# Patient Record
Sex: Female | Born: 1975
Health system: Southern US, Community
[De-identification: ages and names within clinical notes are randomized; demographics above are authoritative.]

## PROBLEM LIST (undated history)

## (undated) DIAGNOSIS — K219 Gastro-esophageal reflux disease without esophagitis: Secondary | ICD-10-CM

## (undated) DIAGNOSIS — E785 Hyperlipidemia, unspecified: Secondary | ICD-10-CM

## (undated) DIAGNOSIS — K760 Fatty (change of) liver, not elsewhere classified: Secondary | ICD-10-CM

## (undated) DIAGNOSIS — Z202 Contact with and (suspected) exposure to infections with a predominantly sexual mode of transmission: Secondary | ICD-10-CM

## (undated) DIAGNOSIS — J45909 Unspecified asthma, uncomplicated: Secondary | ICD-10-CM

## (undated) DIAGNOSIS — E119 Type 2 diabetes mellitus without complications: Secondary | ICD-10-CM

## (undated) DIAGNOSIS — B977 Papillomavirus as the cause of diseases classified elsewhere: Secondary | ICD-10-CM

## (undated) DIAGNOSIS — K5792 Diverticulitis of intestine, part unspecified, without perforation or abscess without bleeding: Secondary | ICD-10-CM

## (undated) DIAGNOSIS — I10 Essential (primary) hypertension: Secondary | ICD-10-CM

## (undated) DIAGNOSIS — T7840XA Allergy, unspecified, initial encounter: Secondary | ICD-10-CM

## (undated) DIAGNOSIS — R112 Nausea with vomiting, unspecified: Secondary | ICD-10-CM

## (undated) DIAGNOSIS — M1990 Unspecified osteoarthritis, unspecified site: Secondary | ICD-10-CM

## (undated) DIAGNOSIS — N83209 Unspecified ovarian cyst, unspecified side: Secondary | ICD-10-CM

## (undated) DIAGNOSIS — M797 Fibromyalgia: Secondary | ICD-10-CM

## (undated) DIAGNOSIS — R5382 Chronic fatigue, unspecified: Secondary | ICD-10-CM

## (undated) DIAGNOSIS — R42 Dizziness and giddiness: Secondary | ICD-10-CM

## (undated) DIAGNOSIS — A64 Unspecified sexually transmitted disease: Secondary | ICD-10-CM

## (undated) DIAGNOSIS — F419 Anxiety disorder, unspecified: Secondary | ICD-10-CM

## (undated) DIAGNOSIS — G8929 Other chronic pain: Secondary | ICD-10-CM

## (undated) DIAGNOSIS — E282 Polycystic ovarian syndrome: Secondary | ICD-10-CM

## (undated) DIAGNOSIS — F32A Depression, unspecified: Secondary | ICD-10-CM

## (undated) DIAGNOSIS — F319 Bipolar disorder, unspecified: Secondary | ICD-10-CM

## (undated) DIAGNOSIS — G43909 Migraine, unspecified, not intractable, without status migrainosus: Secondary | ICD-10-CM

## (undated) DIAGNOSIS — F329 Major depressive disorder, single episode, unspecified: Secondary | ICD-10-CM

## (undated) DIAGNOSIS — R87619 Unspecified abnormal cytological findings in specimens from cervix uteri: Secondary | ICD-10-CM

## (undated) HISTORY — PX: GASTRIC BYPASS: SHX52

## (undated) HISTORY — DX: Type 2 diabetes mellitus without complications: E11.9

## (undated) HISTORY — DX: Unspecified abnormal cytological findings in specimens from cervix uteri: R87.619

## (undated) HISTORY — DX: Unspecified sexually transmitted disease: A64

## (undated) HISTORY — DX: Bipolar disorder, unspecified: F31.9

## (undated) HISTORY — DX: Anxiety disorder, unspecified: F41.9

## (undated) HISTORY — DX: Unspecified osteoarthritis, unspecified site: M19.90

## (undated) HISTORY — DX: Other chronic pain: G89.29

## (undated) HISTORY — DX: Gastro-esophageal reflux disease without esophagitis: K21.9

## (undated) HISTORY — DX: Essential (primary) hypertension: I10

## (undated) HISTORY — DX: Papillomavirus as the cause of diseases classified elsewhere: B97.7

## (undated) HISTORY — DX: Polycystic ovarian syndrome: E28.2

## (undated) HISTORY — DX: Unspecified asthma, uncomplicated: J45.909

## (undated) HISTORY — DX: Contact with and (suspected) exposure to infections with a predominantly sexual mode of transmission: Z20.2

## (undated) HISTORY — DX: Allergy, unspecified, initial encounter: T78.40XA

## (undated) HISTORY — DX: Diverticulitis of intestine, part unspecified, without perforation or abscess without bleeding: K57.92

## (undated) HISTORY — PX: KNEE ARTHROSCOPY: SHX127

## (undated) HISTORY — DX: Depression, unspecified: F32.A

## (undated) HISTORY — PX: TOTAL HIP ARTHROPLASTY: SHX124

## (undated) HISTORY — DX: Migraine, unspecified, not intractable, without status migrainosus: G43.909

## (undated) HISTORY — PX: OTHER SURGICAL HISTORY: SHX169

## (undated) HISTORY — DX: Hyperlipidemia, unspecified: E78.5

## (undated) HISTORY — PX: BOWEL RESECTION: SHX1257

---
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
## 2014-02-12 DIAGNOSIS — G4730 Sleep apnea, unspecified: Secondary | ICD-10-CM

## 2014-02-12 HISTORY — PX: GASTRIC BYPASS: SHX52

## 2014-02-12 HISTORY — DX: Sleep apnea, unspecified: G47.30

## 2016-10-06 ENCOUNTER — Encounter: Payer: Self-pay | Admitting: Family Medicine

## 2017-11-02 ENCOUNTER — Emergency Department (HOSPITAL_COMMUNITY)
Admission: EM | Admit: 2017-11-02 | Discharge: 2017-11-03 | Disposition: A | Discharge status: Home / Self Care | Payer: Self-pay | Attending: Emergency Medicine | Admitting: Emergency Medicine

## 2017-11-02 ENCOUNTER — Other Ambulatory Visit: Payer: Self-pay

## 2017-11-02 ENCOUNTER — Encounter (HOSPITAL_COMMUNITY): Payer: Self-pay

## 2017-11-02 DIAGNOSIS — Z7984 Long term (current) use of oral hypoglycemic drugs: Secondary | ICD-10-CM | POA: Insufficient documentation

## 2017-11-02 DIAGNOSIS — N3000 Acute cystitis without hematuria: Secondary | ICD-10-CM | POA: Insufficient documentation

## 2017-11-02 DIAGNOSIS — E119 Type 2 diabetes mellitus without complications: Secondary | ICD-10-CM | POA: Insufficient documentation

## 2017-11-02 DIAGNOSIS — Z79899 Other long term (current) drug therapy: Secondary | ICD-10-CM | POA: Insufficient documentation

## 2017-11-02 HISTORY — DX: Type 2 diabetes mellitus without complications: E11.9

## 2017-11-02 LAB — PREGNANCY, URINE: PREG TEST UR: NEGATIVE

## 2017-11-02 NOTE — ED Triage Notes (Signed)
Patient was treated herself about a week ago for a yeast infection; then followed up with her provider whom prescribed Diflucan. States that the itching is better, but now she is "burning" when voiding.

## 2017-11-02 NOTE — ED Provider Notes (Signed)
[facility] Provider Note   CSN: [id number] Arrival date & time: 11/02/17  2157     History   Chief Complaint Chief Complaint  Patient presents with  . Dysuria    HPI Heidi Holland is a 42 y.o. female.  Patient with history of NIDT2DM presents with painful urination for several days. She states she has had symptoms of yeast infection for the past 10 days that was no better with OTC vaginal creams and Diflucan use. She continues to have vaginal burning without discharge, and dysuria that started 2-3 days ago. She vomited x 1 today and has noticed onset of chills at home. No respiratory symptoms. Her last bowel movement was 4 days ago and she states that is not unusual. No flank pain. She reports mild right lower abdominal/pelvic discomfort for 2-3 days as well.   The history is provided by the patient. No language interpreter was used.    Past Medical History:  Diagnosis Date  . Diabetes mellitus without complication (HCC)     There are no active problems to display for this patient.   Past Surgical History:  Procedure Laterality Date  . BOWEL RESECTION    . GASTRIC BYPASS       OB History   None      Home Medications    Prior to Admission medications   Medication Sig Start Date End Date Taking? Authorizing Provider  acetaminophen (TYLENOL) 325 MG tablet Take 650 mg by mouth every 6 (six) hours as needed for mild pain.   Yes [provider]  butalbital-acetaminophen-caffeine (FIORICET, ESGIC) 50-325-40 MG tablet Take 1 tablet by mouth 2 (two) times daily as needed for headache.   Yes [provider]  metFORMIN (GLUCOPHAGE) 1000 MG tablet Take 1,000 mg by mouth 2 (two) times daily with a meal.   Yes [provider]  misoprostol (CYTOTEC) 100 MCG tablet Take 100 mcg by mouth 4 (four) times daily.   Yes [provider]  montelukast (SINGULAIR) 10 MG tablet Take 10 mg by mouth at bedtime.   Yes  [provider]  pantoprazole (PROTONIX) 40 MG tablet Take 40 mg by mouth every evening.   Yes [provider]  phenazopyridine (PYRIDIUM) 95 MG tablet Take 95 mg by mouth 3 (three) times daily as needed for pain.   Yes [provider]  QUEtiapine (SEROQUEL) 400 MG tablet Take 400 mg by mouth 2 (two) times daily.   Yes [provider]  rosuvastatin (CRESTOR) 40 MG tablet Take 40 mg by mouth daily.   Yes [provider]  SUMAtriptan (IMITREX) 100 MG tablet Take 100 mg by mouth every 2 (two) hours as needed for migraine. May repeat in 2 hours if headache persists or recurs.   Yes [provider]    Family History History reviewed. No pertinent family history.  Social History Social History   Tobacco Use  . Smoking status: Never Smoker  . Smokeless tobacco: Never Used  Substance Use Topics  . Alcohol use: Never    Frequency: Never  . Drug use: Never     Allergies   Ciprofloxacin   Review of Systems Review of Systems  Constitutional: Positive for chills.  HENT: Negative.   Respiratory: Negative.  Negative for cough and shortness of breath.   Cardiovascular: Negative.   Gastrointestinal: Positive for abdominal pain (Mild RLQ/pelvic discomfort), constipation and vomiting.  Genitourinary: Positive for dysuria, frequency, pelvic pain and vaginal pain. Negative for flank pain,  vaginal bleeding and vaginal discharge.  Musculoskeletal: Negative.  Negative for back pain.  Neurological: Negative.  Negative for weakness.     Physical Exam Updated Vital Signs BP (!) 156/81   Pulse 94   Temp 99.2 F (37.3 C) (Oral)   Resp 18   Ht 5\' 3"  (1.6 m)   Wt 95.3 kg   LMP 09/29/2017 (Exact Date)   SpO2 99%   BMI 37.20 kg/m   Physical Exam  Constitutional: She is oriented to person, place, and time. She appears well-developed and well-nourished.  HENT:  Head: Normocephalic.  Neck: Normal range of motion. Neck supple.    Cardiovascular: Normal rate.  Pulmonary/Chest: Effort normal.  Abdominal: Soft. Bowel sounds are normal. There is no tenderness. There is no rebound and no guarding.  Musculoskeletal: Normal range of motion.  Neurological: She is alert and oriented to person, place, and time.  Skin: Skin is warm and dry. No rash noted.  Psychiatric: She has a normal mood and affect.     ED Treatments / Results  Labs (all labs ordered are listed, but only abnormal results are displayed) Labs Reviewed  URINALYSIS, ROUTINE W REFLEX MICROSCOPIC  PREGNANCY, URINE    EKG None  Radiology No results found.  Procedures Procedures (including critical care time)  Medications Ordered in ED Medications - No data to display   Initial Impression / Assessment and Plan / ED Course  I have reviewed the triage vital signs and the nursing notes.  Pertinent labs & imaging results that were available during my care of the patient were reviewed by me and considered in my medical decision making (see chart for details).     Patient here with recent yeast infection now having dysuria, vaginal burning, low grade temperature and vomiting x 1 today. She states her blood sugars have been stable.   UA Is positive for infection. Offered pelvic exam for further evaluation of vaginal burning and patient declined. Feel her symptoms can be explained by finding of UTI. Start on Keflex. Rx Diflucan provided for when she is finished with her antibiotic.   She can be discharged home. Resources for primary care provider provided to patient.   Final Clinical Impressions(s) / ED Diagnoses   Final diagnoses:  None   1. UTI  ED Discharge Orders    None       Elpidio Anis, PA-C 11/03/17 1610    Alvira Monday, MD 11/03/17 1316

## 2017-11-03 LAB — URINALYSIS, ROUTINE W REFLEX MICROSCOPIC
BILIRUBIN URINE: NEGATIVE
Glucose, UA: NEGATIVE mg/dL
KETONES UR: NEGATIVE mg/dL
Nitrite: POSITIVE — AB
PH: 5 (ref 5.0–8.0)
Protein, ur: NEGATIVE mg/dL
SPECIFIC GRAVITY, URINE: 1.018 (ref 1.005–1.030)

## 2017-11-03 LAB — CBG MONITORING, ED: GLUCOSE-CAPILLARY: 142 mg/dL — AB (ref 70–99)

## 2017-11-03 MED ORDER — ONDANSETRON 4 MG PO TBDP
4.0000 mg | ORAL_TABLET | Freq: Once | ORAL | Status: AC
  Administered 2017-11-03: 4 mg via ORAL
  Filled 2017-11-03: qty 1

## 2017-11-03 MED ORDER — FLUCONAZOLE 150 MG PO TABS: 150.0000 mg | ORAL_TABLET | Freq: Once | ORAL | 1 refills | Status: AC

## 2017-11-03 MED ORDER — CEPHALEXIN 500 MG PO CAPS: 1000.0000 mg | ORAL_CAPSULE | Freq: Two times a day (BID) | ORAL | 0 refills | Status: DC

## 2017-11-03 MED ORDER — ONDANSETRON 4 MG PO TBDP: 4.0000 mg | ORAL_TABLET | Freq: Three times a day (TID) | ORAL | 0 refills | Status: DC | PRN

## 2017-11-03 MED ORDER — CEPHALEXIN 250 MG PO CAPS
1000.0000 mg | ORAL_CAPSULE | Freq: Once | ORAL | Status: AC
  Administered 2017-11-03: 1000 mg via ORAL
  Filled 2017-11-03: qty 4

## 2017-11-03 MED ORDER — KETOROLAC TROMETHAMINE 30 MG/ML IJ SOLN
30.0000 mg | Freq: Once | INTRAMUSCULAR | Status: AC
  Administered 2017-11-03: 30 mg via INTRAMUSCULAR
  Filled 2017-11-03: qty 1

## 2017-11-03 NOTE — ED Notes (Signed)
Reviewed d/c instructions with pt, who verbalized understanding and had no outstanding questions. Pt departed in NAD, refused use of wheelchair.   

## 2017-11-03 NOTE — Discharge Instructions (Signed)
Take Keflex twice daily until gone for urinary tract infection. Wait until you have finished the Keflex to take the Diflucan if you develop symptoms of yeast infection. Follow up with your choice of primary care providers for routine medical management. A list of providers has been provided for your convenience. Return to the emergency department with any worsening symptoms or new concerns.

## 2018-03-28 ENCOUNTER — Emergency Department (HOSPITAL_COMMUNITY)
Admission: EM | Admit: 2018-03-28 | Discharge: 2018-03-29 | Disposition: A | Discharge status: Home / Self Care | Payer: BLUE CROSS/BLUE SHIELD | Attending: Emergency Medicine | Admitting: Emergency Medicine

## 2018-03-28 ENCOUNTER — Emergency Department (HOSPITAL_COMMUNITY): Payer: BLUE CROSS/BLUE SHIELD

## 2018-03-28 ENCOUNTER — Other Ambulatory Visit: Payer: Self-pay

## 2018-03-28 DIAGNOSIS — F410 Panic disorder [episodic paroxysmal anxiety] without agoraphobia: Secondary | ICD-10-CM | POA: Diagnosis not present

## 2018-03-28 DIAGNOSIS — Z79899 Other long term (current) drug therapy: Secondary | ICD-10-CM | POA: Insufficient documentation

## 2018-03-28 DIAGNOSIS — E119 Type 2 diabetes mellitus without complications: Secondary | ICD-10-CM | POA: Diagnosis not present

## 2018-03-28 DIAGNOSIS — R0789 Other chest pain: Secondary | ICD-10-CM | POA: Diagnosis not present

## 2018-03-28 DIAGNOSIS — Z7984 Long term (current) use of oral hypoglycemic drugs: Secondary | ICD-10-CM | POA: Diagnosis not present

## 2018-03-28 DIAGNOSIS — R079 Chest pain, unspecified: Secondary | ICD-10-CM

## 2018-03-28 LAB — BASIC METABOLIC PANEL
ANION GAP: 10 (ref 5–15)
BUN: 10 mg/dL (ref 6–20)
CO2: 22 mmol/L (ref 22–32)
Calcium: 9.8 mg/dL (ref 8.9–10.3)
Chloride: 105 mmol/L (ref 98–111)
Creatinine, Ser: 0.68 mg/dL (ref 0.44–1.00)
GFR calc Af Amer: >60 mL/min (ref >60)
Glucose, Bld: 191 mg/dL — ABNORMAL HIGH (ref 70–99)
POTASSIUM: 4.2 mmol/L (ref 3.5–5.1)
SODIUM: 137 mmol/L (ref 135–145)

## 2018-03-28 LAB — CBC
HCT: 35.3 % — ABNORMAL LOW (ref 36.0–46.0)
HEMOGLOBIN: 10.4 g/dL — AB (ref 12.0–15.0)
MCH: 20.8 pg — ABNORMAL LOW (ref 26.0–34.0)
MCHC: 29.5 g/dL — ABNORMAL LOW (ref 30.0–36.0)
MCV: 70.5 fL — ABNORMAL LOW (ref 80.0–100.0)
Platelets: 452 10*3/uL — ABNORMAL HIGH (ref 150–400)
RBC: 5.01 MIL/uL (ref 3.87–5.11)
RDW: 16.1 % — ABNORMAL HIGH (ref 11.5–15.5)
WBC: 13.3 10*3/uL — AB (ref 4.0–10.5)
nRBC: 0 % (ref 0.0–0.2)

## 2018-03-28 LAB — I-STAT BETA HCG BLOOD, ED (MC, WL, AP ONLY): I-stat hCG, quantitative: <5 m[IU]/mL (ref <5)

## 2018-03-28 LAB — I-STAT TROPONIN, ED: TROPONIN I, POC: 0 ng/mL (ref 0.00–0.08)

## 2018-03-28 MED ORDER — SODIUM CHLORIDE 0.9% FLUSH: 3.0000 mL | Freq: Once | INTRAVENOUS | Status: DC

## 2018-03-28 NOTE — ED Triage Notes (Signed)
Pt states she has had non radiating chest pain and shob since yesterday. Non radiating. Some nausea. Tearful.

## 2018-03-29 LAB — I-STAT TROPONIN, ED: TROPONIN I, POC: 0.01 ng/mL (ref 0.00–0.08)

## 2018-03-29 MED ORDER — HYDROXYZINE HCL 25 MG PO TABS: 25.0000 mg | ORAL_TABLET | Freq: Four times a day (QID) | ORAL | 0 refills | Status: DC

## 2018-03-29 MED ORDER — LORAZEPAM 1 MG PO TABS
1.0000 mg | ORAL_TABLET | Freq: Once | ORAL | Status: AC
  Administered 2018-03-29: 1 mg via ORAL
  Filled 2018-03-29: qty 1

## 2018-03-29 NOTE — ED Provider Notes (Signed)
[facility] EMERGENCY DEPARTMENT Provider Note   CSN: [id number] Arrival date & time: 03/28/18  2223     History   Chief Complaint Chief Complaint  Patient presents with  . Chest Pain    HPI Heidi Holland is a 43 y.o. female.  Patient presents to the emergency department with a chief complaint of chest pain or shortness of breath.  She states that the symptoms have happened 3 times over the past 2 days.  She states that she has anxiety, and states that this feels similar to panic attack.  States that she has not been sleeping well has been having night terrors.  She denies any chest pain or shortness of breath now.  She states that she has been off of her psych meds since moving to [facility], she is trying to reestablish care here.  The history is provided by the patient. No language interpreter was used.    Past Medical History:  Diagnosis Date  . Diabetes mellitus without complication (HCC)     There are no active problems to display for this patient.   Past Surgical History:  Procedure Laterality Date  . BOWEL RESECTION    . GASTRIC BYPASS       OB History   No obstetric history on file.      Home Medications    Prior to Admission medications   Medication Sig Start Date End Date Taking? Authorizing Provider  acetaminophen (TYLENOL) 325 MG tablet Take 650 mg by mouth every 6 (six) hours as needed for mild pain.    [provider]  butalbital-acetaminophen-caffeine (FIORICET, ESGIC) 50-325-40 MG tablet Take 1 tablet by mouth 2 (two) times daily as needed for headache.    [provider]  cephALEXin (KEFLEX) 500 MG capsule Take 2 capsules (1,000 mg total) by mouth 2 (two) times daily. 11/03/17   Elpidio Anis, PA-C  metFORMIN (GLUCOPHAGE) 1000 MG tablet Take 1,000 mg by mouth 2 (two) times daily with a meal.    [provider]  misoprostol (CYTOTEC) 100 MCG tablet Take 100 mcg by mouth 4 (four) times daily.    [provider]  montelukast (SINGULAIR) 10 MG tablet Take 10 mg by mouth at bedtime.    [provider]  ondansetron (ZOFRAN ODT) 4 MG disintegrating tablet Take 1 tablet (4 mg total) by mouth every 8 (eight) hours as needed for nausea or vomiting. 11/03/17   Elpidio Anis, PA-C  pantoprazole (PROTONIX) 40 MG tablet Take 40 mg by mouth every evening.    [provider]  phenazopyridine (PYRIDIUM) 95 MG tablet Take 95 mg by mouth 3 (three) times daily as needed for pain.    [provider]  QUEtiapine (SEROQUEL) 400 MG tablet Take 400 mg by mouth 2 (two) times daily.    [provider]  rosuvastatin (CRESTOR) 40 MG tablet Take 40 mg by mouth daily.    [provider]  SUMAtriptan (IMITREX) 100 MG tablet Take 100 mg by mouth every 2 (two) hours as needed for migraine. May repeat in 2 hours if headache persists or recurs.    [provider]    Family History No family history on file.  Social History Social History   Tobacco Use  . Smoking status: Never Smoker  . Smokeless tobacco: Never Used  Substance Use Topics  . Alcohol use: Never    Frequency: Never  . Drug use: Never     Allergies   Ciprofloxacin   Review  of Systems Review of Systems  All other systems reviewed and are negative.    Physical Exam Updated Vital Signs BP (!) 141/91   Pulse 90   Temp 98.2 F (36.8 C) (Oral)   Resp 13   SpO2 100%   Physical Exam Vitals signs and nursing note reviewed.  Constitutional:      Appearance: She is well-developed.  HENT:     Head: Normocephalic and atraumatic.  Eyes:     Conjunctiva/sclera: Conjunctivae normal.     Pupils: Pupils are equal, round, and reactive to light.  Neck:     Musculoskeletal: Normal range of motion and neck supple.  Cardiovascular:     Rate and Rhythm: Normal rate and regular rhythm.     Heart sounds: No murmur. No friction rub. No gallop.   Pulmonary:     Effort: Pulmonary effort is  normal. No respiratory distress.     Breath sounds: Normal breath sounds. No wheezing or rales.  Chest:     Chest wall: No tenderness.  Abdominal:     General: Bowel sounds are normal. There is no distension.     Palpations: Abdomen is soft. There is no mass.     Tenderness: There is no abdominal tenderness. There is no guarding or rebound.  Musculoskeletal: Normal range of motion.        General: No tenderness.  Skin:    General: Skin is warm and dry.  Neurological:     Mental Status: She is alert and oriented to person, place, and time.  Psychiatric:        Behavior: Behavior normal.        Thought Content: Thought content normal.        Judgment: Judgment normal.      ED Treatments / Results  Labs (all labs ordered are listed, but only abnormal results are displayed) Labs Reviewed  BASIC METABOLIC PANEL - Abnormal; Notable for the following components:      Result Value   Glucose, Bld 191 (*)    All other components within normal limits  CBC - Abnormal; Notable for the following components:   WBC 13.3 (*)    Hemoglobin 10.4 (*)    HCT 35.3 (*)    MCV 70.5 (*)    MCH 20.8 (*)    MCHC 29.5 (*)    RDW 16.1 (*)    Platelets 452 (*)    All other components within normal limits  I-STAT TROPONIN, ED  I-STAT BETA HCG BLOOD, ED ([facility], [facility], [facility] ONLY)  I-STAT TROPONIN, ED    EKG EKG Interpretation  Date/Time:  Friday March 28 2018 22:30:11 EST Ventricular Rate:  104 PR Interval:  140 QRS Duration: 68 QT Interval:  330 QTC Calculation: 433 R Axis:   59 Text Interpretation:  Sinus tachycardia Otherwise normal ECG No previous ECGs available Confirmed by Zadie RhineWickline, Donald ([id number]) on 03/29/2018 2:47:51 AM   Radiology Dg Chest 2 View  Result Date: 03/28/2018 CLINICAL DATA:  Nonradiating chest pain and dyspnea since yesterday. EXAM: CHEST - 2 VIEW COMPARISON:  None. FINDINGS: Cardiomegaly with mild aortic atherosclerosis. No acute pulmonary edema, consolidation, effusion or  pneumothorax. No acute nor suspicious osseous abnormality. IMPRESSION: Cardiomegaly with mild aortic atherosclerosis. No active pulmonary disease. Electronically Signed   By: Tollie Ethavid  Kwon M.D.   On: 03/28/2018 23:04    Procedures Procedures (including critical care time)  Medications Ordered in ED Medications  sodium chloride flush (NS) 0.9 % injection 3 mL (has no administration  in time range)     Initial Impression / Assessment and Plan / ED Course  I have reviewed the triage vital signs and the nursing notes.  Pertinent labs & imaging results that were available during my care of the patient were reviewed by me and considered in my medical decision making (see chart for details).     Patient was symptoms that seem consistent with panic attacks.  She has had 3 episodes over the past 2 days.  She is also not been sleeping well has been having night terrors, and has felt anxious.  She has been off of her medications for a couple of months.  I have encouraged her to follow-up at [facility].  I have given her a dose of 1 mg of Ativan here for her symptoms and will discharge her home with some Atarax.  I do not feel that there is any ACS component of her symptoms.  Troponin and delta troponin are negative.  Laboratory work-up is remarkable.  She does have a nonspecific leukocytosis and mild anemia.  Final Clinical Impressions(s) / ED Diagnoses   Final diagnoses:  Panic attack  Nonspecific chest pain    ED Discharge Orders         Ordered    hydrOXYzine (ATARAX/VISTARIL) 25 MG tablet  Every 6 hours     03/29/18 0418           Roxy Horseman, PA-C 03/29/18 Shonna Chock, MD 03/29/18 [phone]

## 2018-03-29 NOTE — ED Notes (Signed)
This RN out to lobby to reassure pt. Pt is working on controlling her breathing and feels better at this time. Breathing controlled. Reassured.

## 2018-03-30 DIAGNOSIS — F411 Generalized anxiety disorder: Secondary | ICD-10-CM | POA: Diagnosis not present

## 2018-04-01 DIAGNOSIS — F4310 Post-traumatic stress disorder, unspecified: Secondary | ICD-10-CM | POA: Diagnosis not present

## 2018-04-04 DIAGNOSIS — I10 Essential (primary) hypertension: Secondary | ICD-10-CM | POA: Diagnosis not present

## 2018-04-04 DIAGNOSIS — E782 Mixed hyperlipidemia: Secondary | ICD-10-CM | POA: Diagnosis not present

## 2018-04-04 DIAGNOSIS — E1169 Type 2 diabetes mellitus with other specified complication: Secondary | ICD-10-CM | POA: Diagnosis not present

## 2018-04-04 DIAGNOSIS — F4310 Post-traumatic stress disorder, unspecified: Secondary | ICD-10-CM | POA: Diagnosis not present

## 2018-04-04 DIAGNOSIS — Z23 Encounter for immunization: Secondary | ICD-10-CM | POA: Diagnosis not present

## 2018-04-04 DIAGNOSIS — Z9884 Bariatric surgery status: Secondary | ICD-10-CM | POA: Diagnosis not present

## 2018-04-04 DIAGNOSIS — F419 Anxiety disorder, unspecified: Secondary | ICD-10-CM | POA: Diagnosis not present

## 2018-04-11 DIAGNOSIS — F419 Anxiety disorder, unspecified: Secondary | ICD-10-CM | POA: Diagnosis not present

## 2018-04-11 DIAGNOSIS — F332 Major depressive disorder, recurrent severe without psychotic features: Secondary | ICD-10-CM | POA: Diagnosis not present

## 2018-04-11 DIAGNOSIS — E782 Mixed hyperlipidemia: Secondary | ICD-10-CM | POA: Diagnosis not present

## 2018-04-11 DIAGNOSIS — E1169 Type 2 diabetes mellitus with other specified complication: Secondary | ICD-10-CM | POA: Diagnosis not present

## 2018-04-14 ENCOUNTER — Encounter: Payer: Self-pay | Admitting: Neurology

## 2018-04-16 DIAGNOSIS — F4310 Post-traumatic stress disorder, unspecified: Secondary | ICD-10-CM | POA: Diagnosis not present

## 2018-04-28 DIAGNOSIS — F4310 Post-traumatic stress disorder, unspecified: Secondary | ICD-10-CM | POA: Diagnosis not present

## 2018-04-30 DIAGNOSIS — F3341 Major depressive disorder, recurrent, in partial remission: Secondary | ICD-10-CM | POA: Diagnosis not present

## 2018-04-30 DIAGNOSIS — F4310 Post-traumatic stress disorder, unspecified: Secondary | ICD-10-CM | POA: Diagnosis not present

## 2018-04-30 DIAGNOSIS — F419 Anxiety disorder, unspecified: Secondary | ICD-10-CM | POA: Diagnosis not present

## 2018-05-02 ENCOUNTER — Other Ambulatory Visit: Payer: Self-pay

## 2018-05-02 ENCOUNTER — Ambulatory Visit (INDEPENDENT_AMBULATORY_CARE_PROVIDER_SITE_OTHER): Payer: BLUE CROSS/BLUE SHIELD | Admitting: Neurology

## 2018-05-02 ENCOUNTER — Encounter: Payer: Self-pay | Admitting: Neurology

## 2018-05-02 VITALS — BP 100/70 | HR 75 | Temp 98.2°F | Ht 63.0 in | Wt 211.2 lb

## 2018-05-02 DIAGNOSIS — G43709 Chronic migraine without aura, not intractable, without status migrainosus: Secondary | ICD-10-CM

## 2018-05-02 DIAGNOSIS — G8929 Other chronic pain: Secondary | ICD-10-CM | POA: Diagnosis not present

## 2018-05-02 DIAGNOSIS — M545 Low back pain, unspecified: Secondary | ICD-10-CM

## 2018-05-02 DIAGNOSIS — M542 Cervicalgia: Secondary | ICD-10-CM

## 2018-05-02 MED ORDER — ERENUMAB-AOOE 70 MG/ML ~~LOC~~ SOAJ: 70.0000 mg | SUBCUTANEOUS | 11 refills | Status: DC

## 2018-05-02 MED ORDER — TIZANIDINE HCL 2 MG PO TABS: 2.0000 mg | ORAL_TABLET | ORAL | 0 refills | Status: DC

## 2018-05-02 MED ORDER — ERENUMAB-AOOE 70 MG/ML ~~LOC~~ SOAJ: 70.0000 mg | Freq: Once | SUBCUTANEOUS | 0 refills | Status: AC

## 2018-05-02 MED ORDER — TIZANIDINE HCL 2 MG PO TABS: ORAL_TABLET | ORAL | 3 refills | Status: DC

## 2018-05-02 MED ORDER — SUMATRIPTAN 10 MG/ACT NA SOLN: 1.0000 | NASAL | 3 refills | Status: DC | PRN

## 2018-05-02 MED ORDER — SUMATRIPTAN 10 MG/ACT NA SOLN: 1.0000 | NASAL | 0 refills | Status: DC

## 2018-05-02 MED ORDER — ONDANSETRON 4 MG PO TBDP: 4.0000 mg | ORAL_TABLET | Freq: Three times a day (TID) | ORAL | 0 refills | Status: DC | PRN

## 2018-05-02 MED ORDER — SUMATRIPTAN 10 MG/ACT NA SOLN: 1.0000 | NASAL | 1 refills | Status: DC | PRN

## 2018-05-02 NOTE — Progress Notes (Addendum)
NEUROLOGY CONSULTATION NOTE  Heidi Holland MRN: [id number] DOB: 05-30-1975  Referring provider: Levonne Lapping, NP Primary care provider: Levonne Lapping, NP  Reason for consult:  headache  HISTORY OF PRESENT ILLNESS: Heidi Holland is a 43 year old right-handed Caucasian woman with hypertension, hyperlipidemia, type 2 diabetes, PTSD/anxiety/depression, chronic low back pain and history of TIA who presents for headaches.  History supplemented by referring provider note.  Onset:  Age 7 Location: occiput into neck or top of head Quality:  Pressure, throbbing Intensity:  8/10.  She denies new headache, thunderclap headache  Aura:  no Premonitory Phase:  Wakes up with heavy eyelids and stiff neck. Postdrome:  "hangover effect" - exhausted, irritable, neck soreness Associated symptoms:  Nausea, sometimes vomiting, photophobia, phonophobia, blurred vision.  She denies associated unilateral numbness or weakness. Duration:  Usually 2-3 days (up to a week with various intensity) Frequency:  6 times a month (15 days or more a month) Frequency of abortive medication: something daily Triggers:  Lack of coffee, menstrual period, when low back pain aggravated, perfumes Relieving factors:  Ice pack, lay in dark Activity:  aggravates She has been to the ED on a couple of occasions for severe intractable migraine.  Rescue therapy:  1.  Advil, 2.  Fioricet three hours later, 3. Maxalt the next day Current NSAIDS:  ASA 325mg  Current analgesics:  Fioricet Current triptans:  Rizatriptan 10mg  Current ergotamine:  none Current anti-emetic:  none Current muscle relaxants:  Tizanidine 4mg  PRN Current anti-anxiolytic:  Clonazepam, hydroxyzine Current sleep aide:  trazodone Current Antihypertensive medications:  hydroxyzine Current Antidepressant medications:  Trintellix, Rexulti Current Anticonvulsant medications:  Gabapentin 600mg  three times daily Current anti-CGRP:  none Current  Vitamins/Herbal/Supplements:  none Current Antihistamines/Decongestants:  none Other therapy:  none Hormone/birth control:  none Other medications:  Seroquel  Past NSAIDS:  Ibuprofen, Aleve Past analgesics:  Excedrin, Tylenol Past abortive triptans:  Sumatriptan tablet Past abortive ergotamine:  none Past muscle relaxants:  Flexeril, Robaxin Past anti-emetic:  Zofran, Phenergan Past antihypertensive medications:  "a blood pressure medication" Past antidepressant medications:  Effexor Past anticonvulsant medications:  topiramate Past anti-CGRP:  None Past vitamins/Herbal/Supplements:  none Past antihistamines/decongestants:  none Other past therapies:  Botox (effective)  She was told by her previous PCP that she had a transient ischemic attack in early 2019.  She was walking and her legs suddenly gave out and her arms weren't working.  She was on the floor for a few minutes.  No unilateral numbness or weakness.  When she got up, she called a friend who tested her on the phone for TIA and symptoms resolved.  She had an MRI and MRA of the brain about a month later, on 07/24/17, which report states were normal.  She was given a diagnosis of TIA and has since been on ASA 325mg  daily.  Her mother has a history of recurrent TIAs while she was treated for lung cancer.  Caffeine:  1 venti cup of coffee daily, 2 cups of diet Coke daily Alcohol:  no Smoker:  no Diet:  Needs to improve water intake.  Skips meals.  Diet Coke daily Exercise:  Not routine Depression:  Poor.  Currently treated; Anxiety:  Poor.  Currently treated. Other pain:  Low back pain/lumbar radiculopathy Sleep hygiene:  Improved on medication Family history of headache:  no  04/04/18 LABS:  CBC with WBC 9.5, HGB 10.4, HCT 32.5, PLT 423  PAST MEDICAL HISTORY: Type 2 diabetes mellitus Hypertension Migraine PTSD/anxiety/depression Asthma, seasonally  Low  back pain  PAST SURGICAL HISTORY: Past Surgical History:  Procedure  Laterality Date  . BOWEL RESECTION    . GASTRIC BYPASS      MEDICATIONS: Clonazepam 0.5mg  Gabapentin 300mg  Hydroxyzine 25mg  Quetiapine ER 400mg   ALLERGIES: Allergies  Allergen Reactions  . Ciprofloxacin Itching    FAMILY HISTORY: Father:  Small cell lung cancer, CAD Mother:  Stage 4 lung cancer Paternal grandmother:  PE Maternal grandfather: lung cancer Maternal grandmother:  diabetes  SOCIAL HISTORY: Social History   Socioeconomic History  . Marital status: Single    Spouse name: Not on file  . Number of children: Not on file  . Years of education: Not on file  . Highest education level: Not on file  Occupational History  . Not on file  Social Needs  . Financial resource strain: Not on file  . Food insecurity:    Worry: Not on file    Inability: Not on file  . Transportation needs:    Medical: Not on file    Non-medical: Not on file  Tobacco Use  . Smoking status: Never Smoker  . Smokeless tobacco: Never Used  Substance and Sexual Activity  . Alcohol use: Never    Frequency: Never  . Drug use: Never  . Sexual activity: Never  Lifestyle  . Physical activity:    Days per week: Not on file    Minutes per session: Not on file  . Stress: Not on file  Relationships  . Social connections:    Talks on phone: Not on file    Gets together: Not on file    Attends religious service: Not on file    Active member of club or organization: Not on file    Attends meetings of clubs or organizations: Not on file    Relationship status: Not on file  . Intimate partner violence:    Fear of current or ex partner: Not on file    Emotionally abused: Not on file    Physically abused: Not on file    Forced sexual activity: Not on file  Other Topics Concern  . Not on file  Social History Narrative  . Not on file    REVIEW OF SYSTEMS: Constitutional: No fevers, chills, or sweats, no generalized fatigue, change in appetite Eyes: No visual changes, double vision, eye  pain Ear, nose and throat: No hearing loss, ear pain, nasal congestion, sore throat Cardiovascular: No chest pain, palpitations Respiratory:  No shortness of breath at rest or with exertion, wheezes GastrointestinaI: No nausea, vomiting, diarrhea, abdominal pain, fecal incontinence Genitourinary:  No dysuria, urinary retention or frequency Musculoskeletal:  No neck pain, back pain Integumentary: No rash, pruritus, skin lesions Neurological: as above Psychiatric: No depression, insomnia, anxiety Endocrine: No palpitations, fatigue, diaphoresis, mood swings, change in appetite, change in weight, increased thirst Hematologic/Lymphatic:  No purpura, petechiae. Allergic/Immunologic: no itchy/runny eyes, nasal congestion, recent allergic reactions, rashes  PHYSICAL EXAM: Blood pressure 100/70, pulse 75, temperature 98.2 F (36.8 C), temperature source Oral, height 5\' 3"  (1.6 m), weight 211 lb 4 oz (95.8 kg), SpO2 97 %. General: No acute distress.  Patient appears well-groomed Head:  Normocephalic/atraumatic Eyes:  fundi examined but not visualized Neck: supple, no paraspinal tenderness, full range of motion Back: No paraspinal tenderness Heart: regular rate and rhythm Lungs: Clear to auscultation bilaterally. Vascular: No carotid bruits. Neurological Exam: Mental status: alert and oriented to person, place, and time, recent and remote memory intact, fund of knowledge intact, attention and concentration intact,  speech fluent and not dysarthric, language intact. Cranial nerves: CN I: not tested CN II: pupils equal, round and reactive to light, visual fields intact CN III, IV, VI:  full range of motion, no nystagmus, no ptosis CN V: facial sensation intact CN VII: upper and lower face symmetric CN VIII: hearing intact CN IX, X: gag intact, uvula midline CN XI: sternocleidomastoid and trapezius muscles intact CN XII: tongue midline Bulk & Tone: normal, no fasciculations. Motor:  5/5  throughout Sensation:  temperature and vibration sensation intact. Deep Tendon Reflexes:  2+ throughout,toes downgoing.   Finger to nose testing:  Without dysmetria.   Heel to shin:  Without dysmetria.   Gait:  Normal station and stride.  Able to turn and tandem walk. Romberg negative  IMPRESSION: 1.  Chronic migraine without aura, without status migrainosus, not intractable 2.  Reported history of TIA.  Based on her description of event, I don't believe that she had a TIA.  Etiology is unclear.  PLAN: 1.  Botox was previously effective but for preventative management, we will first try Aimovig  monthly 2.  Stop Fioricet, Maxalt, and Advil.  For abortive therapy, Tosymra nasal spray.  Zofran  for nausea. 3. For neck pain, tizanidine  in AM and afternoon PRN (caution for drowsiness advised) and  at bedtime. 4.  Limit use of pain relievers to no more than 2 days out of week to prevent risk of rebound or medication-overuse headache. 5.  Keep headache diary 6.  Exercise, hydration, caffeine cessation, sleep hygiene, monitor for and avoid triggers 7.  Consider:  magnesium citrate  daily, riboflavin  daily, and coenzyme Q10  three times daily 8. Will refer for osteopathic manipulative medicine/Sports Medicine for neck pain.  Refer to pain management for chronic back pain  9. As I do not believe that she had a TIA, she may discontinue ASA. 10. Follow up in 4 months.  Thank you for allowing me to take part in the care of this patient.  Shon Millet, DO  CC: Levonne Lapping, NP

## 2018-05-02 NOTE — Patient Instructions (Addendum)
Migraine Recommendations: 1.  Start Aimovig 70mg  injection monthly 2.  Take Tosymra 1 spray at earliest onset of headache.  May repeat dose every 1 hour if needed.  Do not exceed 3 sprays in 24 hours.  Ondansetron 4mg  for nausea as directed. 3.  Limit use of pain relievers to no more than 2 days out of the week.  These medications include acetaminophen, ibuprofen, triptans and narcotics.  This will help reduce risk of rebound headaches. 4.  Be aware of common food triggers such as processed sweets, processed foods with nitrites (such as deli meat, hot dogs, sausages), foods with MSG, alcohol (such as wine), chocolate, certain cheeses, certain fruits (dried fruits, bananas, pineapple), vinegar, diet soda. 4.  Avoid caffeine 5.  Routine exercise 6.  Proper sleep hygiene 7.  Stay adequately hydrated with water 8.  Keep a headache diary. 9.  Maintain proper stress management. 10.  Do not skip meals. 11.  Consider supplements:  Magnesium citrate 400mg  to 600mg  daily, riboflavin 400mg , Coenzyme Q 10 100mg  three times daily 12.  STOP FIORICET, RIZATRIPTAN, ADVIL 13.  For neck pain, may take tizanidine 2mg  in AM and afternoon (caution for drowsiness).  Take 4mg  at bedtime. 14.  Refer to Dr. Antoine Primas or Dr Gaspar Bidding for neck pain 15.  Refer to pain management for low back pain 16.  Follow up in 4 months. 17.  I don't think you had TIA.  May stop aspirin

## 2018-05-05 DIAGNOSIS — E559 Vitamin D deficiency, unspecified: Secondary | ICD-10-CM | POA: Diagnosis not present

## 2018-05-05 DIAGNOSIS — F419 Anxiety disorder, unspecified: Secondary | ICD-10-CM | POA: Diagnosis not present

## 2018-05-05 DIAGNOSIS — E1169 Type 2 diabetes mellitus with other specified complication: Secondary | ICD-10-CM | POA: Diagnosis not present

## 2018-05-05 DIAGNOSIS — F4310 Post-traumatic stress disorder, unspecified: Secondary | ICD-10-CM | POA: Diagnosis not present

## 2018-05-05 DIAGNOSIS — F332 Major depressive disorder, recurrent severe without psychotic features: Secondary | ICD-10-CM | POA: Diagnosis not present

## 2018-05-11 NOTE — Progress Notes (Signed)
[facility] [address] [place], [place] [address] Phone: [phone] Subjective:    I'm seeing this patient by the request  of:  Jaffe DO   CC: Neck and back pain   [id number]  Heidi Holland is a 43 y.o. female coming in with complaint of neck and back pain.  Past medical history significant for PTSD, anxiety and depression with chronic neck and back pain.  Did see neurology for also her chronic headaches.  Has had these headaches for 25 years. Pain radiates to the right hip. Has had injections that she believes made her pain worse. Was getting Botox for migraines in the cervical spine. Numbness and burning sensation in the thigh on the left side. Not supposed to take NSAIDs due to gastric bypass.  Patient has had what appeared to be facet injections based on the picture she showed me.  Onset- chronic Location lower back as well as neck Duration-  Character-dull, throbbing aching Aggravating factors- sitting Reliving factors- Ice, heat, topical, Zanaflex  Therapies tried-  Severity-8 out of 10.   Last plan with neurology  1.  Botox was previously effective but for preventative management, we will first try Aimovig  monthly 2.  Stop Fioricet, Maxalt, and Advil.  For abortive therapy, Tosymra nasal spray.  Zofran  for nausea. 3. For neck pain, tizanidine  in AM and afternoon PRN (caution for drowsiness advised) and  at bedtime. 4.  Limit use of pain relievers to no more than 2 days out of week to prevent risk of rebound or medication-overuse headache. 5.  Keep headache diary 6.  Exercise, hydration, caffeine cessation, sleep hygiene, monitor for and avoid triggers 7.  Consider:  magnesium citrate  daily, riboflavin  daily, and coenzyme Q10  three times daily 8. Will refer for osteopathic manipulative medicine/Sports Medicine for neck pain.  Refer to pain management for chronic back pain  9. As I do not believe that she  had a TIA, she may discontinue ASA. 10. Follow up in 4 months. Past Medical History:  Diagnosis Date  . Diabetes mellitus without complication [facility])    Past Surgical History:  Procedure Laterality Date  . BOWEL RESECTION    . GASTRIC BYPASS     Social History   Socioeconomic History  . Marital status: Single    Spouse name: Not on file  . Number of children: 0  . Years of education: 65  . Highest education level: Associate degree: occupational, Scientist, product/process development, or vocational program  Occupational History    Employer: [employer]  Social Needs  . Financial resource strain: Not on file  . Food insecurity:    Worry: Not on file    Inability: Not on file  . Transportation needs:    Medical: Not on file    Non-medical: Not on file  Tobacco Use  . Smoking status: Never Smoker  . Smokeless tobacco: Never Used  Substance and Sexual Activity  . Alcohol use: Never    Frequency: Never  . Drug use: Never  . Sexual activity: Never  Lifestyle  . Physical activity:    Days per week: Not on file    Minutes per session: Not on file  . Stress: Not on file  Relationships  . Social connections:    Talks on phone: Not on file    Gets together: Not on file    Attends religious service: Not on file    Active member of club or organization: Not on file  Attends meetings of clubs or organizations: Not on file    Relationship status: Not on file  Other Topics Concern  . Not on file  Social History Narrative   Right handed.    Allergies  Allergen Reactions  . Ciprofloxacin Itching   History reviewed. No pertinent family history.  Current Outpatient Medications (Endocrine & Metabolic):  .  metFORMIN (GLUCOPHAGE) 1000 MG tablet, Take 1,000 mg by mouth daily with breakfast.   Current Outpatient Medications (Cardiovascular):  .  rosuvastatin (CRESTOR) 40 MG tablet, Take 40 mg by mouth daily.  Current Outpatient Medications (Respiratory):  .  cetirizine (ZYRTEC) 10 MG tablet,  .   montelukast (SINGULAIR) 10 MG tablet, Take 10 mg by mouth at bedtime.  Current Outpatient Medications (Analgesics):  .  acetaminophen (TYLENOL) 325 MG tablet, Take 650 mg by mouth every 6 (six) hours as needed for mild pain. Dorise Hiss (AIMOVIG) 70 MG/ML SOAJ, Inject 70 mg into the skin every 30 (thirty) days. Dorise Hiss (AIMOVIG) 70 MG/ML SOAJ, Inject 70 mg into the skin every 30 (thirty) days. .  SUMAtriptan (TOSYMRA) 10 MG/ACT SOLN, Place 1 spray into the nose every hour as needed (Maximum 3 sprays in 24 hours). .  SUMAtriptan (TOSYMRA) 10 MG/ACT SOLN, Place 1 spray into the nose as directed. .  SUMAtriptan (TOSYMRA) 10 MG/ACT SOLN, Place 1 spray into the nose as needed. Place 1 spray into the nose every hour as needed (Maximum 3 sprays in 24 hours).   Current Outpatient Medications (Other):  .  clonazePAM (KLONOPIN) 1 MG tablet, TAKE 1 TABLET BY MOUTH AS NEEDED ANXIETY .  gabapentin (NEURONTIN) 600 MG tablet, 3 (three) times daily.  .  hydrOXYzine (ATARAX/VISTARIL) 25 MG tablet, Take 1 tablet (25 mg total) by mouth every 6 (six) hours. .  ondansetron (ZOFRAN ODT) 4 MG disintegrating tablet, Take 1 tablet (4 mg total) by mouth every 8 (eight) hours as needed for nausea or vomiting. .  ondansetron (ZOFRAN ODT) 4 MG disintegrating tablet, Take 1 tablet (4 mg total) by mouth every 8 (eight) hours as needed for nausea or vomiting. .  pantoprazole (PROTONIX) 40 MG tablet, Take 40 mg by mouth every evening. Marland Kitchen  QUEtiapine (SEROQUEL XR) 400 MG 24 hr tablet, every evening. Marland Kitchen  REXULTI 0.5 MG TABS, Take 1 tablet by mouth daily. Marland Kitchen  tiZANidine (ZANAFLEX) 2 MG tablet, Take 1 tablet in AM and afternoon PRN and 2 tablets at bedtime. Marland Kitchen  tiZANidine (ZANAFLEX) 2 MG tablet, Take 1 tablet (2 mg total) by mouth as directed. Take 1 tablet in AM and afternoon PRN and 2 tablets at bedtime. .  traZODone (DESYREL) 100 MG tablet, TAKE 1 TABLET BY MOUTH EVERYDAY AT BEDTIME .  vortioxetine HBr (TRINTELLIX)  10 MG TABS tablet, Take 10 mg by mouth daily. .  cyclobenzaprine (FLEXERIL) 10 MG tablet, Take 1 tablet (10 mg total) by mouth 3 (three) times daily as needed for muscle spasms. .  Vitamin D, Ergocalciferol, (DRISDOL) 1.25 MG (50000 UT) CAPS capsule, Take 1 capsule (50,000 Units total) by mouth every 7 (seven) days.    Past medical history, social, surgical and family history all reviewed in electronic medical record.  No pertanent information unless stated regarding to the chief complaint.   Review of Systems:  No , visual changes, nausea, vomiting, diarrhea, constipation, dizziness, abdominal pain, skin rash, fevers, chills, night sweats, weight loss, swollen lymph nodes, body aches, joint swelling, chest pain, shortness of breath, mood changes.  Headaches and muscle  aches  Objective  Blood pressure 126/90, pulse 82, height 5\' 3"  (1.6 m), weight 205 lb (93 kg), SpO2 98 %.    General: No apparent distress alert and oriented x3 mood and affect normal, dressed appropriately.  HEENT: Pupils equal, extraocular movements intact  Respiratory: Patient's speak in full sentences and does not appear short of breath  Cardiovascular: No lower extremity edema, non tender, no erythema  Skin: Warm dry intact with no signs of infection or rash on extremities or on axial skeleton.  Abdomen: Soft nontender  Neuro: Cranial nerves II through XII are intact, neurovascularly intact in all extremities with 2+ DTRs and 2+ pulses.  Lymph: No lymphadenopathy of posterior or anterior cervical chain or axillae bilaterally.  Gait normal with good balance and coordination.  MSK:  Non tender with full range of motion and good stability and symmetric strength and tone of shoulders, elbows, wrist, hip, knee and ankles bilaterally.  Neck: Inspection mild loss of lordosis. No palpable stepoffs. Negative Spurling's maneuver. Full neck range of motion Grip strength and sensation normal in bilateral hands Strength good C4  to T1 distribution No sensory change to C4 to T1 Negative Hoffman sign bilaterally Reflexes normal Tightness of the trapezius bilaterally  Back Exam:  Inspection: Loss of lordosis with poor core strength Motion: Flexion 35 deg, Extension 20 deg, Side Bending to 25 deg bilaterally,  Rotation to 45 deg bilaterally  SLR laying: Negative  XSLR laying: Negative  Palpable tenderness: Tender to palpation of paraspinal musculature lumbar spine. FABER: Tightness on the left. Sensory change: Gross sensation intact to all lumbar and sacral dermatomes.  Reflexes: 2+ at both patellar tendons, 2+ at achilles tendons, Babinski's downgoing.  Strength at foot  Plantar-flexion: 5/5 Dorsi-flexion: 5/5 Eversion: 5/5 Inversion: 5/5  Mild weakness with 4 out of 5 strength with dorsiflexion on the left foot compared to the right   [id number]; 15 additional minutes spent for Therapeutic exercises as stated in above notes.  This included exercises focusing on stretching, strengthening, with significant focus on eccentric aspects.   Long term goals include an improvement in range of motion, strength, endurance as well as avoiding reinjury. Patient's frequency would include in 1-2 times a day, 3-5 times a week for a duration of 6-12 weeks. Low back exercises that included:  Pelvic tilt/bracing instruction to focus on control of the pelvic girdle and lower abdominal muscles  Glute strengthening exercises, focusing on proper firing of the glutes without engaging the low back muscles Proper stretching techniques for maximum relief for the hamstrings, hip flexors, low back and some rotation where tolerated    Proper technique shown and discussed handout in great detail with ATC.  All questions were discussed and answered.     Impression and Recommendations:     This case required medical decision making of moderate complexity. The above documentation has been reviewed and is accurate and complete Judi Saa, DO        Note: This dictation was prepared with Dragon dictation along with smaller phrase technology. Any transcriptional errors that result from this process are unintentional.

## 2018-05-12 ENCOUNTER — Other Ambulatory Visit (INDEPENDENT_AMBULATORY_CARE_PROVIDER_SITE_OTHER): Payer: BLUE CROSS/BLUE SHIELD

## 2018-05-12 ENCOUNTER — Ambulatory Visit (INDEPENDENT_AMBULATORY_CARE_PROVIDER_SITE_OTHER): Payer: BLUE CROSS/BLUE SHIELD | Admitting: Family Medicine

## 2018-05-12 ENCOUNTER — Telehealth: Payer: Self-pay | Admitting: *Deleted

## 2018-05-12 ENCOUNTER — Other Ambulatory Visit: Payer: Self-pay | Admitting: Family Medicine

## 2018-05-12 ENCOUNTER — Other Ambulatory Visit: Payer: Self-pay

## 2018-05-12 ENCOUNTER — Encounter: Payer: Self-pay | Admitting: Family Medicine

## 2018-05-12 VITALS — BP 126/90 | HR 82 | Ht 63.0 in | Wt 205.0 lb

## 2018-05-12 DIAGNOSIS — G4486 Cervicogenic headache: Secondary | ICD-10-CM | POA: Insufficient documentation

## 2018-05-12 DIAGNOSIS — M545 Low back pain, unspecified: Secondary | ICD-10-CM | POA: Insufficient documentation

## 2018-05-12 DIAGNOSIS — M2550 Pain in unspecified joint: Secondary | ICD-10-CM

## 2018-05-12 DIAGNOSIS — Z9884 Bariatric surgery status: Secondary | ICD-10-CM | POA: Diagnosis not present

## 2018-05-12 DIAGNOSIS — G8929 Other chronic pain: Secondary | ICD-10-CM | POA: Diagnosis not present

## 2018-05-12 DIAGNOSIS — D509 Iron deficiency anemia, unspecified: Secondary | ICD-10-CM | POA: Diagnosis not present

## 2018-05-12 DIAGNOSIS — F4310 Post-traumatic stress disorder, unspecified: Secondary | ICD-10-CM | POA: Diagnosis not present

## 2018-05-12 DIAGNOSIS — Z6837 Body mass index (BMI) 37.0-37.9, adult: Secondary | ICD-10-CM | POA: Diagnosis not present

## 2018-05-12 DIAGNOSIS — E119 Type 2 diabetes mellitus without complications: Secondary | ICD-10-CM | POA: Diagnosis not present

## 2018-05-12 DIAGNOSIS — M5442 Lumbago with sciatica, left side: Secondary | ICD-10-CM | POA: Diagnosis not present

## 2018-05-12 DIAGNOSIS — R51 Headache: Secondary | ICD-10-CM | POA: Diagnosis not present

## 2018-05-12 LAB — URIC ACID: Uric Acid, Serum: 2.8 mg/dL (ref 2.4–7.0)

## 2018-05-12 LAB — SEDIMENTATION RATE: Sed Rate: 51 mm/hr — ABNORMAL HIGH (ref 0–20)

## 2018-05-12 LAB — IBC PANEL
IRON: 23 ug/dL — AB (ref 42–145)
Saturation Ratios: 4.9 % — ABNORMAL LOW (ref 20.0–50.0)
Transferrin: 338 mg/dL (ref 212.0–360.0)

## 2018-05-12 LAB — FERRITIN: Ferritin: 5.1 ng/mL — ABNORMAL LOW (ref 10.0–291.0)

## 2018-05-12 LAB — TSH: TSH: 2.15 u[IU]/mL (ref 0.35–4.50)

## 2018-05-12 MED ORDER — CYCLOBENZAPRINE HCL 10 MG PO TABS: 10.0000 mg | ORAL_TABLET | Freq: Three times a day (TID) | ORAL | 0 refills | Status: DC | PRN

## 2018-05-12 MED ORDER — VITAMIN D (ERGOCALCIFEROL) 1.25 MG (50000 UNIT) PO CAPS: 50000.0000 [IU] | ORAL_CAPSULE | ORAL | 0 refills | Status: DC

## 2018-05-12 NOTE — Telephone Encounter (Signed)
Copied from CRM [id number]. Topic: General - Other >> May 12, 2018 12:51 PM Darletta Moll L wrote: Reason for CRM: Patient wants to know if tumeric and black pepper supplement is ok per Dr. Katrinka Blazing? Also needs a virtual visit f/u with him in 1 month.

## 2018-05-12 NOTE — Patient Instructions (Addendum)
Good to see you  Ice 20 minutes 2 times daily. Usually after activity and before bed. Exercises 3 times a week.  Labs downstairs  Natures made, NOW, puritan Pride Turmeric 500mg  daily  Tart cherry extract any dose at night Yoga only 2 times a week.  Prescription today  Once weekly vitamin D  Flexeril instead of the zanaflex Webex in 4 weeks Maybe see me again in 2 months  Please be safe

## 2018-05-12 NOTE — Assessment & Plan Note (Signed)
History of migraines, seeing neurology, I do believe some of the headaches and stress seems to be also contributing causing more of a cervicogenic headache.  We discussed posture and ergonomics, work with Event organiser, and continue the gabapentin, patient and and vitamin D secondary to gastric bypass that should help with muscle strength and endurance.  Discussed icing regimen.  Posture and ergonomics throughout the day.  Follow-up again on a web interview in 4 weeks as well as seen in the office in 8 weeks

## 2018-05-12 NOTE — Assessment & Plan Note (Signed)
Polyarthralgia discussed laboratory work-up.

## 2018-05-12 NOTE — Assessment & Plan Note (Signed)
Patient does have what appears to be an L4 nerve root impingement.  Previous MRI in 2018 did show this with some facet arthropathy.  Discussed with patient that likely vitamin D being low could be contributing.  This is likely secondary to absorption problems after gastric bypass.  Discussed posture ergonomics, weight loss, Flexeril given for breakthrough pain.  Follow-up again WebEx in 4 weeks and on exam in 8 weeks.

## 2018-05-13 LAB — PTH, INTACT AND CALCIUM
Calcium: 9.3 mg/dL (ref 8.6–10.2)
PTH: 35 pg/mL (ref 14–64)

## 2018-05-13 NOTE — Telephone Encounter (Signed)
Yes on the turmeric, yes set her up for a virtual in 3ish weeks

## 2018-05-13 NOTE — Telephone Encounter (Signed)
Discussed with pt. Scheduled for webex on 4.27 @ 215p.

## 2018-05-15 LAB — RHEUMATOID FACTOR: Rheumatoid fact SerPl-aCnc: <14 IU/mL (ref <14)

## 2018-05-15 LAB — PTH, INTACT AND CALCIUM
CALCIUM: 9.5 mg/dL (ref 8.6–10.2)
PTH: 18 pg/mL (ref 14–64)

## 2018-05-15 LAB — VITAMIN D 1,25 DIHYDROXY
VITAMIN D3 1, 25 (OH): 33 pg/mL
Vitamin D 1, 25 (OH)2 Total: 33 pg/mL (ref 18–72)
Vitamin D2 1, 25 (OH)2: <8 pg/mL

## 2018-05-15 LAB — CYCLIC CITRUL PEPTIDE ANTIBODY, IGG: Cyclic Citrullin Peptide Ab: <16 UNITS

## 2018-05-15 LAB — ANA: ANA: NEGATIVE

## 2018-05-15 LAB — CALCIUM, IONIZED: Calcium, Ion: 5.2 mg/dL (ref 4.8–5.6)

## 2018-05-20 ENCOUNTER — Telehealth: Payer: Self-pay

## 2018-05-20 NOTE — Telephone Encounter (Signed)
Called patient twice to discuss result notes. Left a message twice to call back for results.

## 2018-05-22 NOTE — Telephone Encounter (Signed)
Discussed with pt

## 2018-05-22 NOTE — Telephone Encounter (Signed)
Pt called back after missing two calls and is trying to touch base for labs. Labs are not release for [facility] and the office says noone is in in Dr. Michaelle Copas office. Please call pt again with results. [phone]

## 2018-05-26 DIAGNOSIS — F4310 Post-traumatic stress disorder, unspecified: Secondary | ICD-10-CM | POA: Diagnosis not present

## 2018-05-29 ENCOUNTER — Encounter: Payer: Self-pay | Admitting: *Deleted

## 2018-05-29 NOTE — Progress Notes (Signed)
AJHHID:[id number];BOERQS:[id number];Review Type:Prior Auth;Coverage Start Date:04/29/2018;Coverage End Date:05/29/2019; 05/29/2018  This information was faxed to [facility] [phone] on the form we received from [facility] on 05/22/2018

## 2018-05-29 NOTE — Progress Notes (Signed)
Received fax from [facility] on [facility], store# [phone] requesting [place] to initiate a prior authorization. Inititiated via: Covermymeds key: [id number]  05/29/2018.

## 2018-06-06 ENCOUNTER — Other Ambulatory Visit: Payer: Self-pay | Admitting: Family Medicine

## 2018-06-08 NOTE — Progress Notes (Signed)
[facility] [address] [place], [place] [address] Phone: [phone] Subjective:   Virtual Visit via Video Note  I connected with Heidi Holland on 06/08/18 at  2:15 PM EDT by a video enabled telemedicine application and verified that I am speaking with the correct person using two identifiers.   I discussed the limitations of evaluation and management by telemedicine and the availability of in person appointments. The patient expressed understanding and agreed to proceed.  Patient was in her home setting and I was in the office setting for the virtual platform.  We were the only ones there.    I discussed the assessment and treatment plan with the patient. The patient was provided an opportunity to ask questions and all were answered. The patient agreed with the plan and demonstrated an understanding of the instructions.   The patient was advised to call back or seek an in-person evaluation if the symptoms worsen or if the condition fails to improve as anticipated.  I provided 15 minutes of non-face-to-face time during this encounter.   Judi Saa, DO    CC: Headache and neck pain follow-up  [id number]  Heidi Holland is a 43 y.o. female coming in with complaint of neck pain and headache follow-up Patient has had this chronically for some time.  Was seen neurology.  Sent to me.  We held on any type of manipulation secondary to coronavirus outbreak.  Patient states if anything she seems to be doing a little better.  Is having improvement in range of motion with doing the exercises.  Has only been doing the iron minimally.  Has not gotten any of the other vitamin supplementation secondary to financial constraints.  States that the pain can still be intolerable at night makes it difficult to fall asleep.    Laboratory work-up showed severe decrease in iron.  Patient did have elevation in sedimentation rate.  Past Medical History:  Diagnosis Date   . Diabetes mellitus without complication [facility])    Past Surgical History:  Procedure Laterality Date  . BOWEL RESECTION    . GASTRIC BYPASS     Social History   Socioeconomic History  . Marital status: Single    Spouse name: Not on file  . Number of children: 0  . Years of education: 5  . Highest education level: Associate degree: occupational, Scientist, product/process development, or vocational program  Occupational History    Employer: [employer]  Social Needs  . Financial resource strain: Not on file  . Food insecurity:    Worry: Not on file    Inability: Not on file  . Transportation needs:    Medical: Not on file    Non-medical: Not on file  Tobacco Use  . Smoking status: Never Smoker  . Smokeless tobacco: Never Used  Substance and Sexual Activity  . Alcohol use: Never    Frequency: Never  . Drug use: Never  . Sexual activity: Never  Lifestyle  . Physical activity:    Days per week: Not on file    Minutes per session: Not on file  . Stress: Not on file  Relationships  . Social connections:    Talks on phone: Not on file    Gets together: Not on file    Attends religious service: Not on file    Active member of club or organization: Not on file    Attends meetings of clubs or organizations: Not on file    Relationship status: Not on  file  Other Topics Concern  . Not on file  Social History Narrative   Right handed.    Allergies  Allergen Reactions  . Ciprofloxacin Itching   No family history on file.  Current Outpatient Medications (Endocrine & Metabolic):  .  metFORMIN (GLUCOPHAGE) 1000 MG tablet, Take 1,000 mg by mouth daily with breakfast.   Current Outpatient Medications (Cardiovascular):  .  rosuvastatin (CRESTOR) 40 MG tablet, Take 40 mg by mouth daily.  Current Outpatient Medications (Respiratory):  .  cetirizine (ZYRTEC) 10 MG tablet,  .  montelukast (SINGULAIR) 10 MG tablet, Take 10 mg by mouth at bedtime.  Current Outpatient Medications (Analgesics):  .   acetaminophen (TYLENOL) 325 MG tablet, Take 650 mg by mouth every 6 (six) hours as needed for mild pain. Dorise Hiss.  Erenumab-aooe (AIMOVIG) 70 MG/ML SOAJ, Inject 70 mg into the skin every 30 (thirty) days. Dorise Hiss.  Erenumab-aooe (AIMOVIG) 70 MG/ML SOAJ, Inject 70 mg into the skin every 30 (thirty) days. .  SUMAtriptan (TOSYMRA) 10 MG/ACT SOLN, Place 1 spray into the nose every hour as needed (Maximum 3 sprays in 24 hours). .  SUMAtriptan (TOSYMRA) 10 MG/ACT SOLN, Place 1 spray into the nose as directed. .  SUMAtriptan (TOSYMRA) 10 MG/ACT SOLN, Place 1 spray into the nose as needed. Place 1 spray into the nose every hour as needed (Maximum 3 sprays in 24 hours).   Current Outpatient Medications (Other):  .  clonazePAM (KLONOPIN) 1 MG tablet, TAKE 1 TABLET BY MOUTH AS NEEDED ANXIETY .  cyclobenzaprine (FLEXERIL) 10 MG tablet, Take 1 tablet (10 mg total) by mouth 3 (three) times daily as needed for muscle spasms. Marland Kitchen.  gabapentin (NEURONTIN) 600 MG tablet, 3 (three) times daily.  .  hydrOXYzine (ATARAX/VISTARIL) 25 MG tablet, Take 1 tablet (25 mg total) by mouth every 6 (six) hours. .  ondansetron (ZOFRAN ODT) 4 MG disintegrating tablet, Take 1 tablet (4 mg total) by mouth every 8 (eight) hours as needed for nausea or vomiting. .  ondansetron (ZOFRAN ODT) 4 MG disintegrating tablet, Take 1 tablet (4 mg total) by mouth every 8 (eight) hours as needed for nausea or vomiting. .  pantoprazole (PROTONIX) 40 MG tablet, Take 40 mg by mouth every evening. Marland Kitchen.  QUEtiapine (SEROQUEL XR) 400 MG 24 hr tablet, every evening. Marland Kitchen.  REXULTI 0.5 MG TABS, Take 1 tablet by mouth daily. Marland Kitchen.  tiZANidine (ZANAFLEX) 2 MG tablet, Take 1 tablet in AM and afternoon PRN and 2 tablets at bedtime. Marland Kitchen.  tiZANidine (ZANAFLEX) 2 MG tablet, Take 1 tablet (2 mg total) by mouth as directed. Take 1 tablet in AM and afternoon PRN and 2 tablets at bedtime. .  traZODone (DESYREL) 100 MG tablet, TAKE 1 TABLET BY MOUTH EVERYDAY AT BEDTIME .  Vitamin D,  Ergocalciferol, (DRISDOL) 1.25 MG (50000 UT) CAPS capsule, Take 1 capsule (50,000 Units total) by mouth every 7 (seven) days. Marland Kitchen.  vortioxetine HBr (TRINTELLIX) 10 MG TABS tablet, Take 10 mg by mouth daily.    Past medical history, social, surgical and family history all reviewed in electronic medical record.  No pertanent information unless stated regarding to the chief complaint.   Review of Systems:  No  visual changes, nausea, vomiting, diarrhea, constipation, dizziness, abdominal pain, skin rash, fevers, chills, night sweats, weight loss, swollen lymph nodes, body aches, joint swelling,  chest pain, shortness of breath, mood changes.  Positive headaches, muscle aches  Objective     General: No apparent distress alert and oriented x3 mood  and affect normal, dressed appropriately.  HEENT: Pupils equal, extraocular movements intact  Respiratory: Patient's speak in full sentences and does not appear short of breath      Impression and Recommendations:    . The above documentation has been reviewed and is accurate and complete Judi Saa, DO       Note: This dictation was prepared with Dragon dictation along with smaller phrase technology. Any transcriptional errors that result from this process are unintentional.

## 2018-06-09 ENCOUNTER — Ambulatory Visit (INDEPENDENT_AMBULATORY_CARE_PROVIDER_SITE_OTHER): Payer: BLUE CROSS/BLUE SHIELD | Admitting: Family Medicine

## 2018-06-09 ENCOUNTER — Encounter: Payer: Self-pay | Admitting: Family Medicine

## 2018-06-09 DIAGNOSIS — R51 Headache: Secondary | ICD-10-CM

## 2018-06-09 DIAGNOSIS — D509 Iron deficiency anemia, unspecified: Secondary | ICD-10-CM | POA: Diagnosis not present

## 2018-06-09 DIAGNOSIS — G4486 Cervicogenic headache: Secondary | ICD-10-CM

## 2018-06-09 NOTE — Assessment & Plan Note (Signed)
Iron deficiency.  Discussed supplementation.

## 2018-06-09 NOTE — Assessment & Plan Note (Signed)
Patient will likely do better with the iron.  We discussed with her in great length and take it on a regular basis we discussed in the acidic environment could be beneficial as well.  Discussed continuing home exercises.  Follow-up again in 10 days and will consider starting manipulation.

## 2018-06-12 ENCOUNTER — Other Ambulatory Visit: Payer: Self-pay | Admitting: Nurse Practitioner

## 2018-06-12 ENCOUNTER — Other Ambulatory Visit (HOSPITAL_COMMUNITY)
Admission: RE | Admit: 2018-06-12 | Discharge: 2018-06-12 | Disposition: A | Discharge status: Home / Self Care | Payer: BLUE CROSS/BLUE SHIELD | Source: Ambulatory Visit | Attending: Nurse Practitioner | Admitting: Nurse Practitioner

## 2018-06-12 DIAGNOSIS — R790 Abnormal level of blood mineral: Secondary | ICD-10-CM | POA: Diagnosis not present

## 2018-06-12 DIAGNOSIS — Z8742 Personal history of other diseases of the female genital tract: Secondary | ICD-10-CM | POA: Diagnosis not present

## 2018-06-12 DIAGNOSIS — Z124 Encounter for screening for malignant neoplasm of cervix: Secondary | ICD-10-CM | POA: Diagnosis not present

## 2018-06-12 DIAGNOSIS — Z01419 Encounter for gynecological examination (general) (routine) without abnormal findings: Secondary | ICD-10-CM | POA: Diagnosis not present

## 2018-06-18 ENCOUNTER — Ambulatory Visit: Payer: BLUE CROSS/BLUE SHIELD | Admitting: Neurology

## 2018-06-18 LAB — CYTOLOGY - PAP
Chlamydia: NEGATIVE
HPV: DETECTED — AB
Neisseria Gonorrhea: NEGATIVE

## 2018-06-19 ENCOUNTER — Encounter: Payer: Self-pay | Admitting: Family Medicine

## 2018-06-19 ENCOUNTER — Other Ambulatory Visit: Payer: Self-pay

## 2018-06-19 ENCOUNTER — Ambulatory Visit (INDEPENDENT_AMBULATORY_CARE_PROVIDER_SITE_OTHER): Payer: BLUE CROSS/BLUE SHIELD | Admitting: Family Medicine

## 2018-06-19 VITALS — BP 144/82 | HR 93 | Ht 63.0 in | Wt 213.0 lb

## 2018-06-19 DIAGNOSIS — R51 Headache: Secondary | ICD-10-CM | POA: Diagnosis not present

## 2018-06-19 DIAGNOSIS — G4486 Cervicogenic headache: Secondary | ICD-10-CM

## 2018-06-19 DIAGNOSIS — M999 Biomechanical lesion, unspecified: Secondary | ICD-10-CM | POA: Diagnosis not present

## 2018-06-19 NOTE — Patient Instructions (Signed)
Good to see you  Ice is your friend Stay active Pelvic bone got off  See me again in 4ish weeks

## 2018-06-19 NOTE — Assessment & Plan Note (Signed)
Cervicogenic headaches.  Likely also secondary to iron deficiency.  Patient continues to take the vitamins directed we also discussed osteopathic manipulation.  Patient had this done today.  Tolerated the procedure pretty well.  No change in other medications.  Follow-up again in 4 weeks

## 2018-06-19 NOTE — Progress Notes (Signed)
[facility] [address] [place], [place] [address] Phone: [phone] Subjective:   I Heidi Holland am serving as a Neurosurgeon for Dr. Antoine Primas.  I'm seeing this patient by the request  of:  Heidi Holland   CC: Neck pain follow-up.   [id number]  Heidi Holland is a 43 y.o. female coming in with complaint of neck pain. Was doing a little better but is in pain today. Was given exercises.  Patient was diagnosed with more of a cervicogenic headaches.  Has been seeing neurology.  Also found to have severe anemia.  Started iron and has noticed that the neck seems to be doing little better.  Does have some low back pain as well.  Has had this for quite some time.  Describes it as a dull, throbbing aching sensation that stays localized.     Past Medical History:  Diagnosis Date  . Diabetes mellitus without complication [facility])    Past Surgical History:  Procedure Laterality Date  . BOWEL RESECTION    . GASTRIC BYPASS     Social History   Socioeconomic History  . Marital status: Single    Spouse name: Not on file  . Number of children: 0  . Years of education: 57  . Highest education level: Associate degree: occupational, Scientist, product/process development, or vocational program  Occupational History    Employer: [employer]  Social Needs  . Financial resource strain: Not on file  . Food insecurity:    Worry: Not on file    Inability: Not on file  . Transportation needs:    Medical: Not on file    Non-medical: Not on file  Tobacco Use  . Smoking status: Never Smoker  . Smokeless tobacco: Never Used  Substance and Sexual Activity  . Alcohol use: Never    Frequency: Never  . Drug use: Never  . Sexual activity: Never  Lifestyle  . Physical activity:    Days per week: Not on file    Minutes per session: Not on file  . Stress: Not on file  Relationships  . Social connections:    Talks on phone: Not on file    Gets together: Not on file    Attends religious service: Not on  file    Active member of club or organization: Not on file    Attends meetings of clubs or organizations: Not on file    Relationship status: Not on file  Other Topics Concern  . Not on file  Social History Narrative   Right handed.    Allergies  Allergen Reactions  . Ciprofloxacin Itching   No family history on file.  Current Outpatient Medications (Endocrine & Metabolic):  .  metFORMIN (GLUCOPHAGE) 1000 MG tablet, Take 1,000 mg by mouth daily with breakfast.   Current Outpatient Medications (Cardiovascular):  .  rosuvastatin (CRESTOR) 40 MG tablet, Take 40 mg by mouth daily.  Current Outpatient Medications (Respiratory):  .  cetirizine (ZYRTEC) 10 MG tablet,  .  montelukast (SINGULAIR) 10 MG tablet, Take 10 mg by mouth at bedtime.  Current Outpatient Medications (Analgesics):  .  acetaminophen (TYLENOL) 325 MG tablet, Take 650 mg by mouth every 6 (six) hours as needed for mild pain. Dorise Hiss (AIMOVIG) 70 MG/ML SOAJ, Inject 70 mg into the skin every 30 (thirty) days. Dorise Hiss (AIMOVIG) 70 MG/ML SOAJ, Inject 70 mg into the skin every 30 (thirty) days. .  SUMAtriptan (TOSYMRA) 10 MG/ACT SOLN, Place 1 spray into the  nose every hour as needed (Maximum 3 sprays in 24 hours). .  SUMAtriptan (TOSYMRA) 10 MG/ACT SOLN, Place 1 spray into the nose as directed. .  SUMAtriptan (TOSYMRA) 10 MG/ACT SOLN, Place 1 spray into the nose as needed. Place 1 spray into the nose every hour as needed (Maximum 3 sprays in 24 hours).   Current Outpatient Medications (Other):  .  clonazePAM (KLONOPIN) 1 MG tablet, TAKE 1 TABLET BY MOUTH AS NEEDED ANXIETY .  cyclobenzaprine (FLEXERIL) 10 MG tablet, TAKE 1 TABLET BY MOUTH THREE TIMES A DAY AS NEEDED FOR MUSCLE SPASMS .  gabapentin (NEURONTIN) 600 MG tablet, 3 (three) times daily.  .  hydrOXYzine (ATARAX/VISTARIL) 25 MG tablet, Take 1 tablet (25 mg total) by mouth every 6 (six) hours. .  ondansetron (ZOFRAN ODT) 4 MG disintegrating tablet,  Take 1 tablet (4 mg total) by mouth every 8 (eight) hours as needed for nausea or vomiting. .  ondansetron (ZOFRAN ODT) 4 MG disintegrating tablet, Take 1 tablet (4 mg total) by mouth every 8 (eight) hours as needed for nausea or vomiting. .  pantoprazole (PROTONIX) 40 MG tablet, Take 40 mg by mouth every evening. Marland Kitchen  QUEtiapine (SEROQUEL XR) 400 MG 24 hr tablet, every evening. Marland Kitchen  REXULTI 0.5 MG TABS, Take 1 tablet by mouth daily. Marland Kitchen  tiZANidine (ZANAFLEX) 2 MG tablet, Take 1 tablet in AM and afternoon PRN and 2 tablets at bedtime. Marland Kitchen  tiZANidine (ZANAFLEX) 2 MG tablet, Take 1 tablet (2 mg total) by mouth as directed. Take 1 tablet in AM and afternoon PRN and 2 tablets at bedtime. .  traZODone (DESYREL) 100 MG tablet, TAKE 1 TABLET BY MOUTH EVERYDAY AT BEDTIME .  Vitamin D, Ergocalciferol, (DRISDOL) 1.25 MG (50000 UT) CAPS capsule, Take 1 capsule (50,000 Units total) by mouth every 7 (seven) days. Marland Kitchen  vortioxetine HBr (TRINTELLIX) 10 MG TABS tablet, Take 10 mg by mouth daily.    Past medical history, social, surgical and family history all reviewed in electronic medical record.  No pertanent information unless stated regarding to the chief complaint.   Review of Systems:  No  visual changes, nausea, vomiting, diarrhea, constipation, dizziness, abdominal pain, skin rash, fevers, chills, night sweats, weight loss, swollen lymph nodes, joint swelling,  chest pain, shortness of breath, mood changes.  Positive muscle aches, headaches, body aches  Objective  Blood pressure (!) 144/82, pulse 93, height  (1.6 m), weight 213 lb (96.6 kg), SpO2 98 %.   General: No apparent distress alert and oriented x3 mood and affect normal, dressed appropriately.  HEENT: Pupils equal, extraocular movements intact  Respiratory: Patient's speak in full sentences and does not appear short of breath  Cardiovascular: No lower extremity edema, non tender, no erythema  Skin: Warm dry intact with no signs of infection or  rash on extremities or on axial skeleton.  Abdomen: Soft nontender poor core strength Neuro: Cranial nerves II through XII are intact, neurovascularly intact in all extremities with 2+ DTRs and 2+ pulses.  Lymph: No lymphadenopathy of posterior or anterior cervical chain or axillae bilaterally.  Gait normal with good balance and coordination.  MSK:  Non tender with full range of motion and good stability and symmetric strength and tone of shoulders, elbows, wrist, hip, knee and ankles bilaterally.    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  Patient does have decreased range of motion of the shoulders bilaterally with nuclear tightness.  Patient has a negative Spurling's.  Does have tightness of the paraspinal musculature of the neck at the occiput all the way to the parascapular region bilaterally right greater than left.  Back exam shows poor core strength.  Patient does have some mild loss of lordosis.  Patient does have tightness with Pearlean BrownieFaber test.  Negative straight leg test.  Osteopathic findings C6 flexed rotated and side bent left T3 extended rotated and side bent right inhaled third rib T8 extended rotated and side bent left L4 flexed rotated and side bent left  Sacrum right on right    Impression and Recommendations:     This case required medical decision making of moderate complexity. The above documentation has been reviewed and is accurate and complete Judi SaaZachary M Garry Bochicchio, Holland       Note: This dictation was prepared with Dragon dictation along with smaller phrase technology. Any transcriptional errors that result from this process are unintentional.

## 2018-06-19 NOTE — Assessment & Plan Note (Signed)
Decision today to treat with OMT was based on Physical Exam  After verbal consent patient was treated with HVLA, ME, FPR techniques in cervical, thoracic, rib lumbar and sacral areas  Patient tolerated the procedure well with improvement in symptoms  Patient given exercises, stretches and lifestyle modifications  See medications in patient instructions if given  Patient will follow up in 4 weeks 

## 2018-07-01 ENCOUNTER — Other Ambulatory Visit: Payer: Self-pay | Admitting: Nurse Practitioner

## 2018-07-01 DIAGNOSIS — N870 Mild cervical dysplasia: Secondary | ICD-10-CM | POA: Diagnosis not present

## 2018-07-01 DIAGNOSIS — Z3202 Encounter for pregnancy test, result negative: Secondary | ICD-10-CM | POA: Diagnosis not present

## 2018-07-01 DIAGNOSIS — Z8742 Personal history of other diseases of the female genital tract: Secondary | ICD-10-CM | POA: Diagnosis not present

## 2018-07-01 DIAGNOSIS — R790 Abnormal level of blood mineral: Secondary | ICD-10-CM | POA: Diagnosis not present

## 2018-07-03 ENCOUNTER — Other Ambulatory Visit: Payer: Self-pay

## 2018-07-03 MED ORDER — PREDNISONE 50 MG PO TABS: ORAL_TABLET | ORAL | 0 refills | Status: DC

## 2018-07-05 ENCOUNTER — Other Ambulatory Visit: Payer: Self-pay | Admitting: Family Medicine

## 2018-07-08 NOTE — Progress Notes (Deleted)
[facility] [address] [place], [place] [address] Phone: [phone] Subjective:    I'm seeing this patient by the request  of:    CC:   [id number]  Heidi Holland is a 43 y.o. female coming in with complaint of ***  Onset-  Location Duration-  Character- Aggravating factors- Reliving factors-  Therapies tried-  Severity-     Past Medical History:  Diagnosis Date  . Diabetes mellitus without complication [facility])    Past Surgical History:  Procedure Laterality Date  . BOWEL RESECTION    . GASTRIC BYPASS     Social History   Socioeconomic History  . Marital status: Single    Spouse name: Not on file  . Number of children: 0  . Years of education: 58  . Highest education level: Associate degree: occupational, Scientist, product/process development, or vocational program  Occupational History    Employer: [employer]  Social Needs  . Financial resource strain: Not on file  . Food insecurity:    Worry: Not on file    Inability: Not on file  . Transportation needs:    Medical: Not on file    Non-medical: Not on file  Tobacco Use  . Smoking status: Never Smoker  . Smokeless tobacco: Never Used  Substance and Sexual Activity  . Alcohol use: Never    Frequency: Never  . Drug use: Never  . Sexual activity: Never  Lifestyle  . Physical activity:    Days per week: Not on file    Minutes per session: Not on file  . Stress: Not on file  Relationships  . Social connections:    Talks on phone: Not on file    Gets together: Not on file    Attends religious service: Not on file    Active member of club or organization: Not on file    Attends meetings of clubs or organizations: Not on file    Relationship status: Not on file  Other Topics Concern  . Not on file  Social History Narrative   Right handed.    Allergies  Allergen Reactions  . Ciprofloxacin Itching   No family history on file.  Current Outpatient Medications (Endocrine & Metabolic):  .   metFORMIN (GLUCOPHAGE) 1000 MG tablet, Take 1,000 mg by mouth daily with breakfast.  .  predniSONE (DELTASONE) 50 MG tablet, Once daily for 5 days.  Current Outpatient Medications (Cardiovascular):  .  rosuvastatin (CRESTOR) 40 MG tablet, Take 40 mg by mouth daily.  Current Outpatient Medications (Respiratory):  .  cetirizine (ZYRTEC) 10 MG tablet,  .  montelukast (SINGULAIR) 10 MG tablet, Take 10 mg by mouth at bedtime.  Current Outpatient Medications (Analgesics):  .  acetaminophen (TYLENOL) 325 MG tablet, Take 650 mg by mouth every 6 (six) hours as needed for mild pain. Dorise Hiss (AIMOVIG) 70 MG/ML SOAJ, Inject 70 mg into the skin every 30 (thirty) days. Dorise Hiss (AIMOVIG) 70 MG/ML SOAJ, Inject 70 mg into the skin every 30 (thirty) days. .  SUMAtriptan (TOSYMRA) 10 MG/ACT SOLN, Place 1 spray into the nose every hour as needed (Maximum 3 sprays in 24 hours). .  SUMAtriptan (TOSYMRA) 10 MG/ACT SOLN, Place 1 spray into the nose as directed. .  SUMAtriptan (TOSYMRA) 10 MG/ACT SOLN, Place 1 spray into the nose as needed. Place 1 spray into the nose every hour as needed (Maximum 3 sprays in 24 hours).   Current Outpatient Medications (Other):  .  clonazePAM (KLONOPIN) 1  MG tablet, TAKE 1 TABLET BY MOUTH AS NEEDED ANXIETY .  cyclobenzaprine (FLEXERIL) 10 MG tablet, TAKE 1 TABLET BY MOUTH THREE TIMES A DAY AS NEEDED FOR MUSCLE SPASMS .  gabapentin (NEURONTIN) 600 MG tablet, 3 (three) times daily.  .  hydrOXYzine (ATARAX/VISTARIL) 25 MG tablet, Take 1 tablet (25 mg total) by mouth every 6 (six) hours. .  ondansetron (ZOFRAN ODT) 4 MG disintegrating tablet, Take 1 tablet (4 mg total) by mouth every 8 (eight) hours as needed for nausea or vomiting. .  ondansetron (ZOFRAN ODT) 4 MG disintegrating tablet, Take 1 tablet (4 mg total) by mouth every 8 (eight) hours as needed for nausea or vomiting. .  pantoprazole (PROTONIX) 40 MG tablet, Take 40 mg by mouth every evening. Marland Kitchen.  QUEtiapine  (SEROQUEL XR) 400 MG 24 hr tablet, every evening. Marland Kitchen.  REXULTI 0.5 MG TABS, Take 1 tablet by mouth daily. Marland Kitchen.  tiZANidine (ZANAFLEX) 2 MG tablet, Take 1 tablet in AM and afternoon PRN and 2 tablets at bedtime. Marland Kitchen.  tiZANidine (ZANAFLEX) 2 MG tablet, Take 1 tablet (2 mg total) by mouth as directed. Take 1 tablet in AM and afternoon PRN and 2 tablets at bedtime. .  traZODone (DESYREL) 100 MG tablet, TAKE 1 TABLET BY MOUTH EVERYDAY AT BEDTIME .  Vitamin D, Ergocalciferol, (DRISDOL) 1.25 MG (50000 UT) CAPS capsule, Take 1 capsule (50,000 Units total) by mouth every 7 (seven) days. Marland Kitchen.  vortioxetine HBr (TRINTELLIX) 10 MG TABS tablet, Take 10 mg by mouth daily.    Past medical history, social, surgical and family history all reviewed in electronic medical record.  No pertanent information unless stated regarding to the chief complaint.   Review of Systems:  No headache, visual changes, nausea, vomiting, diarrhea, constipation, dizziness, abdominal pain, skin rash, fevers, chills, night sweats, weight loss, swollen lymph nodes, body aches, joint swelling, muscle aches, chest pain, shortness of breath, mood changes.   Objective  There were no vitals taken for this visit. Systems examined below as of    General: No apparent distress alert and oriented x3 mood and affect normal, dressed appropriately.  HEENT: Pupils equal, extraocular movements intact  Respiratory: Patient's speak in full sentences and does not appear short of breath  Cardiovascular: No lower extremity edema, non tender, no erythema  Skin: Warm dry intact with no signs of infection or rash on extremities or on axial skeleton.  Abdomen: Soft nontender  Neuro: Cranial nerves II through XII are intact, neurovascularly intact in all extremities with 2+ DTRs and 2+ pulses.  Lymph: No lymphadenopathy of posterior or anterior cervical chain or axillae bilaterally.  Gait normal with good balance and coordination.  MSK:  Non tender with full range  of motion and good stability and symmetric strength and tone of shoulders, elbows, wrist, hip, knee and ankles bilaterally.     Impression and Recommendations:     This case required medical decision making of moderate complexity. The above documentation has been reviewed and is accurate and complete Judi SaaZachary M Amos Gaber, DO       Note: This dictation was prepared with Dragon dictation along with smaller phrase technology. Any transcriptional errors that result from this process are unintentional.

## 2018-07-09 ENCOUNTER — Ambulatory Visit: Payer: BLUE CROSS/BLUE SHIELD | Admitting: Family Medicine

## 2018-07-16 NOTE — Progress Notes (Signed)
[facility] [address] [place], [place] [address] Phone: [phone] Subjective:   Bruce Donath, am serving as a scribe for Dr. Antoine Primas.   CC: Neck pain and headache follow-up  [id number]  Heidi Holland is a 43 y.o. female coming in with complaint of lower back and left leg pain. Pain is radiating into the left knee. Has been having muscle spasms which were alleviated with prednisone. Spasms are back now that she is off medication. States that she is having a hard time sleeping due to pain.      Past Medical History:  Diagnosis Date  . Diabetes mellitus without complication [facility])    Past Surgical History:  Procedure Laterality Date  . BOWEL RESECTION    . GASTRIC BYPASS     Social History   Socioeconomic History  . Marital status: Single    Spouse name: Not on file  . Number of children: 0  . Years of education: 74  . Highest education level: Associate degree: occupational, Scientist, product/process development, or vocational program  Occupational History    Employer: [employer]  Social Needs  . Financial resource strain: Not on file  . Food insecurity:    Worry: Not on file    Inability: Not on file  . Transportation needs:    Medical: Not on file    Non-medical: Not on file  Tobacco Use  . Smoking status: Never Smoker  . Smokeless tobacco: Never Used  Substance and Sexual Activity  . Alcohol use: Never    Frequency: Never  . Drug use: Never  . Sexual activity: Never  Lifestyle  . Physical activity:    Days per week: Not on file    Minutes per session: Not on file  . Stress: Not on file  Relationships  . Social connections:    Talks on phone: Not on file    Gets together: Not on file    Attends religious service: Not on file    Active member of club or organization: Not on file    Attends meetings of clubs or organizations: Not on file    Relationship status: Not on file  Other Topics Concern  . Not on file  Social History Narrative   Right  handed.    Allergies  Allergen Reactions  . Ciprofloxacin Itching   No family history on file.  Current Outpatient Medications (Endocrine & Metabolic):  .  metFORMIN (GLUCOPHAGE) 1000 MG tablet, Take 1,000 mg by mouth daily with breakfast.   Current Outpatient Medications (Cardiovascular):  .  rosuvastatin (CRESTOR) 40 MG tablet, Take 40 mg by mouth daily.  Current Outpatient Medications (Respiratory):  .  cetirizine (ZYRTEC) 10 MG tablet,  .  montelukast (SINGULAIR) 10 MG tablet, Take 10 mg by mouth at bedtime.  Current Outpatient Medications (Analgesics):  .  acetaminophen (TYLENOL) 325 MG tablet, Take 650 mg by mouth every 6 (six) hours as needed for mild pain. Dorise Hiss (AIMOVIG) 70 MG/ML SOAJ, Inject 70 mg into the skin every 30 (thirty) days. Dorise Hiss (AIMOVIG) 70 MG/ML SOAJ, Inject 70 mg into the skin every 30 (thirty) days. .  SUMAtriptan (TOSYMRA) 10 MG/ACT SOLN, Place 1 spray into the nose every hour as needed (Maximum 3 sprays in 24 hours). .  SUMAtriptan (TOSYMRA) 10 MG/ACT SOLN, Place 1 spray into the nose as directed. .  SUMAtriptan (TOSYMRA) 10 MG/ACT SOLN, Place 1 spray into the nose as needed. Place 1 spray into the nose every  hour as needed (Maximum 3 sprays in 24 hours).   Current Outpatient Medications (Other):  .  clonazePAM (KLONOPIN) 1 MG tablet, TAKE 1 TABLET BY MOUTH AS NEEDED ANXIETY .  cyclobenzaprine (FLEXERIL) 10 MG tablet, TAKE 1 TABLET BY MOUTH THREE TIMES A DAY AS NEEDED FOR MUSCLE SPASMS .  gabapentin (NEURONTIN) 600 MG tablet, 3 (three) times daily.  .  hydrOXYzine (ATARAX/VISTARIL) 25 MG tablet, Take 1 tablet (25 mg total) by mouth every 6 (six) hours. .  ondansetron (ZOFRAN ODT) 4 MG disintegrating tablet, Take 1 tablet (4 mg total) by mouth every 8 (eight) hours as needed for nausea or vomiting. .  ondansetron (ZOFRAN ODT) 4 MG disintegrating tablet, Take 1 tablet (4 mg total) by mouth every 8 (eight) hours as needed for nausea or  vomiting. .  pantoprazole (PROTONIX) 40 MG tablet, Take 40 mg by mouth every evening. Marland Kitchen  QUEtiapine (SEROQUEL XR) 400 MG 24 hr tablet, every evening. Marland Kitchen  REXULTI 0.5 MG TABS, Take 1 tablet by mouth daily. Marland Kitchen  tiZANidine (ZANAFLEX) 2 MG tablet, Take 1 tablet in AM and afternoon PRN and 2 tablets at bedtime. Marland Kitchen  tiZANidine (ZANAFLEX) 2 MG tablet, Take 1 tablet (2 mg total) by mouth as directed. Take 1 tablet in AM and afternoon PRN and 2 tablets at bedtime. .  traZODone (DESYREL) 100 MG tablet, TAKE 1 TABLET BY MOUTH EVERYDAY AT BEDTIME .  Vitamin D, Ergocalciferol, (DRISDOL) 1.25 MG (50000 UT) CAPS capsule, Take 1 capsule (50,000 Units total) by mouth every 7 (seven) days. Marland Kitchen  vortioxetine HBr (TRINTELLIX) 10 MG TABS tablet, Take 10 mg by mouth daily.    Past medical history, social, surgical and family history all reviewed in electronic medical record.  No pertanent information unless stated regarding to the chief complaint.   Review of Systems:  No  visual changes, nausea, vomiting, diarrhea, constipation, dizziness, abdominal pain, skin rash, fevers, chills, night sweats, weight loss, swollen lymph nodes, body aches, joint swelling,  chest pain, shortness of breath, mood changes.  Positive muscle aches and headache  Objective  Blood pressure 102/72, pulse 93, height 5\' 3"  (1.6 m), weight 210 lb (95.3 kg), SpO2 99 %.    General: No apparent distress alert and oriented x3 mood and affect normal, dressed appropriately.  HEENT: Pupils equal, extraocular movements intact  Respiratory: Patient's speak in full sentences and does not appear short of breath  Cardiovascular: No lower extremity edema, non tender, no erythema  Skin: Warm dry intact with no signs of infection or rash on extremities or on axial skeleton.  Abdomen: Soft nontender  Neuro: Cranial nerves II through XII are intact, neurovascularly intact in all extremities with 2+ DTRs and 2+ pulses.  Lymph: No lymphadenopathy of posterior  or anterior cervical chain or axillae bilaterally.  Gait normal with good balance and coordination.  MSK:  Non tender with full range of motion and good stability and symmetric strength and tone of shoulders, elbows, wrist, hip, knee and ankles bilaterally.  Neck: Inspection unremarkable. No palpable stepoffs. Negative Spurling's maneuver. Mild tightness noted Grip strength and sensation normal in bilateral hands Strength good C4 to T1 distribution No sensory change to C4 to T1 Negative Hoffman sign bilaterally Reflexes normal   Low back exam shows the patient does have tenderness to palpation more of the gluteal area and laterally.  Patient does have some weakness with hip extension compared to contralateral side.  Positive Faber test on the left.  Negative straight leg test.  Mild pain in the paraspinal musculature lumbar spine.  Mild limited range of motion in all planes of 5 to 10 degrees  Procedure: Real-time Ultrasound Guided Injection of left gluteal tendon sheath Device: GE Logiq Q7 Ultrasound guided injection is preferred based studies that show increased duration, increased effect, greater accuracy, decreased procedural pain, increased response rate, and decreased cost with ultrasound guided versus blind injection.  Verbal informed consent obtained.  Time-out conducted.  Noted no overlying erythema, induration, or other signs of local infection.  Skin prepped in a sterile fashion.  Local anesthesia: Topical Ethyl chloride.  With sterile technique and under real time ultrasound guidance: With a 21-gauge 2 inch needle injected with 0.5 cc of 0.5% Marcaine and 1 cc of Kenalog 40 mg/mL Completed without difficulty  Pain immediately resolved suggesting accurate placement of the medication.  Advised to call if fevers/chills, erythema, induration, drainage, or persistent bleeding.  Images permanently stored and available for review in the ultrasound unit.  Impression: Technically  successful ultrasound guided injection.    Impression and Recommendations:     This case required medical decision making of moderate complexity. The above documentation has been reviewed and is accurate and complete Judi SaaZachary M Marque Bango, DO       Note: This dictation was prepared with Dragon dictation along with smaller phrase technology. Any transcriptional errors that result from this process are unintentional.

## 2018-07-17 ENCOUNTER — Encounter: Payer: Self-pay | Admitting: Family Medicine

## 2018-07-17 ENCOUNTER — Ambulatory Visit (INDEPENDENT_AMBULATORY_CARE_PROVIDER_SITE_OTHER): Payer: BLUE CROSS/BLUE SHIELD | Admitting: Family Medicine

## 2018-07-17 ENCOUNTER — Other Ambulatory Visit: Payer: Self-pay

## 2018-07-17 ENCOUNTER — Ambulatory Visit: Payer: Self-pay

## 2018-07-17 VITALS — BP 102/72 | HR 93 | Ht 63.0 in | Wt 210.0 lb

## 2018-07-17 DIAGNOSIS — F332 Major depressive disorder, recurrent severe without psychotic features: Secondary | ICD-10-CM | POA: Diagnosis not present

## 2018-07-17 DIAGNOSIS — M25552 Pain in left hip: Secondary | ICD-10-CM | POA: Diagnosis not present

## 2018-07-17 DIAGNOSIS — F419 Anxiety disorder, unspecified: Secondary | ICD-10-CM | POA: Diagnosis not present

## 2018-07-17 DIAGNOSIS — E782 Mixed hyperlipidemia: Secondary | ICD-10-CM | POA: Diagnosis not present

## 2018-07-17 DIAGNOSIS — M7602 Gluteal tendinitis, left hip: Secondary | ICD-10-CM | POA: Diagnosis not present

## 2018-07-17 DIAGNOSIS — E1169 Type 2 diabetes mellitus with other specified complication: Secondary | ICD-10-CM | POA: Diagnosis not present

## 2018-07-17 MED ORDER — TRAZODONE HCL 100 MG PO TABS: ORAL_TABLET | ORAL | 1 refills | Status: DC

## 2018-07-17 NOTE — Assessment & Plan Note (Signed)
Patient given injection today.  Tolerated the procedure well.  Discussed icing regimen and home exercises.  Discussed which activities of doing which was to avoid.  Patient is to increase activity slowly.  Follow-up again in 4 to 6 weeks

## 2018-07-17 NOTE — Patient Instructions (Addendum)
Good to see you.  Ice 20 minutes 2 times daily. Usually after activity and before bed. Injected the left piriformis today  100mg  of trazadone at night if you want.  Tennis ball in bak left pocket with sitting Exercises 3 times a week.   See me again in 3 weeks and we will restart manipulation

## 2018-07-21 ENCOUNTER — Other Ambulatory Visit: Payer: Self-pay | Admitting: Family Medicine

## 2018-07-21 DIAGNOSIS — F4310 Post-traumatic stress disorder, unspecified: Secondary | ICD-10-CM | POA: Diagnosis not present

## 2018-07-22 ENCOUNTER — Other Ambulatory Visit: Payer: Self-pay

## 2018-07-29 ENCOUNTER — Ambulatory Visit: Payer: BLUE CROSS/BLUE SHIELD | Admitting: Family Medicine

## 2018-07-31 ENCOUNTER — Other Ambulatory Visit: Payer: Self-pay

## 2018-07-31 ENCOUNTER — Encounter: Payer: Self-pay | Admitting: Family Medicine

## 2018-07-31 ENCOUNTER — Ambulatory Visit (INDEPENDENT_AMBULATORY_CARE_PROVIDER_SITE_OTHER): Payer: BLUE CROSS/BLUE SHIELD | Admitting: Family Medicine

## 2018-07-31 VITALS — BP 136/78 | HR 91 | Temp 98.4°F | Ht 63.0 in | Wt 210.2 lb

## 2018-07-31 DIAGNOSIS — Z6837 Body mass index (BMI) 37.0-37.9, adult: Secondary | ICD-10-CM

## 2018-07-31 DIAGNOSIS — G43801 Other migraine, not intractable, with status migrainosus: Secondary | ICD-10-CM

## 2018-07-31 DIAGNOSIS — K429 Umbilical hernia without obstruction or gangrene: Secondary | ICD-10-CM | POA: Insufficient documentation

## 2018-07-31 DIAGNOSIS — K219 Gastro-esophageal reflux disease without esophagitis: Secondary | ICD-10-CM | POA: Insufficient documentation

## 2018-07-31 DIAGNOSIS — G43909 Migraine, unspecified, not intractable, without status migrainosus: Secondary | ICD-10-CM | POA: Insufficient documentation

## 2018-07-31 DIAGNOSIS — G4700 Insomnia, unspecified: Secondary | ICD-10-CM | POA: Insufficient documentation

## 2018-07-31 DIAGNOSIS — F325 Major depressive disorder, single episode, in full remission: Secondary | ICD-10-CM | POA: Insufficient documentation

## 2018-07-31 DIAGNOSIS — M2550 Pain in unspecified joint: Secondary | ICD-10-CM

## 2018-07-31 DIAGNOSIS — E1169 Type 2 diabetes mellitus with other specified complication: Secondary | ICD-10-CM | POA: Insufficient documentation

## 2018-07-31 DIAGNOSIS — E119 Type 2 diabetes mellitus without complications: Secondary | ICD-10-CM

## 2018-07-31 DIAGNOSIS — E785 Hyperlipidemia, unspecified: Secondary | ICD-10-CM

## 2018-07-31 DIAGNOSIS — D509 Iron deficiency anemia, unspecified: Secondary | ICD-10-CM

## 2018-07-31 DIAGNOSIS — J309 Allergic rhinitis, unspecified: Secondary | ICD-10-CM | POA: Insufficient documentation

## 2018-07-31 DIAGNOSIS — J4520 Mild intermittent asthma, uncomplicated: Secondary | ICD-10-CM

## 2018-07-31 DIAGNOSIS — F419 Anxiety disorder, unspecified: Secondary | ICD-10-CM | POA: Insufficient documentation

## 2018-07-31 DIAGNOSIS — H00014 Hordeolum externum left upper eyelid: Secondary | ICD-10-CM

## 2018-07-31 DIAGNOSIS — Z9884 Bariatric surgery status: Secondary | ICD-10-CM | POA: Insufficient documentation

## 2018-07-31 DIAGNOSIS — Z0001 Encounter for general adult medical examination with abnormal findings: Secondary | ICD-10-CM | POA: Diagnosis not present

## 2018-07-31 MED ORDER — ALBUTEROL SULFATE HFA 108 (90 BASE) MCG/ACT IN AERS: 2.0000 | INHALATION_SPRAY | Freq: Four times a day (QID) | RESPIRATORY_TRACT | 1 refills | Status: DC | PRN

## 2018-07-31 MED ORDER — BACLOFEN 10 MG PO TABS: 10.0000 mg | ORAL_TABLET | Freq: Three times a day (TID) | ORAL | 0 refills | Status: DC

## 2018-07-31 MED ORDER — BLOOD GLUCOSE MONITOR KIT: PACK | 0 refills | Status: DC

## 2018-07-31 MED ORDER — SUMATRIPTAN SUCCINATE 50 MG PO TABS: 50.0000 mg | ORAL_TABLET | ORAL | 0 refills | Status: DC | PRN

## 2018-07-31 NOTE — Assessment & Plan Note (Signed)
Continue Crestor 40 mg daily.  Obtain records from most recent PCP.

## 2018-07-31 NOTE — Assessment & Plan Note (Signed)
Stable Albuterol inhaler refilled 

## 2018-07-31 NOTE — Assessment & Plan Note (Signed)
She has had difficulty maintaining normal iron stores because of her gastric bypass.  We will set up to have iron infusions done.

## 2018-07-31 NOTE — Assessment & Plan Note (Signed)
Continue management per sports medicine. 

## 2018-07-31 NOTE — Progress Notes (Signed)
Chief Complaint:  Heidi Holland is a 43 y.o. female who presents today for her annual comprehensive physical exam and to establish care.     Assessment/Plan:  BMI 37 Discussed lifestyle modifications.   Preventative Healthcare: UTD on vaccines and screenings.  Gets mammogram and Pap smear done via gynecology.  Recently had lab work-will obtain these records.  Patient Counseling(The following topics were reviewed and/or handout was given):  -Nutrition: Stressed importance of moderation in sodium/caffeine intake, saturated fat and cholesterol, caloric balance, sufficient intake of fresh fruits, vegetables, and fiber.  -Stressed the importance of regular exercise.   -Substance Abuse: Discussed cessation/primary prevention of tobacco, alcohol, or other drug use; driving or other dangerous activities under the influence; availability of treatment for abuse.   -Injury prevention: Discussed safety belts, safety helmets, smoke detector, smoking near bedding or upholstery.   -Sexuality: Discussed sexually transmitted diseases, partner selection, use of condoms, avoidance of unintended pregnancy and contraceptive alternatives.   -Dental health: Discussed importance of regular tooth brushing, flossing, and dental visits.  -Health maintenance and immunizations reviewed. Please refer to Health maintenance section.  Return to care in 1 year for next preventative visit.     Subjective:  HPI:  She has no acute complaints today.   Please see attached note regarding her chronic issues.  She is establishing care here.  She recently moved to the area about a year ago after living in [place] for most of her life.  She is here with her sister.  She has been forloguhed due to COVID-19 however thinks she is be managing reasonably well.  Health Maintenance Due  Topic Date Due  . HEMOGLOBIN A1C  1975/03/15  . PNEUMOCOCCAL POLYSACCHARIDE VACCINE AGE 46-64 HIGH RISK  09/13/1977  . FOOT EXAM  09/13/1985  .  OPHTHALMOLOGY EXAM  09/13/1985  . HIV Screening  09/14/1990    ROS: Per HPI, otherwise a complete review of systems was negative.   PMH:  The following were reviewed and entered/updated in epic: Past Medical History:  Diagnosis Date  . Allergy   . Anxiety   . Asthma   . Depression   . Diabetes mellitus without complication ([place])   . Diverticulitis   . GERD (gastroesophageal reflux disease)   . HPV in female   . Hyperlipidemia   . Migraines    Patient Active Problem List   Diagnosis Date Noted  . T2DM (type 2 diabetes mellitus) ([place]) 07/31/2018  . Dyslipidemia associated with type 2 diabetes mellitus ([place]) 07/31/2018  . Allergic rhinitis 07/31/2018  . Mild intermittent asthma, uncomplicated 79/15/0569  . GERD (gastroesophageal reflux disease) 07/31/2018  . S/P gastric bypass 07/31/2018  . Migraine 07/31/2018  . Anxiety 07/31/2018  . Depression, major, single episode, complete remission ([place]) 07/31/2018  . Insomnia 07/31/2018  . Umbilical hernia 79/48/0165  . Gluteal tendinitis of left buttock 07/17/2018  . Nonallopathic lesion of cervical region 06/19/2018  . Nonallopathic lesion of thoracic region 06/19/2018  . Nonallopathic lesion of sacral region 06/19/2018  . Nonallopathic lesion of rib cage 06/19/2018  . Nonallopathic lesion of lumbosacral region 06/19/2018  . Iron deficiency anemia 06/09/2018  . Cervicogenic headache 05/12/2018  . Low back pain 05/12/2018  . Polyarthralgia 05/12/2018   Past Surgical History:  Procedure Laterality Date  . BOWEL RESECTION    . gasticbypass    . GASTRIC BYPASS      Family History  Problem Relation Age of Onset  . Heart disease Mother   . Lung cancer Mother   .  Rheum arthritis Mother   . Diabetes Father   . Heart disease Father   . Breast cancer Paternal Aunt     Medications- reviewed and updated Current Outpatient Medications  Medication Sig Dispense Refill  . acetaminophen (TYLENOL) 325 MG tablet Take 650 mg by  mouth every 6 (six) hours as needed for mild pain.    . cetirizine (ZYRTEC) 10 MG tablet     . clonazePAM (KLONOPIN) 0.5 MG tablet 0.5 mg as needed.     Eduard Roux (AIMOVIG) 70 MG/ML SOAJ Inject 70 mg into the skin every 30 (thirty) days. 1 pen 11  . Erenumab-aooe (AIMOVIG) 70 MG/ML SOAJ Inject 70 mg into the skin every 30 (thirty) days. 1 pen 11  . gabapentin (NEURONTIN) 600 MG tablet 3 (three) times daily.     Marland Kitchen IRON-VITAMIN C PO Take by mouth.    Marland Kitchen JARDIANCE 10 MG TABS tablet Take 10 mg by mouth daily.    . metFORMIN (GLUCOPHAGE) 500 MG tablet Take 2,000 mg by mouth daily with breakfast.     . montelukast (SINGULAIR) 10 MG tablet Take 10 mg by mouth at bedtime.    . ondansetron (ZOFRAN ODT) 4 MG disintegrating tablet Take 1 tablet (4 mg total) by mouth every 8 (eight) hours as needed for nausea or vomiting. 20 tablet 0  . pantoprazole (PROTONIX) 40 MG tablet Take 40 mg by mouth every evening.    Marland Kitchen REXULTI 0.5 MG TABS Take 1 tablet by mouth daily.    . rosuvastatin (CRESTOR) 40 MG tablet Take 40 mg by mouth daily.    . Vitamin D, Ergocalciferol, (DRISDOL) 1.25 MG (50000 UT) CAPS capsule TAKE 1 CAPSULE BY MOUTH EVERY 7 DAYS 12 capsule 0  . vortioxetine HBr (TRINTELLIX) 10 MG TABS tablet Take 10 mg by mouth daily.    Marland Kitchen albuterol (VENTOLIN HFA) 108 (90 Base) MCG/ACT inhaler Inhale 2 puffs into the lungs every 6 (six) hours as needed for wheezing or shortness of breath. 18 g 1  . baclofen (LIORESAL) 10 MG tablet Take 1 tablet (10 mg total) by mouth 3 (three) times daily. 30 each 0  . blood glucose meter kit and supplies KIT Dispense based on patient and insurance preference. Use up to four times daily as directed. (FOR ICD-9 250.00, 250.01). 1 each 0  . SUMAtriptan (IMITREX) 50 MG tablet Take 1 tablet (50 mg total) by mouth every 2 (two) hours as needed for migraine. May repeat in 2 hours if headache persists or recurs. 10 tablet 0   No current facility-administered medications for this  visit.     Allergies-reviewed and updated Allergies  Allergen Reactions  . Ciprofloxacin Itching    Social History   Socioeconomic History  . Marital status: Single    Spouse name: Not on file  . Number of children: 0  . Years of education: 26  . Highest education level: Associate degree: occupational, Hotel manager, or vocational program  Occupational History    Employer: [employer]  Social Needs  . Financial resource strain: Not on file  . Food insecurity    Worry: Not on file    Inability: Not on file  . Transportation needs    Medical: Not on file    Non-medical: Not on file  Tobacco Use  . Smoking status: Never Smoker  . Smokeless tobacco: Never Used  Substance and Sexual Activity  . Alcohol use: Yes    Frequency: Never    Comment: Occ  . Drug use: Yes  Comment: THC  . Sexual activity: Never  Lifestyle  . Physical activity    Days per week: Not on file    Minutes per session: Not on file  . Stress: Not on file  Relationships  . Social Herbalist on phone: Not on file    Gets together: Not on file    Attends religious service: Not on file    Active member of club or organization: Not on file    Attends meetings of clubs or organizations: Not on file    Relationship status: Not on file  Other Topics Concern  . Not on file  Social History Narrative   Right handed.         Objective:  Physical Exam: BP 136/78 (BP Location: Left Arm, Patient Position: Sitting, Cuff Size: Normal)   Pulse 91   Temp 98.4 F (36.9 C) (Oral)   Ht 5' 3"  (1.6 m)   Wt 210 lb 4 oz (95.4 kg)   SpO2 99%   BMI 37.24 kg/m   Body mass index is 37.24 kg/m. Wt Readings from Last 3 Encounters:  07/31/18 210 lb 4 oz (95.4 kg)  07/17/18 210 lb (95.3 kg)  06/19/18 213 lb (96.6 kg)  Gen: NAD, resting comfortably HEENT: TMs normal bilaterally. OP clear. No thyromegaly noted.  Small hordeolum noted ~lateral aspect of left upper eyelid. CV: RRR with no murmurs appreciated Pulm:  NWOB, CTAB with no crackles, wheezes, or rhonchi GI: Normal bowel sounds present. Soft, Nontender, Nondistended.  Small umbilical hernia appreciated.  Easily reducible. MSK: no edema, cyanosis, or clubbing noted Skin: warm, dry Neuro: CN2-12 grossly intact. Strength 5/5 in upper and lower extremities. Reflexes symmetric and intact bilaterally.  Psych: Normal affect and thought content     Castulo Scarpelli M. Jerline Pain, MD 07/31/2018 4:39 PM

## 2018-07-31 NOTE — Assessment & Plan Note (Signed)
Continue management per psychiatry.  Continue Ambien 5 mg nightly as needed.

## 2018-07-31 NOTE — Patient Instructions (Addendum)
It was very nice to see you today!  Please try the baclofen for your back and neck.  I will also send in a new prescription for you to get your glucometer.  I will send in a prescription for albuterol inhaler.  We will set you up to have iron infusions.  I will place a referral for you to see the ophthalmologist for your eyelid.   Come back to see me in 3 months to recheck your A1c, or sooner as needed.   Eat at least 3 REAL meals and 1-2 snacks per day.  Aim for no more than 5 hours between eating.  Eat breakfast within one hour of getting up.    Obtain twice as many fruits/vegetables as protein or carbohydrate foods for both lunch and dinner.   Cut down on sweet beverages. This includes juice, soda, and sweet tea.    Exercise at least 150 minutes every week.    Take care, Dr Jerline Pain

## 2018-07-31 NOTE — Progress Notes (Signed)
Chief Complaint:  Heidi Holland is a 43 y.o. female who presents today with a chief complaint of hordeolum and to establish care.   Assessment/Plan:  Hordeolum Will place referral to ophthalmology.   T2DM (type 2 diabetes mellitus) ([place]) We will not adjust medications today she has no glucose readings and does not know her most recent A1c.  Obtain records from previous PCP.  She will return in 3 months to get A1c checked.  Continue metformin 1000 mg twice daily and Jardiance 10 mg daily.  Dyslipidemia associated with type 2 diabetes mellitus ([facility]) Continue Crestor 40 mg daily.  Obtain records from most recent PCP.  Allergic rhinitis Continue cetirizine 10 mg daily and Singulair 10 mg daily.  Mild intermittent asthma, uncomplicated Stable.  Albuterol inhaler refilled.  Iron deficiency anemia She has had difficulty maintaining normal iron stores because of her gastric bypass.  We will set up to have iron infusions done.  GERD (gastroesophageal reflux disease) Continue Protonix 40 mg daily.  Migraine Continue management per neurology.  Will refill Imitrex per patient request.  Also send in prescription for baclofen -she has not tried this before and is interested to see if this would help with her cervicogenic headaches.  Polyarthralgia Continue management per sports medicine.  Anxiety Continue management per psychiatry.  Continue Trintellix, Klonopin, and Rexulti.  Depression, major, single episode, complete remission ([facility]) Stable.  Continue management per psychiatry.  Continue Trintellix and Rexulti.  Insomnia Continue management per psychiatry.  Continue Ambien 5 mg nightly as needed.  Umbilical hernia No red flags.  Offered surgical referral however patient declined.    Subjective:  HPI:  She has had worsening lesion to the corner of her left eye for the past several years.  Area seems to be increasing in size.  No pain.  No drainage.  Sometimes makes it difficult  to close her eyes fully.  She has numerous chronic medical conditions outlined below:  # T2DM - On metformin 2067m daily, jardiance 140mdaily -Does not have a functioning glucometer at home-does not check home blood sugar. - Recently had A1c checked at [facility] Does not know her readings. - ROS: No reported polyuria polydipsia  # Dyslipidemia - On crestor 4046maily and tolerating well - ROS: No reported myalgias.  # Allergic Rhinitis / Asthma - On cetirizine 54m67mily and singulair 54mg78mly - Uses albuterol inhaler as needed - No recent flares  # Iron Deficiency - On oral iron supplementation but she is not sure if it is working  # GERD / Nausea / s/p gastric bypass - On protonix 40mg 84my and tolerating well - Takes zofran 4mg as20meded  % Migraine Disorder - Follows with neurology - On Aimovig and takes sumitriptan as needed - She would like to switch back to oral sumatriptan as she thinks this works better -Also uses muscle relaxers as needed as she has a lot of tension in her neck which she thinks contributes to her symptoms.  She has tried Zanaflex and Flexeril in the past with no significant improvement.  % Polyarthralgia - Follows with sports medicine - On Vitamin D supplementation -On gabapentin 600 mg 3 times daily.  % Anxiety / Depression / Insomnia - Follows with psychiatry - On trintellix 54mg da30m klonopin 0.5mg as n51med, Rexulti 0.5mg daily25mTakes ambien 5mg nightl84ms needed for sleep - ROS: No reported SI or HI.  % Umbilical Hernia  She had surgical repair several years ago.  Has noticed increasing bulging area.   ROS: Per HPI, otherwise a complete review of systems was negative.   PMH:  The following were reviewed and entered/updated in epic: Past Medical History:  Diagnosis Date   Allergy    Anxiety    Asthma    Depression    Diabetes mellitus without complication ([place])    Diverticulitis    GERD (gastroesophageal  reflux disease)    HPV in female    Hyperlipidemia    Migraines    Patient Active Problem List   Diagnosis Date Noted   T2DM (type 2 diabetes mellitus) ([place]) 07/31/2018   Dyslipidemia associated with type 2 diabetes mellitus ([place]) 07/31/2018   Allergic rhinitis 07/31/2018   Mild intermittent asthma, uncomplicated 16/11/9602   GERD (gastroesophageal reflux disease) 07/31/2018   S/P gastric bypass 07/31/2018   Migraine 07/31/2018   Anxiety 07/31/2018   Depression, major, single episode, complete remission ([place]) 07/31/2018   Insomnia 54/10/8117   Umbilical hernia 14/78/2956   Gluteal tendinitis of left buttock 07/17/2018   Nonallopathic lesion of cervical region 06/19/2018   Nonallopathic lesion of thoracic region 06/19/2018   Nonallopathic lesion of sacral region 06/19/2018   Nonallopathic lesion of rib cage 06/19/2018   Nonallopathic lesion of lumbosacral region 06/19/2018   Iron deficiency anemia 06/09/2018   Cervicogenic headache 05/12/2018   Low back pain 05/12/2018   Polyarthralgia 05/12/2018   Past Surgical History:  Procedure Laterality Date   BOWEL RESECTION     gasticbypass     GASTRIC BYPASS      Family History  Problem Relation Age of Onset   Heart disease Mother    Lung cancer Mother    Rheum arthritis Mother    Diabetes Father    Heart disease Father    Breast cancer Paternal Aunt     Medications- reviewed and updated Current Outpatient Medications  Medication Sig Dispense Refill   acetaminophen (TYLENOL) 325 MG tablet Take 650 mg by mouth every 6 (six) hours as needed for mild pain.     cetirizine (ZYRTEC) 10 MG tablet      clonazePAM (KLONOPIN) 0.5 MG tablet 0.5 mg as needed.      Erenumab-aooe (AIMOVIG) 70 MG/ML SOAJ Inject 70 mg into the skin every 30 (thirty) days. 1 pen 11   Erenumab-aooe (AIMOVIG) 70 MG/ML SOAJ Inject 70 mg into the skin every 30 (thirty) days. 1 pen 11   gabapentin (NEURONTIN) 600 MG  tablet 3 (three) times daily.      IRON-VITAMIN C PO Take by mouth.     JARDIANCE 10 MG TABS tablet Take 10 mg by mouth daily.     metFORMIN (GLUCOPHAGE) 500 MG tablet Take 2,000 mg by mouth daily with breakfast.      montelukast (SINGULAIR) 10 MG tablet Take 10 mg by mouth at bedtime.     ondansetron (ZOFRAN ODT) 4 MG disintegrating tablet Take 1 tablet (4 mg total) by mouth every 8 (eight) hours as needed for nausea or vomiting. 20 tablet 0   pantoprazole (PROTONIX) 40 MG tablet Take 40 mg by mouth every evening.     REXULTI 0.5 MG TABS Take 1 tablet by mouth daily.     rosuvastatin (CRESTOR) 40 MG tablet Take 40 mg by mouth daily.     Vitamin D, Ergocalciferol, (DRISDOL) 1.25 MG (50000 UT) CAPS capsule TAKE 1 CAPSULE BY MOUTH EVERY 7 DAYS 12 capsule 0   vortioxetine HBr (TRINTELLIX) 10 MG TABS tablet Take 10 mg by mouth daily.  albuterol (VENTOLIN HFA) 108 (90 Base) MCG/ACT inhaler Inhale 2 puffs into the lungs every 6 (six) hours as needed for wheezing or shortness of breath. 18 g 1   baclofen (LIORESAL) 10 MG tablet Take 1 tablet (10 mg total) by mouth 3 (three) times daily. 30 each 0   blood glucose meter kit and supplies KIT Dispense based on patient and insurance preference. Use up to four times daily as directed. (FOR ICD-9 250.00, 250.01). 1 each 0   SUMAtriptan (IMITREX) 50 MG tablet Take 1 tablet (50 mg total) by mouth every 2 (two) hours as needed for migraine. May repeat in 2 hours if headache persists or recurs. 10 tablet 0   No current facility-administered medications for this visit.     Allergies-reviewed and updated Allergies  Allergen Reactions   Ciprofloxacin Itching    Social History   Socioeconomic History   Marital status: Single    Spouse name: Not on file   Number of children: 0   Years of education: 14   Highest education level: Associate degree: occupational, Hotel manager, or vocational program  Occupational History    Employer: [employer]    Social Designer, fashion/clothing strain: Not on file   Food insecurity    Worry: Not on file    Inability: Not on file   Transportation needs    Medical: Not on file    Non-medical: Not on file  Tobacco Use   Smoking status: Never Smoker   Smokeless tobacco: Never Used  Substance and Sexual Activity   Alcohol use: Yes    Frequency: Never    Comment: Occ   Drug use: Yes    Comment: THC   Sexual activity: Never  Lifestyle   Physical activity    Days per week: Not on file    Minutes per session: Not on file   Stress: Not on file  Relationships   Social connections    Talks on phone: Not on file    Gets together: Not on file    Attends religious service: Not on file    Active member of club or organization: Not on file    Attends meetings of clubs or organizations: Not on file    Relationship status: Not on file  Other Topics Concern   Not on file  Social History Narrative   Right handed.          Objective:  Physical Exam: BP 136/78 (BP Location: Left Arm, Patient Position: Sitting, Cuff Size: Normal)    Pulse 91    Temp 98.4 F (36.9 C) (Oral)    Ht 5' 3"  (1.6 m)    Wt 210 lb 4 oz (95.4 kg)    SpO2 99%    BMI 37.24 kg/m   Gen: NAD, resting comfortably HEENT: Hordeolum on lateral aspect of left upper eyelid. CV: Regular rate and rhythm with no murmurs appreciated Pulm: Normal work of breathing, clear to auscultation bilaterally with no crackles, wheezes, or rhonchi GI: Normal bowel sounds present. Soft, Nontender, Nondistended.umbilical hernia noted, easily reducible.  MSK: No edema, cyanosis, or clubbing noted Skin: Warm, dry Neuro: Grossly normal, moves all extremities Psych: Normal affect and thought content      Glinda Natzke M. Jerline Pain, MD 07/31/2018 4:41 PM

## 2018-07-31 NOTE — Assessment & Plan Note (Signed)
We will not adjust medications today she has no glucose readings and does not know her most recent A1c.  Obtain records from previous PCP.  She will return in 3 months to get A1c checked.  Continue metformin 1000 mg twice daily and Jardiance 10 mg daily.

## 2018-07-31 NOTE — Assessment & Plan Note (Signed)
No red flags.  Offered surgical referral however patient declined.

## 2018-07-31 NOTE — Assessment & Plan Note (Signed)
Continue Protonix 40 mg daily

## 2018-07-31 NOTE — Assessment & Plan Note (Signed)
Continue management per psychiatry.  Continue Trintellix, Klonopin, and Rexulti.

## 2018-07-31 NOTE — Assessment & Plan Note (Signed)
Stable.  Continue management per psychiatry.  Continue Trintellix and Rexulti.

## 2018-07-31 NOTE — Assessment & Plan Note (Signed)
Continue cetirizine 10 mg daily and Singulair 10 mg daily.

## 2018-07-31 NOTE — Assessment & Plan Note (Signed)
Continue management per neurology.  Will refill Imitrex per patient request.  Also send in prescription for baclofen -she has not tried this before and is interested to see if this would help with her cervicogenic headaches.

## 2018-08-01 ENCOUNTER — Encounter: Payer: Self-pay | Admitting: Family Medicine

## 2018-08-08 ENCOUNTER — Ambulatory Visit (INDEPENDENT_AMBULATORY_CARE_PROVIDER_SITE_OTHER)
Admission: RE | Admit: 2018-08-08 | Discharge: 2018-08-08 | Disposition: A | Discharge status: Home / Self Care | Payer: BLUE CROSS/BLUE SHIELD | Source: Ambulatory Visit | Attending: Family Medicine | Admitting: Family Medicine

## 2018-08-08 ENCOUNTER — Other Ambulatory Visit: Payer: Self-pay

## 2018-08-08 ENCOUNTER — Encounter: Payer: Self-pay | Admitting: Family Medicine

## 2018-08-08 ENCOUNTER — Ambulatory Visit (INDEPENDENT_AMBULATORY_CARE_PROVIDER_SITE_OTHER): Payer: BLUE CROSS/BLUE SHIELD | Admitting: Family Medicine

## 2018-08-08 ENCOUNTER — Other Ambulatory Visit: Payer: Self-pay | Admitting: Family Medicine

## 2018-08-08 VITALS — BP 120/78 | HR 82 | Ht 63.0 in | Wt 209.2 lb

## 2018-08-08 DIAGNOSIS — M25552 Pain in left hip: Secondary | ICD-10-CM

## 2018-08-08 DIAGNOSIS — M5442 Lumbago with sciatica, left side: Secondary | ICD-10-CM

## 2018-08-08 DIAGNOSIS — G8929 Other chronic pain: Secondary | ICD-10-CM | POA: Diagnosis not present

## 2018-08-08 DIAGNOSIS — M999 Biomechanical lesion, unspecified: Secondary | ICD-10-CM

## 2018-08-08 DIAGNOSIS — M25852 Other specified joint disorders, left hip: Secondary | ICD-10-CM | POA: Diagnosis not present

## 2018-08-08 DIAGNOSIS — M545 Low back pain: Secondary | ICD-10-CM | POA: Diagnosis not present

## 2018-08-08 MED ORDER — BACLOFEN 10 MG PO TABS: 10.0000 mg | ORAL_TABLET | Freq: Three times a day (TID) | ORAL | 1 refills | Status: DC

## 2018-08-08 NOTE — Assessment & Plan Note (Signed)
Patient does have more of a gluteal tendinitis.  Patient was given injection with some very mild improvement but nothing severe.  Continues to have this pain and would like to send patient to formal physical therapy.  X-rays of patient's hip and back ordered today.  Has had a history of MRIs previously.  We discussed with patient and icing regimen, core strengthening and weight loss.  Baclofen seems to be helping and given a refill.  Follow-up again in 4 to 6 weeks

## 2018-08-08 NOTE — Patient Instructions (Signed)
Have referred you to PT at [facility].  They will contact you to schedule.  Please see me again in 5-6 weeks.

## 2018-08-08 NOTE — Progress Notes (Signed)
[facility] [address], [place] [address] Phone: [phone] Subjective:    I'm seeing this patient by the request  of:    CC: LBP and L leg pain  I, Heidi Holland, PT, LAT, ATC am serving as scribe for Dr. Hulan Saas.  ZNB:VAPOLIDCVU    07/17/18: Patient given injection today.  Tolerated the procedure well.  Discussed icing regimen and home exercises.  Discussed which activities of doing which was to avoid.  Patient is to increase activity slowly.  Follow-up again in 4 to 6 weeks  Update 08/08/18:  Heidi Holland is a 43 y.o. female coming in with complaint of LBP and L leg pain.  Pt states that her symptoms are about the same.  She notes a sharp, pinching sensation in her L groin that will radiate into her L leg when she bears weight on her L LE.  She reports that she only had temporary relief w/ the L piriformis injection from her last appt.  She states that she has been doing her HEP but feels like her mobility has not improved w/ theese.  She does use the tennis ball self massage.  She states that she saw Dr. Jerline Pain who switched her to Baclofen which she likes better and is wondering if she can get a longer script for this as well as a possible referral to pain management.        Past Medical History:  Diagnosis Date  . Allergy   . Anxiety   . Asthma   . Depression   . Diabetes mellitus without complication ([place])   . Diverticulitis   . GERD (gastroesophageal reflux disease)   . HPV in female   . Hyperlipidemia   . Migraines    Past Surgical History:  Procedure Laterality Date  . BOWEL RESECTION    . gasticbypass    . GASTRIC BYPASS    . KNEE ARTHROSCOPY     Social History   Socioeconomic History  . Marital status: Single    Spouse name: Not on file  . Number of children: 0  . Years of education: 59  . Highest education level: Associate degree: occupational, Hotel manager, or vocational program  Occupational History   Employer: [employer]  Social Needs  . Financial resource strain: Not on file  . Food insecurity    Worry: Not on file    Inability: Not on file  . Transportation needs    Medical: Not on file    Non-medical: Not on file  Tobacco Use  . Smoking status: Never Smoker  . Smokeless tobacco: Never Used  Substance and Sexual Activity  . Alcohol use: Yes    Frequency: Never    Comment: Occ  . Drug use: Yes    Comment: THC  . Sexual activity: Never  Lifestyle  . Physical activity    Days per week: Not on file    Minutes per session: Not on file  . Stress: Not on file  Relationships  . Social Herbalist on phone: Not on file    Gets together: Not on file    Attends religious service: Not on file    Active member of club or organization: Not on file    Attends meetings of clubs or organizations: Not on file    Relationship status: Not on file  Other Topics Concern  . Not on file  Social History Narrative   Right handed.  Allergies  Allergen Reactions  . Ciprofloxacin Itching   Family History  Problem Relation Age of Onset  . Heart disease Mother   . Lung cancer Mother   . Rheum arthritis Mother   . Arthritis Mother   . Cancer Mother   . COPD Mother   . Miscarriages / [place] Mother   . Diabetes Father   . Heart disease Father   . Breast cancer Paternal Aunt     Current Outpatient Medications (Endocrine & Metabolic):  Marland Kitchen  JARDIANCE 10 MG TABS tablet, Take 10 mg by mouth daily. .  metFORMIN (GLUCOPHAGE) 500 MG tablet, Take 2,000 mg by mouth daily with breakfast.   Current Outpatient Medications (Cardiovascular):  .  rosuvastatin (CRESTOR) 40 MG tablet, Take 40 mg by mouth daily.  Current Outpatient Medications (Respiratory):  .  albuterol (VENTOLIN HFA) 108 (90 Base) MCG/ACT inhaler, Inhale 2 puffs into the lungs every 6 (six) hours as needed for wheezing or shortness of breath. .  cetirizine (ZYRTEC) 10 MG tablet,  .  montelukast (SINGULAIR) 10 MG tablet,  Take 10 mg by mouth at bedtime.  Current Outpatient Medications (Analgesics):  .  acetaminophen (TYLENOL) 325 MG tablet, Take 650 mg by mouth every 6 (six) hours as needed for mild pain. Eduard Roux (AIMOVIG) 70 MG/ML SOAJ, Inject 70 mg into the skin every 30 (thirty) days. Eduard Roux (AIMOVIG) 70 MG/ML SOAJ, Inject 70 mg into the skin every 30 (thirty) days. .  SUMAtriptan (IMITREX) 50 MG tablet, Take 1 tablet (50 mg total) by mouth every 2 (two) hours as needed for migraine. May repeat in 2 hours if headache persists or recurs.  Current Outpatient Medications (Hematological):  Marland Kitchen  IRON-VITAMIN C PO, Take by mouth.  Current Outpatient Medications (Other):  .  blood glucose meter kit and supplies KIT, Dispense based on patient and insurance preference. Use up to four times daily as directed. (FOR ICD-9 250.00, 250.01). .  clonazePAM (KLONOPIN) 0.5 MG tablet, 0.5 mg as needed.  .  gabapentin (NEURONTIN) 600 MG tablet, 3 (three) times daily.  .  ondansetron (ZOFRAN ODT) 4 MG disintegrating tablet, Take 1 tablet (4 mg total) by mouth every 8 (eight) hours as needed for nausea or vomiting. .  pantoprazole (PROTONIX) 40 MG tablet, Take 40 mg by mouth every evening. Marland Kitchen  REXULTI 0.5 MG TABS, Take 1 tablet by mouth daily. .  Vitamin D, Ergocalciferol, (DRISDOL) 1.25 MG (50000 UT) CAPS capsule, TAKE 1 CAPSULE BY MOUTH EVERY 7 DAYS .  vortioxetine HBr (TRINTELLIX) 10 MG TABS tablet, Take 10 mg by mouth daily. .  baclofen (LIORESAL) 10 MG tablet, Take 1 tablet (10 mg total) by mouth 3 (three) times daily.    Past medical history, social, surgical and family history all reviewed in electronic medical record.  No pertanent information unless stated regarding to the chief complaint.   Review of Systems:  No , visual changes, nausea, vomiting, diarrhea, constipation, dizziness, abdominal pain, skin rash, fevers, chills, night sweats, weight loss, swollen lymph nodes, body aches, joint swelling,   chest pain, shortness of breath, mood changes.  Positive muscle aches headache  Objective  Blood pressure 120/78, pulse 82, height _0  (1.6 m), weight 209 lb 3.2 oz (94.9 kg), last menstrual period 08/01/2018, SpO2 98 %.    General: No apparent distress alert and oriented x3 mood and affect normal, dressed appropriately.  HEENT: Pupils equal, extraocular movements intact  Respiratory: Patient's speak in full sentences and does not appear  short of breath  Cardiovascular: No lower extremity edema, non tender, no erythema  Skin: Warm dry intact with no signs of infection or rash on extremities or on axial skeleton.  Abdomen: Soft nontender  Neuro: Cranial nerves II through XII are intact, neurovascularly intact in all extremities with 2+ DTRs and 2+ pulses.  Lymph: No lymphadenopathy of posterior or anterior cervical chain or axillae bilaterally.  Gait mild antalgic.  MSK:  tender with full range of motion and good stability and symmetric strength and tone of shoulders, elbows, wrist, knee and ankles bilaterally.  Patient significantly improved from very mild loss.  Range of motion.  Patient does have loss of lordosis.  Tightness of the paraspinal musculature.  Negative Spurling's.  Tightness of the trapezius bilaterally.  Patient is also having some mild low back pain.  No acidosis.  Pain over the left sacroiliac joint.  Positive Faber test.  Mild pain over the gluteal tendon.  Osteopathic findings C4 flexed rotated and side bent left C7 flexed rotated and side bent left T3 extended rotated and side bent right inhaled third rib T9 extended rotated and side bent left L1 flexed rotated and side bent right Sacrum left on left    Impression and Recommendations:     This case required medical decision making of moderate complexity. The above documentation has been reviewed and is accurate and complete Lyndal Pulley, DO       Note: This dictation was prepared with Dragon dictation  along with smaller phrase technology. Any transcriptional errors that result from this process are unintentional.

## 2018-08-08 NOTE — Assessment & Plan Note (Signed)
Decision today to treat with OMT was based on Physical Exam  After verbal consent patient was treated with HVLA, ME, FPR techniques in cervical, thoracic, rib lumbar and sacral areas  Patient tolerated the procedure well with improvement in symptoms  Patient given exercises, stretches and lifestyle modifications  See medications in patient instructions if given  Patient will follow up in 4-6 weeks 

## 2018-08-13 ENCOUNTER — Telehealth: Payer: Self-pay

## 2018-08-13 NOTE — Telephone Encounter (Signed)
Copied from [place] [id number]. Topic: General - Other >> Aug 13, 2018  2:55 PM Mcneil, Ja-Kwan wrote: Reason for CRM: Pt requests a Rx for [employer] and monitor kit. Pt stated she would like to pick up the Rx tomorrow if possible.

## 2018-08-13 NOTE — Telephone Encounter (Signed)
Copied from [place] [id number]. Topic: Referral - Status >> Aug 13, 2018  2:50 PM Mcneil, Ja-Kwan wrote: Reason for CRM: Pt stated she has not heard from anyone regarding the referral for iron infusion. Pt requests call back.

## 2018-08-14 ENCOUNTER — Other Ambulatory Visit: Payer: Self-pay

## 2018-08-14 DIAGNOSIS — D509 Iron deficiency anemia, unspecified: Secondary | ICD-10-CM

## 2018-08-14 MED ORDER — FREESTYLE LIBRE READER DEVI: 1.0000 | Freq: Every day | 0 refills | Status: DC | PRN

## 2018-08-14 MED ORDER — FREESTYLE LIBRE SENSOR SYSTEM MISC: 11 refills | Status: DC

## 2018-08-14 NOTE — Telephone Encounter (Signed)
Patient notified

## 2018-08-14 NOTE — Telephone Encounter (Signed)
Order has been placed. Please contact the [facility] to schedule visits for patient and then contact pt to advise. Pt may call to r/s if needed.

## 2018-08-14 NOTE — Telephone Encounter (Signed)
Ok with me. Please place any necessary orders. 

## 2018-08-14 NOTE — Telephone Encounter (Signed)
Can we please get pt set up for iron infusions? Thanks! -CMP

## 2018-08-14 NOTE — Telephone Encounter (Signed)
Patient requesting new Rx  [facility]

## 2018-08-14 NOTE — Telephone Encounter (Signed)
Rx written and printed.

## 2018-08-14 NOTE — Telephone Encounter (Signed)
Do you want  Any pre meds or feraheme

## 2018-08-14 NOTE — Telephone Encounter (Signed)
Ok with feraheme. Does not need pre meds.  Algis Greenhouse. Jerline Pain, MD 08/14/2018 11:53 AM

## 2018-08-14 NOTE — Telephone Encounter (Signed)
Forwarding

## 2018-08-18 ENCOUNTER — Ambulatory Visit: Payer: BLUE CROSS/BLUE SHIELD | Attending: Family Medicine | Admitting: Physical Therapy

## 2018-08-18 ENCOUNTER — Other Ambulatory Visit: Payer: Self-pay

## 2018-08-18 ENCOUNTER — Encounter: Payer: Self-pay | Admitting: Physical Therapy

## 2018-08-18 DIAGNOSIS — M5442 Lumbago with sciatica, left side: Secondary | ICD-10-CM | POA: Diagnosis not present

## 2018-08-18 DIAGNOSIS — M25552 Pain in left hip: Secondary | ICD-10-CM | POA: Diagnosis not present

## 2018-08-18 NOTE — Therapy (Addendum)
[address] [place], [place], [address] Phone: [phone]   Fax:  [phone]  Physical Therapy Evaluation  Patient Details  Name: Heidi Holland MRN: [id number] Date of Birth: 09-13-1975 Referring Provider (PT): Creig Hines   Encounter Date: 08/18/2018  PT End of Session - 08/18/18 1147    Visit Number  1    Number of Visits  39    Date for PT Re-Evaluation  10/19/18    PT Start Time  0810    PT Stop Time  0855    PT Time Calculation (min)  45 min    Activity Tolerance  Patient tolerated treatment well    Behavior During Therapy  [facility] for tasks assessed/performed       Past Medical History:  Diagnosis Date  . Allergy   . Anxiety   . Asthma   . Depression   . Diabetes mellitus without complication ([place])   . Diverticulitis   . GERD (gastroesophageal reflux disease)   . HPV in female   . Hyperlipidemia   . Migraines     Past Surgical History:  Procedure Laterality Date  . BOWEL RESECTION    . gasticbypass    . GASTRIC BYPASS    . KNEE ARTHROSCOPY      There were no vitals filed for this visit.   Subjective Assessment - 08/18/18 0818    Subjective  Patient reports that she has had some left hip and back pain since 2018.  REports that over this time she has had increased pain in the left hip, difficulty crossing legs and puttin gon shoes and socks.  X-rays show DDD of the lumbar area and some degenerative cahnges int he hip.  MRI from 2018 showed some disc bulges    Limitations  Lifting;House hold activities    Patient Stated Goals  have less pain, easier movements    Currently in Pain?  Yes    Pain Score  5     Pain Location  Hip   and the left low back and buttock   Pain Orientation  Left    Pain Descriptors / Indicators  Aching;Sharp;Cramping    Pain Type  Acute pain    Pain Radiating Towards  pain into the left groin and left ITB    Pain Onset  More than a month ago    Pain Frequency   Constant    Aggravating Factors   trying to cross legs, putting on shoes, standing pain up to 10/10    Pain Relieving Factors  massage helps, ice pain at best a 5/10    Effect of Pain on Daily Activities  difficulty dressing, difficulty working         [facility] PT Assessment - 08/18/18 0001      Assessment   Medical Diagnosis  left hip and low back pain    Referring Provider (PT)  Z. Smith    Onset Date/Surgical Date  07/19/18      Precautions   Precautions  None      Balance Screen   Has the patient fallen in the past 6 months  No    Has the patient had a decrease in activity level because of a fear of falling?   No    Is the patient reluctant to leave their home because of a fear of falling?   No      Home Environment   Additional Comments  has stairs, does  housework      Prior Function   Level of Independence  Independent    Vocation  Full time employment    Vocation Requirements  standing in retail, some lifting at times    Leisure  no exercise      Posture/Postural Control   Posture Comments  fwd head, rounded shoulders, decreased lordosis      ROM / Strength   AROM / PROM / Strength  AROM;Strength      AROM   Overall AROM Comments  Lumbar ROM decreased 50% with pain in the left low back, ,  LEft hip WFL's except ER is 13 degrees with pain in the left groin      Strength   Overall Strength Comments  4-/5 with some pain in the left groin and the left low back      Flexibility   Soft Tissue Assessment /Muscle Length  yes    Hamstrings  tight    Quadriceps  mild tightness    ITB  very tight    Piriformis  very tight especially on the left      Palpation   Palpation comment  tender in the left low back and buttock, she is very tight with knots in the left ITB and tender in the left groin      Special Tests   Other special tests  negative hip scour      Ambulation/Gait   Gait Comments  has a slight antalgic gait on the left                Objective  measurements completed on examination: See above findings.      [facility] Adult PT Treatment/Exercise - 08/18/18 0001      Modalities   Modalities  Cryotherapy;Electrical Stimulation      Cryotherapy   Number Minutes Cryotherapy  15 Minutes    Cryotherapy Location  Hip    Type of Cryotherapy  Ice pack      Electrical Stimulation   Electrical Stimulation Location  left buttock to the ITB    Electrical Stimulation Action  IFC    Electrical Stimulation Parameters  right sidelying    Electrical Stimulation Goals  Pain             PT Education - 08/18/18 1147    Education Details  HS, piriformis and ITB stretches    Person(s) Educated  Patient    Methods  Explanation;Demonstration;Handout    Comprehension  Verbalized understanding       PT Short Term Goals - 08/18/18 1152      PT SHORT TERM GOAL #1   Title  independent with initial HEP    Time  2    Period  Weeks    Status  New        PT Long Term Goals - 08/18/18 1152      PT LONG TERM GOAL #1   Title  understand posture and body mechanics    Time  8    Period  Weeks    Status  New      PT LONG TERM GOAL #2   Title  decrease pain 50%    Time  8    Period  Weeks      PT LONG TERM GOAL #3   Title  increase lumbar ROM 50%    Time  9    Period  Weeks    Status  New      PT LONG TERM  GOAL #4   Title  report no difficulty putting shoes and socks on    Time  8    Period  Weeks    Status  New             Plan - 08/18/18 1148    Clinical Impression Statement  Patient reports back and left hip pain, x-rays show DDD and DJD, an MRI from 2018 shows bulging discs.  She reports that over the past few months she has ost ROM of the lft hip to where she is having difficulty putting on shoes and socks.  She is very tight and tender in the left buttock and the left ITB, she does report pain in the left groin.  She has mm tightness and soreness in the upper traps and the neck    Stability/Clinical Decision Making   Evolving/Moderate complexity    Clinical Decision Making  Low    Rehab Potential  Good    PT Frequency  2x / week    PT Duration  8 weeks    PT Treatment/Interventions  ADLs/Self Care Home Management;Cryotherapy;Electrical Stimulation;Iontophoresis 67m/ml Dexamethasone;Moist Heat;Traction;Ultrasound;Therapeutic exercise;Therapeutic activities;Patient/family education;Manual techniques;Dry needling    PT Next Visit Plan  may really try rolling out the ITB    Consulted and Agree with Plan of Care  Patient       Patient will benefit from skilled therapeutic intervention in order to improve the following deficits and impairments:  Abnormal gait, Improper body mechanics, Pain, Postural dysfunction, Increased muscle spasms, Decreased activity tolerance, Decreased range of motion, Decreased strength, Impaired flexibility, Difficulty walking  Visit Diagnosis: 1. Pain in left hip   2. Acute left-sided low back pain with left-sided sciatica        Problem List Patient Active Problem List   Diagnosis Date Noted  . T2DM (type 2 diabetes mellitus) ([facility] 07/31/2018  . Dyslipidemia associated with type 2 diabetes mellitus ([facility] 07/31/2018  . Allergic rhinitis 07/31/2018  . Mild intermittent asthma, uncomplicated 082/50/5397 . GERD (gastroesophageal reflux disease) 07/31/2018  . S/P gastric bypass 07/31/2018  . Migraine 07/31/2018  . Anxiety 07/31/2018  . Depression, major, single episode, complete remission ([facility] 07/31/2018  . Insomnia 07/31/2018  . Umbilical hernia 067/34/1937 . Gluteal tendinitis of left buttock 07/17/2018  . Nonallopathic lesion of cervical region 06/19/2018  . Nonallopathic lesion of thoracic region 06/19/2018  . Nonallopathic lesion of sacral region 06/19/2018  . Nonallopathic lesion of rib cage 06/19/2018  . Nonallopathic lesion of lumbosacral region 06/19/2018  . Iron deficiency anemia 06/09/2018  . Cervicogenic headache 05/12/2018  . Low back pain 05/12/2018  .  Polyarthralgia 05/12/2018    ASumner Boast, PT 08/18/2018, 11:57 AM  [address] [address] [address]Phone: [phone]  Fax:  [phone] Name: Heidi LagasseMRN: [id number] of Birth: [birth year]    PHYSICAL THERAPY DISCHARGE SUMMARY  Visits from Start of Care: 1  Plan: Patient agrees to discharge.  Patient goals were not met. Patient is being discharged due to not returning since the last visit.  ?????      LLyndee Hensen PT, DPT 10:19 AM  11/18/18

## 2018-08-19 NOTE — Telephone Encounter (Signed)
Scheduled pt for 08/25/18 @ 9:00 AM and 09/01/18 @ 9:00 AM.

## 2018-08-19 NOTE — Telephone Encounter (Signed)
Called pt and advised. Provided phone number and address to Patient Heidi Holland.

## 2018-08-21 ENCOUNTER — Ambulatory Visit: Payer: BLUE CROSS/BLUE SHIELD | Admitting: Physical Therapy

## 2018-08-22 ENCOUNTER — Telehealth: Payer: Self-pay | Admitting: Family Medicine

## 2018-08-22 NOTE — Telephone Encounter (Signed)
Please advise 

## 2018-08-22 NOTE — Telephone Encounter (Signed)
Patient called stating she would like PCP to prescribe her Zofran due to patient being nauseas. Patient call back [phone]   [id number] [address], [place] [phone] (Phone) [phone] (Fax)

## 2018-08-22 NOTE — Telephone Encounter (Signed)
Recommend OV.  Algis Greenhouse. Jerline Pain, MD 08/22/2018 4:50 PM

## 2018-08-22 NOTE — Telephone Encounter (Signed)
Please call patient to schedule office visit.  Can be virtual.  Thanks.

## 2018-08-22 NOTE — Telephone Encounter (Signed)
Called pt to schedule appt. No answer, LVM 

## 2018-08-25 ENCOUNTER — Other Ambulatory Visit: Payer: Self-pay | Admitting: Family Medicine

## 2018-08-25 ENCOUNTER — Ambulatory Visit (HOSPITAL_COMMUNITY)
Admission: RE | Admit: 2018-08-25 | Discharge: 2018-08-25 | Disposition: A | Discharge status: Home / Self Care | Payer: BLUE CROSS/BLUE SHIELD | Source: Ambulatory Visit | Attending: Family Medicine | Admitting: Family Medicine

## 2018-08-25 ENCOUNTER — Ambulatory Visit: Payer: BLUE CROSS/BLUE SHIELD | Admitting: Physical Therapy

## 2018-08-25 ENCOUNTER — Other Ambulatory Visit: Payer: Self-pay

## 2018-08-25 DIAGNOSIS — D509 Iron deficiency anemia, unspecified: Secondary | ICD-10-CM | POA: Insufficient documentation

## 2018-08-25 MED ORDER — SODIUM CHLORIDE 0.9 % IV SOLN
INTRAVENOUS | Status: DC | PRN
  Administered 2018-08-25: 250 mL via INTRAVENOUS

## 2018-08-25 MED ORDER — SODIUM CHLORIDE 0.9 % IV SOLN
510.0000 mg | INTRAVENOUS | Status: DC
  Administered 2018-08-25: 510 mg via INTRAVENOUS
  Filled 2018-08-25: qty 17

## 2018-08-25 NOTE — Discharge Instructions (Signed)

## 2018-08-25 NOTE — Progress Notes (Signed)
PATIENT CARE CENTER NOTE  Diagnosis: Iron deficiency anemia, unspecified iron deficiency anemia type (D50.9)   Provider: Dimas Chyle, MD   Procedure: IV Feraheme   Note: Patient received Feraheme infusion via PIV. Tolerated well. Observed patient for 30 minutes post-infusion. Vital signs stable. Discharge instructions given. Patient alert, oriented and ambulatory at discharge.

## 2018-08-26 ENCOUNTER — Ambulatory Visit: Payer: BLUE CROSS/BLUE SHIELD | Admitting: Physical Therapy

## 2018-08-27 ENCOUNTER — Other Ambulatory Visit: Payer: Self-pay | Admitting: Neurology

## 2018-08-27 DIAGNOSIS — D23121 Other benign neoplasm of skin of left upper eyelid, including canthus: Secondary | ICD-10-CM | POA: Diagnosis not present

## 2018-08-27 DIAGNOSIS — G43709 Chronic migraine without aura, not intractable, without status migrainosus: Secondary | ICD-10-CM

## 2018-08-28 ENCOUNTER — Other Ambulatory Visit: Payer: Self-pay | Admitting: Physical Therapy

## 2018-08-28 DIAGNOSIS — G8929 Other chronic pain: Secondary | ICD-10-CM

## 2018-08-28 DIAGNOSIS — M25552 Pain in left hip: Secondary | ICD-10-CM

## 2018-08-28 MED ORDER — PREDNISONE 50 MG PO TABS: 50.0000 mg | ORAL_TABLET | Freq: Every day | ORAL | 0 refills | Status: AC

## 2018-09-01 ENCOUNTER — Encounter (HOSPITAL_COMMUNITY): Payer: BLUE CROSS/BLUE SHIELD

## 2018-09-01 DIAGNOSIS — Z20828 Contact with and (suspected) exposure to other viral communicable diseases: Secondary | ICD-10-CM | POA: Diagnosis not present

## 2018-09-02 ENCOUNTER — Other Ambulatory Visit: Payer: Self-pay | Admitting: Family Medicine

## 2018-09-03 ENCOUNTER — Encounter (HOSPITAL_COMMUNITY): Payer: BLUE CROSS/BLUE SHIELD

## 2018-09-04 NOTE — Progress Notes (Deleted)
NEUROLOGY FOLLOW UP OFFICE NOTE  Heidi Holland [id number]  HISTORY OF PRESENT ILLNESS: Heidi Holland is a 43 year old right-handed Caucasian woman with hypertension, hyperlipidemia, type 2 diabetes, PTSD/anxiety/depression, chronic low back pain who follows up for migraines.  UPDATE: For neck pain, she was referred to Dr. Tamala Julian of Sports Medicine for OMM.  Intensity:  *** Duration:  *** Frequency:  *** Frequency of abortive medication: *** Rescue therapy:  1.  Advil, 2.  Fioricet three hours later, 3. Maxalt the next day Current NSAIDS:  none Current analgesics:  Fioricet Current triptans:  Tosymra NS Current ergotamine:  none Current anti-emetic:  Zofran 60m Current muscle relaxants:  Tizanidine 449mPRN Current anti-anxiolytic:  Clonazepam, hydroxyzine Current sleep aide:  trazodone Current Antihypertensive medications:  hydroxyzine Current Antidepressant medications:  Trintellix, Rexulti Current Anticonvulsant medications:  Gabapentin 60030mhree times daily Current anti-CGRP:  Aimovig 10m44mrrent Vitamins/Herbal/Supplements:  none Current Antihistamines/Decongestants:  none Other therapy:  none Hormone/birth control:  none Other medications:  Seroquel  Caffeine:  1 venti cup of coffee daily, 2 cups of diet Coke daily Alcohol:  no Smoker:  no Diet:  Needs to improve water intake.  Skips meals.  Diet Coke daily Exercise:  Not routine Depression:  Poor.  Currently treated; Anxiety:  Poor.  Currently treated. Other pain:  Low back pain/lumbar radiculopathy Sleep hygiene:  Improved on medication  HISTORY: Onset:  Age 74 L63ation: occiput into neck or top of head Quality:  Pressure, throbbing Initial Intensity:  8/10.  She denies new headache, thunderclap headache  Aura:  no Premonitory Phase:  Wakes up with heavy eyelids and stiff neck. Postdrome:  "hangover effect" - exhausted, irritable, neck soreness Associated symptoms:  Nausea, sometimes vomiting, photophobia,  phonophobia, blurred vision.  She denies associated unilateral numbness or weakness. Initial Duration:  Usually 2-3 days (up to a week with various intensity) Initial Frequency:  6 times a month (15 days or more a month) Initial Frequency of abortive medication: something daily Triggers:  Caffeine withdrawal (coffee), menstrual period, when low back pain aggravated, perfumes Relieving factors:  Ice pack, lay in dark Activity:  aggravates She has been to the ED on a couple of occasions for severe intractable migraine.  Past NSAIDS:  Ibuprofen, Aleve, ASA Past analgesics:  Excedrin, Tylenol, Fioricet Past abortive triptans:  Sumatriptan tablet, Maxalt Past abortive ergotamine:  none Past muscle relaxants:  Flexeril, Robaxin Past anti-emetic:  Zofran, Phenergan Past antihypertensive medications:  "a blood pressure medication" Past antidepressant medications:  Effexor Past anticonvulsant medications:  topiramate Past anti-CGRP:  None Past vitamins/Herbal/Supplements:  none Past antihistamines/decongestants:  none Other past therapies:  Botox (effective)  She was told by her previous PCP that she had a transient ischemic attack in early 2019.  She was walking and her legs suddenly gave out and her arms weren't working.  She was on the floor for a few minutes.  No unilateral numbness or weakness.  When she got up, she called a friend who tested her on the phone for TIA and symptoms resolved.  She had an MRI and MRA of the brain about a month later, on 07/24/17, which report states were normal.  She was given a diagnosis of TIA and has since been on ASA 325mg16mly.  My assessment is that this was not a TIA.  Unclear etiology.  Her mother has a history of recurrent TIAs while she was treated for lung cancer.  Family history of headache:  no  PAST MEDICAL HISTORY: Past  Medical History:  Diagnosis Date  . Allergy   . Anxiety   . Asthma   . Depression   . Diabetes mellitus without  complication ([place])   . Diverticulitis   . GERD (gastroesophageal reflux disease)   . HPV in female   . Hyperlipidemia   . Migraines     MEDICATIONS: Current Outpatient Medications on File Prior to Visit  Medication Sig Dispense Refill  . acetaminophen (TYLENOL) 325 MG tablet Take 650 mg by mouth every 6 (six) hours as needed for mild pain.    Marland Kitchen albuterol (VENTOLIN HFA) 108 (90 Base) MCG/ACT inhaler Inhale 2 puffs into the lungs every 6 (six) hours as needed for wheezing or shortness of breath. 18 g 1  . baclofen (LIORESAL) 10 MG tablet TAKE 1 TABLET BY MOUTH THREE TIMES A DAY 90 tablet 1  . blood glucose meter kit and supplies KIT Dispense based on patient and insurance preference. Use up to four times daily as directed. (FOR ICD-9 250.00, 250.01). 1 each 0  . cetirizine (ZYRTEC) 10 MG tablet     . clonazePAM (KLONOPIN) 0.5 MG tablet 0.5 mg as needed.     . Continuous Blood Gluc Receiver (FREESTYLE LIBRE READER) DEVI 1 Device by Does not apply route daily as needed (to check blood sugar). 1 Device 0  . Continuous Blood Gluc Sensor (FREESTYLE LIBRE SENSOR SYSTEM) MISC Apply 1 sensor to skin every 14 days. 2 each 11  . Erenumab-aooe (AIMOVIG) 70 MG/ML SOAJ Inject 70 mg into the skin every 30 (thirty) days. 1 pen 11  . Erenumab-aooe (AIMOVIG) 70 MG/ML SOAJ Inject 70 mg into the skin every 30 (thirty) days. 1 pen 11  . gabapentin (NEURONTIN) 600 MG tablet 3 (three) times daily.     Marland Kitchen IRON-VITAMIN C PO Take by mouth.    Marland Kitchen JARDIANCE 10 MG TABS tablet Take 10 mg by mouth daily.    . metFORMIN (GLUCOPHAGE) 500 MG tablet Take 2,000 mg by mouth daily with breakfast.     . montelukast (SINGULAIR) 10 MG tablet Take 10 mg by mouth at bedtime.    . ondansetron (ZOFRAN-ODT) 4 MG disintegrating tablet TAKE 1 TABLET BY MOUTH EVERY 8 HOURS AS NEEDED FOR NAUSEA AND VOMITING 20 tablet 1  . pantoprazole (PROTONIX) 40 MG tablet Take 40 mg by mouth every evening.    Marland Kitchen REXULTI 0.5 MG TABS Take 1 tablet by mouth  daily.    . rosuvastatin (CRESTOR) 40 MG tablet Take 40 mg by mouth daily.    . SUMAtriptan (IMITREX) 50 MG tablet TAKE 1 TABLET EVERY 2 HOURS AS NEEDED FOR MIGRAINE. MAY REPEAT IN 2 HRS IF HEADACHE PERSISTS/RECURS. 9 tablet 1  . Vitamin D, Ergocalciferol, (DRISDOL) 1.25 MG (50000 UT) CAPS capsule TAKE 1 CAPSULE BY MOUTH EVERY 7 DAYS 12 capsule 0  . vortioxetine HBr (TRINTELLIX) 10 MG TABS tablet Take 10 mg by mouth daily.     No current facility-administered medications on file prior to visit.     ALLERGIES: Allergies  Allergen Reactions  . Ciprofloxacin Itching    FAMILY HISTORY: Family History  Problem Relation Age of Onset  . Heart disease Mother   . Lung cancer Mother   . Rheum arthritis Mother   . Arthritis Mother   . Cancer Mother   . COPD Mother   . Miscarriages / [place] Mother   . Diabetes Father   . Heart disease Father   . Breast cancer Paternal Aunt     SOCIAL  HISTORY: Social History   Socioeconomic History  . Marital status: Single    Spouse name: Not on file  . Number of children: 0  . Years of education: 58  . Highest education level: Associate degree: occupational, Hotel manager, or vocational program  Occupational History    Employer: [employer]  Social Needs  . Financial resource strain: Not on file  . Food insecurity    Worry: Not on file    Inability: Not on file  . Transportation needs    Medical: Not on file    Non-medical: Not on file  Tobacco Use  . Smoking status: Never Smoker  . Smokeless tobacco: Never Used  Substance and Sexual Activity  . Alcohol use: Yes    Frequency: Never    Comment: Occ  . Drug use: Yes    Comment: THC  . Sexual activity: Never  Lifestyle  . Physical activity    Days per week: Not on file    Minutes per session: Not on file  . Stress: Not on file  Relationships  . Social Herbalist on phone: Not on file    Gets together: Not on file    Attends religious service: Not on file    Active member of  club or organization: Not on file    Attends meetings of clubs or organizations: Not on file    Relationship status: Not on file  . Intimate partner violence    Fear of current or ex partner: Not on file    Emotionally abused: Not on file    Physically abused: Not on file    Forced sexual activity: Not on file  Other Topics Concern  . Not on file  Social History Narrative   Right handed.     REVIEW OF SYSTEMS: Constitutional: No fevers, chills, or sweats, no generalized fatigue, change in appetite Eyes: No visual changes, double vision, eye pain Ear, nose and throat: No hearing loss, ear pain, nasal congestion, sore throat Cardiovascular: No chest pain, palpitations Respiratory:  No shortness of breath at rest or with exertion, wheezes GastrointestinaI: No nausea, vomiting, diarrhea, abdominal pain, fecal incontinence Genitourinary:  No dysuria, urinary retention or frequency Musculoskeletal:  No neck pain, back pain Integumentary: No rash, pruritus, skin lesions Neurological: as above Psychiatric: No depression, insomnia, anxiety Endocrine: No palpitations, fatigue, diaphoresis, mood swings, change in appetite, change in weight, increased thirst Hematologic/Lymphatic:  No purpura, petechiae. Allergic/Immunologic: no itchy/runny eyes, nasal congestion, recent allergic reactions, rashes  PHYSICAL EXAM: *** General: No acute distress.  Patient appears ***-groomed.   Head:  Normocephalic/atraumatic Eyes:  Fundi examined but not visualized Neck: supple, no paraspinal tenderness, full range of motion Heart:  Regular rate and rhythm Lungs:  Clear to auscultation bilaterally Back: No paraspinal tenderness Neurological Exam: alert and oriented to person, place, and time. Attention span and concentration intact, recent and remote memory intact, fund of knowledge intact.  Speech fluent and not dysarthric, language intact.  CN II-XII intact. Bulk and tone normal, muscle strength 5/5  throughout.  Sensation to light touch, temperature and vibration intact.  Deep tendon reflexes 2+ throughout, toes downgoing.  Finger to nose and heel to shin testing intact.  Gait normal, Romberg negative.  IMPRESSION: ***  PLAN: ***  Metta Clines, DO  CC: ***

## 2018-09-05 ENCOUNTER — Ambulatory Visit: Payer: BLUE CROSS/BLUE SHIELD | Admitting: Neurology

## 2018-09-05 ENCOUNTER — Other Ambulatory Visit: Payer: Self-pay

## 2018-09-06 ENCOUNTER — Ambulatory Visit (INDEPENDENT_AMBULATORY_CARE_PROVIDER_SITE_OTHER)
Admission: RE | Admit: 2018-09-06 | Discharge: 2018-09-06 | Disposition: A | Discharge status: Home / Self Care | Payer: BLUE CROSS/BLUE SHIELD | Source: Ambulatory Visit

## 2018-09-06 DIAGNOSIS — J0101 Acute recurrent maxillary sinusitis: Secondary | ICD-10-CM | POA: Diagnosis not present

## 2018-09-06 MED ORDER — METHYLPREDNISOLONE 4 MG PO TBPK: ORAL_TABLET | ORAL | 0 refills | Status: DC

## 2018-09-06 MED ORDER — AMOXICILLIN-POT CLAVULANATE 875-125 MG PO TABS: 1.0000 | ORAL_TABLET | Freq: Two times a day (BID) | ORAL | 0 refills | Status: DC

## 2018-09-06 NOTE — ED Provider Notes (Signed)
Virtual Visit via Video Note:  Heidi AhmadiSarah Skipper  initiated request for Telemedicine visit with [facility] team. I connected with Heidi AhmadiSarah Holland  on 09/06/2018 at 10:24 AM  for a synchronized telemedicine visit using a video enabled HIPPA compliant telemedicine application. I verified that I am speaking with Heidi Holland  using two identifiers. Tylisa Alcivar C Dashanti Burr, PA-C  was physically located [facility] a [facility] and Heidi Holland was located at a different location.   The limitations of evaluation and management by telemedicine as well as the availability of in-person appointments were discussed. Patient was informed that she  may incur a bill ( including co-pay) for this virtual visit encounter. Heidi Holland  expressed understanding and gave verbal consent to proceed with virtual visit.     History of Present Illness:Heidi Toni ArthursFuller  is a 43 y.o. female presents for evaluation of possible sinus infection.  Patient states that for the past 5 days she has had sinus congestion and pressure especially on the left side.  States that this is turning into ear discomfort as well.  She has had rhinorrhea.  She started to develop a slight cough, but most of her symptoms are related to her sinuses.  She reports low-grade fevers.  She notes that she has a history of recurrent sinusitis.  States that typically she needs antibiotics and steroids to help resolve her symptoms.  She has tried Sudafed and NyQuil with only temporary relief.   Allergies  Allergen Reactions  . Ciprofloxacin Itching     Past Medical History:  Diagnosis Date  . Allergy   . Anxiety   . Asthma   . Depression   . Diabetes mellitus without complication ([facility])   . Diverticulitis   . GERD (gastroesophageal reflux disease)   . HPV in female   . Hyperlipidemia   . Migraines      Relationships  Social connections  . Talks on phone: Not on file  . Gets together: Not on file  . Attends religious service: Not on file   . Active member of club or organization: Not on file  . Attends meetings of clubs or organizations: Not on file  . Relationship status: Not on file       Observations/Objective: Physical Exam  Constitutional: She is oriented to person, place, and time and well-developed, well-nourished, and in no distress. No distress.  HENT:  Head: Normocephalic and atraumatic.  Neck: Normal range of motion. Neck supple.  Full active range of motion of neck  Pulmonary/Chest: Effort normal. No respiratory distress.  Speaking in full sentences, no coughing during video  Musculoskeletal:     Comments: Using extremities appropriately  Neurological: She is alert and oriented to person, place, and time.  Speech clear, face symmetric  Nursing note and vitals reviewed.    Assessment and Plan:    ICD-10-CM   1. Acute recurrent maxillary sinusitis  J01.01     Patient with symptoms of sinusitis.  Symptoms have only been going on for approximately 5 days.  Recommended continued symptomatic and supportive care.  Did go ahead and send in Augmentin and Medrol Dosepak per patient request for patient to fill on Monday if not having any improvement at the week mark.  Recommended Flonase, Mucinex or Zyrtec-D for further management of congestion.Discussed strict return precautions. Patient verbalized understanding and is agreeable with plan.    Follow Up Instructions:     I discussed the assessment and treatment plan with the patient. The  patient was provided an opportunity to ask questions and all were answered. The patient agreed with the plan and demonstrated an understanding of the instructions.   The patient was advised to call back or seek an in-person evaluation if the symptoms worsen or if the condition fails to improve as anticipated.      Janith Lima, PA-C  09/06/2018 10:24 AM         Janith Lima, PA-C 09/06/18 1025

## 2018-09-06 NOTE — Discharge Instructions (Addendum)
Continue over the counter flonase, mucinex, zyrtec D for congestion/sinus pressure  May fill Augmentin/medrol dose pack on Monday if not having any improvement of symptoms with the above over the weekend

## 2018-09-09 ENCOUNTER — Encounter (HOSPITAL_COMMUNITY): Payer: BLUE CROSS/BLUE SHIELD

## 2018-09-09 ENCOUNTER — Ambulatory Visit: Payer: BLUE CROSS/BLUE SHIELD | Admitting: Family Medicine

## 2018-09-12 ENCOUNTER — Ambulatory Visit: Payer: BLUE CROSS/BLUE SHIELD | Admitting: Family Medicine

## 2018-09-12 DIAGNOSIS — Z0289 Encounter for other administrative examinations: Secondary | ICD-10-CM

## 2018-09-12 NOTE — Progress Notes (Deleted)
[name] [address], [place] [address] Phone: [phone] Subjective:    I'm seeing this patient by the request  of:    CC:   [id number]  Heidi Holland is a 43 y.o. female coming in with complaint of ***  Onset-  Location Duration-  Character- Aggravating factors- Reliving factors-  Therapies tried-  Severity-     Past Medical History:  Diagnosis Date  . Allergy   . Anxiety   . Asthma   . Depression   . Diabetes mellitus without complication ([place])   . Diverticulitis   . GERD (gastroesophageal reflux disease)   . HPV in female   . Hyperlipidemia   . Migraines    Past Surgical History:  Procedure Laterality Date  . BOWEL RESECTION    . gasticbypass    . GASTRIC BYPASS    . KNEE ARTHROSCOPY     Social History   Socioeconomic History  . Marital status: Single    Spouse name: Not on file  . Number of children: 0  . Years of education: 35  . Highest education level: Associate degree: occupational, Hotel manager, or vocational program  Occupational History    Employer: [employer]  Social Needs  . Financial resource strain: Not on file  . Food insecurity    Worry: Not on file    Inability: Not on file  . Transportation needs    Medical: Not on file    Non-medical: Not on file  Tobacco Use  . Smoking status: Never Smoker  . Smokeless tobacco: Never Used  Substance and Sexual Activity  . Alcohol use: Yes    Frequency: Never    Comment: Occ  . Drug use: Yes    Comment: THC  . Sexual activity: Never  Lifestyle  . Physical activity    Days per week: Not on file    Minutes per session: Not on file  . Stress: Not on file  Relationships  . Social Herbalist on phone: Not on file    Gets together: Not on file    Attends religious service: Not on file    Active member of club or organization: Not on file    Attends meetings of clubs or organizations: Not on file    Relationship status: Not on file  Other  Topics Concern  . Not on file  Social History Narrative   Right handed.    Allergies  Allergen Reactions  . Ciprofloxacin Itching   Family History  Problem Relation Age of Onset  . Heart disease Mother   . Lung cancer Mother   . Rheum arthritis Mother   . Arthritis Mother   . Cancer Mother   . COPD Mother   . Miscarriages / [place] Mother   . Diabetes Father   . Heart disease Father   . Breast cancer Paternal Aunt     Current Outpatient Medications (Endocrine & Metabolic):  Marland Kitchen  JARDIANCE 10 MG TABS tablet, Take 10 mg by mouth daily. .  metFORMIN (GLUCOPHAGE) 500 MG tablet, Take 2,000 mg by mouth daily with breakfast.  .  methylPREDNISolone (MEDROL DOSEPAK) 4 MG TBPK tablet, Take as directed  Current Outpatient Medications (Cardiovascular):  .  rosuvastatin (CRESTOR) 40 MG tablet, Take 40 mg by mouth daily.  Current Outpatient Medications (Respiratory):  .  albuterol (VENTOLIN HFA) 108 (90 Base) MCG/ACT inhaler, Inhale 2 puffs into the lungs every 6 (six) hours as needed for wheezing or shortness  of breath. .  cetirizine (ZYRTEC) 10 MG tablet,  .  montelukast (SINGULAIR) 10 MG tablet, Take 10 mg by mouth at bedtime.  Current Outpatient Medications (Analgesics):  .  acetaminophen (TYLENOL) 325 MG tablet, Take 650 mg by mouth every 6 (six) hours as needed for mild pain. Eduard Roux (AIMOVIG) 70 MG/ML SOAJ, Inject 70 mg into the skin every 30 (thirty) days. Eduard Roux (AIMOVIG) 70 MG/ML SOAJ, Inject 70 mg into the skin every 30 (thirty) days. .  SUMAtriptan (IMITREX) 50 MG tablet, TAKE 1 TABLET EVERY 2 HOURS AS NEEDED FOR MIGRAINE. MAY REPEAT IN 2 HRS IF HEADACHE PERSISTS/RECURS.  Current Outpatient Medications (Hematological):  Marland Kitchen  IRON-VITAMIN C PO, Take by mouth.  Current Outpatient Medications (Other):  .  amoxicillin-clavulanate (AUGMENTIN) 875-125 MG tablet, Take 1 tablet by mouth every 12 (twelve) hours. .  baclofen (LIORESAL) 10 MG tablet, TAKE 1 TABLET  BY MOUTH THREE TIMES A DAY .  blood glucose meter kit and supplies KIT, Dispense based on patient and insurance preference. Use up to four times daily as directed. (FOR ICD-9 250.00, 250.01). .  clonazePAM (KLONOPIN) 0.5 MG tablet, 0.5 mg as needed.  .  Continuous Blood Gluc Receiver (FREESTYLE LIBRE READER) DEVI, 1 Device by Does not apply route daily as needed (to check blood sugar). .  Continuous Blood Gluc Sensor (FREESTYLE LIBRE SENSOR SYSTEM) MISC, Apply 1 sensor to skin every 14 days. Marland Kitchen  gabapentin (NEURONTIN) 600 MG tablet, 3 (three) times daily.  .  ondansetron (ZOFRAN-ODT) 4 MG disintegrating tablet, TAKE 1 TABLET BY MOUTH EVERY 8 HOURS AS NEEDED FOR NAUSEA AND VOMITING .  pantoprazole (PROTONIX) 40 MG tablet, Take 40 mg by mouth every evening. Marland Kitchen  REXULTI 0.5 MG TABS, Take 1 tablet by mouth daily. .  Vitamin D, Ergocalciferol, (DRISDOL) 1.25 MG (50000 UT) CAPS capsule, TAKE 1 CAPSULE BY MOUTH EVERY 7 DAYS .  vortioxetine HBr (TRINTELLIX) 10 MG TABS tablet, Take 10 mg by mouth daily.    Past medical history, social, surgical and family history all reviewed in electronic medical record.  No pertanent information unless stated regarding to the chief complaint.   Review of Systems:  No headache, visual changes, nausea, vomiting, diarrhea, constipation, dizziness, abdominal pain, skin rash, fevers, chills, night sweats, weight loss, swollen lymph nodes, body aches, joint swelling, muscle aches, chest pain, shortness of breath, mood changes.   Objective  There were no vitals taken for this visit. Systems examined below as of    General: No apparent distress alert and oriented x3 mood and affect normal, dressed appropriately.  HEENT: Pupils equal, extraocular movements intact  Respiratory: Patient's speak in full sentences and does not appear short of breath  Cardiovascular: No lower extremity edema, non tender, no erythema  Skin: Warm dry intact with no signs of infection or rash on  extremities or on axial skeleton.  Abdomen: Soft nontender  Neuro: Cranial nerves II through XII are intact, neurovascularly intact in all extremities with 2+ DTRs and 2+ pulses.  Lymph: No lymphadenopathy of posterior or anterior cervical chain or axillae bilaterally.  Gait normal with good balance and coordination.  MSK:  Non tender with full range of motion and good stability and symmetric strength and tone of shoulders, elbows, wrist, hip, knee and ankles bilaterally.     Impression and Recommendations:     This case required medical decision making of moderate complexity. The above documentation has been reviewed and is accurate and complete Lyndal Pulley,  DO       Note: This dictation was prepared with Dragon dictation along with smaller phrase technology. Any transcriptional errors that result from this process are unintentional.

## 2018-09-17 ENCOUNTER — Encounter: Payer: Self-pay | Admitting: Family Medicine

## 2018-09-17 ENCOUNTER — Other Ambulatory Visit: Payer: Self-pay

## 2018-09-17 ENCOUNTER — Ambulatory Visit (INDEPENDENT_AMBULATORY_CARE_PROVIDER_SITE_OTHER): Payer: BLUE CROSS/BLUE SHIELD | Admitting: Family Medicine

## 2018-09-17 VITALS — BP 124/82 | HR 76 | Temp 98.2°F | Ht 63.0 in | Wt 215.2 lb

## 2018-09-17 DIAGNOSIS — Z9884 Bariatric surgery status: Secondary | ICD-10-CM

## 2018-09-17 DIAGNOSIS — Q828 Other specified congenital malformations of skin: Secondary | ICD-10-CM

## 2018-09-17 DIAGNOSIS — E785 Hyperlipidemia, unspecified: Secondary | ICD-10-CM

## 2018-09-17 DIAGNOSIS — G8929 Other chronic pain: Secondary | ICD-10-CM

## 2018-09-17 DIAGNOSIS — E1169 Type 2 diabetes mellitus with other specified complication: Secondary | ICD-10-CM

## 2018-09-17 DIAGNOSIS — F419 Anxiety disorder, unspecified: Secondary | ICD-10-CM

## 2018-09-17 DIAGNOSIS — D509 Iron deficiency anemia, unspecified: Secondary | ICD-10-CM

## 2018-09-17 DIAGNOSIS — F325 Major depressive disorder, single episode, in full remission: Secondary | ICD-10-CM

## 2018-09-17 DIAGNOSIS — E119 Type 2 diabetes mellitus without complications: Secondary | ICD-10-CM | POA: Diagnosis not present

## 2018-09-17 DIAGNOSIS — M5442 Lumbago with sciatica, left side: Secondary | ICD-10-CM | POA: Diagnosis not present

## 2018-09-17 DIAGNOSIS — Z6838 Body mass index (BMI) 38.0-38.9, adult: Secondary | ICD-10-CM

## 2018-09-17 DIAGNOSIS — G43709 Chronic migraine without aura, not intractable, without status migrainosus: Secondary | ICD-10-CM

## 2018-09-17 LAB — LIPID PANEL
Cholesterol: 121 mg/dL (ref 0–200)
HDL: 38.5 mg/dL — ABNORMAL LOW (ref >39.00)
LDL Cholesterol: 57 mg/dL (ref 0–99)
NonHDL: 82.21
Total CHOL/HDL Ratio: 3
Triglycerides: 126 mg/dL (ref 0.0–149.0)
VLDL: 25.2 mg/dL (ref 0.0–40.0)

## 2018-09-17 LAB — MICROALBUMIN / CREATININE URINE RATIO
Creatinine,U: 69.2 mg/dL
Microalb Creat Ratio: 1.2 mg/g (ref 0.0–30.0)
Microalb, Ur: 0.8 mg/dL (ref 0.0–1.9)

## 2018-09-17 LAB — HEMOGLOBIN A1C: Hgb A1c MFr Bld: 9.5 % — ABNORMAL HIGH (ref 4.6–6.5)

## 2018-09-17 LAB — CBC
HCT: 39.3 % (ref 36.0–46.0)
Hemoglobin: 13 g/dL (ref 12.0–15.0)
MCHC: 33.2 g/dL (ref 30.0–36.0)
MCV: 78.7 fl (ref 78.0–100.0)
Platelets: 286 10*3/uL (ref 150.0–400.0)
RBC: 4.99 Mil/uL (ref 3.87–5.11)
RDW: 17.8 % — ABNORMAL HIGH (ref 11.5–15.5)
WBC: 8.9 10*3/uL (ref 4.0–10.5)

## 2018-09-17 LAB — IBC + FERRITIN
Ferritin: 92.1 ng/mL (ref 10.0–291.0)
Iron: 87 ug/dL (ref 42–145)
Saturation Ratios: 25.5 % (ref 20.0–50.0)
Transferrin: 244 mg/dL (ref 212.0–360.0)

## 2018-09-17 LAB — VITAMIN B12: Vitamin B-12: 388 pg/mL (ref 211–911)

## 2018-09-17 LAB — COMPREHENSIVE METABOLIC PANEL
ALT: 20 U/L (ref 0–35)
AST: 13 U/L (ref 0–37)
Albumin: 4.3 g/dL (ref 3.5–5.2)
Alkaline Phosphatase: 79 U/L (ref 39–117)
BUN: 8 mg/dL (ref 6–23)
CO2: 26 mEq/L (ref 19–32)
Calcium: 9.4 mg/dL (ref 8.4–10.5)
Chloride: 102 mEq/L (ref 96–112)
Creatinine, Ser: 0.52 mg/dL (ref 0.40–1.20)
GFR: 128.7 mL/min (ref >60.00)
Glucose, Bld: 256 mg/dL — ABNORMAL HIGH (ref 70–99)
Potassium: 4.2 mEq/L (ref 3.5–5.1)
Sodium: 137 mEq/L (ref 135–145)
Total Bilirubin: 0.3 mg/dL (ref 0.2–1.2)
Total Protein: 6.7 g/dL (ref 6.0–8.3)

## 2018-09-17 LAB — TSH: TSH: 0.64 u[IU]/mL (ref 0.35–4.50)

## 2018-09-17 MED ORDER — ROSUVASTATIN CALCIUM 40 MG PO TABS: 40.0000 mg | ORAL_TABLET | Freq: Every day | ORAL | 2 refills | Status: DC

## 2018-09-17 MED ORDER — ALBUTEROL SULFATE HFA 108 (90 BASE) MCG/ACT IN AERS: 2.0000 | INHALATION_SPRAY | Freq: Four times a day (QID) | RESPIRATORY_TRACT | 2 refills | Status: DC | PRN

## 2018-09-17 MED ORDER — HYDROXYZINE HCL 50 MG PO TABS: 25.0000 mg | ORAL_TABLET | Freq: Every evening | ORAL | 1 refills | Status: DC | PRN

## 2018-09-17 MED ORDER — SUMATRIPTAN SUCCINATE 50 MG PO TABS: ORAL_TABLET | ORAL | 1 refills | Status: DC

## 2018-09-17 MED ORDER — TIZANIDINE HCL 4 MG PO CAPS: 4.0000 mg | ORAL_CAPSULE | Freq: Three times a day (TID) | ORAL | 0 refills | Status: DC

## 2018-09-17 MED ORDER — CETIRIZINE HCL 10 MG PO TABS: 10.0000 mg | ORAL_TABLET | Freq: Every day | ORAL | 1 refills | Status: DC

## 2018-09-17 MED ORDER — ONDANSETRON 4 MG PO TBDP: ORAL_TABLET | ORAL | 1 refills | Status: DC

## 2018-09-17 MED ORDER — TRAMADOL HCL 50 MG PO TABS: 50.0000 mg | ORAL_TABLET | Freq: Three times a day (TID) | ORAL | 0 refills | Status: DC | PRN

## 2018-09-17 MED ORDER — PANTOPRAZOLE SODIUM 40 MG PO TBEC: 40.0000 mg | DELAYED_RELEASE_TABLET | Freq: Every evening | ORAL | 2 refills | Status: DC

## 2018-09-17 NOTE — Assessment & Plan Note (Signed)
Continue crestor 40mg  daily. Check lipid panel.

## 2018-09-17 NOTE — Patient Instructions (Signed)
It was very nice to see you today!  I will place a referral for you to see the neurosurgeon and PT. Please take the tramadol as needed.  I will send in a prescription for Zanaflex for your muscle spasms.  Also send in hydroxyzine for your anxiety.  I will also place a referral for you to see the dermatologist.  We will check blood work today and a urine sample.   Take care, Dr Jerline Pain  Please try these tips to maintain a healthy lifestyle:   Eat at least 3 REAL meals and 1-2 snacks per day.  Aim for no more than 5 hours between eating.  If you eat breakfast, please do so within one hour of getting up.    Obtain twice as many fruits/vegetables as protein or carbohydrate foods for both lunch and dinner. (Half of each meal should be fruits/vegetables, one quarter protein, and one quarter starchy carbs)   Cut down on sweet beverages. This includes juice, soda, and sweet tea.    Exercise at least 150 minutes every week.

## 2018-09-17 NOTE — Progress Notes (Signed)
Chief Complaint:  Heidi Holland is a 43 y.o. female who presents today with a chief complaint of back pain.   Assessment/Plan:  Depression, major, single episode, complete remission (HCC) Stable.  Continue management per psychiatry.  Continue Trintellix 10 mg daily and Rexulti 0.5 mg daily.  Anxiety Slightly worsened.  Will add on hydroxyzine 25 to 50 mg as needed for anxiety.  She will continue taking her Trintellix 10 mg daily and Klonopin 0.5 mg daily as needed per psychiatry.  S/P gastric bypass Check CBC, C met, and iron panel.  Dyslipidemia associated with type 2 diabetes mellitus ([place]) Continue crestor 31m daily. Check lipid panel.   T2DM (type 2 diabetes mellitus) ([place] Patient has self discontinued Jardiance due to intolerable side effects.  She will continue metformin 2000 g daily.  Check A1c and urine microalbumin today.  Iron deficiency anemia Check CBC and iron panel.  Low back pain No red flags.  Will place referral to neurosurgery.  Will give short supply of tramadol for severe pain.  Database reviewed without red flags.  Also place referral to physical therapy.  Skin Tags Referral placed to dermatology.  Body mass index is 38.12 kg/m. / Morbid Obesity BMI Metric Follow Up - 09/17/18 0837      BMI Metric Follow Up-Please document annually   BMI Metric Follow Up  Education provided         Subjective:  HPI:  Back Pain  She has been following with sports medicine for this.  Symptoms been persistent for several years.  She has seen several specialists in the past.  [place] that seem to work well.  She is recently been referred to pain management however is not heard anything back yet.  She has some radicular pain radiating down her left leg.  Overall her symptoms are manageable however she does have some days with severe pain.  Toradol typically helps however she notes that she cannot get this every time  she has a flareup.  She would also like to have a referral to physical therapy.  Patient also request referral to dermatology for removal of skin tags.  Her stable, chronic medical conditions are outlined below:  # T2DM - On metformin 20019mdaily, jardiance 1034maily -Does not have a functioning glucometer at home-does not check home blood sugar. - ROS: No reported polyuria polydipsia  # Iron Deficiency - On oral iron supplementation but she is not sure if it is working  # GERD / Nausea / s/p gastric bypass - On protonix 9m21mily and tolerating well - Takes zofran 4mg 12mneeded  % Anxiety / Depression / Insomnia - Follows with psychiatry - On trintellix 10mg 73my, klonopin 0.5mg as37meded, Rexulti 0.5mg dai4m- Takes ambien 5mg nigh35m as needed for sleep - ROS: No reported SI or HI. - Symptoms have worsened recently and she has had some more difficulty sleeping  ROS: Per HPI  PMH: She reports that she has never smoked. She has never used smokeless tobacco. She reports current alcohol use. She reports current drug use.      Objective:  Physical Exam: BP 124/82 (BP Location: Left Arm, Patient Position: Sitting, Cuff Size: Normal)    Pulse 76    Temp 98.2 F (36.8 C) (Oral)    Ht _0  (1.6 m)    Wt 215 lb 3.2 oz (97.6 kg)    SpO2 95%    BMI 38.12  kg/m   Gen: NAD, resting comfortably CV: Regular rate and rhythm with no murmurs appreciated Pulm: Normal work of breathing, clear to auscultation bilaterally with no crackles, wheezes, or rhonchi MSK: No edema, cyanosis, or clubbing noted. Back with no deformities. LE with full strength.       Algis Greenhouse. Jerline Pain, MD 09/17/2018 8:40 AM

## 2018-09-17 NOTE — Assessment & Plan Note (Signed)
No red flags.  Will place referral to neurosurgery.  Will give short supply of tramadol for severe pain.  Database reviewed without red flags.  Also place referral to physical therapy.

## 2018-09-17 NOTE — Assessment & Plan Note (Signed)
Stable.  Continue management per psychiatry.  Continue Trintellix 10 mg daily and Rexulti 0.5 mg daily.

## 2018-09-17 NOTE — Assessment & Plan Note (Signed)
Check CBC, C met, and iron panel. 

## 2018-09-17 NOTE — Assessment & Plan Note (Signed)
Patient has self discontinued Jardiance due to intolerable side effects.  She will continue metformin 2000 g daily.  Check A1c and urine microalbumin today.

## 2018-09-17 NOTE — Assessment & Plan Note (Signed)
Slightly worsened.  Will add on hydroxyzine 25 to 50 mg as needed for anxiety.  She will continue taking her Trintellix 10 mg daily and Klonopin 0.5 mg daily as needed per psychiatry.

## 2018-09-17 NOTE — Assessment & Plan Note (Signed)
Check CBC and iron panel. 

## 2018-09-18 NOTE — Progress Notes (Signed)
Please inform patient of the following:  Her A1c is 9.5 but all of her other labs including her iron level are within normal ranges. Recommend starting [place] 100mg  daily (she just discontinued jardiance 10mg  daily). Please send in for patient and we can check again in 3 months.  Algis Greenhouse. Jerline Pain, MD 09/18/2018 7:47 AM

## 2018-09-22 ENCOUNTER — Other Ambulatory Visit: Payer: Self-pay

## 2018-09-22 ENCOUNTER — Other Ambulatory Visit: Payer: Self-pay | Admitting: Neurology

## 2018-09-22 ENCOUNTER — Encounter (HOSPITAL_COMMUNITY): Payer: Self-pay | Admitting: Emergency Medicine

## 2018-09-22 ENCOUNTER — Ambulatory Visit (HOSPITAL_COMMUNITY)
Admission: EM | Admit: 2018-09-22 | Discharge: 2018-09-22 | Disposition: A | Discharge status: Home / Self Care | Payer: BLUE CROSS/BLUE SHIELD | Attending: Emergency Medicine | Admitting: Emergency Medicine

## 2018-09-22 DIAGNOSIS — G43709 Chronic migraine without aura, not intractable, without status migrainosus: Secondary | ICD-10-CM

## 2018-09-22 DIAGNOSIS — M25562 Pain in left knee: Secondary | ICD-10-CM

## 2018-09-22 DIAGNOSIS — G8929 Other chronic pain: Secondary | ICD-10-CM

## 2018-09-22 DIAGNOSIS — M5442 Lumbago with sciatica, left side: Secondary | ICD-10-CM | POA: Diagnosis not present

## 2018-09-22 MED ORDER — SITAGLIPTIN PHOSPHATE 100 MG PO TABS: 100.0000 mg | ORAL_TABLET | Freq: Every day | ORAL | 3 refills | Status: DC

## 2018-09-22 MED ORDER — KETOROLAC TROMETHAMINE 30 MG/ML IJ SOLN
30.0000 mg | Freq: Once | INTRAMUSCULAR | Status: AC
  Administered 2018-09-22: 30 mg via INTRAMUSCULAR

## 2018-09-22 MED ORDER — KETOROLAC TROMETHAMINE 30 MG/ML IJ SOLN
INTRAMUSCULAR | Status: AC
  Filled 2018-09-22: qty 1

## 2018-09-22 NOTE — ED Triage Notes (Addendum)
patient has chronic back issues.    Night before last, patient went to the bathroom during the night and while standing from seated position felt a shooting pain in left thigh and through left knee.  Now back pain is worse, groin pain and pain into left leg/knee  Patient is a gastric bypass patient.  Tylenol does not help.  Has been "cheating" and taking ibuprofen.  Now has gastritis and distention

## 2018-09-22 NOTE — ED Provider Notes (Signed)
[facility]    CSN: [id number] Arrival date & time: 09/22/18  1637     History   Chief Complaint Chief Complaint  Patient presents with  . Appointment    4:50 pm  . Fall    HPI Heidi Holland is a 43 y.o. female.   Patient presents with left knee pain x 2 days which is worse with weightbearing and has improved with use of ibuprofen.  Patient states she knows she should not be taking ibuprofen due to her gastric bypass but that she has tried Tylenol without relief and does not want to take any narcotics.  She also reports chronic back pain which radiates down her left leg; she states she has an upcoming appointment with a "spine surgeon".  Her knee pain began when she was sitting on the toilet and attempted to rise, at which point she had acute pain in her left knee; she reports the pain has been ongoing since then.  She denies other injury or falls.  She reports she has a history of a torn ligament in her left knee which occurred approximately 10 years ago.  She denies saddle anesthesia, incontinence of bowel or bladder, weakness in her LE.  The history is provided by the patient.    Past Medical History:  Diagnosis Date  . Allergy   . Anxiety   . Asthma   . Depression   . Diabetes mellitus without complication ([place])   . Diverticulitis   . GERD (gastroesophageal reflux disease)   . HPV in female   . Hyperlipidemia   . Migraines     Patient Active Problem List   Diagnosis Date Noted  . T2DM (type 2 diabetes mellitus) ([place]) 07/31/2018  . Dyslipidemia associated with type 2 diabetes mellitus ([place]) 07/31/2018  . Allergic rhinitis 07/31/2018  . Mild intermittent asthma, uncomplicated 69/67/8938  . GERD (gastroesophageal reflux disease) 07/31/2018  . S/P gastric bypass 07/31/2018  . Migraine 07/31/2018  . Anxiety 07/31/2018  . Depression, major, single episode, complete remission ([place]) 07/31/2018  . Insomnia 07/31/2018  . Umbilical hernia 11/28/5100  . Gluteal  tendinitis of left buttock 07/17/2018  . Nonallopathic lesion of cervical region 06/19/2018  . Nonallopathic lesion of thoracic region 06/19/2018  . Nonallopathic lesion of sacral region 06/19/2018  . Nonallopathic lesion of rib cage 06/19/2018  . Nonallopathic lesion of lumbosacral region 06/19/2018  . Iron deficiency anemia 06/09/2018  . Cervicogenic headache 05/12/2018  . Low back pain 05/12/2018  . Polyarthralgia 05/12/2018    Past Surgical History:  Procedure Laterality Date  . BOWEL RESECTION    . gasticbypass    . GASTRIC BYPASS    . KNEE ARTHROSCOPY      OB History   No obstetric history on file.      Home Medications    Prior to Admission medications   Medication Sig Start Date End Date Taking? Authorizing Provider  acetaminophen (TYLENOL) 325 MG tablet Take 650 mg by mouth every 6 (six) hours as needed for mild pain.    [provider]  albuterol (VENTOLIN HFA) 108 (90 Base) MCG/ACT inhaler Inhale 2 puffs into the lungs every 6 (six) hours as needed for wheezing or shortness of breath. 09/17/18   Vivi Barrack, MD  blood glucose meter kit and supplies KIT Dispense based on patient and insurance preference. Use up to four times daily as directed. (FOR ICD-9 250.00, 250.01). 07/31/18   Vivi Barrack, MD  cetirizine (ZYRTEC) 10 MG tablet Take  1 tablet (10 mg total) by mouth daily. 09/17/18   Vivi Barrack, MD  clonazePAM (KLONOPIN) 0.5 MG tablet 0.5 mg as needed.  04/24/18   [provider]  Continuous Blood Gluc Receiver (FREESTYLE LIBRE READER) DEVI 1 Device by Does not apply route daily as needed (to check blood sugar). 08/14/18   Vivi Barrack, MD  Continuous Blood Gluc Sensor (FREESTYLE LIBRE SENSOR SYSTEM) MISC Apply 1 sensor to skin every 14 days. 08/14/18   Vivi Barrack, MD  Erenumab-aooe (AIMOVIG) 70 MG/ML SOAJ Inject 70 mg into the skin every 30 (thirty) days. 05/02/18   Tomi Likens, Adam R, DO  Erenumab-aooe (AIMOVIG) 70 MG/ML SOAJ Inject 70 mg into  the skin every 30 (thirty) days. 05/02/18   Tomi Likens, Adam R, DO  gabapentin (NEURONTIN) 600 MG tablet 3 (three) times daily.  04/30/18   [provider]  hydrOXYzine (ATARAX/VISTARIL) 50 MG tablet Take 0.5-2 tablets (25-100 mg total) by mouth at bedtime as needed for anxiety. 09/17/18   Vivi Barrack, MD  IRON-VITAMIN C PO Take by mouth.    [provider]  metFORMIN (GLUCOPHAGE) 500 MG tablet Take 2,000 mg by mouth daily with breakfast.     [provider]  montelukast (SINGULAIR) 10 MG tablet Take 10 mg by mouth at bedtime.    [provider]  ondansetron (ZOFRAN-ODT) 4 MG disintegrating tablet TAKE 1 TABLET BY MOUTH EVERY 8 HOURS AS NEEDED FOR NAUSEA AND VOMITING 09/17/18   Vivi Barrack, MD  pantoprazole (PROTONIX) 40 MG tablet Take 1 tablet (40 mg total) by mouth every evening. 09/17/18 12/16/18  Vivi Barrack, MD  REXULTI 0.5 MG TABS Take 1 tablet by mouth daily. 04/17/18   [provider]  rosuvastatin (CRESTOR) 40 MG tablet Take 1 tablet (40 mg total) by mouth daily. 09/17/18 12/16/18  Vivi Barrack, MD  sitaGLIPtin (JANUVIA) 100 MG tablet Take 1 tablet (100 mg total) by mouth daily. 09/22/18   Vivi Barrack, MD  SUMAtriptan (IMITREX) 50 MG tablet TAKE 1 TABLET EVERY 2 HOURS AS NEEDED FOR MIGRAINE. MAY REPEAT IN 2 HRS IF HEADACHE PERSISTS/RECURS. 09/17/18   Vivi Barrack, MD  tiZANidine (ZANAFLEX) 4 MG capsule Take 1 capsule (4 mg total) by mouth 3 (three) times daily. 09/17/18   Vivi Barrack, MD  TOSYMRA 10 MG/ACT SOLN USE 1 SPRAY IN THE NOSE EVERY HOUR AS NEEDED (MAX 3 SPRAYS PER 24 HOURS) 09/22/18   Jaffe, Adam R, DO  traMADol (ULTRAM) 50 MG tablet Take 1 tablet (50 mg total) by mouth every 8 (eight) hours as needed for up to 5 days. 09/17/18 09/22/18  Vivi Barrack, MD  Vitamin D, Ergocalciferol, (DRISDOL) 1.25 MG (50000 UT) CAPS capsule TAKE 1 CAPSULE BY MOUTH EVERY 7 DAYS 07/21/18   Lyndal Pulley, DO  vortioxetine HBr (TRINTELLIX) 10 MG TABS tablet  Take 10 mg by mouth daily.    [provider]    Family History Family History  Problem Relation Age of Onset  . Heart disease Mother   . Lung cancer Mother   . Rheum arthritis Mother   . Arthritis Mother   . Cancer Mother   . COPD Mother   . Miscarriages / [place] Mother   . Diabetes Father   . Heart disease Father   . Breast cancer Paternal Aunt     Social History Social History   Tobacco Use  . Smoking status: Never Smoker  . Smokeless tobacco: Never Used  Substance Use Topics  . Alcohol use: Yes    Frequency: Never    Comment: Occ  . Drug use: Yes    Comment: THC     Allergies   Ciprofloxacin   Review of Systems Review of Systems  Constitutional: Negative for chills and fever.  HENT: Negative for ear pain and sore throat.   Eyes: Negative for pain and visual disturbance.  Respiratory: Negative for cough and shortness of breath.   Cardiovascular: Negative for chest pain and palpitations.  Gastrointestinal: Negative for abdominal pain and vomiting.  Genitourinary: Negative for dysuria and hematuria.  Musculoskeletal: Positive for arthralgias and back pain.  Skin: Negative for color change, rash and wound.  Neurological: Negative for seizures, syncope, weakness and numbness.  All other systems reviewed and are negative.    Physical Exam Triage Vital Signs ED Triage Vitals  Enc Vitals Group     BP 09/22/18 1650 139/86     Pulse Rate 09/22/18 1650 80     Resp 09/22/18 1650 18     Temp 09/22/18 1650 98.1 F (36.7 C)     Temp Source 09/22/18 1650 Temporal     SpO2 09/22/18 1650 98 %     Weight --      Height --      Head Circumference --      Peak Flow --      Pain Score 09/22/18 1655 8     Pain Loc --      Pain Edu? --      Excl. in [place]? --    No data found.  Updated Vital Signs BP 139/86 (BP Location: Left Arm)   Pulse 80   Temp 98.1 F (36.7 C) (Temporal)   Resp 18   LMP 08/30/2018   SpO2 98%   Visual Acuity Right Eye  Distance:   Left Eye Distance:   Bilateral Distance:    Right Eye Near:   Left Eye Near:    Bilateral Near:     Physical Exam Vitals signs and nursing note reviewed.  Constitutional:      General: She is not in acute distress.    Appearance: She is well-developed.  HENT:     Head: Normocephalic and atraumatic.  Eyes:     Conjunctiva/sclera: Conjunctivae normal.  Neck:     Musculoskeletal: Neck supple.  Cardiovascular:     Rate and Rhythm: Normal rate and regular rhythm.     Heart sounds: No murmur.  Pulmonary:     Effort: Pulmonary effort is normal. No respiratory distress.     Breath sounds: Normal breath sounds.  Abdominal:     Palpations: Abdomen is soft.     Tenderness: There is no abdominal tenderness.  Musculoskeletal:        General: Tenderness present. No swelling or deformity.     Comments: Left lateral knee tender to palpation.   Skin:    General: Skin is warm and dry.     Findings: No bruising, erythema or lesion.  Neurological:     Mental Status: She is alert.     Sensory: No sensory deficit.     Motor: No weakness.     Coordination: Coordination normal.     Gait: Gait normal.     Deep Tendon Reflexes: Reflexes normal.  Psychiatric:        Mood and Affect: Mood normal.        Behavior: Behavior normal.      UC Treatments / Results  Labs (  all labs ordered are listed, but only abnormal results are displayed) Labs Reviewed - No data to display  EKG   Radiology No results found.  Procedures Procedures (including critical care time)  Medications Ordered in UC Medications  ketorolac (TORADOL) 30 MG/ML injection 30 mg (30 mg Intramuscular Given 09/22/18 1719)  ketorolac (TORADOL) 30 MG/ML injection (has no administration in time range)    Initial Impression / Assessment and Plan / UC Course  I have reviewed the triage vital signs and the nursing notes.  Pertinent labs & imaging results that were available during my care of the patient were  reviewed by me and considered in my medical decision making (see chart for details).   Left knee pain, chronic back pain.  Treated today with injection of Toradol.  Instructed patient to follow-up with her orthopedist or with the orthopedic recommended.  Instructed patient to return here or go to the emergency department if she develops numbness or weakness in her legs, incontinence of her bowel or bladder, or other concerning symptoms.  Instructed patient to stop taking ibuprofen.     Final Clinical Impressions(s) / UC Diagnoses   Final diagnoses:  Acute pain of left knee  Chronic bilateral low back pain with left-sided sciatica     Discharge Instructions     You were given an injection of Toradol today.    Follow-up with your orthopedic surgeon or with the orthopedic listed below.    Return here or go to the emergency department if your pain worsens or you develop numbness or weakness in your legs, you lose control of your bowels or bladder, or develop other concerning symptoms.      ED Prescriptions    None     Controlled Substance Prescriptions [place] Controlled Substance Registry consulted? Yes, I have consulted the [place] Controlled Substances Registry for this patient, and feel the risk/benefit ratio today is favorable for proceeding with this prescription for a controlled substance.   Sharion Balloon, NP 09/22/18 [phone]

## 2018-09-22 NOTE — Discharge Instructions (Addendum)
You were given an injection of Toradol today.    Follow-up with your orthopedic surgeon or with the orthopedic listed below.    Return here or go to the emergency department if your pain worsens or you develop numbness or weakness in your legs, you lose control of your bowels or bladder, or develop other concerning symptoms.

## 2018-09-23 DIAGNOSIS — M25552 Pain in left hip: Secondary | ICD-10-CM | POA: Diagnosis not present

## 2018-09-23 DIAGNOSIS — M25562 Pain in left knee: Secondary | ICD-10-CM | POA: Diagnosis not present

## 2018-09-23 DIAGNOSIS — M2242 Chondromalacia patellae, left knee: Secondary | ICD-10-CM | POA: Diagnosis not present

## 2018-09-23 DIAGNOSIS — M1612 Unilateral primary osteoarthritis, left hip: Secondary | ICD-10-CM | POA: Diagnosis not present

## 2018-09-28 ENCOUNTER — Other Ambulatory Visit: Payer: Self-pay | Admitting: Family Medicine

## 2018-09-29 ENCOUNTER — Other Ambulatory Visit: Payer: Self-pay | Admitting: Family Medicine

## 2018-10-01 DIAGNOSIS — M25552 Pain in left hip: Secondary | ICD-10-CM | POA: Diagnosis not present

## 2018-10-01 NOTE — Telephone Encounter (Signed)
Please advise 

## 2018-10-06 ENCOUNTER — Telehealth: Payer: Self-pay | Admitting: Family Medicine

## 2018-10-06 NOTE — Telephone Encounter (Signed)
Medication Refill - Medication: gabapentin (NEURONTIN) 600 MG tablet   Has the patient contacted their pharmacy? No. (Agent: If no, request that the patient contact the pharmacy for the refill.) (Agent: If yes, when and what did the pharmacy advise?)  Preferred Pharmacy (with phone number or street name):  [id number] [address], [place] [phone] (Phone) [phone] (Fax)     Agent: Please be advised that RX refills may take up to 3 business days. We ask that you follow-up with your pharmacy.

## 2018-10-07 ENCOUNTER — Other Ambulatory Visit: Payer: Self-pay | Admitting: Family Medicine

## 2018-10-07 ENCOUNTER — Other Ambulatory Visit: Payer: Self-pay

## 2018-10-07 MED ORDER — GABAPENTIN 600 MG PO TABS: 600.0000 mg | ORAL_TABLET | Freq: Three times a day (TID) | ORAL | 0 refills | Status: DC

## 2018-10-07 NOTE — Telephone Encounter (Signed)
Rx sent 

## 2018-10-07 NOTE — Telephone Encounter (Signed)
Rx request 

## 2018-10-07 NOTE — Telephone Encounter (Signed)
See note

## 2018-10-07 NOTE — Telephone Encounter (Signed)
Ok with me. Please place any necessary orders. 

## 2018-10-08 DIAGNOSIS — M25562 Pain in left knee: Secondary | ICD-10-CM | POA: Diagnosis not present

## 2018-10-08 DIAGNOSIS — M25552 Pain in left hip: Secondary | ICD-10-CM | POA: Diagnosis not present

## 2018-10-09 ENCOUNTER — Other Ambulatory Visit: Payer: Self-pay | Admitting: Family Medicine

## 2018-10-09 DIAGNOSIS — M87852 Other osteonecrosis, left femur: Secondary | ICD-10-CM | POA: Diagnosis not present

## 2018-10-09 DIAGNOSIS — M25552 Pain in left hip: Secondary | ICD-10-CM | POA: Diagnosis not present

## 2018-10-09 DIAGNOSIS — M7062 Trochanteric bursitis, left hip: Secondary | ICD-10-CM | POA: Diagnosis not present

## 2018-10-14 DIAGNOSIS — M5126 Other intervertebral disc displacement, lumbar region: Secondary | ICD-10-CM | POA: Diagnosis not present

## 2018-10-14 DIAGNOSIS — M545 Low back pain: Secondary | ICD-10-CM | POA: Diagnosis not present

## 2018-10-15 DIAGNOSIS — M25552 Pain in left hip: Secondary | ICD-10-CM | POA: Diagnosis not present

## 2018-10-15 DIAGNOSIS — M25562 Pain in left knee: Secondary | ICD-10-CM | POA: Diagnosis not present

## 2018-10-17 DIAGNOSIS — M25562 Pain in left knee: Secondary | ICD-10-CM | POA: Diagnosis not present

## 2018-10-17 DIAGNOSIS — M25552 Pain in left hip: Secondary | ICD-10-CM | POA: Diagnosis not present

## 2018-10-22 DIAGNOSIS — M25552 Pain in left hip: Secondary | ICD-10-CM | POA: Diagnosis not present

## 2018-10-22 DIAGNOSIS — M25562 Pain in left knee: Secondary | ICD-10-CM | POA: Diagnosis not present

## 2018-10-23 DIAGNOSIS — M545 Low back pain: Secondary | ICD-10-CM | POA: Diagnosis not present

## 2018-10-30 ENCOUNTER — Other Ambulatory Visit: Payer: Self-pay

## 2018-10-30 DIAGNOSIS — M5416 Radiculopathy, lumbar region: Secondary | ICD-10-CM | POA: Diagnosis not present

## 2018-10-30 DIAGNOSIS — M545 Low back pain: Secondary | ICD-10-CM | POA: Diagnosis not present

## 2018-10-30 DIAGNOSIS — Z20828 Contact with and (suspected) exposure to other viral communicable diseases: Secondary | ICD-10-CM | POA: Diagnosis not present

## 2018-10-30 MED ORDER — SUMATRIPTAN SUCCINATE 50 MG PO TABS: ORAL_TABLET | ORAL | 1 refills | Status: DC

## 2018-10-31 ENCOUNTER — Other Ambulatory Visit: Payer: Self-pay | Admitting: Family Medicine

## 2018-11-03 ENCOUNTER — Other Ambulatory Visit: Payer: Self-pay

## 2018-11-03 MED ORDER — TIZANIDINE HCL 4 MG PO CAPS: 4.0000 mg | ORAL_CAPSULE | Freq: Three times a day (TID) | ORAL | 0 refills | Status: DC

## 2018-11-04 DIAGNOSIS — M25552 Pain in left hip: Secondary | ICD-10-CM | POA: Diagnosis not present

## 2018-11-05 DIAGNOSIS — F4310 Post-traumatic stress disorder, unspecified: Secondary | ICD-10-CM | POA: Diagnosis not present

## 2018-11-17 DIAGNOSIS — R200 Anesthesia of skin: Secondary | ICD-10-CM | POA: Diagnosis not present

## 2018-11-17 DIAGNOSIS — M79605 Pain in left leg: Secondary | ICD-10-CM | POA: Diagnosis not present

## 2018-11-17 DIAGNOSIS — M5126 Other intervertebral disc displacement, lumbar region: Secondary | ICD-10-CM | POA: Diagnosis not present

## 2018-11-21 ENCOUNTER — Ambulatory Visit (INDEPENDENT_AMBULATORY_CARE_PROVIDER_SITE_OTHER): Payer: BLUE CROSS/BLUE SHIELD | Admitting: Family Medicine

## 2018-11-21 DIAGNOSIS — G4730 Sleep apnea, unspecified: Secondary | ICD-10-CM

## 2018-11-21 DIAGNOSIS — M5442 Lumbago with sciatica, left side: Secondary | ICD-10-CM

## 2018-11-21 DIAGNOSIS — G8929 Other chronic pain: Secondary | ICD-10-CM

## 2018-11-21 DIAGNOSIS — G4700 Insomnia, unspecified: Secondary | ICD-10-CM

## 2018-11-21 MED ORDER — BELSOMRA 10 MG PO TABS: 10.0000 mg | ORAL_TABLET | Freq: Every evening | ORAL | 5 refills | Status: DC | PRN

## 2018-11-21 NOTE — Assessment & Plan Note (Signed)
Chronic problem.  Stable.  Continue management per orthopedics.  Would avoid further serotonergics given her use of tramadol.

## 2018-11-21 NOTE — Assessment & Plan Note (Signed)
Chronic problem.  Worsening.  She has tried several medications including Seroquel, hydroxyzine, Lunesta, and Ambien.  Will try Belsomra.  Discussed potential side effects.

## 2018-11-21 NOTE — Progress Notes (Signed)
    Chief Complaint:  Heidi Holland is a 43 y.o. female who presents today for a virtual office visit with a chief complaint of insomnia.   Assessment/Plan:  Insomnia Chronic problem.  Worsening.  She has tried several medications including Seroquel, hydroxyzine, Lunesta, and Ambien.  Will try Belsomra.  Discussed potential side effects.  Low back pain Chronic problem.  Stable.  Continue management per orthopedics.  Would avoid further serotonergics given her use of tramadol.  Sleep disordered breathing Place referral to sleep studies    Subjective:  HPI:   # Insomnia Has been managed by psychiatry in the past.  She has been on Ambien in the past.  This was stopped due to concern about interfering with her other medications.  She is also tried other medications including Seroquel, hydroxyzine, and Lunesta.  She is additionally concerned about possible sleep apnea.  She was tested for this in the past however is been several years.  Feels very unrested throughout the day.  Her insomnia seems to be worsening.  She thinks it is worsened due to stress and pain she is experiencing from her lower back and hip pain.  No other obvious alleviating or aggravating factors.  # Chronic Back and Hip Pain Currently follows with orthopedics for both of these issues.  She recently had MRI done.  She will be getting a hip injection done soon.  She is hopeful to swell up with the pain.  She is currently taking tramadol for her pain.  ROS: Per HPI  PMH: She reports that she has never smoked. She has never used smokeless tobacco. She reports current alcohol use. She reports current drug use.      Objective/Observations  Physical Exam: Gen: NAD, resting comfortably Pulm: Normal work of breathing Psych: Normal affect and thought content  Virtual Visit via Video   I connected with Heidi Holland on 11/21/18 at 10:40 AM EDT by a video enabled telemedicine application and verified that I am speaking with  the correct person using two identifiers. I discussed the limitations of evaluation and management by telemedicine and the availability of in person appointments. The patient expressed understanding and agreed to proceed.   Patient location: Home Provider location: [place] participating in the virtual visit: Myself and Patient     Algis Greenhouse. Jerline Pain, MD 11/21/2018 10:59 AM

## 2018-11-25 ENCOUNTER — Other Ambulatory Visit: Payer: Self-pay | Admitting: Family Medicine

## 2018-11-26 DIAGNOSIS — M25552 Pain in left hip: Secondary | ICD-10-CM | POA: Diagnosis not present

## 2018-12-08 ENCOUNTER — Other Ambulatory Visit: Payer: Self-pay | Admitting: Family Medicine

## 2018-12-10 DIAGNOSIS — M25552 Pain in left hip: Secondary | ICD-10-CM | POA: Diagnosis not present

## 2018-12-10 DIAGNOSIS — M1612 Unilateral primary osteoarthritis, left hip: Secondary | ICD-10-CM | POA: Diagnosis not present

## 2018-12-10 DIAGNOSIS — M94252 Chondromalacia, left hip: Secondary | ICD-10-CM | POA: Diagnosis not present

## 2018-12-16 DIAGNOSIS — M25552 Pain in left hip: Secondary | ICD-10-CM | POA: Diagnosis not present

## 2018-12-16 DIAGNOSIS — M1612 Unilateral primary osteoarthritis, left hip: Secondary | ICD-10-CM | POA: Diagnosis not present

## 2018-12-19 DIAGNOSIS — M79605 Pain in left leg: Secondary | ICD-10-CM | POA: Diagnosis not present

## 2018-12-22 ENCOUNTER — Telehealth: Payer: Self-pay | Admitting: Family Medicine

## 2018-12-22 NOTE — Telephone Encounter (Signed)
To soon to fill,patient should contact pharmacy

## 2018-12-22 NOTE — Telephone Encounter (Signed)
See below

## 2018-12-22 NOTE — Telephone Encounter (Signed)
Medication Refill - Medication: tiZANidine (ZANAFLEX) 4 MG capsule    Has the patient contacted their pharmacy? Yes.   Pt states she is out of the medication. Please advise.  (Agent: If no, request that the patient contact the pharmacy for the refill.) (Agent: If yes, when and what did the pharmacy advise?)  Preferred Pharmacy (with phone number or street name):  [id number] [address], [place]  [place] [address]  Phone: [phone] Fax: [phone]  Not a 24 hour pharmacy; exact hours not known.     Agent: Please be advised that RX refills may take up to 3 business days. We ask that you follow-up with your pharmacy.

## 2018-12-25 ENCOUNTER — Other Ambulatory Visit: Payer: Self-pay

## 2018-12-25 ENCOUNTER — Other Ambulatory Visit: Payer: Self-pay | Admitting: Family Medicine

## 2018-12-25 ENCOUNTER — Ambulatory Visit: Payer: BLUE CROSS/BLUE SHIELD | Admitting: Family Medicine

## 2018-12-25 MED ORDER — TIZANIDINE HCL 4 MG PO CAPS: 4.0000 mg | ORAL_CAPSULE | Freq: Three times a day (TID) | ORAL | 0 refills | Status: DC

## 2018-12-25 NOTE — Telephone Encounter (Signed)
Rx sent to pharmacy   

## 2018-12-25 NOTE — Telephone Encounter (Signed)
Patient calling back and asking if they can resend it to the pharmacy said they haven't received it yet.

## 2018-12-30 ENCOUNTER — Ambulatory Visit: Payer: Self-pay

## 2018-12-30 NOTE — Telephone Encounter (Signed)
Telephone call from Patient with a complaint of having an reaction from medication of Januvia 100mg .  .  Patient reports diarrhea, chills nausea night sweats  And vomiting.  Patient reports that she is still taking metformin.  Patient would like a return phone call from provider as to what she can take.  {Patient states that she is due to have surgery in December would like this cleared up be for then. Pleas call [phone]

## 2018-12-30 NOTE — Telephone Encounter (Signed)
See note

## 2018-12-31 ENCOUNTER — Other Ambulatory Visit: Payer: Self-pay

## 2018-12-31 DIAGNOSIS — G43709 Chronic migraine without aura, not intractable, without status migrainosus: Secondary | ICD-10-CM

## 2018-12-31 MED ORDER — ONDANSETRON 4 MG PO TBDP: ORAL_TABLET | ORAL | 1 refills | Status: DC

## 2018-12-31 NOTE — Telephone Encounter (Signed)
Patient stated after 4 weeks of taking Januvia she began to have nausea and sweats,4 days ago vomiting started,and yesterday she started having diarrhea.She has stopped taking januvia,night sweats has improved slightly.She is unsure if its from taking Januvia,patient stated she had Gastric bypass surgery in 2016. I refilled Zofran for patient,informed to try a bland diet, if symptoms worsen go to urgent care.Please advise

## 2019-01-01 NOTE — Telephone Encounter (Signed)
Would be unusual for januvia to start causing side effects weeks after starting 0 sound slike she may have had a mild case of gastroenteritis. [place] with given directions.  Recommend restarting diabetes medications after her symptoms have resolved for a couple of days.

## 2019-01-01 NOTE — Telephone Encounter (Signed)
Notified patient of labs and she voices understanding.

## 2019-01-09 ENCOUNTER — Other Ambulatory Visit: Payer: Self-pay | Admitting: Neurology

## 2019-01-09 DIAGNOSIS — G43709 Chronic migraine without aura, not intractable, without status migrainosus: Secondary | ICD-10-CM

## 2019-01-16 ENCOUNTER — Other Ambulatory Visit: Payer: Self-pay | Admitting: Family Medicine

## 2019-01-16 NOTE — Telephone Encounter (Signed)
Last OV 11/21/18 Last refill 12/25/18 #90/0 Next OV 01/22/19

## 2019-01-22 ENCOUNTER — Ambulatory Visit (INDEPENDENT_AMBULATORY_CARE_PROVIDER_SITE_OTHER): Payer: BLUE CROSS/BLUE SHIELD | Admitting: Family Medicine

## 2019-01-22 ENCOUNTER — Other Ambulatory Visit: Payer: Self-pay | Admitting: Family Medicine

## 2019-01-22 VITALS — BP 145/84 | HR 69 | Temp 97.7°F | Ht 63.0 in | Wt 198.0 lb

## 2019-01-22 DIAGNOSIS — Z01818 Encounter for other preprocedural examination: Secondary | ICD-10-CM

## 2019-01-22 DIAGNOSIS — E785 Hyperlipidemia, unspecified: Secondary | ICD-10-CM

## 2019-01-22 DIAGNOSIS — R1011 Right upper quadrant pain: Secondary | ICD-10-CM

## 2019-01-22 DIAGNOSIS — D509 Iron deficiency anemia, unspecified: Secondary | ICD-10-CM

## 2019-01-22 DIAGNOSIS — G43709 Chronic migraine without aura, not intractable, without status migrainosus: Secondary | ICD-10-CM | POA: Diagnosis not present

## 2019-01-22 DIAGNOSIS — E1169 Type 2 diabetes mellitus with other specified complication: Secondary | ICD-10-CM | POA: Diagnosis not present

## 2019-01-22 DIAGNOSIS — F419 Anxiety disorder, unspecified: Secondary | ICD-10-CM

## 2019-01-22 DIAGNOSIS — G4700 Insomnia, unspecified: Secondary | ICD-10-CM

## 2019-01-22 DIAGNOSIS — F325 Major depressive disorder, single episode, in full remission: Secondary | ICD-10-CM

## 2019-01-22 LAB — COMPREHENSIVE METABOLIC PANEL
ALT: 16 U/L (ref 0–35)
AST: 14 U/L (ref 0–37)
Albumin: 4.1 g/dL (ref 3.5–5.2)
Alkaline Phosphatase: 52 U/L (ref 39–117)
BUN: 9 mg/dL (ref 6–23)
CO2: 24 mEq/L (ref 19–32)
Calcium: 9.2 mg/dL (ref 8.4–10.5)
Chloride: 104 mEq/L (ref 96–112)
Creatinine, Ser: 0.62 mg/dL (ref 0.40–1.20)
GFR: 104.88 mL/min (ref >60.00)
Glucose, Bld: 156 mg/dL — ABNORMAL HIGH (ref 70–99)
Potassium: 4.1 mEq/L (ref 3.5–5.1)
Sodium: 137 mEq/L (ref 135–145)
Total Bilirubin: 0.5 mg/dL (ref 0.2–1.2)
Total Protein: 6.6 g/dL (ref 6.0–8.3)

## 2019-01-22 LAB — CBC
HCT: 40.3 % (ref 36.0–46.0)
Hemoglobin: 13.3 g/dL (ref 12.0–15.0)
MCHC: 33 g/dL (ref 30.0–36.0)
MCV: 81.1 fl (ref 78.0–100.0)
Platelets: 263 10*3/uL (ref 150.0–400.0)
RBC: 4.97 Mil/uL (ref 3.87–5.11)
RDW: 14.3 % (ref 11.5–15.5)
WBC: 6.5 10*3/uL (ref 4.0–10.5)

## 2019-01-22 LAB — URINALYSIS, ROUTINE W REFLEX MICROSCOPIC
Bilirubin Urine: NEGATIVE
Hgb urine dipstick: NEGATIVE
Ketones, ur: NEGATIVE
Nitrite: NEGATIVE
Specific Gravity, Urine: 1.015 (ref 1.000–1.030)
Total Protein, Urine: NEGATIVE
Urine Glucose: NEGATIVE
Urobilinogen, UA: 0.2 (ref 0.0–1.0)
pH: 6 (ref 5.0–8.0)

## 2019-01-22 LAB — PROTIME-INR
INR: 1 ratio (ref 0.8–1.0)
Prothrombin Time: 11.4 s (ref 9.6–13.1)

## 2019-01-22 LAB — IBC + FERRITIN
Ferritin: 60.1 ng/mL (ref 10.0–291.0)
Iron: 81 ug/dL (ref 42–145)
Saturation Ratios: 25.5 % (ref 20.0–50.0)
Transferrin: 227 mg/dL (ref 212.0–360.0)

## 2019-01-22 LAB — HEMOGLOBIN A1C: Hgb A1c MFr Bld: 6.4 % (ref 4.6–6.5)

## 2019-01-22 MED ORDER — CLONAZEPAM 0.5 MG PO TABS: 0.5000 mg | ORAL_TABLET | Freq: Every day | ORAL | 5 refills | Status: DC

## 2019-01-22 MED ORDER — BELSOMRA 20 MG PO TABS: 20.0000 mg | ORAL_TABLET | Freq: Every day | ORAL | 5 refills | Status: DC

## 2019-01-22 MED ORDER — CLONAZEPAM 0.5 MG PO TABS: 0.5000 mg | ORAL_TABLET | ORAL | 0 refills | Status: DC | PRN

## 2019-01-22 MED ORDER — ONDANSETRON 4 MG PO TBDP: ORAL_TABLET | ORAL | 1 refills | Status: DC

## 2019-01-22 MED ORDER — AMOXICILLIN-POT CLAVULANATE 875-125 MG PO TABS: 1.0000 | ORAL_TABLET | Freq: Two times a day (BID) | ORAL | 0 refills | Status: DC

## 2019-01-22 MED ORDER — AZELASTINE HCL 0.1 % NA SOLN: 2.0000 | Freq: Two times a day (BID) | NASAL | 12 refills | Status: DC

## 2019-01-22 MED ORDER — METFORMIN HCL ER (MOD) 1000 MG PO TB24: 1000.0000 mg | ORAL_TABLET | Freq: Two times a day (BID) | ORAL | 3 refills | Status: DC

## 2019-01-22 NOTE — Assessment & Plan Note (Signed)
Check iron panel. 

## 2019-01-22 NOTE — Patient Instructions (Addendum)
It was very nice to see you today!  We will check blood work and a urine sample  Please try the nasal spray.   I will refill your other medications.   I think you may be having gallbladder issues - we will check and ultrasound.  We will see you back in 3-6 months depending on your blood work.   Take care, Dr Jerline Pain  Please try these tips to maintain a healthy lifestyle:   Eat at least 3 REAL meals and 1-2 snacks per day.  Aim for no more than 5 hours between eating.  If you eat breakfast, please do so within one hour of getting up.    Obtain twice as many fruits/vegetables as protein or carbohydrate foods for both lunch and dinner. (Half of each meal should be fruits/vegetables, one quarter protein, and one quarter starchy carbs)   Cut down on sweet beverages. This includes juice, soda, and sweet tea.    Exercise at least 150 minutes every week.

## 2019-01-22 NOTE — Assessment & Plan Note (Signed)
Doing better.  Will send in Belsomra 20 mg nightly.

## 2019-01-22 NOTE — Assessment & Plan Note (Signed)
Refill Klonopin.  Database with no red flags.

## 2019-01-22 NOTE — Assessment & Plan Note (Signed)
Continue management per psychiatry. 

## 2019-01-22 NOTE — Assessment & Plan Note (Addendum)
Check A1c.  Continue Metformin 1000 mg twice daily.  She is holding Januvia due to her nausea -may restart if we find another explanation for nausea and A1c is not controlled.

## 2019-01-22 NOTE — Assessment & Plan Note (Signed)
Continue Crestor 40 mg daily.

## 2019-01-22 NOTE — Progress Notes (Signed)
Chief Complaint:  Heidi Holland is a 43 y.o. female who presents today for consultation for surgical clearance at the request of Dr. Alvan Dame.  Assessment/Plan:  Encounter for surgical clearance EKG today normal.  Will check requested labs including CBC, C met, PT/INR, urinalysis, and A1c.  Depending on results of labs she will be cleared to have surgery.  Patient is low risk.  Nausea Concern for gallbladder etiology.  Will check right upper quadrant ultrasound.  We will continue Zofran as needed.  Sinus congestion Start Astelin nasal spray.  We will also send in "pocket prescription" for Augmentin with instruction not started with symptoms worsen or do not improve.  Insomnia Doing better.  Will send in Belsomra 20 mg nightly.  Depression, major, single episode, complete remission ([place]) Continue management per psychiatry.  Anxiety Refill Klonopin.  Database with no red flags.  Dyslipidemia associated with type 2 diabetes mellitus (HCC) Continue Crestor 40 mg daily.  T2DM (type 2 diabetes mellitus) (HCC) Check A1c.  Continue Metformin 1000 mg twice daily.  She is holding Januvia due to her nausea -may restart if we find another explanation for nausea and A1c is not controlled.  Iron deficiency anemia Check iron panel.     Subjective:  HPI:  Preop evaluation/left hip pain Patient will be undergoing total left hip replacement by Dr. Alvan Dame.  She is here today for surgical clearance.  She has had ongoing left hip pain for several years.  She has tried physical therapy, injections, and oral medications which have not provided long term relief. Scheduled for December 21.  Overall considers herself in good health.  She has very good functional status.  Able to climb a flight of stairs without chest pain or significant shortness of breath.  She has other concerns today outlined below:  Nausea She is also having some nausea for several weeks. Associated with several episodes of  vomiting. Some abdominal pain in RUQ. Usually occurs after eating. Some nausea currently. Uses Zofran which helps.  Sinus pressure Started several weeks ago.  Tried Nasacort and Sudafed with no significant improvement.  Feels like prior sinus infections.  Her stable, chronic medical conditions are outlined below:  # T2DM - On metformin 2034m daily - Home Sugars in low 100s - ROS: No reported polyuria or polydipsia  # Dyslipidemia - On crestor 463mdaily and tolerating well - ROS: No reported myalgias.  # Allergic Rhinitis / Asthma - On cetirizine 1039maily and singulair 68m55mily - Uses albuterol inhaler as needed - No recent flares  # Iron Deficiency - On oral iron supplementation but she is not sure if it is working  # GERD / Nausea / s/p gastric bypass - On protonix 40mg13mly and tolerating well - Takes zofran 4mg a77meeded  % Migraine Disorder - Follows with neurology - On Aimovig and takes sumitriptan as needed  % Polyarthralgia - Follows with Orthopedics - On Vitamin D supplementation -On gabapentin 600 mg 3 times daily.  % Anxiety / Depression / Insomnia - Follows with psychiatry - On trintellix 68mg d65m, klonopin 0.5mg as 44mded, Rexulti 0.5mg dail41m Takes belsomra  as needed for sleep - ROS: No reported SI or HI.  ROS: Per HPI, otherwise a complete review of systems was negative.   PMH:  The following were reviewed and entered/updated in epic: Past Medical History:  Diagnosis Date  . Allergy   . Anxiety   . Asthma   . Depression   . Diabetes mellitus  without complication ([place])   . Diverticulitis   . GERD (gastroesophageal reflux disease)   . HPV in female   . Hyperlipidemia   . Migraines    Patient Active Problem List   Diagnosis Date Noted  . T2DM (type 2 diabetes mellitus) ([place]) 07/31/2018  . Dyslipidemia associated with type 2 diabetes mellitus ([place]) 07/31/2018  . Allergic rhinitis 07/31/2018  . Mild intermittent asthma,  uncomplicated 78/67/6720  . GERD (gastroesophageal reflux disease) 07/31/2018  . S/P gastric bypass 07/31/2018  . Migraine 07/31/2018  . Anxiety 07/31/2018  . Depression, major, single episode, complete remission ([place]) 07/31/2018  . Insomnia 07/31/2018  . Umbilical hernia 94/70/9628  . Iron deficiency anemia 06/09/2018  . Cervicogenic headache 05/12/2018  . Low back pain 05/12/2018  . Polyarthralgia 05/12/2018   Past Surgical History:  Procedure Laterality Date  . BOWEL RESECTION    . gasticbypass    . GASTRIC BYPASS    . KNEE ARTHROSCOPY      Family History  Problem Relation Age of Onset  . Heart disease Mother   . Lung cancer Mother   . Rheum arthritis Mother   . Arthritis Mother   . Cancer Mother   . COPD Mother   . Miscarriages / [place] Mother   . Diabetes Father   . Heart disease Father   . Breast cancer Paternal Aunt     Medications- reviewed and updated Current Outpatient Medications  Medication Sig Dispense Refill  . acetaminophen (TYLENOL) 325 MG tablet Take 650 mg by mouth every 6 (six) hours as needed for mild pain.    Marland Kitchen albuterol (VENTOLIN HFA) 108 (90 Base) MCG/ACT inhaler Inhale 2 puffs into the lungs every 6 (six) hours as needed for wheezing or shortness of breath. 18 g 2  . blood glucose meter kit and supplies KIT Dispense based on patient and insurance preference. Use up to four times daily as directed. (FOR ICD-9 250.00, 250.01). 1 each 0  . cetirizine (ZYRTEC) 10 MG tablet Take 1 tablet (10 mg total) by mouth daily. 90 tablet 1  . Continuous Blood Gluc Receiver (FREESTYLE LIBRE READER) DEVI 1 Device by Does not apply route daily as needed (to check blood sugar). 1 Device 0  . Continuous Blood Gluc Sensor (FREESTYLE LIBRE SENSOR SYSTEM) MISC Apply 1 sensor to skin every 14 days. 2 each 11  . gabapentin (NEURONTIN) 600 MG tablet TAKE 1 TABLET BY MOUTH THREE TIMES A DAY 270 tablet 1  . hydrOXYzine (ATARAX/VISTARIL) 50 MG tablet TAKE 0.5-2 TABLETS  (25-100 MG TOTAL) BY MOUTH AT BEDTIME AS NEEDED FOR ANXIETY. 180 tablet 1  . IRON-VITAMIN C PO Take by mouth.    . montelukast (SINGULAIR) 10 MG tablet Take 10 mg by mouth at bedtime.    . ondansetron (ZOFRAN-ODT) 4 MG disintegrating tablet TAKE 1 TABLET BY MOUTH EVERY 8 HOURS AS NEEDED FOR NAUSEA AND VOMITING 20 tablet 1  . REXULTI 0.5 MG TABS Take 1 tablet by mouth daily.    . rosuvastatin (CRESTOR) 40 MG tablet Take 1 tablet (40 mg total) by mouth daily. 90 tablet 2  . SUMAtriptan (IMITREX) 50 MG tablet TAKE 1 TABLET EVERY 2 HOURS AS NEEDED FOR MIGRAINE. MAY REPEAT IN 2 HRS IF HEADACHE PERSISTS/RECURS. 9 tablet 1  . tiZANidine (ZANAFLEX) 4 MG capsule TAKE 1 CAPSULE BY MOUTH 3 TIMES A DAY 90 capsule 0  . TOSYMRA 10 MG/ACT SOLN USE 1 SPRAY IN THE NOSE EVERY HOUR AS NEEDED (MAX 3 SPRAYS PER 24 HOURS)  1 each 0  . Vitamin D, Ergocalciferol, (DRISDOL) 1.25 MG (50000 UT) CAPS capsule TAKE 1 CAPSULE BY MOUTH EVERY 7 DAYS 12 capsule 0  . vortioxetine HBr (TRINTELLIX) 10 MG TABS tablet Take 10 mg by mouth daily.    Marland Kitchen amoxicillin-clavulanate (AUGMENTIN) 875-125 MG tablet Take 1 tablet by mouth 2 (two) times daily. 14 tablet 0  . azelastine (ASTELIN) 0.1 % nasal spray Place 2 sprays into both nostrils 2 (two) times daily. 30 mL 12  . clonazePAM (KLONOPIN) 0.5 MG tablet Take 1 tablet (0.5 mg total) by mouth daily. 30 tablet 5  . Erenumab-aooe (AIMOVIG) 70 MG/ML SOAJ Inject 70 mg into the skin every 30 (thirty) days. 1 pen 11  . metFORMIN (GLUMETZA) 1000 MG (MOD) 24 hr tablet Take 1 tablet (1,000 mg total) by mouth 2 (two) times daily with a meal. 180 tablet 3  . pantoprazole (PROTONIX) 40 MG tablet Take 1 tablet (40 mg total) by mouth every evening. 90 tablet 2  . sitaGLIPtin (JANUVIA) 100 MG tablet Take 1 tablet (100 mg total) by mouth daily. (Patient not taking: Reported on 01/22/2019) 90 tablet 3  . Suvorexant (BELSOMRA) 20 MG TABS Take 20 mg by mouth at bedtime. 30 tablet 5   No current  facility-administered medications for this visit.    Allergies-reviewed and updated Allergies  Allergen Reactions  . Ciprofloxacin Itching    Social History   Socioeconomic History  . Marital status: Single    Spouse name: Not on file  . Number of children: 0  . Years of education: 34  . Highest education level: Associate degree: occupational, Hotel manager, or vocational program  Occupational History    Employer: [employer]  Tobacco Use  . Smoking status: Never Smoker  . Smokeless tobacco: Never Used  Substance and Sexual Activity  . Alcohol use: Yes    Comment: Occ  . Drug use: Yes    Comment: THC  . Sexual activity: Never  Other Topics Concern  . Not on file  Social History Narrative   Right handed.    Social Determinants of Health   Financial Resource Strain:   . Difficulty of Paying Living Expenses: Not on file  Food Insecurity:   . Worried About Charity fundraiser in the Last Year: Not on file  . Ran Out of Food in the Last Year: Not on file  Transportation Needs:   . Lack of Transportation (Medical): Not on file  . Lack of Transportation (Non-Medical): Not on file  Physical Activity:   . Days of Exercise per Week: Not on file  . Minutes of Exercise per Session: Not on file  Stress:   . Feeling of Stress : Not on file  Social Connections:   . Frequency of Communication with Friends and Family: Not on file  . Frequency of Social Gatherings with Friends and Family: Not on file  . Attends Religious Services: Not on file  . Active Member of Clubs or Organizations: Not on file  . Attends Archivist Meetings: Not on file  . Marital Status: Not on file         Objective:  Physical Exam: BP (!) 145/84   Pulse 69   Temp 97.7 F (36.5 C)   Ht _0  (1.6 m)   Wt 198 lb (89.8 kg)   SpO2 97%   BMI 35.07 kg/m   Wt Readings from Last 3 Encounters:  01/22/19 198 lb (89.8 kg)  09/17/18 215 lb 3.2 oz (97.6  kg)  08/08/18 209 lb 3.2 oz (94.9 kg)  Gen: NAD,  resting comfortably CV: Regular rate and rhythm with no murmurs appreciated Pulm: Normal work of breathing, clear to auscultation bilaterally with no crackles, wheezes, or rhonchi GI: Normal bowel sounds present. Soft, mildly tender to palpation in right upper quadrant.  Murphy sign negative. MSK: No edema, cyanosis, or clubbing noted Skin: Warm, dry Neuro: Grossly normal, moves all extremities Psych: Normal affect and thought content      A copy of this note will be forwarded to the requesting physician.   Algis Greenhouse. Jerline Pain, MD 01/22/2019 12:41 PM

## 2019-01-23 NOTE — Progress Notes (Signed)
Please inform patient of the following:  Good news! A1c is 6.4 All of her other blood work is NORMAL. She is clear to have surgery - ok to fill out her forms.  Algis Greenhouse. Jerline Pain, MD 01/23/2019 10:24 AM

## 2019-01-28 ENCOUNTER — Other Ambulatory Visit: Payer: Self-pay

## 2019-01-28 MED ORDER — METFORMIN HCL 1000 MG PO TABS: 1000.0000 mg | ORAL_TABLET | Freq: Two times a day (BID) | ORAL | 3 refills | Status: DC

## 2019-01-29 DIAGNOSIS — F4310 Post-traumatic stress disorder, unspecified: Secondary | ICD-10-CM | POA: Diagnosis not present

## 2019-02-02 DIAGNOSIS — M1612 Unilateral primary osteoarthritis, left hip: Secondary | ICD-10-CM | POA: Diagnosis not present

## 2019-02-02 DIAGNOSIS — Z96651 Presence of right artificial knee joint: Secondary | ICD-10-CM | POA: Diagnosis not present

## 2019-02-09 ENCOUNTER — Other Ambulatory Visit: Payer: BLUE CROSS/BLUE SHIELD

## 2019-02-10 ENCOUNTER — Other Ambulatory Visit: Payer: Self-pay | Admitting: Neurology

## 2019-02-10 ENCOUNTER — Other Ambulatory Visit: Payer: Self-pay

## 2019-02-10 DIAGNOSIS — G43709 Chronic migraine without aura, not intractable, without status migrainosus: Secondary | ICD-10-CM

## 2019-02-10 MED ORDER — SUMATRIPTAN SUCCINATE 50 MG PO TABS: ORAL_TABLET | ORAL | 1 refills | Status: DC

## 2019-02-10 NOTE — Telephone Encounter (Signed)
Pls ask patient if it helped her, if it did, ok to give one refill then she has to schedule f/u with Dr. Tomi Likens for further refills. Thanks

## 2019-02-11 ENCOUNTER — Other Ambulatory Visit: Payer: Self-pay

## 2019-02-11 MED ORDER — TIZANIDINE HCL 4 MG PO CAPS: 4.0000 mg | ORAL_CAPSULE | Freq: Three times a day (TID) | ORAL | 0 refills | Status: DC

## 2019-02-16 NOTE — Telephone Encounter (Signed)
Needs to be seen for future refills

## 2019-02-18 ENCOUNTER — Telehealth: Payer: Self-pay | Admitting: Family Medicine

## 2019-02-18 NOTE — Telephone Encounter (Signed)
Patient called in and wanted to see if there was an alternate Suvorexant (BELSOMRA) 20 MG TABS for this medication since it is now over $200. Please advise.

## 2019-02-18 NOTE — Telephone Encounter (Signed)
Please advise 

## 2019-02-19 MED ORDER — RAMELTEON 8 MG PO TABS: 8.0000 mg | ORAL_TABLET | Freq: Every day | ORAL | 5 refills | Status: DC

## 2019-02-19 NOTE — Addendum Note (Signed)
Addended by: Ardith Dark on: 02/19/2019 08:22 AM   Modules accepted: Orders

## 2019-02-19 NOTE — Telephone Encounter (Signed)
Sent in ramelteon.  Heidi Holland. [name], MD 02/19/2019 8:22 AM

## 2019-02-19 NOTE — Telephone Encounter (Signed)
Patient notified

## 2019-03-05 ENCOUNTER — Other Ambulatory Visit: Payer: Self-pay | Admitting: Family Medicine

## 2019-03-09 ENCOUNTER — Other Ambulatory Visit: Payer: Self-pay | Admitting: Family Medicine

## 2019-03-09 ENCOUNTER — Telehealth: Payer: Self-pay | Admitting: Family Medicine

## 2019-03-09 NOTE — Telephone Encounter (Signed)
MEDICATION:tiZANidine (ZANAFLEX) 4 MG capsule  PHARMACY:[address] [place], [place], [place] [address]  Comments: asked to send prescriptions to this location now due to insurance change  **Let patient know to contact pharmacy at the end of the day to make sure medication is ready. **  ** Please notify patient to allow 48-72 hours to process**  **Encourage patient to contact the pharmacy for refills or they can request refills through [facility]**

## 2019-03-09 NOTE — Telephone Encounter (Signed)
Rx sent 

## 2019-03-10 ENCOUNTER — Telehealth: Payer: Self-pay | Admitting: Family Medicine

## 2019-03-10 ENCOUNTER — Other Ambulatory Visit: Payer: Self-pay

## 2019-03-10 DIAGNOSIS — F4310 Post-traumatic stress disorder, unspecified: Secondary | ICD-10-CM | POA: Diagnosis not present

## 2019-03-10 MED ORDER — TIZANIDINE HCL 4 MG PO CAPS: 4.0000 mg | ORAL_CAPSULE | Freq: Three times a day (TID) | ORAL | 0 refills | Status: DC

## 2019-03-10 NOTE — Telephone Encounter (Signed)
Rx sent 

## 2019-03-10 NOTE — Telephone Encounter (Signed)
**  Remind patient they can make refill requests via MyChart**  Medication refill request (Name & Dosage):  Tizanidine ( Zanaflex )   Preferred pharmacy (Name & Address):  [facility]   Other comments (if applicable):   Rx was sent to the wrong pharmacy - she is completely out - please call in asap for her.

## 2019-03-10 NOTE — Telephone Encounter (Signed)
Error

## 2019-03-12 ENCOUNTER — Ambulatory Visit (INDEPENDENT_AMBULATORY_CARE_PROVIDER_SITE_OTHER): Payer: BC Managed Care – PPO | Admitting: Family Medicine

## 2019-03-12 ENCOUNTER — Other Ambulatory Visit: Payer: Self-pay | Admitting: Family Medicine

## 2019-03-12 DIAGNOSIS — Z96642 Presence of left artificial hip joint: Secondary | ICD-10-CM | POA: Insufficient documentation

## 2019-03-12 DIAGNOSIS — G4700 Insomnia, unspecified: Secondary | ICD-10-CM | POA: Diagnosis not present

## 2019-03-12 MED ORDER — ZOLPIDEM TARTRATE 5 MG PO TABS: 5.0000 mg | ORAL_TABLET | Freq: Every evening | ORAL | 5 refills | Status: DC | PRN

## 2019-03-12 NOTE — Assessment & Plan Note (Addendum)
Poorly controlled.  No longer able to afford Belsomra.  Will restart Ambien 5 mg daily.  Database with no red flags.  Patient aware possible interaction with other medications including her clonazepam.  She recently had hip replacement surgery but is now doing well with limited Norco use.  We will also check on status of her referral to sleep studies.

## 2019-03-12 NOTE — Progress Notes (Signed)
   Heidi Holland is a 44 y.o. female who presents today for a virtual office visit.  Assessment/Plan:  Chronic Problems Addressed Today: Insomnia Poorly controlled.  No longer able to afford Belsomra.  Will restart Ambien 5 mg daily.  Database with no red flags.  Patient aware possible interaction with other medications including her clonazepam.  She recently had hip replacement surgery but is now doing well with limited Norco use.  We will also check on status of her referral to sleep studies.     Subjective:  HPI:  Patient has had more difficult time sleeping.  She was on Belsomra in the past which worked partially well however is not able to afford to do more due to lack of insurance coverage.  She tried ramelteon which did not work well for her.  She has been on several medications in the past and notes that Ambien is worked best for her.  Her psychiatrist did previously manage this for her. Tried seroquel  besomra worked but is no very Publishing copy  Physical Exam: Gen: NAD, resting comfortably Pulm: Normal work of breathing Neuro: Grossly normal, moves all extremities Psych: Normal affect and thought content  Virtual Visit via Video   I connected with Heidi Holland on 03/12/19 at 11:20 AM EST by a video enabled telemedicine application and verified that I am speaking with the correct person using two identifiers. The limitations of evaluation and management by telemedicine and the availability of in person appointments were discussed. The patient expressed understanding and agreed to proceed.   Patient location: Home Provider location: [place] [facility] [employer] [facility] Persons participating in the virtual visit: Myself and Patient     [name]. [name], MD 03/12/2019 12:09 PM

## 2019-03-17 ENCOUNTER — Other Ambulatory Visit: Payer: Self-pay | Admitting: Neurology

## 2019-03-17 DIAGNOSIS — G43709 Chronic migraine without aura, not intractable, without status migrainosus: Secondary | ICD-10-CM

## 2019-03-18 DIAGNOSIS — Z471 Aftercare following joint replacement surgery: Secondary | ICD-10-CM | POA: Diagnosis not present

## 2019-03-18 DIAGNOSIS — Z96642 Presence of left artificial hip joint: Secondary | ICD-10-CM | POA: Diagnosis not present

## 2019-03-28 ENCOUNTER — Other Ambulatory Visit: Payer: Self-pay | Admitting: Family Medicine

## 2019-03-30 ENCOUNTER — Other Ambulatory Visit: Payer: Self-pay | Admitting: *Deleted

## 2019-03-30 MED ORDER — GABAPENTIN 600 MG PO TABS: 600.0000 mg | ORAL_TABLET | Freq: Three times a day (TID) | ORAL | 1 refills | Status: DC

## 2019-04-03 ENCOUNTER — Other Ambulatory Visit: Payer: Self-pay | Admitting: Family Medicine

## 2019-04-03 ENCOUNTER — Telehealth: Payer: Self-pay

## 2019-04-03 NOTE — Telephone Encounter (Signed)
Rx sent on 03/30/2019

## 2019-04-03 NOTE — Telephone Encounter (Signed)
Patient would like this refilled to be in tablet instead of capsule. Patient placed this refill request about an hour ago through her pharmacy

## 2019-04-03 NOTE — Telephone Encounter (Signed)
MEDICATION: gabapentin (NEURONTIN) 600 MG tablet  PHARMACY:Preferred Pharmacies   [id number] [place], [place] - [address] Phone:  [phone]  Fax:  [phone]       Comments:   **Let patient know to contact pharmacy at the end of the day to make sure medication is ready. **  ** Please notify patient to allow 48-72 hours to process**  **Encourage patient to contact the pharmacy for refills or they can request refills through [facility]**

## 2019-04-16 ENCOUNTER — Other Ambulatory Visit: Payer: Self-pay | Admitting: Neurology

## 2019-04-16 DIAGNOSIS — G43709 Chronic migraine without aura, not intractable, without status migrainosus: Secondary | ICD-10-CM

## 2019-04-28 ENCOUNTER — Other Ambulatory Visit: Payer: Self-pay

## 2019-04-28 ENCOUNTER — Telehealth: Payer: Self-pay | Admitting: Family Medicine

## 2019-04-28 MED ORDER — TIZANIDINE HCL 4 MG PO CAPS: 4.0000 mg | ORAL_CAPSULE | Freq: Three times a day (TID) | ORAL | 0 refills | Status: DC

## 2019-04-28 MED ORDER — TIZANIDINE HCL 4 MG PO TABS: 4.0000 mg | ORAL_TABLET | Freq: Three times a day (TID) | ORAL | 0 refills | Status: DC

## 2019-04-28 NOTE — Telephone Encounter (Signed)
Rx sent 

## 2019-04-28 NOTE — Telephone Encounter (Signed)
  LAST APPOINTMENT DATE: 04/03/2019   NEXT APPOINTMENT DATE:@Visit  date not found  MEDICATION:tiZANidine (ZANAFLEX) 4 MG capsule  PHARMACY:[id number] - [place], [place] - [address]  COMMENT: Patient would like a returned call when done, if patient doesn't pick up asked to LVM. **Let patient know to contact pharmacy at the end of the day to make sure medication is ready. **  ** Please notify patient to allow 48-72 hours to process**  **Encourage patient to contact the pharmacy for refills or they can request refills through [facility]**  CLINICAL FILLS OUT ALL BELOW:   LAST REFILL:  QTY:  REFILL DATE:    OTHER COMMENTS:    Okay for refill?  Please advise

## 2019-04-28 NOTE — Telephone Encounter (Signed)
Patient is needing the medication to be sent to [facility] in tablet form

## 2019-05-01 ENCOUNTER — Encounter: Payer: Self-pay | Admitting: Neurology

## 2019-05-01 NOTE — Progress Notes (Signed)
Heidi Holland (Key: Weldon Picking) Aimovig 70MG /ML auto-injectors   Form [employer] Electronic PA Form [phone] NCPDP) Created 9 hours ago Sent to Plan 3 minutes ago Plan Response 3 minutes ago Submit Clinical Questions less than a minute ago Determination Favorable less than a minute ago Message from Plan CaseId:[id number];Status:Approved;Review Type:Prior Auth;Coverage Start Date:04/01/2019;Coverage End Date:04/30/2020;

## 2019-05-04 ENCOUNTER — Other Ambulatory Visit: Payer: Self-pay | Admitting: Neurology

## 2019-05-04 DIAGNOSIS — G43709 Chronic migraine without aura, not intractable, without status migrainosus: Secondary | ICD-10-CM

## 2019-05-05 ENCOUNTER — Telehealth: Payer: Self-pay | Admitting: Neurology

## 2019-05-05 NOTE — Telephone Encounter (Signed)
The following message was left with [employer] on 05/05/19 at 12:49 PM.  Caller is needing a refill on her migraine medication, Aimovig injectable, and also her nasal spray.  [facility] [address]  I returned the call and left patient a message to call back with the name of the nasal spray.

## 2019-05-06 ENCOUNTER — Other Ambulatory Visit: Payer: Self-pay | Admitting: Neurology

## 2019-05-06 DIAGNOSIS — G43709 Chronic migraine without aura, not intractable, without status migrainosus: Secondary | ICD-10-CM

## 2019-05-06 MED ORDER — TOSYMRA 10 MG/ACT NA SOLN: 1.0000 | NASAL | 4 refills | Status: DC | PRN

## 2019-05-06 MED ORDER — AIMOVIG 70 MG/ML ~~LOC~~ SOAJ: 70.0000 mg | SUBCUTANEOUS | 5 refills | Status: DC

## 2019-05-06 NOTE — Telephone Encounter (Signed)
I sent prescriptions for both Aimovig and Tosymra to [facility] [address].  However, she has not been seen in over a year and it will be close to 1 1/2 years for her scheduled appointment in August.  She must make the follow up appointment or I will be unable to prescribe further refills.

## 2019-05-06 NOTE — Telephone Encounter (Signed)
Pt called Dr Everlena Cooper  sent prescriptions for both Aimovig and [facility]ymra to [facility] [address].  However, she has not been seen in over a year and it will be close to 1 1/2 years for her scheduled appointment in August.  She must make the follow up appointment or I will be unable to prescribe further refills. Pt stated on wait list for sooner appointment

## 2019-05-08 DIAGNOSIS — Z471 Aftercare following joint replacement surgery: Secondary | ICD-10-CM | POA: Diagnosis not present

## 2019-05-08 DIAGNOSIS — Z96642 Presence of left artificial hip joint: Secondary | ICD-10-CM | POA: Diagnosis not present

## 2019-05-27 ENCOUNTER — Other Ambulatory Visit: Payer: Self-pay | Admitting: Family Medicine

## 2019-05-28 ENCOUNTER — Other Ambulatory Visit: Payer: Self-pay | Admitting: Family Medicine

## 2019-05-30 ENCOUNTER — Other Ambulatory Visit: Payer: Self-pay | Admitting: Family Medicine

## 2019-06-02 ENCOUNTER — Other Ambulatory Visit: Payer: Self-pay | Admitting: Family Medicine

## 2019-06-09 ENCOUNTER — Other Ambulatory Visit: Payer: Self-pay | Admitting: Family Medicine

## 2019-06-11 DIAGNOSIS — F4310 Post-traumatic stress disorder, unspecified: Secondary | ICD-10-CM | POA: Diagnosis not present

## 2019-06-18 ENCOUNTER — Other Ambulatory Visit: Payer: Self-pay | Admitting: Family Medicine

## 2019-06-20 ENCOUNTER — Other Ambulatory Visit: Payer: Self-pay

## 2019-06-20 ENCOUNTER — Emergency Department (HOSPITAL_COMMUNITY): Payer: BC Managed Care – PPO

## 2019-06-20 ENCOUNTER — Encounter (HOSPITAL_COMMUNITY): Payer: Self-pay | Admitting: Emergency Medicine

## 2019-06-20 ENCOUNTER — Emergency Department (HOSPITAL_COMMUNITY)
Admission: EM | Admit: 2019-06-20 | Discharge: 2019-06-20 | Disposition: A | Discharge status: Home / Self Care | Payer: BC Managed Care – PPO | Attending: Emergency Medicine | Admitting: Emergency Medicine

## 2019-06-20 DIAGNOSIS — R1010 Upper abdominal pain, unspecified: Secondary | ICD-10-CM | POA: Diagnosis not present

## 2019-06-20 DIAGNOSIS — E119 Type 2 diabetes mellitus without complications: Secondary | ICD-10-CM | POA: Diagnosis not present

## 2019-06-20 DIAGNOSIS — R109 Unspecified abdominal pain: Secondary | ICD-10-CM | POA: Diagnosis not present

## 2019-06-20 DIAGNOSIS — Z96642 Presence of left artificial hip joint: Secondary | ICD-10-CM | POA: Insufficient documentation

## 2019-06-20 DIAGNOSIS — Z79899 Other long term (current) drug therapy: Secondary | ICD-10-CM | POA: Diagnosis not present

## 2019-06-20 DIAGNOSIS — F1210 Cannabis abuse, uncomplicated: Secondary | ICD-10-CM | POA: Insufficient documentation

## 2019-06-20 DIAGNOSIS — R1013 Epigastric pain: Secondary | ICD-10-CM | POA: Diagnosis not present

## 2019-06-20 DIAGNOSIS — J45909 Unspecified asthma, uncomplicated: Secondary | ICD-10-CM | POA: Diagnosis not present

## 2019-06-20 DIAGNOSIS — R112 Nausea with vomiting, unspecified: Secondary | ICD-10-CM

## 2019-06-20 DIAGNOSIS — R0789 Other chest pain: Secondary | ICD-10-CM | POA: Diagnosis not present

## 2019-06-20 LAB — CBC
HCT: 39.8 % (ref 36.0–46.0)
Hemoglobin: 13.3 g/dL (ref 12.0–15.0)
MCH: 27.7 pg (ref 26.0–34.0)
MCHC: 33.4 g/dL (ref 30.0–36.0)
MCV: 82.7 fL (ref 80.0–100.0)
Platelets: 265 10*3/uL (ref 150–400)
RBC: 4.81 MIL/uL (ref 3.87–5.11)
RDW: 13.4 % (ref 11.5–15.5)
WBC: 6.6 10*3/uL (ref 4.0–10.5)
nRBC: 0 % (ref 0.0–0.2)

## 2019-06-20 LAB — COMPREHENSIVE METABOLIC PANEL
ALT: 16 U/L (ref 0–44)
AST: 16 U/L (ref 15–41)
Albumin: 3.9 g/dL (ref 3.5–5.0)
Alkaline Phosphatase: 68 U/L (ref 38–126)
Anion gap: 9 (ref 5–15)
BUN: 11 mg/dL (ref 6–20)
CO2: 24 mmol/L (ref 22–32)
Calcium: 9.4 mg/dL (ref 8.9–10.3)
Chloride: 103 mmol/L (ref 98–111)
Creatinine, Ser: 0.67 mg/dL (ref 0.44–1.00)
GFR calc Af Amer: >60 mL/min (ref >60)
GFR calc non Af Amer: >60 mL/min (ref >60)
Glucose, Bld: 116 mg/dL — ABNORMAL HIGH (ref 70–99)
Potassium: 4.1 mmol/L (ref 3.5–5.1)
Sodium: 136 mmol/L (ref 135–145)
Total Bilirubin: 0.4 mg/dL (ref 0.3–1.2)
Total Protein: 7.1 g/dL (ref 6.5–8.1)

## 2019-06-20 LAB — URINALYSIS, ROUTINE W REFLEX MICROSCOPIC
Bilirubin Urine: NEGATIVE
Glucose, UA: NEGATIVE mg/dL
Hgb urine dipstick: NEGATIVE
Ketones, ur: NEGATIVE mg/dL
Leukocytes,Ua: NEGATIVE
Nitrite: NEGATIVE
Protein, ur: NEGATIVE mg/dL
Specific Gravity, Urine: 1.009 (ref 1.005–1.030)
pH: 5 (ref 5.0–8.0)

## 2019-06-20 LAB — I-STAT BETA HCG BLOOD, ED (MC, WL, AP ONLY): I-stat hCG, quantitative: <5 m[IU]/mL (ref <5)

## 2019-06-20 LAB — LIPASE, BLOOD: Lipase: 34 U/L (ref 11–51)

## 2019-06-20 MED ORDER — SODIUM CHLORIDE 0.9 % IV BOLUS
1000.0000 mL | Freq: Once | INTRAVENOUS | Status: AC
  Administered 2019-06-20: 1000 mL via INTRAVENOUS

## 2019-06-20 MED ORDER — PROMETHAZINE HCL 25 MG/ML IJ SOLN
12.5000 mg | Freq: Once | INTRAMUSCULAR | Status: AC
  Administered 2019-06-20: 12.5 mg via INTRAVENOUS
  Filled 2019-06-20: qty 1

## 2019-06-20 MED ORDER — PROMETHAZINE HCL 25 MG PO TABS
25.0000 mg | ORAL_TABLET | Freq: Four times a day (QID) | ORAL | 0 refills | Status: DC | PRN
Start: 2019-06-20 — End: 2019-07-20

## 2019-06-20 MED ORDER — LIDOCAINE VISCOUS HCL 2 % MT SOLN
15.0000 mL | Freq: Once | OROMUCOSAL | Status: AC
  Administered 2019-06-20: 15 mL via ORAL
  Filled 2019-06-20: qty 15

## 2019-06-20 MED ORDER — SUCRALFATE 1 GM/10ML PO SUSP
1.0000 g | Freq: Three times a day (TID) | ORAL | 0 refills | Status: DC
Start: 2019-06-20 — End: 2019-10-23

## 2019-06-20 MED ORDER — HYDROMORPHONE HCL 1 MG/ML IJ SOLN
1.0000 mg | Freq: Once | INTRAMUSCULAR | Status: AC
  Administered 2019-06-20: 1 mg via INTRAVENOUS
  Filled 2019-06-20: qty 1

## 2019-06-20 MED ORDER — IOHEXOL 300 MG/ML  SOLN
100.0000 mL | Freq: Once | INTRAMUSCULAR | Status: AC | PRN
  Administered 2019-06-20: 100 mL via INTRAVENOUS

## 2019-06-20 MED ORDER — ALUM & MAG HYDROXIDE-SIMETH 200-200-20 MG/5ML PO SUSP
30.0000 mL | Freq: Once | ORAL | Status: AC
  Administered 2019-06-20: 30 mL via ORAL
  Filled 2019-06-20: qty 30

## 2019-06-20 MED ORDER — MORPHINE SULFATE (PF) 4 MG/ML IV SOLN
4.0000 mg | Freq: Once | INTRAVENOUS | Status: AC
  Administered 2019-06-20: 4 mg via INTRAVENOUS
  Filled 2019-06-20: qty 1

## 2019-06-20 MED ORDER — SODIUM CHLORIDE 0.9% FLUSH
3.0000 mL | Freq: Once | INTRAVENOUS | Status: AC
  Administered 2019-06-20: 3 mL via INTRAVENOUS

## 2019-06-20 NOTE — ED Triage Notes (Signed)
C/o nausea, vomiting, upper abd pain, and abd distention since Monday.  Hx of gastric bypass.

## 2019-06-20 NOTE — Discharge Instructions (Signed)
Take the medication as prescribed.  Return for any new or worsening symptoms otherwise follow-up with primary care provider

## 2019-06-20 NOTE — ED Notes (Signed)
Patient verbalizes understanding of discharge instructions. Opportunity for questioning and answers were provided. Armband removed by staff, pt discharged from ED. Pt. ambulatory and discharged home.  

## 2019-06-20 NOTE — ED Provider Notes (Signed)
[facility] EMERGENCY DEPARTMENT Provider Note   CSN: [id number] Arrival date & time: 06/20/19  1526     History Chief Complaint  Patient presents with  . Abdominal Pain    Heidi Holland is a 44 y.o. female with past medical history significant for diabetes, depression, asthma, GERD, hyperlipidemia, s/p gastric bypass 2017 who presents for evaluation of abdominal pain.  Patient with multiple episodes of NBNB emesis, persistent nausea, upper abdominal pain onset Monday, 5 days PTA.  Has been taking Zofran without relief.  Feels like her abdomen is distended.  No associated diarrhea.  Last bowel movement 3 days ago which she state is "normal" for her.  Denies fever, chest pain, shortness of breath, dysuria, pelvic pain, vaginal discharge, concerns for pregnancy.  Denies additional aggravating or alleviating factors.  Denies known cholelithiasis.  Rates her pain a 7/10.  Describes as an aching.  Does have history of GERD however states this does not feel similar.   History obtained from patient and past medical records.  No interpreter is used.  HPI     Past Medical History:  Diagnosis Date  . Allergy   . Anxiety   . Asthma   . Depression   . Diabetes mellitus without complication ([place])   . Diverticulitis   . GERD (gastroesophageal reflux disease)   . HPV in female   . Hyperlipidemia   . Migraines     Patient Active Problem List   Diagnosis Date Noted  . Status post left hip replacement 03/12/2019  . T2DM (type 2 diabetes mellitus) ([place]) 07/31/2018  . Dyslipidemia associated with type 2 diabetes mellitus ([place]) 07/31/2018  . Allergic rhinitis 07/31/2018  . Mild intermittent asthma, uncomplicated 54/56/2563  . GERD (gastroesophageal reflux disease) 07/31/2018  . S/P gastric bypass 07/31/2018  . Migraine 07/31/2018  . Anxiety 07/31/2018  . Depression, major, single episode, complete remission ([place]) 07/31/2018  . Insomnia 07/31/2018  . Umbilical hernia  89/37/3428  . Iron deficiency anemia 06/09/2018  . Cervicogenic headache 05/12/2018  . Low back pain 05/12/2018  . Polyarthralgia 05/12/2018    Past Surgical History:  Procedure Laterality Date  . BOWEL RESECTION    . gasticbypass    . GASTRIC BYPASS    . KNEE ARTHROSCOPY    . TOTAL HIP ARTHROPLASTY Left      OB History   No obstetric history on file.     Family History  Problem Relation Age of Onset  . Heart disease Mother   . Lung cancer Mother   . Rheum arthritis Mother   . Arthritis Mother   . Cancer Mother   . COPD Mother   . Miscarriages / [place] Mother   . Diabetes Father   . Heart disease Father   . Breast cancer Paternal Aunt     Social History   Tobacco Use  . Smoking status: Never Smoker  . Smokeless tobacco: Never Used  Substance Use Topics  . Alcohol use: Yes    Comment: Occ  . Drug use: Yes    Types: Marijuana    Comment: THC    Home Medications Prior to Admission medications   Medication Sig Start Date End Date Taking? Authorizing Provider  acetaminophen (TYLENOL) 325 MG tablet Take 650 mg by mouth every 6 (six) hours as needed for mild pain.    [provider]  albuterol (VENTOLIN HFA) 108 (90 Base) MCG/ACT inhaler Inhale 2 puffs into the lungs every 6 (six) hours as needed for wheezing or  shortness of breath. 09/17/18   Vivi Barrack, MD  amoxicillin-clavulanate (AUGMENTIN) 875-125 MG tablet Take 1 tablet by mouth 2 (two) times daily. 01/22/19   Vivi Barrack, MD  azelastine (ASTELIN) 0.1 % nasal spray Place 2 sprays into both nostrils 2 (two) times daily. 01/22/19   Vivi Barrack, MD  blood glucose meter kit and supplies KIT Dispense based on patient and insurance preference. Use up to four times daily as directed. (FOR ICD-9 250.00, 250.01). 07/31/18   Vivi Barrack, MD  cetirizine (ZYRTEC) 10 MG tablet Take 1 tablet (10 mg total) by mouth daily. 09/17/18   Vivi Barrack, MD  clonazePAM (KLONOPIN) 0.5 MG tablet Take 1  tablet (0.5 mg total) by mouth daily. 01/22/19   Vivi Barrack, MD  Continuous Blood Gluc Receiver (FREESTYLE LIBRE READER) DEVI 1 Device by Does not apply route daily as needed (to check blood sugar). 08/14/18   Vivi Barrack, MD  Continuous Blood Gluc Sensor (FREESTYLE LIBRE SENSOR SYSTEM) MISC Apply 1 sensor to skin every 14 days. 08/14/18   Vivi Barrack, MD  Erenumab-aooe (AIMOVIG) 70 MG/ML SOAJ Inject 70 mg into the skin every 30 (thirty) days. 05/06/19   Pieter Partridge, DO  gabapentin (NEURONTIN) 600 MG tablet Take 1 tablet (600 mg total) by mouth 3 (three) times daily. 03/30/19   Vivi Barrack, MD  hydrOXYzine (ATARAX/VISTARIL) 50 MG tablet TAKE 0.5-2 TABLETS (25-100 MG TOTAL) BY MOUTH AT BEDTIME AS NEEDED FOR ANXIETY. 03/05/19   Vivi Barrack, MD  IRON-VITAMIN C PO Take by mouth.    [provider]  metFORMIN (GLUCOPHAGE) 1000 MG tablet Take 1 tablet (1,000 mg total) by mouth 2 (two) times daily with a meal. 01/28/19   Vivi Barrack, MD  montelukast (SINGULAIR) 10 MG tablet Take 10 mg by mouth at bedtime.    [provider]  ondansetron (ZOFRAN-ODT) 4 MG disintegrating tablet TAKE 1 TABLET BY MOUTH EVERY 8 HOURS AS NEEDED FOR NAUSEA AND VOMITING 01/22/19   Vivi Barrack, MD  pantoprazole (PROTONIX) 40 MG tablet TAKE 1 TABLET BY MOUTH EVERY DAY IN THE EVENING 06/09/19   Vivi Barrack, MD  promethazine (PHENERGAN) 25 MG tablet Take 1 tablet (25 mg total) by mouth every 6 (six) hours as needed for nausea or vomiting. 06/20/19   Matheu Ploeger A, PA-C  ramelteon (ROZEREM) 8 MG tablet Take 1 tablet (8 mg total) by mouth at bedtime. 02/19/19   Vivi Barrack, MD  REXULTI 0.5 MG TABS Take 1 tablet by mouth daily. 04/17/18   [provider]  rosuvastatin (CRESTOR) 40 MG tablet TAKE 1 TABLET BY MOUTH EVERY DAY 06/01/19   Vivi Barrack, MD  sitaGLIPtin (JANUVIA) 100 MG tablet Take 1 tablet (100 mg total) by mouth daily. 09/22/18   Vivi Barrack, MD  sucralfate  (CARAFATE) 1 GM/10ML suspension Take 10 mLs (1 g total) by mouth 4 (four) times daily -  with meals and at bedtime. 06/20/19   Lashelle Koy A, PA-C  SUMAtriptan (IMITREX) 50 MG tablet TAKE 1 TABLET EVERY 2 HOURS AS NEEDED FOR MIGRAINE. MAY REPEAT IN 2 HRS IF HEADACHE PERSISTS/RECURS. 03/30/19   Vivi Barrack, MD  SUMAtriptan (TOSYMRA) 10 MG/ACT SOLN Place 1 spray into the nose every hour as needed (Maximum 3 sprays in 24 hours.). 05/06/19   Pieter Partridge, DO  tiZANidine (ZANAFLEX) 4 MG capsule Take 1 capsule (4 mg total) by mouth 3 (three) times daily.  04/28/19   Vivi Barrack, MD  tiZANidine (ZANAFLEX) 4 MG tablet TAKE ONE TABLET BY MOUTH THREE TIMES DAILY  06/18/19   Vivi Barrack, MD  Vitamin D, Ergocalciferol, (DRISDOL) 1.25 MG (50000 UNIT) CAPS capsule TAKE 1 CAPSULE BY MOUTH EVERY 7 DAYS 05/28/19   Lyndal Pulley, DO  vortioxetine HBr (TRINTELLIX) 10 MG TABS tablet Take 10 mg by mouth daily.    [provider]  zolpidem (AMBIEN) 5 MG tablet Take 1 tablet (5 mg total) by mouth at bedtime as needed for sleep. 03/12/19   Vivi Barrack, MD    Allergies    Ciprofloxacin  Review of Systems   Review of Systems  Constitutional: Negative.   HENT: Negative.   Respiratory: Negative.   Cardiovascular: Negative.   Gastrointestinal: Positive for abdominal pain, nausea and vomiting. Negative for abdominal distention, anal bleeding, blood in stool, constipation, diarrhea and rectal pain.  Genitourinary: Negative.   Musculoskeletal: Negative.   Skin: Negative.   Neurological: Negative.   All other systems reviewed and are negative.   Physical Exam Updated Vital Signs BP (!) 132/100   Pulse 70   Temp 97.8 F (36.6 C) (Oral)   Resp 15   Ht _0  (1.6 m)   Wt 90.7 kg   LMP 06/06/2019   SpO2 100%   BMI 35.43 kg/m   Physical Exam Vitals and nursing note reviewed.  Constitutional:      General: She is not in acute distress.    Appearance: She is well-developed. She is not  ill-appearing, toxic-appearing or diaphoretic.  HENT:     Head: Normocephalic and atraumatic.     Mouth/Throat:     Mouth: Mucous membranes are moist.     Pharynx: Oropharynx is clear.  Eyes:     Pupils: Pupils are equal, round, and reactive to light.  Cardiovascular:     Rate and Rhythm: Normal rate.     Heart sounds: Normal heart sounds.  Pulmonary:     Effort: Pulmonary effort is normal. No respiratory distress.     Breath sounds: Normal breath sounds.  Abdominal:     General: Bowel sounds are normal. There is no distension.     Palpations: Abdomen is soft.     Tenderness: There is abdominal tenderness in the epigastric area. There is no right CVA tenderness, left CVA tenderness, guarding or rebound. Negative signs include Murphy's sign and McBurney's sign.     Hernia: No hernia is present.     Comments: Generalized tenderness to epigastric region.  Negative Murphy sign.  No rebound or guarding.  Old incision scars without drainage, bleeding.  No overlying infectious skin changes to abdominal wall  Musculoskeletal:        General: Normal range of motion.     Cervical back: Normal range of motion.     Comments: Moves all 4 extremities without difficulty  Skin:    General: Skin is warm and dry.     Capillary Refill: Capillary refill takes less than 2 seconds.     Comments: Brisk capillary refill.  No edema, erythema or warmth.  Neurological:     Mental Status: She is alert.     Comments: Cranial nerves II through XII grossly intact.  Ambulatory in room without difficulty     ED Results / Procedures / Treatments   Labs (all labs ordered are listed, but only abnormal results are displayed) Labs Reviewed  COMPREHENSIVE METABOLIC PANEL - Abnormal; Notable for the following components:  Result Value   Glucose, Bld 116 (*)    All other components within normal limits  LIPASE, BLOOD  CBC  URINALYSIS, ROUTINE W REFLEX MICROSCOPIC  I-STAT BETA HCG BLOOD, ED ([facility], [facility], [facility] ONLY)     EKG None  Radiology CT ABDOMEN PELVIS W CONTRAST  Result Date: 06/20/2019 CLINICAL DATA:  Abdominal pain EXAM: CT ABDOMEN AND PELVIS WITH CONTRAST TECHNIQUE: Multidetector CT imaging of the abdomen and pelvis was performed using the standard protocol following bolus administration of intravenous contrast. CONTRAST:  130m OMNIPAQUE IOHEXOL 300 MG/ML  SOLN COMPARISON:  Abdominal ultrasound dated 06/20/2019 FINDINGS: Lower chest: No acute abnormality. Hepatobiliary: No focal liver abnormality is seen. No gallstones, gallbladder wall thickening, or biliary dilatation. Pancreas: Unremarkable. No pancreatic ductal dilatation or surrounding inflammatory changes. Spleen: Normal in size without focal abnormality. Adrenals/Urinary Tract: Adrenal glands are unremarkable. Other than a 2.7 cm cyst in the right kidney, the kidneys are normal, without renal calculi, focal lesion, or hydronephrosis. Bladder is unremarkable. Stomach/Bowel: The patient is status post gastric bypass surgery. Appendix appears normal. No evidence of bowel wall thickening, distention, or inflammatory changes. Vascular/Lymphatic: No significant vascular findings are present. No enlarged abdominal or pelvic lymph nodes. Reproductive: Uterus and bilateral adnexa are unremarkable. Other: There is a ventral midline abdominal wall hernia containing a loop of small bowel. There is no evidence of obstruction or strangulation. There is no free intraperitoneal fluid. Musculoskeletal: The patient is status post a left hip arthroplasty. IMPRESSION: 1. No acute process in the abdomen or pelvis. No findings to explain the patient's symptoms. Electronically Signed   By: TZerita BoersM.D.   On: 06/20/2019 18:59   [place]domen Limited RUQ  Result Date: 06/20/2019 CLINICAL DATA:  Epigastric pain, nausea vomiting. EXAM: ULTRASOUND ABDOMEN LIMITED RIGHT UPPER QUADRANT COMPARISON:  None. FINDINGS: Gallbladder: No gallstones or wall thickening visualized. No  sonographic Murphy sign noted by sonographer. Common bile duct: Diameter: 5.5 mm Liver: No focal lesion identified. Within normal limits in parenchymal echogenicity. Portal vein is patent on color Doppler imaging with normal direction of blood flow towards the liver. Other: Benign-appearing right renal cyst measures 2.4 cm in diameter. IMPRESSION: Normal appearance of the liver and gallbladder. Benign-appearing 2.4 cm right renal cyst. Electronically Signed   By: DFidela SalisburyM.D.   On: 06/20/2019 18:37    Procedures Procedures (including critical care time)  Medications Ordered in ED Medications  sodium chloride flush (NS) 0.9 % injection 3 mL (3 mLs Intravenous Given 06/20/19 1653)  sodium chloride 0.9 % bolus 1,000 mL (0 mLs Intravenous Stopped 06/20/19 1909)  morphine 4 MG/ML injection 4 mg (4 mg Intravenous Given 06/20/19 1652)  promethazine (PHENERGAN) injection 12.5 mg (12.5 mg Intravenous Given 06/20/19 1652)  HYDROmorphone (DILAUDID) injection 1 mg (1 mg Intravenous Given 06/20/19 1910)  promethazine (PHENERGAN) injection 12.5 mg (12.5 mg Intravenous Given 06/20/19 1910)  iohexol (OMNIPAQUE) 300 MG/ML solution 100 mL (100 mLs Intravenous Contrast Given 06/20/19 1839)  alum & mag hydroxide-simeth (MAALOX/MYLANTA) 200-200-20 MG/5ML suspension 30 mL (30 mLs Oral Given 06/20/19 2031)    And  lidocaine (XYLOCAINE) 2 % viscous mouth solution 15 mL (15 mLs Oral Given 06/20/19 2031)    ED Course  I have reviewed the triage vital signs and the nursing notes.  Pertinent labs & imaging results that were available during my care of the patient were reviewed by me and considered in my medical decision making (see chart for details).  44year old presents for evaluation of  abdominal pain x5 days.  She is afebrile, nonseptic, not ill-appearing.  History of gastric bypass.  No alcohol, NSAID use.  Multiple episodes of NBNB emesis.  Had a "normal" bowel movement 3 days ago.  Typically only has bowel movement  every 4 days.  No melena or bright red blood per rectum.  Has been taking Zofran without relief.  Negative Murphy sign however tenderness epigastric region.  No overlying infectious changes to abdominal wall.  No gross hernia.  Denies concerns for STDs, pelvic pain, vaginal discharge.  No chest pain, shortness of breath.  No tachycardia, tachypnea or hypoxia.  Low suspicion for atypical PE, dissection.  Plan on labs, imaging and reassess  Labs and imaging personally reviewed and interpreted CBC without leukocytosis, Hgb 13.3 CMP with mild hyperglycemia at 116 however no additional electrolyte, renal abnormality, anion gap 9, normal bicarb low suspicion for DKA Pregnancy test negative Lipase 34 EKG NSR, No STEMI  1830: Patient reassessed.Pain improved however still requesting additional pain medicine antiemetics.  Ultrasound CT pending.  2100: Patient reassessed.  Improvement in symptoms.  Ultrasound and CT scan reassuring.  Symptoms improved with GI cocktail.  Will p.o. challenge  2200: Abdominal pain with improvement with GI cocktail.  Tolerating p.o. intake.  Question reflux versus PUD.  Will DC home with Carafate, Phenergan and have her follow-up with PCP versus GI.  Low suspicion for acute atypical ACS, PE, dissection.  Patient is nontoxic, nonseptic appearing, in no apparent distress.  Patient's pain and other symptoms adequately managed in emergency department.  Fluid bolus given.  Labs, imaging and vitals reviewed.  Patient does not meet the SIRS or Sepsis criteria.  On repeat exam patient does not have a surgical abdomin and there are no peritoneal signs.  No indication of appendicitis, bowel obstruction, bowel perforation, cholecystitis, diverticulitis, PID or ectopic pregnancy.    The patient has been appropriately medically screened and/or stabilized in the ED. I have low suspicion for any other emergent medical condition which would require further screening, evaluation or treatment in  the ED or require inpatient management.  Patient is hemodynamically stable and in no acute distress.  Patient able to ambulate in department prior to ED.  Evaluation does not show acute pathology that would require ongoing or additional emergent interventions while in the emergency department or further inpatient treatment.  I have discussed the diagnosis with the patient and answered all questions.  Pain is been managed while in the emergency department and patient has no further complaints prior to discharge.  Patient is comfortable with plan discussed in room and is stable for discharge at this time.  I have discussed strict return precautions for returning to the emergency department.  Patient was encouraged to follow-up with PCP/specialist refer to at discharge.    MDM Rules/Calculators/A&P                       Final Clinical Impression(s) / ED Diagnoses Final diagnoses:  Epigastric pain  Non-intractable vomiting with nausea, unspecified vomiting type    Rx / DC Orders ED Discharge Orders         Ordered    sucralfate (CARAFATE) 1 GM/10ML suspension  3 times daily with meals & bedtime     06/20/19 2158    promethazine (PHENERGAN) 25 MG tablet  Every 6 hours PRN     06/20/19 2158           Amandine Covino A, PA-C 06/20/19 2204  Charlesetta Shanks, MD 06/21/19 [phone]

## 2019-06-22 ENCOUNTER — Other Ambulatory Visit: Payer: Self-pay | Admitting: Family Medicine

## 2019-07-09 DIAGNOSIS — F4310 Post-traumatic stress disorder, unspecified: Secondary | ICD-10-CM | POA: Diagnosis not present

## 2019-07-15 ENCOUNTER — Other Ambulatory Visit: Payer: Self-pay | Admitting: Family Medicine

## 2019-07-20 ENCOUNTER — Other Ambulatory Visit: Payer: Self-pay

## 2019-07-20 ENCOUNTER — Ambulatory Visit (INDEPENDENT_AMBULATORY_CARE_PROVIDER_SITE_OTHER): Payer: BC Managed Care – PPO | Admitting: Family Medicine

## 2019-07-20 ENCOUNTER — Encounter: Payer: Self-pay | Admitting: Family Medicine

## 2019-07-20 VITALS — BP 130/80 | HR 78 | Temp 97.9°F | Wt 198.0 lb

## 2019-07-20 DIAGNOSIS — F325 Major depressive disorder, single episode, in full remission: Secondary | ICD-10-CM

## 2019-07-20 DIAGNOSIS — J329 Chronic sinusitis, unspecified: Secondary | ICD-10-CM

## 2019-07-20 DIAGNOSIS — E1169 Type 2 diabetes mellitus with other specified complication: Secondary | ICD-10-CM | POA: Diagnosis not present

## 2019-07-20 DIAGNOSIS — G43709 Chronic migraine without aura, not intractable, without status migrainosus: Secondary | ICD-10-CM

## 2019-07-20 DIAGNOSIS — G4700 Insomnia, unspecified: Secondary | ICD-10-CM

## 2019-07-20 DIAGNOSIS — J4520 Mild intermittent asthma, uncomplicated: Secondary | ICD-10-CM

## 2019-07-20 DIAGNOSIS — M5442 Lumbago with sciatica, left side: Secondary | ICD-10-CM

## 2019-07-20 DIAGNOSIS — J309 Allergic rhinitis, unspecified: Secondary | ICD-10-CM

## 2019-07-20 DIAGNOSIS — F4310 Post-traumatic stress disorder, unspecified: Secondary | ICD-10-CM | POA: Diagnosis not present

## 2019-07-20 DIAGNOSIS — G8929 Other chronic pain: Secondary | ICD-10-CM

## 2019-07-20 DIAGNOSIS — F419 Anxiety disorder, unspecified: Secondary | ICD-10-CM

## 2019-07-20 LAB — POCT GLYCOSYLATED HEMOGLOBIN (HGB A1C): Hemoglobin A1C: 6.9 % — AB (ref 4.0–5.6)

## 2019-07-20 MED ORDER — ZOLPIDEM TARTRATE 10 MG PO TABS
10.0000 mg | ORAL_TABLET | Freq: Every evening | ORAL | 5 refills | Status: DC | PRN
Start: 2019-07-20 — End: 2019-10-23

## 2019-07-20 MED ORDER — TIZANIDINE HCL 4 MG PO TABS: 4.0000 mg | ORAL_TABLET | Freq: Three times a day (TID) | ORAL | 5 refills | Status: DC

## 2019-07-20 MED ORDER — METFORMIN HCL 1000 MG PO TABS
1000.0000 mg | ORAL_TABLET | Freq: Every day | ORAL | 3 refills | Status: DC
Start: 2019-07-20 — End: 2019-12-31

## 2019-07-20 MED ORDER — MELOXICAM 15 MG PO TABS: 15.0000 mg | ORAL_TABLET | Freq: Every day | ORAL | 5 refills | Status: DC

## 2019-07-20 MED ORDER — IPRATROPIUM BROMIDE 0.06 % NA SOLN: 2.0000 | Freq: Four times a day (QID) | NASAL | 12 refills | Status: DC

## 2019-07-20 MED ORDER — ONDANSETRON 4 MG PO TBDP: ORAL_TABLET | ORAL | 1 refills | Status: DC

## 2019-07-20 MED ORDER — ALBUTEROL SULFATE HFA 108 (90 BASE) MCG/ACT IN AERS: 2.0000 | INHALATION_SPRAY | Freq: Four times a day (QID) | RESPIRATORY_TRACT | 2 refills | Status: DC | PRN

## 2019-07-20 MED ORDER — AMOXICILLIN-POT CLAVULANATE 875-125 MG PO TABS: 1.0000 | ORAL_TABLET | Freq: Two times a day (BID) | ORAL | 0 refills | Status: DC

## 2019-07-20 NOTE — Assessment & Plan Note (Signed)
Slightly worsened.  Will start Mobic 15 mg daily as needed.  She will follow-up with orthopedics.

## 2019-07-20 NOTE — Assessment & Plan Note (Signed)
Continue management per psychiatry. 

## 2019-07-20 NOTE — Assessment & Plan Note (Signed)
-   Albuterol refilled.

## 2019-07-20 NOTE — Progress Notes (Signed)
   Heidi Holland is a 44 y.o. female who presents today for an office visit.  Assessment/Plan:  New/Acute Problems: Sinusitis Given the symptoms of been going on for almost 2 months this point will start course of Augmentin.  She will also start Atrovent nasal spray.  She will continue indications for allergic rhinitis including Singulair and cetirizine.  Chronic Problems Addressed Today: Insomnia Increase Ambien to 10 mg daily.  Follow-up 6 months.  Depression, major, single episode, complete remission (HCC) Continue management per psychiatry.  Anxiety Continue management per psychiatry.  Migraine Continue management per neurology.  Continue Aimovig and Imitrex.  Mild intermittent asthma, uncomplicated Albuterol refilled.  Allergic rhinitis Did not tolerate Astelin.  Will start Atrovent.  T2DM (type 2 diabetes mellitus) (HCC) A1c at goal at 6.9.  Continue Metformin 1000 mg daily and Januvia 100 mg daily.  Low back pain Slightly worsened.  Will start Mobic 15 mg daily as needed.  She will follow-up with orthopedics.     Subjective:  HPI:  She has been having sinus congestion for the past few months. She has been having more post nasal drip. Has tried nasal spray which has not worked well.        Objective:  Physical Exam: BP 130/80   Pulse 78   Temp 97.9 F (36.6 C)   Wt 198 lb (89.8 kg)   SpO2 99%   BMI 35.07 kg/m   Gen: No acute distress, resting comfortably CV: Regular rate and rhythm with no murmurs appreciated Pulm: Normal work of breathing, clear to auscultation bilaterally with no crackles, wheezes, or rhonchi Neuro: Grossly normal, moves all extremities Psych: Normal affect and thought content      Heidi Holland M. [name], MD 07/20/2019 8:35 AM

## 2019-07-20 NOTE — Assessment & Plan Note (Signed)
A1c at goal at 6.9.  Continue Metformin 1000 mg daily and Januvia 100 mg daily.

## 2019-07-20 NOTE — Assessment & Plan Note (Signed)
Continue management per neurology.  Continue Aimovig and Imitrex.

## 2019-07-20 NOTE — Assessment & Plan Note (Signed)
Increase Ambien to 10 mg daily.  Follow-up 6 months.

## 2019-07-20 NOTE — Assessment & Plan Note (Signed)
Did not tolerate Astelin.  Will start Atrovent.

## 2019-07-20 NOTE — Patient Instructions (Signed)
It was very nice to see you today!  I will send refills in to your pharmacy.  Your blood sugar looks okay today.  Please continue working on diet and exercise.  Please come back to see me in 6 months, or sooner if needed.  Take care, Dr [name]  Please try these tips to maintain a healthy lifestyle:   Eat at least 3 REAL meals and 1-2 snacks per day.  Aim for no more than 5 hours between eating.  If you eat breakfast, please do so within one hour of getting up.    Each meal should contain half fruits/vegetables, one quarter protein, and one quarter carbs (no bigger than a computer mouse)   Cut down on sweet beverages. This includes juice, soda, and sweet tea.     Drink at least 1 glass of water with each meal and aim for at least 8 glasses per day   Exercise at least 150 minutes every week.

## 2019-07-23 ENCOUNTER — Encounter: Payer: Self-pay | Admitting: Family Medicine

## 2019-07-23 ENCOUNTER — Telehealth: Payer: Self-pay | Admitting: Family Medicine

## 2019-07-23 NOTE — Telephone Encounter (Signed)
Patient is calling in asking for a work note for the appointment on Monday, states she has been out all week and is hoping to return tomorrow. Asked for a returned call if any questions.

## 2019-07-23 NOTE — Telephone Encounter (Signed)
Note sent via mychart

## 2019-07-24 ENCOUNTER — Telehealth: Payer: Self-pay | Admitting: Family Medicine

## 2019-07-24 NOTE — Telephone Encounter (Signed)
[employer] calling to get some information about what all the patient has been diagnosed with - I was not comfortable answering these questions due to HIPAA - [phone] Hosie Poisson # [id number]

## 2019-07-27 DIAGNOSIS — F4310 Post-traumatic stress disorder, unspecified: Secondary | ICD-10-CM | POA: Diagnosis not present

## 2019-07-27 NOTE — Telephone Encounter (Signed)
Left voice message for patient to call clinic.  

## 2019-07-28 DIAGNOSIS — F4310 Post-traumatic stress disorder, unspecified: Secondary | ICD-10-CM | POA: Diagnosis not present

## 2019-07-28 NOTE — Telephone Encounter (Signed)
William from [employer] is calling back to speak to the CMA

## 2019-07-28 NOTE — Telephone Encounter (Signed)
Left voice message for patient to call clinic.  

## 2019-07-30 ENCOUNTER — Other Ambulatory Visit: Payer: Self-pay | Admitting: Neurology

## 2019-07-30 ENCOUNTER — Other Ambulatory Visit: Payer: Self-pay | Admitting: Family Medicine

## 2019-07-30 NOTE — Telephone Encounter (Signed)
Spoke with [employer] informed them I have the forms that's needed,waiting to spoke with patient in order to complete forms.

## 2019-07-30 NOTE — Telephone Encounter (Signed)
Left voice message for patient to call clinic.  

## 2019-08-04 ENCOUNTER — Telehealth: Payer: Self-pay | Admitting: Neurology

## 2019-08-04 ENCOUNTER — Institutional Professional Consult (permissible substitution): Payer: Self-pay | Admitting: Neurology

## 2019-08-04 NOTE — Progress Notes (Signed)
Virtual Visit via Video Note The purpose of this virtual visit is to provide medical care while limiting exposure to the novel coronavirus.    Consent was obtained for video visit:  Yes.   Answered questions that patient had about telehealth interaction:  Yes.   I discussed the limitations, risks, security and privacy concerns of performing an evaluation and management service by telemedicine. I also discussed with the patient that there may be a patient responsible charge related to this service. The patient expressed understanding and agreed to proceed.  Pt location: Home Physician Location: office Name of referring provider:  Vivi Barrack, MD I connected with Heidi Holland at patients initiation/request on 08/06/2019 at 10:50 AM EDT by video enabled telemedicine application and verified that I am speaking with the correct person using two identifiers. Pt MRN:  [id number] Pt DOB:  17-Feb-1975 Video Participants:  Heidi Holland   History of Present Illness:  Heidi Holland is a 44 year old right-handed Caucasian woman with hypertension, hyperlipidemia, type 2 diabetes, PTSD/anxiety/depression, chronic low back pain and history of TIA who follows up for migraines.  UPDATE: Last seen for initial consultation in March 2020.  At that time, advised to try Aimovig.  It helps for 2 weeks out of the month.  The week prior and week after the injection, she would have 8 migraine days.  The next two weeks, she would have a couple of more.  Tosymra aborts headache quickly but the headache may start to return by the end of the day and is severe the next morning.  Total of 20 headache days in a month.  Migraines are severe.  Current NSAIDS:  ASA 328m Current analgesics:  none Current triptans:  Tosymra Current ergotamine:  none Current anti-emetic:  none Current muscle relaxants:  Tizanidine 424mPRN Current anti-anxiolytic:  Clonazepam, hydroxyzine Current sleep aide:  trazodone Current  Antihypertensive medications:  hydroxyzine Current Antidepressant medications:  Trintellix, Rexulti Current Anticonvulsant medications:  Gabapentin 60020mhree times daily Current anti-CGRP: Aimovig 58m37mrrent Vitamins/Herbal/Supplements:  none Current Antihistamines/Decongestants:  none Other therapy:  none Hormone/birth control:  none Other medications:  Seroquel  Caffeine:  Caffeine-free soda, 1 cup of coffee 2-3 times a week. Alcohol:  no Smoker:  no Diet:  Needs to improve water intake.  Skips meals.  Diet Coke daily Exercise:  Not routine Depression:  Poor.  Currently treated; Anxiety:  Poor.  Currently treated. Other pain:  Low back pain/lumbar radiculopathy Sleep hygiene:  Improved on medication  HISTORY: Onset:  Age 8 L69ation: occiput into neck or top of head.   Quality:  Pressure, throbbing Initial intensity:  8/10.  She denies new headache, thunderclap headache  Aura:  no Premonitory Phase:  Wakes up with heavy eyelids and stiff neck. Postdrome:  "hangover effect" - exhausted, irritable, neck soreness Associated symptoms:  Neck stiffness, nausea, sometimes vomiting, photophobia, phonophobia, blurred vision.  She denies associated unilateral numbness or weakness. Initial duration:  Usually 2-3 days (up to a week with various intensity) Initial frequency:  6 times a month (15 days or more a month) Initial frequency of abortive medication: something daily Triggers:  Lack of coffee, menstrual period, when low back pain aggravated, perfumes Relieving factors:  Ice pack, lay in dark Activity:  aggravates She has been to the ED on a couple of occasions for severe intractable migraine.  Past NSAIDS:  Ibuprofen, Aleve Past analgesics:  Excedrin, Tylenol, Fioricet Past abortive triptans:   rizatriptan, sumatriptan 50mg49mt abortive ergotamine:  none Past muscle relaxants:  Flexeril, Robaxin Past anti-emetic:  Zofran, Phenergan Past antihypertensive medications:  "a  blood pressure medication" Past antidepressant medications:  Effexor Past anticonvulsant medications:  topiramate Past anti-CGRP:  None Past vitamins/Herbal/Supplements:  none Past antihistamines/decongestants:  none Other past therapies:  Botox (effective)  She was told by her previous PCP that she had a transient ischemic attack in early 2019.  She was walking and her legs suddenly gave out and her arms weren't working.  She was on the floor for a few minutes.  No unilateral numbness or weakness.  When she got up, she called a friend who tested her on the phone for TIA and symptoms resolved.  She had an MRI and MRA of the brain about a month later, on 07/24/17, which report states were normal.  She was given a diagnosis of TIA and has since been on ASA 351m daily.  Her mother has a history of recurrent TIAs while she was treated for lung cancer.   Family history of headache:  no  Past Medical History: Past Medical History:  Diagnosis Date  . Allergy   . Anxiety   . Asthma   . Depression   . [facility] [place] [facility] [place]   . Diverticulitis   . GERD (gastroesophageal reflux disease)   . HPV in female   . Hyperlipidemia   . Migraines     Medications: Outpatient Encounter Medications as of 08/06/2019  Medication Sig  . acetaminophen (TYLENOL) 325 MG tablet Take 650 mg by mouth every 6 (six) hours as needed for mild pain.  .Marland Kitchenalbuterol (VENTOLIN HFA) 108 (90 Base) MCG/ACT inhaler Inhale 2 puffs into the lungs every 6 (six) hours as needed for wheezing or shortness of breath.  .Marland Kitchenamoxicillin-clavulanate (AUGMENTIN) 875-125 MG tablet Take 1 tablet by mouth 2 (two) times daily.  . blood glucose meter kit and supplies KIT Dispense based on patient and insurance preference. Use up to four times daily as directed. (FOR ICD-9 250.00, 250.01).  . cetirizine (ZYRTEC) 10 MG tablet Take 1 tablet (10 mg total) by mouth daily.  . clonazePAM (KLONOPIN) 0.5 MG tablet Take 1 tablet  (0.5 mg total) by mouth daily.  . Continuous Blood Gluc Receiver (FREESTYLE LIBRE READER) DEVI 1 Device by Does not apply route daily as needed (to check blood sugar).  . Continuous Blood Gluc Sensor (FREESTYLE LIBRE SENSOR SYSTEM) MISC Apply 1 sensor to skin every 14 days.  .Eduard Roux(AIMOVIG) 70 MG/ML SOAJ Inject 70 mg into the skin every 30 (thirty) days.  .Marland Kitchengabapentin (NEURONTIN) 600 MG tablet Take 1 tablet (600 mg total) by mouth 3 (three) times daily.  . hydrOXYzine (ATARAX/VISTARIL) 50 MG tablet TAKE 0.5-2 TABLETS (25-100 MG TOTAL) BY MOUTH AT BEDTIME AS NEEDED FOR ANXIETY.  .Marland Kitchenipratropium (ATROVENT) 0.06 % nasal spray Place 2 sprays into both nostrils 4 (four) times daily.  .Marland KitchenIRON-VITAMIN C PO Take by mouth.  . lamoTRIgine (LAMICTAL) 150 MG tablet Take 150 mg by mouth daily.  . meloxicam (MOBIC) 15 MG tablet Take 1 tablet (15 mg total) by mouth daily.  . metFORMIN (GLUCOPHAGE) 1000 MG tablet Take 1 tablet (1,000 mg total) by mouth daily with breakfast.  . montelukast (SINGULAIR) 10 MG tablet Take 10 mg by mouth at bedtime.  . ondansetron (ZOFRAN-ODT) 4 MG disintegrating tablet TAKE 1 TABLET BY MOUTH EVERY 8 HOURS AS NEEDED FOR NAUSEA AND VOMITING  . pantoprazole (PROTONIX) 40 MG tablet TAKE 1 TABLET BY MOUTH EVERY DAY  IN THE EVENING  . QUEtiapine (SEROQUEL) 50 MG tablet Take 50 mg by mouth at bedtime.  . ramelteon (ROZEREM) 8 MG tablet Take 1 tablet (8 mg total) by mouth at bedtime.  Marland Kitchen REXULTI 0.5 MG TABS Take 1 tablet by mouth daily.  . rosuvastatin (CRESTOR) 40 MG tablet TAKE 1 TABLET BY MOUTH EVERY DAY  . sitaGLIPtin (JANUVIA) 100 MG tablet Take 1 tablet (100 mg total) by mouth daily.  . sucralfate (CARAFATE) 1 GM/10ML suspension Take 10 mLs (1 g total) by mouth 4 (four) times daily -  with meals and at bedtime.  . SUMAtriptan (IMITREX) 50 MG tablet TAKE 1 TABLET EVERY 2 HOURS AS NEEDED FOR MIGRAINE. MAY REPEAT IN 2 HRS IF HEADACHE PERSISTS/RECURS.  . SUMAtriptan (TOSYMRA) 10  MG/ACT SOLN Place 1 spray into the nose every hour as needed (Maximum 3 sprays in 24 hours.).  Marland Kitchen tiZANidine (ZANAFLEX) 4 MG capsule Take 1 capsule (4 mg total) by mouth 3 (three) times daily.  Marland Kitchen tiZANidine (ZANAFLEX) 4 MG tablet Take 1 tablet (4 mg total) by mouth 3 (three) times daily.  . Vitamin D, Ergocalciferol, (DRISDOL) 1.25 MG (50000 UNIT) CAPS capsule TAKE 1 CAPSULE BY MOUTH EVERY 7 DAYS  . vortioxetine HBr (TRINTELLIX) 10 MG TABS tablet Take 10 mg by mouth daily.  Marland Kitchen zolpidem (AMBIEN) 10 MG tablet Take 1 tablet (10 mg total) by mouth at bedtime as needed for sleep.   No facility-administered encounter medications on file as of 08/06/2019.    Allergies: Allergies  Allergen Reactions  . Ciprofloxacin Itching    Family History: Family History  Problem Relation Age of Onset  . Heart disease Mother   . Lung cancer Mother   . Rheum arthritis Mother   . Arthritis Mother   . Cancer Mother   . COPD Mother   . Miscarriages / [place] Mother   . Diabetes Father   . Heart disease Father   . Breast cancer Paternal Aunt     Social History: Social History   Socioeconomic History  . Marital status: Single    Spouse name: Not on file  . Number of children: 0  . Years of education: 30  . Highest education level: Associate degree: occupational, Hotel manager, or vocational program  Occupational History    Employer: [employer]  Tobacco Use  . Smoking status: Never Smoker  . Smokeless tobacco: Never Used  Vaping Use  . Vaping Use: Never used  Substance and Sexual Activity  . Alcohol use: Yes    Comment: Occ  . Drug use: Yes    Types: Marijuana    Comment: THC  . Sexual activity: Never  Other Topics Concern  . Not on file  Social History Narrative   Right handed.    Social Determinants of Health   Financial Resource Strain:   . Difficulty of Paying Living Expenses:   Food Insecurity:   . Worried About Charity fundraiser in the Last Year:   . Arboriculturist in the Last  Year:   Transportation Needs:   . Film/video editor (Medical):   Marland Kitchen Lack of Transportation (Non-Medical):   Physical Activity:   . Days of Exercise per Week:   . Minutes of Exercise per Session:   Stress:   . Feeling of Stress :   Social Connections:   . Frequency of Communication with Friends and Family:   . Frequency of Social Gatherings with Friends and Family:   . Attends Religious Services:   .  Active Member of Clubs or Organizations:   . Attends Archivist Meetings:   Marland Kitchen Marital Status:   Intimate Partner Violence:   . Fear of Current or Ex-Partner:   . Emotionally Abused:   Marland Kitchen Physically Abused:   . Sexually Abused:     Observations/Objective:   There were no vitals taken for this visit. No acute distress.  Alert and oriented.  Speech fluent and not dysarthric.  Language intact.  Eyes orthophoric on primary gaze.  Face symmetric.  Assessment and Plan:   Chronic migraine without aura, without status migrainosus, not intractable.  She continues to have at least 15 headache days a month on Aimovig.  She has failed topiramate and venlafaxine as well.  She previously did well on Botox, so we will restart Botox.  1.  For preventative management, Botox 2.  For abortive therapy, she will continue Tosymra but advised to repeat dose later in day at earliest onset of the migraine. 3.  Limit use of pain relievers to no more than 2 days out of week to prevent risk of rebound or medication-overuse headache. 4.  Keep headache diary 5.  Exercise, hydration, caffeine cessation, sleep hygiene, monitor for and avoid triggers 6.  Consider:  magnesium citrate 444m daily, riboflavin 4049mdaily, and coenzyme Q10 10069mhree times daily 7. Follow up for Botox   Follow Up Instructions:    -I discussed the assessment and treatment plan with the patient. The patient was provided an opportunity to ask questions and all were answered. The patient agreed with the plan and demonstrated  an understanding of the instructions.   The patient was advised to call back or seek an in-person evaluation if the symptoms worsen or if the condition fails to improve as anticipated.    AdaDudley MajorO

## 2019-08-04 NOTE — Telephone Encounter (Signed)
PT was a no show for today initial sleep consult visit.

## 2019-08-05 ENCOUNTER — Encounter: Payer: Self-pay | Admitting: Neurology

## 2019-08-06 ENCOUNTER — Telehealth (INDEPENDENT_AMBULATORY_CARE_PROVIDER_SITE_OTHER): Payer: BC Managed Care – PPO | Admitting: Neurology

## 2019-08-06 ENCOUNTER — Encounter: Payer: Self-pay | Admitting: Neurology

## 2019-08-06 ENCOUNTER — Other Ambulatory Visit: Payer: Self-pay

## 2019-08-06 DIAGNOSIS — G43709 Chronic migraine without aura, not intractable, without status migrainosus: Secondary | ICD-10-CM | POA: Diagnosis not present

## 2019-08-09 ENCOUNTER — Other Ambulatory Visit: Payer: Self-pay | Admitting: Neurology

## 2019-08-10 ENCOUNTER — Other Ambulatory Visit: Payer: Self-pay | Admitting: Family Medicine

## 2019-08-10 NOTE — Telephone Encounter (Signed)
Pt requesting refill has an appointment tomorrow.

## 2019-08-11 ENCOUNTER — Telehealth (INDEPENDENT_AMBULATORY_CARE_PROVIDER_SITE_OTHER): Payer: BC Managed Care – PPO | Admitting: Family Medicine

## 2019-08-11 DIAGNOSIS — F419 Anxiety disorder, unspecified: Secondary | ICD-10-CM

## 2019-08-11 DIAGNOSIS — F325 Major depressive disorder, single episode, in full remission: Secondary | ICD-10-CM

## 2019-08-11 DIAGNOSIS — M5442 Lumbago with sciatica, left side: Secondary | ICD-10-CM | POA: Diagnosis not present

## 2019-08-11 DIAGNOSIS — G8929 Other chronic pain: Secondary | ICD-10-CM | POA: Diagnosis not present

## 2019-08-11 DIAGNOSIS — F4310 Post-traumatic stress disorder, unspecified: Secondary | ICD-10-CM | POA: Diagnosis not present

## 2019-08-11 MED ORDER — PREDNISONE 50 MG PO TABS
ORAL_TABLET | ORAL | 0 refills | Status: DC
Start: 2019-08-11 — End: 2019-09-08

## 2019-08-11 MED ORDER — CELECOXIB 100 MG PO CAPS: 100.0000 mg | ORAL_CAPSULE | Freq: Two times a day (BID) | ORAL | 1 refills | Status: DC

## 2019-08-11 MED ORDER — LEVOFLOXACIN 500 MG PO TABS
500.0000 mg | ORAL_TABLET | Freq: Every day | ORAL | 0 refills | Status: DC
Start: 2019-08-11 — End: 2019-09-08

## 2019-08-11 NOTE — Assessment & Plan Note (Signed)
No red flags.  We will switch from Mobic to Celebrex to see if this helps with some of her GI issues.  We will also start prednisone for sinusitis which should likely help with low back pain as well.  We will send another referral to see a different orthopedist per patient request.

## 2019-08-11 NOTE — Assessment & Plan Note (Signed)
Currently stable.  She will see therapist later this morning.

## 2019-08-11 NOTE — Progress Notes (Signed)
° °  Heidi Holland is a 44 y.o. female who presents today for a virtual office visit.  Assessment/Plan:  New/Acute Problems: Sinusitis Did not adequately respond to previous course of Augmentin and prednisone.  We will send in another course of prednisone and start Levaquin.  If no improvement would consider referral to ENT.  Chronic Problems Addressed Today: Depression, major, single episode, complete remission (HCC) Currently stable.  She will see therapist later this morning.  Anxiety Currently stable.  Will see therapist later this morning.  Low back pain No red flags.  We will switch from Mobic to Celebrex to see if this helps with some of her GI issues.  We will also start prednisone for sinusitis which should likely help with low back pain as well.  We will send another referral to see a different orthopedist per patient request.     Subjective:  HPI:  Patient last seen a few weeks ago.  She was treated for sinus infection at that time.  Symptoms have not improved since then.  She is also had worsening low back pain.  She thinks the meloxicam is also causing upset stomach.  She would like to change to an alternative medication.  She has had worsening depression and anxiety over the last week or so.  She will be seeing a therapist later this morning.       Objective/Observations  Physical Exam: Gen: NAD, resting comfortably Pulm: Normal work of breathing Neuro: Grossly normal, moves all extremities Psych: Normal affect and thought content  Virtual Visit via Video   I connected with Heidi Holland on 08/11/19 at  8:20 AM EDT by a video enabled telemedicine application and verified that I am speaking with the correct person using two identifiers. The limitations of evaluation and management by telemedicine and the availability of in person appointments were discussed. The patient expressed understanding and agreed to proceed.   Patient location: Home Provider location: [place]  [place] [employer] Persons participating in the virtual visit: Myself and Patient     [name]. [name], MD 08/11/2019 8:55 AM

## 2019-08-11 NOTE — Assessment & Plan Note (Signed)
Currently stable.  Will see therapist later this morning.

## 2019-08-13 DIAGNOSIS — F4310 Post-traumatic stress disorder, unspecified: Secondary | ICD-10-CM | POA: Diagnosis not present

## 2019-08-15 ENCOUNTER — Other Ambulatory Visit: Payer: Self-pay | Admitting: Family Medicine

## 2019-08-18 NOTE — Telephone Encounter (Signed)
Patient offered 4:30pm Phone call appt on Monday, July 12th. Needs to check schedule and will call back.

## 2019-08-21 DIAGNOSIS — F4310 Post-traumatic stress disorder, unspecified: Secondary | ICD-10-CM | POA: Diagnosis not present

## 2019-08-26 DIAGNOSIS — F4310 Post-traumatic stress disorder, unspecified: Secondary | ICD-10-CM | POA: Diagnosis not present

## 2019-08-27 ENCOUNTER — Other Ambulatory Visit: Payer: Self-pay | Admitting: Family Medicine

## 2019-08-27 ENCOUNTER — Other Ambulatory Visit: Payer: Self-pay | Admitting: Neurology

## 2019-08-27 DIAGNOSIS — F4310 Post-traumatic stress disorder, unspecified: Secondary | ICD-10-CM | POA: Diagnosis not present

## 2019-09-04 ENCOUNTER — Other Ambulatory Visit: Payer: Self-pay | Admitting: Family Medicine

## 2019-09-04 DIAGNOSIS — G43709 Chronic migraine without aura, not intractable, without status migrainosus: Secondary | ICD-10-CM

## 2019-09-08 ENCOUNTER — Encounter (HOSPITAL_COMMUNITY): Payer: Self-pay

## 2019-09-08 ENCOUNTER — Other Ambulatory Visit: Payer: Self-pay

## 2019-09-08 ENCOUNTER — Ambulatory Visit: Payer: BC Managed Care – PPO | Admitting: Orthopaedic Surgery

## 2019-09-08 ENCOUNTER — Ambulatory Visit (HOSPITAL_COMMUNITY)
Admission: EM | Admit: 2019-09-08 | Discharge: 2019-09-08 | Disposition: A | Discharge status: Home / Self Care | Payer: BC Managed Care – PPO | Attending: Urgent Care | Admitting: Urgent Care

## 2019-09-08 DIAGNOSIS — F419 Anxiety disorder, unspecified: Secondary | ICD-10-CM | POA: Diagnosis not present

## 2019-09-08 DIAGNOSIS — Z79899 Other long term (current) drug therapy: Secondary | ICD-10-CM | POA: Insufficient documentation

## 2019-09-08 DIAGNOSIS — Z7984 Long term (current) use of oral hypoglycemic drugs: Secondary | ICD-10-CM | POA: Diagnosis not present

## 2019-09-08 DIAGNOSIS — G4700 Insomnia, unspecified: Secondary | ICD-10-CM | POA: Diagnosis not present

## 2019-09-08 DIAGNOSIS — G43909 Migraine, unspecified, not intractable, without status migrainosus: Secondary | ICD-10-CM | POA: Insufficient documentation

## 2019-09-08 DIAGNOSIS — J069 Acute upper respiratory infection, unspecified: Secondary | ICD-10-CM | POA: Insufficient documentation

## 2019-09-08 DIAGNOSIS — Z20822 Contact with and (suspected) exposure to covid-19: Secondary | ICD-10-CM | POA: Insufficient documentation

## 2019-09-08 DIAGNOSIS — J4520 Mild intermittent asthma, uncomplicated: Secondary | ICD-10-CM | POA: Diagnosis not present

## 2019-09-08 DIAGNOSIS — K219 Gastro-esophageal reflux disease without esophagitis: Secondary | ICD-10-CM | POA: Diagnosis not present

## 2019-09-08 DIAGNOSIS — Z791 Long term (current) use of non-steroidal anti-inflammatories (NSAID): Secondary | ICD-10-CM | POA: Diagnosis not present

## 2019-09-08 DIAGNOSIS — E785 Hyperlipidemia, unspecified: Secondary | ICD-10-CM | POA: Insufficient documentation

## 2019-09-08 DIAGNOSIS — F329 Major depressive disorder, single episode, unspecified: Secondary | ICD-10-CM | POA: Diagnosis not present

## 2019-09-08 LAB — SARS CORONAVIRUS 2 (TAT 6-24 HRS): SARS Coronavirus 2: NEGATIVE

## 2019-09-08 MED ORDER — FLUTICASONE PROPIONATE 50 MCG/ACT NA SUSP
2.0000 | Freq: Every day | NASAL | 0 refills | Status: DC
Start: 2019-09-08 — End: 2020-07-26

## 2019-09-08 MED ORDER — SALINE SPRAY 0.65 % NA SOLN: 1.0000 | NASAL | 0 refills | Status: DC | PRN

## 2019-09-08 MED ORDER — GUAIFENESIN ER 600 MG PO TB12: 600.0000 mg | ORAL_TABLET | Freq: Two times a day (BID) | ORAL | 0 refills | Status: AC

## 2019-09-08 MED ORDER — CEPACOL SORE THROAT 5.4 MG MT LOZG
1.0000 | LOZENGE | OROMUCOSAL | 0 refills | Status: DC | PRN
Start: 2019-09-08 — End: 2019-09-09

## 2019-09-08 NOTE — Discharge Instructions (Signed)
Take medicines as prescribed -Flonase twice a day for 1 week -Mucinex twice a day for 1 week -Nasal saline spray throughout the day -Cepacol menthol lozenge for sore throat -Tylenol or ibuprofen over-the-counter for labeling as needed for sore throat or pain  If not improving over the next 5 to 7 days return or follow-up with your primary care  If significantly worsening with shortness of breath, high fevers, return to clinic or go to the emergency department  If your Covid-19 test is positive, you will receive a phone call from [facility] regarding your results. Negative test results are not called. Both positive and negative results area always visible on MyChart. If you do not have a MyChart account, sign up instructions are in your discharge papers.   Persons who are directed to care for themselves at home may discontinue isolation under the following conditions:   At least 10 days have passed since symptom onset and  At least 24 hours have passed without running a fever (this means without the use of fever-reducing medications) and  Other symptoms have improved.  Persons infected with COVID-19 who never develop symptoms may discontinue isolation and other precautions 10 days after the date of their first positive COVID-19 test.

## 2019-09-08 NOTE — ED Provider Notes (Signed)
[facility]    CSN: [id number] Arrival date & time: 09/08/19  0855      History   Chief Complaint Chief Complaint  Patient presents with  . Nasal Congestion    HPI Heidi Holland is a 44 y.o. female.   Patient presents with 4 days of nasal congestion, nonproductive cough and sore throat.  No fevers or chills.  Clear to yellow nasal discharge.  She does endorse some facial tenderness and pressure.  Denies significant headache.  No difficulty breathing, did use her inhaler last night if she has asthma.  Denies difficulty breathing today.  No chest pain.  No abdominal pain, nausea, diarrhea.  Does endorse a little bit of left ear pressure.     Past Medical History:  Diagnosis Date  . Allergy   . Anxiety   . Asthma   . Depression   . Diabetes mellitus without complication ([place])   . Diverticulitis   . GERD (gastroesophageal reflux disease)   . HPV in female   . Hyperlipidemia   . Migraines     Patient Active Problem List   Diagnosis Date Noted  . Status post left hip replacement 03/12/2019  . T2DM (type 2 diabetes mellitus) ([place]) 07/31/2018  . Dyslipidemia associated with type 2 diabetes mellitus ([place]) 07/31/2018  . Allergic rhinitis 07/31/2018  . Mild intermittent asthma, uncomplicated 16/08/3708  . GERD (gastroesophageal reflux disease) 07/31/2018  . S/P gastric bypass 07/31/2018  . Migraine 07/31/2018  . Anxiety 07/31/2018  . Depression, major, single episode, complete remission ([place]) 07/31/2018  . Insomnia 07/31/2018  . Umbilical hernia 62/69/4854  . Iron deficiency anemia 06/09/2018  . Cervicogenic headache 05/12/2018  . Low back pain 05/12/2018  . Polyarthralgia 05/12/2018    Past Surgical History:  Procedure Laterality Date  . BOWEL RESECTION    . gasticbypass    . GASTRIC BYPASS    . KNEE ARTHROSCOPY    . TOTAL HIP ARTHROPLASTY Left     OB History   No obstetric history on file.      Home Medications    Prior to Admission  medications   Medication Sig Start Date End Date Taking? Authorizing Provider  acetaminophen (TYLENOL) 325 MG tablet Take 650 mg by mouth every 6 (six) hours as needed for mild pain.    [provider]  albuterol (VENTOLIN HFA) 108 (90 Base) MCG/ACT inhaler Inhale 2 puffs into the lungs every 6 (six) hours as needed for wheezing or shortness of breath. 07/20/19   Vivi Barrack, MD  blood glucose meter kit and supplies KIT Dispense based on patient and insurance preference. Use up to four times daily as directed. (FOR ICD-9 250.00, 250.01). 07/31/18   Vivi Barrack, MD  celecoxib (CELEBREX) 100 MG capsule Take 1 capsule (100 mg total) by mouth 2 (two) times daily. 08/11/19   Vivi Barrack, MD  cetirizine (ZYRTEC) 10 MG tablet Take 1 tablet (10 mg total) by mouth daily. 09/17/18   Vivi Barrack, MD  clonazePAM (KLONOPIN) 0.5 MG tablet TAKE 1 TABLET BY MOUTH EVERY DAY AS NEEDED 08/10/19   Vivi Barrack, MD  Continuous Blood Gluc Receiver (FREESTYLE LIBRE READER) DEVI 1 Device by Does not apply route daily as needed (to check blood sugar). 08/14/18   Vivi Barrack, MD  Continuous Blood Gluc Sensor (FREESTYLE LIBRE SENSOR SYSTEM) MISC Apply 1 sensor to skin every 14 days. 08/14/18   Vivi Barrack, MD  Erenumab-aooe (AIMOVIG) 70 MG/ML SOAJ Inject 70  mg into the skin every 30 (thirty) days. 05/06/19   Tomi Likens, Adam R, DO  fluticasone (FLONASE) 50 MCG/ACT nasal spray Place 2 sprays into both nostrils daily for 7 days. 09/08/19 09/15/19  Sahmir Weatherbee, Marguerita Beards, PA-C  gabapentin (NEURONTIN) 600 MG tablet Take 1 tablet (600 mg total) by mouth 3 (three) times daily. 03/30/19   Vivi Barrack, MD  guaiFENesin (MUCINEX) 600 MG 12 hr tablet Take 1 tablet (600 mg total) by mouth 2 (two) times daily for 7 days. 09/08/19 09/15/19  Laurianne Floresca, Marguerita Beards, PA-C  hydrOXYzine (ATARAX/VISTARIL) 50 MG tablet TAKE 0.5-2 TABLETS (25-100 MG TOTAL) BY MOUTH AT BEDTIME AS NEEDED FOR ANXIETY. 08/18/19   Vivi Barrack, MD  ipratropium (ATROVENT)  0.06 % nasal spray Place 2 sprays into both nostrils 4 (four) times daily. 07/20/19   Vivi Barrack, MD  IRON-VITAMIN C PO Take by mouth.    [provider]  lamoTRIgine (LAMICTAL) 200 MG tablet Take 200 mg by mouth.  07/09/19   [provider]  Menthol (CEPACOL SORE THROAT) 5.4 MG LOZG Use as directed 1 lozenge (5.4 mg total) in the mouth or throat every 2 (two) hours as needed. 09/08/19   Shyna Duignan, Marguerita Beards, PA-C  metFORMIN (GLUCOPHAGE) 1000 MG tablet Take 1 tablet (1,000 mg total) by mouth daily with breakfast. 07/20/19   Vivi Barrack, MD  ondansetron (ZOFRAN-ODT) 4 MG disintegrating tablet TAKE 1 TABLET BY MOUTH EVERY 8 HOURS AS NEEDED FOR NAUSEA AND VOMITING 09/04/19   Vivi Barrack, MD  pantoprazole (PROTONIX) 40 MG tablet TAKE 1 TABLET BY MOUTH EVERY DAY IN THE EVENING 06/09/19   Vivi Barrack, MD  QUEtiapine (SEROQUEL) 50 MG tablet Take 50 mg by mouth at bedtime. 07/29/19   [provider]  REXULTI 0.5 MG TABS Take 1 tablet by mouth daily. 04/17/18   [provider]  rosuvastatin (CRESTOR) 40 MG tablet TAKE 1 TABLET BY MOUTH EVERY DAY 06/01/19   Vivi Barrack, MD  sodium chloride (OCEAN) 0.65 % SOLN nasal spray Place 1 spray into both nostrils as needed for congestion. 09/08/19   Nylen Creque, Marguerita Beards, PA-C  sucralfate (CARAFATE) 1 GM/10ML suspension Take 10 mLs (1 g total) by mouth 4 (four) times daily -  with meals and at bedtime. 06/20/19   Henderly, Britni A, PA-C  tiZANidine (ZANAFLEX) 4 MG capsule Take 1 capsule (4 mg total) by mouth 3 (three) times daily. 04/28/19   Vivi Barrack, MD  tiZANidine (ZANAFLEX) 4 MG tablet Take 1 tablet (4 mg total) by mouth 3 (three) times daily. 07/20/19   Vivi Barrack, MD  TOSYMRA 10 MG/ACT SOLN PLACE 1 SPRAY INTO THE NOSE EVERY HOUR AS NEEDED (MAXIMUM 3 SPRAYS IN 24 HOURS) 08/27/19   Tomi Likens, Adam R, DO  Vitamin D, Ergocalciferol, (DRISDOL) 1.25 MG (50000 UNIT) CAPS capsule TAKE 1 CAPSULE BY MOUTH EVERY 7 DAYS 05/28/19   Lyndal Pulley, DO  vortioxetine HBr (TRINTELLIX) 10 MG TABS tablet Take 10 mg by mouth daily.    [provider]  zolpidem (AMBIEN) 10 MG tablet Take 1 tablet (10 mg total) by mouth at bedtime as needed for sleep. 07/20/19   Vivi Barrack, MD    Family History Family History  Problem Relation Age of Onset  . Heart disease Mother   . Lung cancer Mother   . Rheum arthritis Mother   . Arthritis Mother   . Cancer Mother   . COPD Mother   .  Miscarriages / [place] Mother   . Diabetes Father   . Heart disease Father   . Breast cancer Paternal Aunt     Social History Social History   Tobacco Use  . Smoking status: Never Smoker  . Smokeless tobacco: Never Used  Vaping Use  . Vaping Use: Never used  Substance Use Topics  . Alcohol use: Yes    Comment: Occ  . Drug use: Yes    Types: Marijuana    Comment: THC     Allergies   Ciprofloxacin   Review of Systems Review of Systems   Physical Exam Triage Vital Signs ED Triage Vitals [09/08/19 0954]  Enc Vitals Group     BP (!) 153/78     Pulse Rate 78     Resp 16     Temp 98.2 F (36.8 C)     Temp src      SpO2 99 %     Weight      Height      Head Circumference      Peak Flow      Pain Score 0     Pain Loc      Pain Edu?      Excl. in [place]?    No data found.  Updated Vital Signs BP (!) 153/78   Pulse 78   Temp 98.2 F (36.8 C)   Resp 16   LMP 08/02/2019   SpO2 99%   Visual Acuity Right Eye Distance:   Left Eye Distance:   Bilateral Distance:    Right Eye Near:   Left Eye Near:    Bilateral Near:     Physical Exam Vitals and nursing note reviewed.  Constitutional:      General: She is not in acute distress.    Appearance: Normal appearance. She is well-developed. She is not ill-appearing.  HENT:     Head: Normocephalic and atraumatic.     Comments: There is some maxillary tenderness on palpation    Left Ear: Tympanic membrane normal.     Nose: Congestion and rhinorrhea present.      Mouth/Throat:     Mouth: Mucous membranes are moist.     Pharynx: Oropharynx is clear.  Eyes:     Extraocular Movements: Extraocular movements intact.     Conjunctiva/sclera: Conjunctivae normal.     Pupils: Pupils are equal, round, and reactive to light.  Cardiovascular:     Rate and Rhythm: Normal rate and regular rhythm.     Heart sounds: No murmur heard.   Pulmonary:     Effort: Pulmonary effort is normal. No respiratory distress.     Breath sounds: Normal breath sounds.  Abdominal:     Palpations: Abdomen is soft.     Tenderness: There is no abdominal tenderness.  Musculoskeletal:     Cervical back: Neck supple.  Skin:    General: Skin is warm and dry.  Neurological:     Mental Status: She is alert.      UC Treatments / Results  Labs (all labs ordered are listed, but only abnormal results are displayed) Labs Reviewed  SARS CORONAVIRUS 2 (TAT 6-24 HRS)    EKG   Radiology No results found.  Procedures Procedures (including critical care time)  Medications Ordered in UC Medications - No data to display  Initial Impression / Assessment and Plan / UC Course  I have reviewed the triage vital signs and the nursing notes.  Pertinent labs & imaging results that were  available during my care of the patient were reviewed by me and considered in my medical decision making (see chart for details).     #Viral URI Patient is a 44 year old presenting with 4-day history of viral URI.  Given short duration will treat with symptomatic care.  Discussed expectations of courses of viral illness.  Instructed to follow-up if not improving over the next 5 to 7 days.  Emergency department precautions were discussed.  Covid sent.  Patient verbalized understanding plan of care. Final Clinical Impressions(s) / UC Diagnoses   Final diagnoses:  Viral upper respiratory tract infection     Discharge Instructions     Take medicines as prescribed -Flonase twice a day for 1  week -Mucinex twice a day for 1 week -Nasal saline spray throughout the day -Cepacol menthol lozenge for sore throat -Tylenol or ibuprofen over-the-counter for labeling as needed for sore throat or pain  If not improving over the next 5 to 7 days return or follow-up with your primary care  If significantly worsening with shortness of breath, high fevers, return to clinic or go to the emergency department  If your Covid-19 test is positive, you will receive a phone call from [facility] regarding your results. Negative test results are not called. Both positive and negative results area always visible on MyChart. If you do not have a MyChart account, sign up instructions are in your discharge papers.   Persons who are directed to care for themselves at home may discontinue isolation under the following conditions:  . At least 10 days have passed since symptom onset and . At least 24 hours have passed without running a fever (this means without the use of fever-reducing medications) and . Other symptoms have improved.  Persons infected with COVID-19 who never develop symptoms may discontinue isolation and other precautions 10 days after the date of their first positive COVID-19 test.       ED Prescriptions    Medication Sig Dispense Auth. Provider   fluticasone (FLONASE) 50 MCG/ACT nasal spray Place 2 sprays into both nostrils daily for 7 days. 1 g Andrae Claunch, Marguerita Beards, PA-C   guaiFENesin (MUCINEX) 600 MG 12 hr tablet Take 1 tablet (600 mg total) by mouth 2 (two) times daily for 7 days. 14 tablet Ettie Krontz, Marguerita Beards, PA-C   sodium chloride (OCEAN) 0.65 % SOLN nasal spray Place 1 spray into both nostrils as needed for congestion. 44 mL Kerilyn Cortner, Marguerita Beards, PA-C   Menthol (CEPACOL SORE THROAT) 5.4 MG LOZG Use as directed 1 lozenge (5.4 mg total) in the mouth or throat every 2 (two) hours as needed. 30 lozenge Janelly Switalski, Marguerita Beards, PA-C     PDMP not reviewed this encounter.   Purnell Shoemaker, PA-C 09/08/19  2357

## 2019-09-08 NOTE — ED Triage Notes (Signed)
Pt c/o nasal congestion x 1 week, now with nonproductive cough and sore throat. Pt is COVID vaccinated, but requesting testing

## 2019-09-09 ENCOUNTER — Encounter: Payer: Self-pay | Admitting: Physician Assistant

## 2019-09-09 ENCOUNTER — Telehealth (INDEPENDENT_AMBULATORY_CARE_PROVIDER_SITE_OTHER): Payer: BC Managed Care – PPO | Admitting: Physician Assistant

## 2019-09-09 VITALS — Ht 62.0 in | Wt 198.0 lb

## 2019-09-09 DIAGNOSIS — R05 Cough: Secondary | ICD-10-CM

## 2019-09-09 DIAGNOSIS — R059 Cough, unspecified: Secondary | ICD-10-CM

## 2019-09-09 MED ORDER — DOXYCYCLINE HYCLATE 100 MG PO TABS
100.0000 mg | ORAL_TABLET | Freq: Two times a day (BID) | ORAL | 0 refills | Status: DC
Start: 2019-09-09 — End: 2019-10-23

## 2019-09-09 MED ORDER — HYDROCOD POLST-CPM POLST ER 10-8 MG/5ML PO SUER: 5.0000 mL | Freq: Every evening | ORAL | 0 refills | Status: DC | PRN

## 2019-09-09 NOTE — Progress Notes (Signed)
Virtual Visit via Video   I connected with Mouna Yager on 09/09/19 at  2:00 PM EDT by a video enabled telemedicine application and verified that I am speaking with the correct person using two identifiers. Location patient: Home Location provider: [facility], Office Persons participating in the virtual visit: Laiza Veenstra, Inda Coke PA-C, Anselmo Pickler, LPN   I discussed the limitations of evaluation and management by telemedicine and the availability of in person appointments. The patient expressed understanding and agreed to proceed.  I acted as a Education administrator for [employer], PA-C [employer], LPN   Subjective:   HPI:   Sinus problem Pt c/o sinus pressure, facial pain, nasal congestion with yellow drainage. Started 6 days ago. Denies fever but has had some chills, ear pressure and sore throat. Pt lives with sister and she has pneumonia right now dx yesterday. Taking sudafed, nasal spray and using inhaler. She is using inhaler at night when she is coughing; has difficulty sleeping.  Not a smoker.  Went to urgent care yesterday and tested negative for COVID-19.  ROS: See pertinent positives and negatives per HPI.  Patient Active Problem List   Diagnosis Date Noted   Status post left hip replacement 03/12/2019   T2DM (type 2 diabetes mellitus) ([place]) 07/31/2018   Dyslipidemia associated with type 2 diabetes mellitus ([place]) 07/31/2018   Allergic rhinitis 07/31/2018   Mild intermittent asthma, uncomplicated 76/73/4193   GERD (gastroesophageal reflux disease) 07/31/2018   S/P gastric bypass 07/31/2018   Migraine 07/31/2018   Anxiety 07/31/2018   Depression, major, single episode, complete remission ([place]) 07/31/2018   Insomnia 79/03/4095   Umbilical hernia 35/32/9924   Iron deficiency anemia 06/09/2018   Cervicogenic headache 05/12/2018   Low back pain 05/12/2018   Polyarthralgia 05/12/2018    Social History   Tobacco Use   Smoking status:  Never Smoker   Smokeless tobacco: Never Used  Substance Use Topics   Alcohol use: Yes    Comment: Occ    Current Outpatient Medications:    acetaminophen (TYLENOL) 325 MG tablet, Take 650 mg by mouth every 6 (six) hours as needed for mild pain., Disp: , Rfl:    albuterol (VENTOLIN HFA) 108 (90 Base) MCG/ACT inhaler, Inhale 2 puffs into the lungs every 6 (six) hours as needed for wheezing or shortness of breath., Disp: 18 g, Rfl: 2   blood glucose meter kit and supplies KIT, Dispense based on patient and insurance preference. Use up to four times daily as directed. (FOR ICD-9 250.00, 250.01)., Disp: 1 each, Rfl: 0   celecoxib (CELEBREX) 100 MG capsule, Take 1 capsule (100 mg total) by mouth 2 (two) times daily., Disp: 60 capsule, Rfl: 1   cetirizine (ZYRTEC) 10 MG tablet, Take 1 tablet (10 mg total) by mouth daily., Disp: 90 tablet, Rfl: 1   clonazePAM (KLONOPIN) 0.5 MG tablet, TAKE 1 TABLET BY MOUTH EVERY DAY AS NEEDED, Disp: 30 tablet, Rfl: 5   Continuous Blood Gluc Receiver (FREESTYLE LIBRE READER) DEVI, 1 Device by Does not apply route daily as needed (to check blood sugar)., Disp: 1 Device, Rfl: 0   Continuous Blood Gluc Sensor (FREESTYLE LIBRE SENSOR SYSTEM) MISC, Apply 1 sensor to skin every 14 days., Disp: 2 each, Rfl: 11   Erenumab-aooe (AIMOVIG) 70 MG/ML SOAJ, Inject 70 mg into the skin every 30 (thirty) days., Disp: 1 pen, Rfl: 5   fluticasone (FLONASE) 50 MCG/ACT nasal spray, Place 2 sprays into both nostrils daily for 7 days., Disp: 1 g,  Rfl: 0   gabapentin (NEURONTIN) 600 MG tablet, Take 1 tablet (600 mg total) by mouth 3 (three) times daily., Disp: 270 tablet, Rfl: 1   hydrOXYzine (ATARAX/VISTARIL) 50 MG tablet, TAKE 0.5-2 TABLETS (25-100 MG TOTAL) BY MOUTH AT BEDTIME AS NEEDED FOR ANXIETY., Disp: 180 tablet, Rfl: 1   ipratropium (ATROVENT) 0.06 % nasal spray, Place 2 sprays into both nostrils 4 (four) times daily., Disp: 15 mL, Rfl: 12   IRON-VITAMIN C PO, Take by  mouth., Disp: , Rfl:    lamoTRIgine (LAMICTAL) 200 MG tablet, Take 200 mg by mouth. , Disp: , Rfl:    metFORMIN (GLUCOPHAGE) 1000 MG tablet, Take 1 tablet (1,000 mg total) by mouth daily with breakfast., Disp: 180 tablet, Rfl: 3   ondansetron (ZOFRAN-ODT) 4 MG disintegrating tablet, TAKE 1 TABLET BY MOUTH EVERY 8 HOURS AS NEEDED FOR NAUSEA AND VOMITING, Disp: 9 tablet, Rfl: 4   pantoprazole (PROTONIX) 40 MG tablet, TAKE 1 TABLET BY MOUTH EVERY DAY IN THE EVENING, Disp: 90 tablet, Rfl: 2   QUEtiapine (SEROQUEL) 50 MG tablet, Take 50 mg by mouth at bedtime., Disp: , Rfl:    REXULTI 0.5 MG TABS, Take 1 tablet by mouth daily., Disp: , Rfl:    rosuvastatin (CRESTOR) 40 MG tablet, TAKE 1 TABLET BY MOUTH EVERY DAY, Disp: 90 tablet, Rfl: 2   sodium chloride (OCEAN) 0.65 % SOLN nasal spray, Place 1 spray into both nostrils as needed for congestion., Disp: 44 mL, Rfl: 0   sucralfate (CARAFATE) 1 GM/10ML suspension, Take 10 mLs (1 g total) by mouth 4 (four) times daily -  with meals and at bedtime., Disp: 420 mL, Rfl: 0   tiZANidine (ZANAFLEX) 4 MG tablet, Take 1 tablet (4 mg total) by mouth 3 (three) times daily., Disp: 90 tablet, Rfl: 5   TOSYMRA 10 MG/ACT SOLN, PLACE 1 SPRAY INTO THE NOSE EVERY HOUR AS NEEDED (MAXIMUM 3 SPRAYS IN 24 HOURS), Disp: 6 each, Rfl: 4   Vitamin D, Ergocalciferol, (DRISDOL) 1.25 MG (50000 UNIT) CAPS capsule, TAKE 1 CAPSULE BY MOUTH EVERY 7 DAYS, Disp: 12 capsule, Rfl: 0   vortioxetine HBr (TRINTELLIX) 10 MG TABS tablet, Take 10 mg by mouth daily., Disp: , Rfl:    zolpidem (AMBIEN) 10 MG tablet, Take 1 tablet (10 mg total) by mouth at bedtime as needed for sleep., Disp: 30 tablet, Rfl: 5   chlorpheniramine-HYDROcodone (TUSSIONEX PENNKINETIC ER) 10-8 MG/5ML SUER, Take 5 mLs by mouth at bedtime as needed for cough., Disp: 70 mL, Rfl: 0   doxycycline (VIBRA-TABS) 100 MG tablet, Take 1 tablet (100 mg total) by mouth 2 (two) times daily., Disp: 20 tablet, Rfl: 0    guaiFENesin (MUCINEX) 600 MG 12 hr tablet, Take 1 tablet (600 mg total) by mouth 2 (two) times daily for 7 days. (Patient not taking: Reported on 09/09/2019), Disp: 14 tablet, Rfl: 0  Allergies  Allergen Reactions   Ciprofloxacin Itching    Objective:   VITALS: Per patient if applicable, see vitals. GENERAL: Alert, appears well and in no acute distress. HEENT: Atraumatic, conjunctiva clear, no obvious abnormalities on inspection of external nose and ears. NECK: Normal movements of the head and neck. CARDIOPULMONARY: No increased WOB. Speaking in clear sentences. I:E ratio WNL.  MS: Moves all visible extremities without noticeable abnormality. PSYCH: Pleasant and cooperative, well-groomed. Speech normal rate and rhythm. Affect is appropriate. Insight and judgement are appropriate. Attention is focused, linear, and appropriate.  NEURO: CN grossly intact. Oriented as arrived to appointment on  time with no prompting. Moves both UE equally.  SKIN: No obvious lesions, wounds, erythema, or cyanosis noted on face or hands.  Assessment and Plan:   Neima was seen today for sinus problem.  Diagnoses and all orders for this visit:  Cough No red flags on discussion. She is no apparent distress. Will initiate oral doxycycline per orders and tussionex. Sleepy precautions advised re: tussionex, and discussed to not mix with controlled substances -- patient verbalized understanding.  Discussed taking medications as prescribed. Reviewed return precautions including worsening fever, SOB, worsening cough or other concerns. Push fluids and rest. I recommend that patient follow-up if symptoms worsen or persist despite treatment x 7-10 days, sooner if needed.   Other orders -     doxycycline (VIBRA-TABS) 100 MG tablet; Take 1 tablet (100 mg total) by mouth 2 (two) times daily. -     chlorpheniramine-HYDROcodone (TUSSIONEX PENNKINETIC ER) 10-8 MG/5ML SUER; Take 5 mLs by mouth at bedtime as needed for  cough.     Reviewed expectations re: course of current medical issues.  Discussed self-management of symptoms.  Outlined signs and symptoms indicating need for more acute intervention.  Patient verbalized understanding and all questions were answered.  Health Maintenance issues including appropriate healthy diet, exercise, and smoking avoidance were discussed with patient.  See orders for this visit as documented in the electronic medical record.  I discussed the assessment and treatment plan with the patient. The patient was provided an opportunity to ask questions and all were answered. The patient agreed with the plan and demonstrated an understanding of the instructions.   The patient was advised to call back or seek an in-person evaluation if the symptoms worsen or if the condition fails to improve as anticipated.   CMA or LPN served as scribe during this visit. History, Physical, and Plan performed by medical provider. The above documentation has been reviewed and is accurate and complete.  [place], [place] 09/09/2019

## 2019-09-12 ENCOUNTER — Other Ambulatory Visit: Payer: Self-pay | Admitting: Family Medicine

## 2019-09-22 ENCOUNTER — Ambulatory Visit: Payer: BC Managed Care – PPO | Admitting: Orthopaedic Surgery

## 2019-09-26 ENCOUNTER — Other Ambulatory Visit: Payer: Self-pay | Admitting: Family Medicine

## 2019-09-28 ENCOUNTER — Telehealth: Payer: Self-pay | Admitting: Neurology

## 2019-09-28 NOTE — Telephone Encounter (Signed)
[employer] 09/26/19 @ 10:11am:  "Caller states she was seen a month ago and she has not heard from the office about getting botox treatments approved."

## 2019-09-28 NOTE — Telephone Encounter (Signed)
I'll get started on it and let you know when it's approved.

## 2019-09-28 NOTE — Telephone Encounter (Signed)
Usually 200 units.

## 2019-09-28 NOTE — Telephone Encounter (Signed)
I will start working on her PA for Botox- How much Botox will she need? Thanks!

## 2019-09-29 ENCOUNTER — Telehealth: Payer: BC Managed Care – PPO | Admitting: Neurology

## 2019-09-30 ENCOUNTER — Encounter: Payer: Self-pay | Admitting: Neurology

## 2019-09-30 NOTE — Progress Notes (Addendum)
Submitted BV thru [facility]- came back PA needed; buy and bill or Chemical engineer [employer] @ [phone]).   Submitted PA thru [facility]- Approval valid for 2 visits. Effective dates 09/30/19 to 03/28/20. Ref #: [id number]

## 2019-10-01 ENCOUNTER — Other Ambulatory Visit: Payer: Self-pay | Admitting: Family Medicine

## 2019-10-23 ENCOUNTER — Other Ambulatory Visit: Payer: Self-pay

## 2019-10-23 ENCOUNTER — Ambulatory Visit (INDEPENDENT_AMBULATORY_CARE_PROVIDER_SITE_OTHER): Payer: BC Managed Care – PPO | Admitting: Neurology

## 2019-10-23 ENCOUNTER — Telehealth (INDEPENDENT_AMBULATORY_CARE_PROVIDER_SITE_OTHER): Payer: BC Managed Care – PPO | Admitting: Family Medicine

## 2019-10-23 ENCOUNTER — Telehealth: Payer: Self-pay | Admitting: Family Medicine

## 2019-10-23 ENCOUNTER — Other Ambulatory Visit: Payer: Self-pay | Admitting: *Deleted

## 2019-10-23 ENCOUNTER — Encounter: Payer: Self-pay | Admitting: Family Medicine

## 2019-10-23 ENCOUNTER — Telehealth: Payer: Self-pay

## 2019-10-23 DIAGNOSIS — M2550 Pain in unspecified joint: Secondary | ICD-10-CM | POA: Diagnosis not present

## 2019-10-23 DIAGNOSIS — F419 Anxiety disorder, unspecified: Secondary | ICD-10-CM

## 2019-10-23 DIAGNOSIS — G43709 Chronic migraine without aura, not intractable, without status migrainosus: Secondary | ICD-10-CM | POA: Diagnosis not present

## 2019-10-23 DIAGNOSIS — G4700 Insomnia, unspecified: Secondary | ICD-10-CM | POA: Diagnosis not present

## 2019-10-23 MED ORDER — CLONAZEPAM 0.5 MG PO TABS
0.5000 mg | ORAL_TABLET | Freq: Two times a day (BID) | ORAL | 1 refills | Status: DC | PRN
Start: 2019-10-23 — End: 2019-10-23

## 2019-10-23 MED ORDER — PREGABALIN 150 MG PO CAPS: 150.0000 mg | ORAL_CAPSULE | Freq: Two times a day (BID) | ORAL | 5 refills | Status: DC

## 2019-10-23 MED ORDER — ZOLPIDEM TARTRATE 5 MG PO TABS: 5.0000 mg | ORAL_TABLET | Freq: Every evening | ORAL | 5 refills | Status: DC | PRN

## 2019-10-23 MED ORDER — PREGABALIN 150 MG PO CAPS
150.0000 mg | ORAL_CAPSULE | Freq: Two times a day (BID) | ORAL | 5 refills | Status: DC
Start: 2019-10-23 — End: 2020-02-29

## 2019-10-23 MED ORDER — ONABOTULINUMTOXINA 100 UNITS IJ SOLR
160.0000 [IU] | Freq: Once | INTRAMUSCULAR | Status: AC
  Administered 2019-10-23: 155 [IU] via INTRAMUSCULAR

## 2019-10-23 MED ORDER — CLONAZEPAM 0.5 MG PO TABS: 0.5000 mg | ORAL_TABLET | Freq: Two times a day (BID) | ORAL | 1 refills | Status: DC | PRN

## 2019-10-23 MED ORDER — TRAMADOL HCL 50 MG PO TABS: 50.0000 mg | ORAL_TABLET | Freq: Three times a day (TID) | ORAL | 0 refills | Status: AC | PRN

## 2019-10-23 MED ORDER — TRAMADOL HCL 50 MG PO TABS: 50.0000 mg | ORAL_TABLET | Freq: Three times a day (TID) | ORAL | 0 refills | Status: DC | PRN

## 2019-10-23 MED ORDER — ZOLPIDEM TARTRATE 5 MG PO TABS
5.0000 mg | ORAL_TABLET | Freq: Every evening | ORAL | 5 refills | Status: DC | PRN
Start: 2019-10-23 — End: 2019-10-23

## 2019-10-23 NOTE — Telephone Encounter (Signed)
Spoke with pharmacy let her know PCP unable to talk to the pharmacist today. Per Dr [name]  have not added anything to her regimen that she has not been on previously. Recommend she fill everything but the tramadol or try another pharmacy Pharmacies refused to refill order adding pt mention to her she will change pharmacy. Order send today cancelled will send to [facility] [address]

## 2019-10-23 NOTE — Assessment & Plan Note (Signed)
Did not tolerate higher dose of Ambien.  Will decrease to 5 mg daily.

## 2019-10-23 NOTE — Assessment & Plan Note (Signed)
Gabapentin does not seem to be effective.  We will switch to equivalent dose of Lyrica 150 mg twice daily.  She will check in with me in a couple weeks to limit how this is working for her.  We will also send in tramadol to use as needed.  Discussed attentional interactions with other medications including warning signs for serotonin syndrome.  She voiced understanding.

## 2019-10-23 NOTE — Telephone Encounter (Signed)
Pharmacy called and is concerned about drug interaction with 2 of pt.'s medications. Pharmacist states she is not going to fill the medication until she talks to the provider

## 2019-10-23 NOTE — Progress Notes (Signed)
[facility]  ° °Procedure Note Botox ° °Attending: Dr. Adaijah Endres ° °Preoperative Diagnosis(es): Chronic migraine ° °Consent obtained from: The patient °Benefits discussed included, but were not limited to decreased muscle tightness, increased joint range of motion, and decreased pain.  Risk discussed included, but were not limited pain and discomfort, bleeding, bruising, excessive weakness, venous thrombosis, muscle atrophy and dysphagia.  Anticipated outcomes of the procedure as well as he risks and benefits of the alternatives to the procedure, and the roles and tasks of the personnel to be involved, were discussed with the patient, and the patient consents to the procedure and agrees to proceed. A copy of the patient medication guide was given to the patient which explains the blackbox warning. ° °Patients identity and treatment sites confirmed Yes.  . ° °Details of Procedure: °Skin was cleaned with alcohol. Prior to injection, the needle plunger was aspirated to make sure the needle was not within a blood vessel.  There was no blood retrieved on aspiration.   ° °Following is a summary of the muscles injected  And the amount of Botulinum toxin used: ° °Dilution °200 units of Botox was reconstituted with 4 ml of preservative free normal saline. °Time of reconstitution: At the time of the office visit (<30 minutes prior to injection)  ° °Injections  °155 total units of Botox was injected with a 30 gauge needle. ° °Injection Sites: °L occipitalis: 15 units- 3 sites  °R occiptalis: 15 units- 3 sites ° °L upper trapezius: 15 units- 3 sites °R upper trapezius: 15 units- 3 sits          °L paraspinal: 10 units- 2 sites °R paraspinal: 10 units- 2 sites ° °Face °L frontalis(2 injection sites):10 units   °R frontalis(2 injection sites):10 units         °L corrugator: 5 units   °R corrugator: 5 units           °Procerus: 5 units   °L temporalis: 20 units °R temporalis: 20 units  ° °Agent:  °200 units of botulinum Type  A (Onobotulinum Toxin type A) was reconstituted with 4 ml of preservative free normal saline.  °Time of reconstitution: At the time of the office visit (<30 minutes prior to injection)  ° ° ° Total injected (Units):  155 ° Total wasted (Units):  5 ° °Patient tolerated procedure well without complications.   °Reinjection is anticipated in 3 months. ° ° °

## 2019-10-23 NOTE — Progress Notes (Signed)
   Heidi Holland is a 44 y.o. female who presents today for a telephone visit.  Assessment/Plan:  Chronic Problems Addressed Today: Insomnia Did not tolerate higher dose of Ambien.  Will decrease to 5 mg daily.  Anxiety Worsened recently.  Will increase clonazepam 0.5 mg twice daily.  She is seeing a therapist.  She is also on Trintellix 10 mg daily and Seroquel 50 mg nightly.  We will switch from gabapentin to Lyrica which should hopefully help some as well.  Polyarthralgia Gabapentin does not seem to be effective.  We will switch to equivalent dose of Lyrica 150 mg twice daily.  She will check in with me in a couple weeks to limit how this is working for her.  We will also send in tramadol to use as needed.  Discussed attentional interactions with other medications including warning signs for serotonin syndrome.  She voiced understanding.     Subjective:  HPI:  See A/p.       Objective/Observations   NAD  Telephone Visit   I connected with Heidi Holland on 10/23/19 at  9:20 AM EDT via telephone and verified that I am speaking with the correct person using two identifiers. I discussed the limitations of evaluation and management by telemedicine and the availability of in person appointments. The patient expressed understanding and agreed to proceed.   Patient location: Home Provider location: [place] [employer] Persons participating in the virtual visit: Myself and Patient     [name]. [name], MD 10/23/2019 9:53 AM

## 2019-10-23 NOTE — Telephone Encounter (Signed)
Patient states she went to pharmacy to get medication and pharmacist  Disagrees with the combination of medication we had prescribed her.  [id number] - [place], [place] - [address] (Ph: [phone])  Patient requested we call pharmacy to figure out what medications she can get.

## 2019-10-23 NOTE — Telephone Encounter (Signed)
[facility], she stated not comfortable filling her medication due to the combination. Stated spoke with patient about this about an year ago and will like to speak directly with PCP, specially adding Tramadol to the list, or change pharmacy.

## 2019-10-23 NOTE — Telephone Encounter (Signed)
I don't have time to talk to the pharmacist today. We have not added anything to her regimen that she has not been on previously. Recommend she fill everything but the tramadol or try another pharmacy.  [name]. [name], MD 10/23/2019 11:59 AM

## 2019-10-23 NOTE — Assessment & Plan Note (Signed)
Worsened recently.  Will increase clonazepam 0.5 mg twice daily.  She is seeing a therapist.  She is also on Trintellix 10 mg daily and Seroquel 50 mg nightly.  We will switch from gabapentin to Lyrica which should hopefully help some as well.

## 2019-11-12 ENCOUNTER — Telehealth: Payer: Self-pay

## 2019-11-12 MED ORDER — TRAMADOL HCL 50 MG PO TABS: 50.0000 mg | ORAL_TABLET | Freq: Three times a day (TID) | ORAL | 5 refills | Status: AC | PRN

## 2019-11-12 NOTE — Telephone Encounter (Signed)
LAST APPOINTMENT DATE: 06/07/202  NEXT APPOINTMENT DATE: Visit date not found   Rx tramadol

## 2019-11-12 NOTE — Telephone Encounter (Signed)
MEDICATION: tramadol (ultram) 50 MG  [facility] [address]  Comments:   **Let patient know to contact pharmacy at the end of the day to make sure medication is ready. **  ** Please notify patient to allow 48-72 hours to process**  **Encourage patient to contact the pharmacy for refills or they can request refills through [facility]**

## 2019-11-15 ENCOUNTER — Other Ambulatory Visit: Payer: Self-pay | Admitting: Neurology

## 2019-11-16 ENCOUNTER — Other Ambulatory Visit: Payer: Self-pay | Admitting: *Deleted

## 2019-11-16 MED ORDER — ROSUVASTATIN CALCIUM 40 MG PO TABS
40.0000 mg | ORAL_TABLET | Freq: Every day | ORAL | 1 refills | Status: DC
Start: 2019-11-16 — End: 2020-07-21

## 2019-11-18 DIAGNOSIS — F4310 Post-traumatic stress disorder, unspecified: Secondary | ICD-10-CM | POA: Diagnosis not present

## 2019-11-19 ENCOUNTER — Institutional Professional Consult (permissible substitution): Payer: Self-pay | Admitting: Neurology

## 2019-11-19 ENCOUNTER — Encounter: Payer: Self-pay | Admitting: Neurology

## 2019-11-19 ENCOUNTER — Telehealth: Payer: Self-pay

## 2019-11-19 NOTE — Telephone Encounter (Signed)
Patient no showed 11/19/2019 appt with Dr. Frances Furbish. Pt had prior no show in June with Dr. Vickey Huger.  This is her second no show.

## 2019-11-19 NOTE — Telephone Encounter (Signed)
Please follow dismissal protocol as per our No Show Policy.   

## 2019-11-24 ENCOUNTER — Encounter: Payer: Self-pay | Admitting: Neurology

## 2019-12-09 ENCOUNTER — Telehealth: Payer: Self-pay | Admitting: *Deleted

## 2019-12-09 NOTE — Telephone Encounter (Signed)
Patient called back, I asked patient what the fax number is and she states that she believes that her psychologist is needing to fill out that paperwork not Dr.Parker. Is going to call back once she finds out .

## 2019-12-09 NOTE — Telephone Encounter (Signed)
Patient called back, and said Dr.Pakrer does not need to fill out the paperowrk.

## 2019-12-09 NOTE — Telephone Encounter (Signed)
LVM to return call  received [facility] form, need psychologist fax #  Will fax for to their office

## 2019-12-09 NOTE — Telephone Encounter (Signed)
Noted  

## 2019-12-14 DIAGNOSIS — F4310 Post-traumatic stress disorder, unspecified: Secondary | ICD-10-CM | POA: Diagnosis not present

## 2019-12-15 ENCOUNTER — Encounter: Payer: Self-pay | Admitting: Family Medicine

## 2019-12-15 ENCOUNTER — Other Ambulatory Visit: Payer: Self-pay

## 2019-12-15 ENCOUNTER — Ambulatory Visit (INDEPENDENT_AMBULATORY_CARE_PROVIDER_SITE_OTHER): Payer: BC Managed Care – PPO | Admitting: Family Medicine

## 2019-12-15 VITALS — BP 124/75 | HR 86 | Temp 97.3°F | Ht 62.0 in | Wt 215.0 lb

## 2019-12-15 DIAGNOSIS — F325 Major depressive disorder, single episode, in full remission: Secondary | ICD-10-CM

## 2019-12-15 DIAGNOSIS — E1169 Type 2 diabetes mellitus with other specified complication: Secondary | ICD-10-CM

## 2019-12-15 DIAGNOSIS — F419 Anxiety disorder, unspecified: Secondary | ICD-10-CM

## 2019-12-15 DIAGNOSIS — Z6839 Body mass index (BMI) 39.0-39.9, adult: Secondary | ICD-10-CM

## 2019-12-15 DIAGNOSIS — Z0001 Encounter for general adult medical examination with abnormal findings: Secondary | ICD-10-CM | POA: Diagnosis not present

## 2019-12-15 DIAGNOSIS — E119 Type 2 diabetes mellitus without complications: Secondary | ICD-10-CM

## 2019-12-15 DIAGNOSIS — E785 Hyperlipidemia, unspecified: Secondary | ICD-10-CM | POA: Diagnosis not present

## 2019-12-15 DIAGNOSIS — M2550 Pain in unspecified joint: Secondary | ICD-10-CM

## 2019-12-15 NOTE — Progress Notes (Signed)
Chief Complaint:  Heidi Holland is a 44 y.o. female who presents today for her annual comprehensive physical exam.    Assessment/Plan:  Chronic Problems Addressed Today: Depression, major, single episode, complete remission ([place]) Stable on current medications.  She is on Trintellix 10 mg daily, Rexulti 0.5 mg daily, and Seroquel 50 mg daily.  She will be following up with psych doctor soon.  Anxiety Overall stable but uncontrolled.  Continue clonazepam, Trintellix, and Seroquel.  Will be following up with therapist and psychiatrist seen.  Dyslipidemia associated with type 2 diabetes mellitus (HCC) Check lipids.  Continue Crestor 40 mg daily.  T2DM (type 2 diabetes mellitus) (HCC) Check A1c.  Continue Metformin 1000 mg daily.  Polyarthralgia Tramadol seems to be helping.  We will also continue Lyrica 150 mg twice daily.  Body mass index is 39.32 kg/m. / Obese  BMI Metric Follow Up - 12/15/19 1028      BMI Metric Follow Up-Please document annually   BMI Metric Follow Up Education provided            Preventative Healthcare: Check CBC, CMET, TSH, lipid panel, A1c.  Will be getting flu and Covid vaccine later today.  Up-to-date on Pap smear.  Patient Counseling(The following topics were reviewed and/or handout was given):  -Nutrition: Stressed importance of moderation in sodium/caffeine intake, saturated fat and cholesterol, caloric balance, sufficient intake of fresh fruits, vegetables, and fiber.  -Stressed the importance of regular exercise.   -Substance Abuse: Discussed cessation/primary prevention of tobacco, alcohol, or other drug use; driving or other dangerous activities under the influence; availability of treatment for abuse.   -Injury prevention: Discussed safety belts, safety helmets, smoke detector, smoking near bedding or upholstery.   -Sexuality: Discussed sexually transmitted diseases, partner selection, use of condoms, avoidance of unintended pregnancy and  contraceptive alternatives.   -Dental health: Discussed importance of regular tooth brushing, flossing, and dental visits.  -Health maintenance and immunizations reviewed. Please refer to Health maintenance section.  Return to care in 1 year for next preventative visit.     Subjective:  HPI:  She has no acute complaints today.   Lifestyle Diet: None.  Exercise: None.   Depression screen [facility] 2/9 07/31/2018  Decreased Interest 0  Down, Depressed, Hopeless 0  PHQ - 2 Score 0  Altered sleeping 2  Tired, decreased energy 2  Change in appetite 1  Feeling bad or failure about yourself  1  Trouble concentrating 0  Moving slowly or fidgety/restless 0  Suicidal thoughts 0  PHQ-9 Score 6    Health Maintenance Due  Topic Date Due  . Hepatitis C Screening  Never done  . OPHTHALMOLOGY EXAM  Never done  . HIV Screening  Never done  . FOOT EXAM  09/17/2019  . URINE MICROALBUMIN  09/17/2019     ROS: Per HPI, otherwise a complete review of systems was negative.   PMH:  The following were reviewed and entered/updated in epic: Past Medical History:  Diagnosis Date  . Allergy   . Anxiety   . Asthma   . Depression   . Diabetes mellitus without complication ([place])   . Diverticulitis   . GERD (gastroesophageal reflux disease)   . HPV in female   . Hyperlipidemia   . Migraines    Patient Active Problem List   Diagnosis Date Noted  . Status post left hip replacement 03/12/2019  . T2DM (type 2 diabetes mellitus) ([place]) 07/31/2018  . Dyslipidemia associated with type 2 diabetes mellitus ([place]) 07/31/2018  .  Allergic rhinitis 07/31/2018  . Mild intermittent asthma, uncomplicated 59/29/2446  . GERD (gastroesophageal reflux disease) 07/31/2018  . S/P gastric bypass 07/31/2018  . Migraine 07/31/2018  . Anxiety 07/31/2018  . Depression, major, single episode, complete remission ([place]) 07/31/2018  . Insomnia 07/31/2018  . Umbilical hernia 28/63/8177  . Iron deficiency anemia  06/09/2018  . Cervicogenic headache 05/12/2018  . Low back pain 05/12/2018  . Polyarthralgia 05/12/2018   Past Surgical History:  Procedure Laterality Date  . BOWEL RESECTION    . gasticbypass    . GASTRIC BYPASS    . KNEE ARTHROSCOPY    . TOTAL HIP ARTHROPLASTY Left     Family History  Problem Relation Age of Onset  . Heart disease Mother   . Lung cancer Mother   . Rheum arthritis Mother   . Arthritis Mother   . Cancer Mother   . COPD Mother   . Miscarriages / [place] Mother   . Diabetes Father   . Heart disease Father   . Breast cancer Paternal Aunt     Medications- reviewed and updated Current Outpatient Medications  Medication Sig Dispense Refill  . acetaminophen (TYLENOL) 325 MG tablet Take 650 mg by mouth every 6 (six) hours as needed for mild pain.    Marland Kitchen albuterol (VENTOLIN HFA) 108 (90 Base) MCG/ACT inhaler INHALE 2 PUFFS BY MOUTH EVERY 6 HOURS AS NEEDED FOR WHEEZE OR SHORTNESS OF BREATH 8.5 g 2  . blood glucose meter kit and supplies KIT Dispense based on patient and insurance preference. Use up to four times daily as directed. (FOR ICD-9 250.00, 250.01). 1 each 0  . celecoxib (CELEBREX) 100 MG capsule TAKE 1 CAPSULE BY MOUTH TWICE A DAY 60 capsule 1  . cetirizine (ZYRTEC) 10 MG tablet Take 1 tablet (10 mg total) by mouth daily. 90 tablet 1  . clonazePAM (KLONOPIN) 0.5 MG tablet Take 1 tablet (0.5 mg total) by mouth 2 (two) times daily as needed for anxiety. 60 tablet 1  . Continuous Blood Gluc Receiver (FREESTYLE LIBRE READER) DEVI 1 Device by Does not apply route daily as needed (to check blood sugar). 1 Device 0  . Continuous Blood Gluc Sensor (FREESTYLE LIBRE SENSOR SYSTEM) MISC Apply 1 sensor to skin every 14 days. 2 each 11  . Erenumab-aooe (AIMOVIG) 70 MG/ML SOAJ Inject 70 mg into the skin every 30 (thirty) days. 1 pen 5  . hydrOXYzine (ATARAX/VISTARIL) 50 MG tablet TAKE 0.5-2 TABLETS (25-100 MG TOTAL) BY MOUTH AT BEDTIME AS NEEDED FOR ANXIETY. 180 tablet 1    . ipratropium (ATROVENT) 0.06 % nasal spray Place 2 sprays into both nostrils 4 (four) times daily. 15 mL 12  . IRON-VITAMIN C PO Take by mouth.    . lamoTRIgine (LAMICTAL) 200 MG tablet Take 200 mg by mouth.     . metFORMIN (GLUCOPHAGE) 1000 MG tablet Take 1 tablet (1,000 mg total) by mouth daily with breakfast. 180 tablet 3  . ondansetron (ZOFRAN-ODT) 4 MG disintegrating tablet TAKE 1 TABLET BY MOUTH EVERY 8 HOURS AS NEEDED FOR NAUSEA AND VOMITING 9 tablet 4  . pantoprazole (PROTONIX) 40 MG tablet TAKE 1 TABLET BY MOUTH EVERY DAY IN THE EVENING 90 tablet 2  . pregabalin (LYRICA) 150 MG capsule Take 1 capsule (150 mg total) by mouth 2 (two) times daily. 60 capsule 5  . QUEtiapine (SEROQUEL) 50 MG tablet Take 50 mg by mouth at bedtime.    Marland Kitchen REXULTI 0.5 MG TABS Take 1 tablet by mouth daily.    Marland Kitchen  rosuvastatin (CRESTOR) 40 MG tablet Take 1 tablet (40 mg total) by mouth daily. 90 tablet 1  . sodium chloride (OCEAN) 0.65 % SOLN nasal spray Place 1 spray into both nostrils as needed for congestion. 44 mL 0  . tiZANidine (ZANAFLEX) 4 MG tablet Take 1 tablet (4 mg total) by mouth 3 (three) times daily. 90 tablet 5  . TOSYMRA 10 MG/ACT SOLN PLACE 1 SPRAY INTO THE NOSE EVERY HOUR AS NEEDED (MAXIMUM 3 SPRAYS IN 24 HOURS) 6 each 4  . Vitamin D, Ergocalciferol, (DRISDOL) 1.25 MG (50000 UNIT) CAPS capsule TAKE 1 CAPSULE BY MOUTH EVERY 7 DAYS 12 capsule 0  . vortioxetine HBr (TRINTELLIX) 10 MG TABS tablet Take 10 mg by mouth daily.    Marland Kitchen zolpidem (AMBIEN) 5 MG tablet Take 1 tablet (5 mg total) by mouth at bedtime as needed for sleep. 30 tablet 5  . fluticasone (FLONASE) 50 MCG/ACT nasal spray Place 2 sprays into both nostrils daily for 7 days. 1 g 0   No current facility-administered medications for this visit.    Allergies-reviewed and updated Allergies  Allergen Reactions  . Ciprofloxacin Itching    Social History   Socioeconomic History  . Marital status: Single    Spouse name: Not on file  .  Number of children: 0  . Years of education: 62  . Highest education level: Associate degree: occupational, Hotel manager, or vocational program  Occupational History    Employer: [employer]  Tobacco Use  . Smoking status: Never Smoker  . Smokeless tobacco: Never Used  Vaping Use  . Vaping Use: Never used  Substance and Sexual Activity  . Alcohol use: Yes    Comment: Occ  . Drug use: Yes    Types: Marijuana    Comment: THC  . Sexual activity: Never  Other Topics Concern  . Not on file  Social History Narrative   Right handed.    Social Determinants of Health   Financial Resource Strain:   . Difficulty of Paying Living Expenses: Not on file  Food Insecurity:   . Worried About Charity fundraiser in the Last Year: Not on file  . Ran Out of Food in the Last Year: Not on file  Transportation Needs:   . Lack of Transportation (Medical): Not on file  . Lack of Transportation (Non-Medical): Not on file  Physical Activity:   . Days of Exercise per Week: Not on file  . Minutes of Exercise per Session: Not on file  Stress:   . Feeling of Stress : Not on file  Social Connections:   . Frequency of Communication with Friends and Family: Not on file  . Frequency of Social Gatherings with Friends and Family: Not on file  . Attends Religious Services: Not on file  . Active Member of Clubs or Organizations: Not on file  . Attends Archivist Meetings: Not on file  . Marital Status: Not on file        Objective:  Physical Exam: BP 124/75   Pulse 86   Temp (!) 97.3 F (36.3 C) (Temporal)   Ht 5' 2"  (1.575 m)   Wt 215 lb (97.5 kg)   LMP 11/14/2019   SpO2 97%   BMI 39.32 kg/m   Body mass index is 39.32 kg/m. Wt Readings from Last 3 Encounters:  12/15/19 215 lb (97.5 kg)  09/09/19 198 lb (89.8 kg)  08/06/19 198 lb (89.8 kg)   Gen: NAD, resting comfortably HEENT: TMs normal bilaterally. OP  clear. No thyromegaly noted.  CV: RRR with no murmurs appreciated Pulm: NWOB, CTAB  with no crackles, wheezes, or rhonchi GI: Normal bowel sounds present. Soft, Nontender, Nondistended. MSK: no edema, cyanosis, or clubbing noted Skin: warm, dry Neuro: CN2-12 grossly intact. Strength 5/5 in upper and lower extremities. Reflexes symmetric and intact bilaterally.  Psych: Normal affect and thought content     Ryder Man M. Jerline Pain, MD 12/15/2019 10:29 AM

## 2019-12-15 NOTE — Patient Instructions (Signed)
It was very nice to see you today!  We will check blood work today.  No other changes today.  I will probably see you back in 6 months.  Depending on your blood work.  Take care, Dr Jerline Pain  Please try these tips to maintain a healthy lifestyle:   Eat at least 3 REAL meals and 1-2 snacks per day.  Aim for no more than 5 hours between eating.  If you eat breakfast, please do so within one hour of getting up.    Each meal should contain half fruits/vegetables, one quarter protein, and one quarter carbs (no bigger than a computer mouse)   Cut down on sweet beverages. This includes juice, soda, and sweet tea.     Drink at least 1 glass of water with each meal and aim for at least 8 glasses per day   Exercise at least 150 minutes every week.    Preventive Care 27-80 Years Old, Female Preventive care refers to visits with your health care provider and lifestyle choices that can promote health and wellness. This includes:  A yearly physical exam. This may also be called an annual well check.  Regular dental visits and eye exams.  Immunizations.  Screening for certain conditions.  Healthy lifestyle choices, such as eating a healthy diet, getting regular exercise, not using drugs or products that contain nicotine and tobacco, and limiting alcohol use. What can I expect for my preventive care visit? Physical exam Your health care provider will check your:  Height and weight. This may be used to calculate body mass index (BMI), which tells if you are at a healthy weight.  Heart rate and blood pressure.  Skin for abnormal spots. Counseling Your health care provider may ask you questions about your:  Alcohol, tobacco, and drug use.  Emotional well-being.  Home and relationship well-being.  Sexual activity.  Eating habits.  Work and work Statistician.  Method of birth control.  Menstrual cycle.  Pregnancy history. What immunizations do I need?  Influenza (flu)  vaccine  This is recommended every year. Tetanus, diphtheria, and pertussis (Tdap) vaccine  You may need a Td booster every 10 years. Varicella (chickenpox) vaccine  You may need this if you have not been vaccinated. Zoster (shingles) vaccine  You may need this after age 64. Measles, mumps, and rubella (MMR) vaccine  You may need at least one dose of MMR if you were born in 1957 or later. You may also need a second dose. Pneumococcal conjugate (PCV13) vaccine  You may need this if you have certain conditions and were not previously vaccinated. Pneumococcal polysaccharide (PPSV23) vaccine  You may need one or two doses if you smoke cigarettes or if you have certain conditions. Meningococcal conjugate (MenACWY) vaccine  You may need this if you have certain conditions. Hepatitis A vaccine  You may need this if you have certain conditions or if you travel or work in places where you may be exposed to hepatitis A. Hepatitis B vaccine  You may need this if you have certain conditions or if you travel or work in places where you may be exposed to hepatitis B. Haemophilus influenzae type b (Hib) vaccine  You may need this if you have certain conditions. Human papillomavirus (HPV) vaccine  If recommended by your health care provider, you may need three doses over 6 months. You may receive vaccines as individual doses or as more than one vaccine together in one shot (combination vaccines). Talk with your health  care provider about the risks and benefits of combination vaccines. What tests do I need? Blood tests  Lipid and cholesterol levels. These may be checked every 5 years, or more frequently if you are over 30 years old.  Hepatitis C test.  Hepatitis B test. Screening  Lung cancer screening. You may have this screening every year starting at age 70 if you have a 30-pack-year history of smoking and currently smoke or have quit within the past 15 years.  Colorectal cancer  screening. All adults should have this screening starting at age 80 and continuing until age 65. Your health care provider may recommend screening at age 31 if you are at increased risk. You will have tests every 1-10 years, depending on your results and the type of screening test.  Diabetes screening. This is done by checking your blood sugar (glucose) after you have not eaten for a while (fasting). You may have this done every 1-3 years.  Mammogram. This may be done every 1-2 years. Talk with your health care provider about when you should start having regular mammograms. This may depend on whether you have a family history of breast cancer.  BRCA-related cancer screening. This may be done if you have a family history of breast, ovarian, tubal, or peritoneal cancers.  Pelvic exam and Pap test. This may be done every 3 years starting at age 54. Starting at age 55, this may be done every 5 years if you have a Pap test in combination with an HPV test. Other tests  Sexually transmitted disease (STD) testing.  Bone density scan. This is done to screen for osteoporosis. You may have this scan if you are at high risk for osteoporosis. Follow these instructions at home: Eating and drinking  Eat a diet that includes fresh fruits and vegetables, whole grains, lean protein, and low-fat dairy.  Take vitamin and mineral supplements as recommended by your health care provider.  Do not drink alcohol if: ? Your health care provider tells you not to drink. ? You are pregnant, may be pregnant, or are planning to become pregnant.  If you drink alcohol: ? Limit how much you have to 0-1 drink a day. ? Be aware of how much alcohol is in your drink. In the [place] one drink equals one 12 oz bottle of beer (355 mL), one 5 oz glass of wine (148 mL), or one 1 oz glass of hard liquor (44 mL). Lifestyle  Take daily care of your teeth and gums.  Stay active. Exercise for at least 30 minutes on 5 or more days  each week.  Do not use any products that contain nicotine or tobacco, such as cigarettes, e-cigarettes, and chewing tobacco. If you need help quitting, ask your health care provider.  If you are sexually active, practice safe sex. Use a condom or other form of birth control (contraception) in order to prevent pregnancy and STIs (sexually transmitted infections).  If told by your health care provider, take low-dose aspirin daily starting at age 50. What's next?  Visit your health care provider once a year for a well check visit.  Ask your health care provider how often you should have your eyes and teeth checked.  Stay up to date on all vaccines. This information is not intended to replace advice given to you by your health care provider. Make sure you discuss any questions you have with your health care provider. Document Revised: 10/10/2017 Document Reviewed: 10/10/2017 Elsevier Patient Education  2020 Elsevier  Inc.

## 2019-12-15 NOTE — Assessment & Plan Note (Signed)
Tramadol seems to be helping.  We will also continue Lyrica 150 mg twice daily.

## 2019-12-15 NOTE — Assessment & Plan Note (Signed)
Check lipids.  Continue Crestor 40 mg daily. 

## 2019-12-15 NOTE — Assessment & Plan Note (Signed)
Overall stable but uncontrolled.  Continue clonazepam, Trintellix, and Seroquel.  Will be following up with therapist and psychiatrist seen.

## 2019-12-15 NOTE — Assessment & Plan Note (Signed)
Check A1c.  Continue Metformin 1000 mg daily.

## 2019-12-15 NOTE — Assessment & Plan Note (Signed)
Stable on current medications.  She is on Trintellix 10 mg daily, Rexulti 0.5 mg daily, and Seroquel 50 mg daily.  She will be following up with psych doctor soon.

## 2019-12-16 LAB — COMPREHENSIVE METABOLIC PANEL
AG Ratio: 1.8 (calc) (ref 1.0–2.5)
ALT: 13 U/L (ref 6–29)
AST: 13 U/L (ref 10–30)
Albumin: 4.2 g/dL (ref 3.6–5.1)
Alkaline phosphatase (APISO): 80 U/L (ref 31–125)
BUN: 21 mg/dL (ref 7–25)
CO2: 21 mmol/L (ref 20–32)
Calcium: 8.9 mg/dL (ref 8.6–10.2)
Chloride: 107 mmol/L (ref 98–110)
Creat: 1.05 mg/dL (ref 0.50–1.10)
Globulin: 2.4 g/dL (calc) (ref 1.9–3.7)
Glucose, Bld: 150 mg/dL — ABNORMAL HIGH (ref 65–99)
Potassium: 4.5 mmol/L (ref 3.5–5.3)
Sodium: 138 mmol/L (ref 135–146)
Total Bilirubin: 0.4 mg/dL (ref 0.2–1.2)
Total Protein: 6.6 g/dL (ref 6.1–8.1)

## 2019-12-16 LAB — MICROALBUMIN / CREATININE URINE RATIO
Creatinine, Urine: 173 mg/dL (ref 20–275)
Microalb Creat Ratio: 14 mcg/mg creat (ref <30)
Microalb, Ur: 2.4 mg/dL

## 2019-12-16 LAB — HEMOGLOBIN A1C
Hgb A1c MFr Bld: 7.3 % of total Hgb — ABNORMAL HIGH (ref <5.7)
Mean Plasma Glucose: 163 (calc)
eAG (mmol/L): 9 (calc)

## 2019-12-16 LAB — CBC
HCT: 37.9 % (ref 35.0–45.0)
Hemoglobin: 12.8 g/dL (ref 11.7–15.5)
MCH: 27.2 pg (ref 27.0–33.0)
MCHC: 33.8 g/dL (ref 32.0–36.0)
MCV: 80.6 fL (ref 80.0–100.0)
MPV: 8.7 fL (ref 7.5–12.5)
Platelets: 270 10*3/uL (ref 140–400)
RBC: 4.7 10*6/uL (ref 3.80–5.10)
RDW: 12.4 % (ref 11.0–15.0)
WBC: 11.6 10*3/uL — ABNORMAL HIGH (ref 3.8–10.8)

## 2019-12-16 LAB — LIPID PANEL
Cholesterol: 117 mg/dL (ref <200)
HDL: 36 mg/dL — ABNORMAL LOW (ref >=50)
LDL Cholesterol (Calc): 58 mg/dL (calc)
Non-HDL Cholesterol (Calc): 81 mg/dL (calc) (ref <130)
Total CHOL/HDL Ratio: 3.3 (calc) (ref <5.0)
Triglycerides: 150 mg/dL — ABNORMAL HIGH (ref <150)

## 2019-12-16 LAB — TSH: TSH: 0.94 mIU/L

## 2019-12-16 NOTE — Progress Notes (Signed)
Please inform patient of the following:  Blood sugar up but everything else is NORMAl. Would like for her to really focus on diet and exercise the next few months and we should recheck her blood sugar in 3-6 months.  Heidi Holland. [name], MD 12/16/2019 11:52 AM

## 2019-12-17 ENCOUNTER — Other Ambulatory Visit: Payer: Self-pay | Admitting: *Deleted

## 2019-12-17 DIAGNOSIS — F3181 Bipolar II disorder: Secondary | ICD-10-CM | POA: Diagnosis not present

## 2019-12-17 DIAGNOSIS — F329 Major depressive disorder, single episode, unspecified: Secondary | ICD-10-CM | POA: Diagnosis not present

## 2019-12-17 DIAGNOSIS — F419 Anxiety disorder, unspecified: Secondary | ICD-10-CM | POA: Diagnosis not present

## 2019-12-17 DIAGNOSIS — F4310 Post-traumatic stress disorder, unspecified: Secondary | ICD-10-CM | POA: Diagnosis not present

## 2019-12-17 DIAGNOSIS — M2550 Pain in unspecified joint: Secondary | ICD-10-CM

## 2019-12-17 NOTE — Progress Notes (Signed)
t

## 2019-12-17 NOTE — Progress Notes (Signed)
Referral placed.

## 2019-12-17 NOTE — Progress Notes (Signed)
11/4- set up account with [facility] and gave verbal script for Botox.

## 2019-12-18 ENCOUNTER — Other Ambulatory Visit: Payer: Self-pay | Admitting: Family Medicine

## 2019-12-18 NOTE — Telephone Encounter (Signed)
Pt requesting refills.

## 2019-12-24 DIAGNOSIS — F3181 Bipolar II disorder: Secondary | ICD-10-CM | POA: Diagnosis not present

## 2019-12-24 DIAGNOSIS — F419 Anxiety disorder, unspecified: Secondary | ICD-10-CM | POA: Diagnosis not present

## 2019-12-24 DIAGNOSIS — F4310 Post-traumatic stress disorder, unspecified: Secondary | ICD-10-CM | POA: Diagnosis not present

## 2019-12-24 DIAGNOSIS — F329 Major depressive disorder, single episode, unspecified: Secondary | ICD-10-CM | POA: Diagnosis not present

## 2019-12-25 DIAGNOSIS — F3131 Bipolar disorder, current episode depressed, mild: Secondary | ICD-10-CM | POA: Diagnosis not present

## 2019-12-25 DIAGNOSIS — F411 Generalized anxiety disorder: Secondary | ICD-10-CM | POA: Diagnosis not present

## 2019-12-25 DIAGNOSIS — F4312 Post-traumatic stress disorder, chronic: Secondary | ICD-10-CM | POA: Diagnosis not present

## 2019-12-29 DIAGNOSIS — G43709 Chronic migraine without aura, not intractable, without status migrainosus: Secondary | ICD-10-CM | POA: Diagnosis not present

## 2019-12-31 ENCOUNTER — Other Ambulatory Visit: Payer: Self-pay | Admitting: Family Medicine

## 2019-12-31 DIAGNOSIS — F411 Generalized anxiety disorder: Secondary | ICD-10-CM | POA: Diagnosis not present

## 2019-12-31 DIAGNOSIS — F1290 Cannabis use, unspecified, uncomplicated: Secondary | ICD-10-CM | POA: Diagnosis not present

## 2019-12-31 DIAGNOSIS — F3131 Bipolar disorder, current episode depressed, mild: Secondary | ICD-10-CM | POA: Diagnosis not present

## 2019-12-31 DIAGNOSIS — F4312 Post-traumatic stress disorder, chronic: Secondary | ICD-10-CM | POA: Diagnosis not present

## 2020-01-06 ENCOUNTER — Other Ambulatory Visit: Payer: Self-pay | Admitting: Family Medicine

## 2020-01-13 DIAGNOSIS — F411 Generalized anxiety disorder: Secondary | ICD-10-CM | POA: Diagnosis not present

## 2020-01-13 DIAGNOSIS — F4312 Post-traumatic stress disorder, chronic: Secondary | ICD-10-CM | POA: Diagnosis not present

## 2020-01-13 DIAGNOSIS — F3131 Bipolar disorder, current episode depressed, mild: Secondary | ICD-10-CM | POA: Diagnosis not present

## 2020-01-13 DIAGNOSIS — F1290 Cannabis use, unspecified, uncomplicated: Secondary | ICD-10-CM | POA: Diagnosis not present

## 2020-01-22 ENCOUNTER — Other Ambulatory Visit: Payer: Self-pay

## 2020-01-22 ENCOUNTER — Ambulatory Visit (INDEPENDENT_AMBULATORY_CARE_PROVIDER_SITE_OTHER): Payer: BC Managed Care – PPO | Admitting: Neurology

## 2020-01-22 DIAGNOSIS — F411 Generalized anxiety disorder: Secondary | ICD-10-CM | POA: Diagnosis not present

## 2020-01-22 DIAGNOSIS — F4312 Post-traumatic stress disorder, chronic: Secondary | ICD-10-CM | POA: Diagnosis not present

## 2020-01-22 DIAGNOSIS — G43709 Chronic migraine without aura, not intractable, without status migrainosus: Secondary | ICD-10-CM | POA: Diagnosis not present

## 2020-01-22 DIAGNOSIS — F3131 Bipolar disorder, current episode depressed, mild: Secondary | ICD-10-CM | POA: Diagnosis not present

## 2020-01-22 MED ORDER — ONABOTULINUMTOXINA 100 UNITS IJ SOLR
200.0000 [IU] | Freq: Once | INTRAMUSCULAR | Status: AC
  Administered 2020-01-22: 155 [IU] via INTRAMUSCULAR

## 2020-01-22 NOTE — Progress Notes (Signed)
[facility]  ° °Procedure Note Botox ° °Attending: Dr. Chaniya Genter ° °Preoperative Diagnosis(es): Chronic migraine ° °Consent obtained from: The patient °Benefits discussed included, but were not limited to decreased muscle tightness, increased joint range of motion, and decreased pain.  Risk discussed included, but were not limited pain and discomfort, bleeding, bruising, excessive weakness, venous thrombosis, muscle atrophy and dysphagia.  Anticipated outcomes of the procedure as well as he risks and benefits of the alternatives to the procedure, and the roles and tasks of the personnel to be involved, were discussed with the patient, and the patient consents to the procedure and agrees to proceed. A copy of the patient medication guide was given to the patient which explains the blackbox warning. ° °Patients identity and treatment sites confirmed Yes.  . ° °Details of Procedure: °Skin was cleaned with alcohol. Prior to injection, the needle plunger was aspirated to make sure the needle was not within a blood vessel.  There was no blood retrieved on aspiration.   ° °Following is a summary of the muscles injected  And the amount of Botulinum toxin used: ° °Dilution °200 units of Botox was reconstituted with 4 ml of preservative free normal saline. °Time of reconstitution: At the time of the office visit (<30 minutes prior to injection)  ° °Injections  °155 total units of Botox was injected with a 30 gauge needle. ° °Injection Sites: °L occipitalis: 15 units- 3 sites  °R occiptalis: 15 units- 3 sites ° °L upper trapezius: 15 units- 3 sites °R upper trapezius: 15 units- 3 sits          °L paraspinal: 10 units- 2 sites °R paraspinal: 10 units- 2 sites ° °Face °L frontalis(2 injection sites):10 units   °R frontalis(2 injection sites):10 units         °L corrugator: 5 units   °R corrugator: 5 units           °Procerus: 5 units   °L temporalis: 20 units °R temporalis: 20 units  ° °Agent:  °200 units of botulinum Type  A (Onobotulinum Toxin type A) was reconstituted with 4 ml of preservative free normal saline.  °Time of reconstitution: At the time of the office visit (<30 minutes prior to injection)  ° ° ° Total injected (Units):  155 ° Total wasted (Units):  45 ° °Patient tolerated procedure well without complications.   °Reinjection is anticipated in 3 months. ° ° °

## 2020-01-27 ENCOUNTER — Other Ambulatory Visit: Payer: Self-pay | Admitting: Family Medicine

## 2020-01-28 ENCOUNTER — Other Ambulatory Visit: Payer: Self-pay | Admitting: Family Medicine

## 2020-01-28 DIAGNOSIS — F320 Major depressive disorder, single episode, mild: Secondary | ICD-10-CM | POA: Diagnosis not present

## 2020-01-28 DIAGNOSIS — F411 Generalized anxiety disorder: Secondary | ICD-10-CM | POA: Diagnosis not present

## 2020-01-28 DIAGNOSIS — F3131 Bipolar disorder, current episode depressed, mild: Secondary | ICD-10-CM | POA: Diagnosis not present

## 2020-01-28 DIAGNOSIS — F4312 Post-traumatic stress disorder, chronic: Secondary | ICD-10-CM | POA: Diagnosis not present

## 2020-02-01 ENCOUNTER — Telehealth: Payer: Self-pay

## 2020-02-01 ENCOUNTER — Other Ambulatory Visit: Payer: Self-pay | Admitting: Family Medicine

## 2020-02-01 MED ORDER — TRAMADOL HCL 50 MG PO TABS: 50.0000 mg | ORAL_TABLET | Freq: Three times a day (TID) | ORAL | 5 refills | Status: DC | PRN

## 2020-02-01 NOTE — Telephone Encounter (Signed)
MEDICATION: traMADol HCl 50 MG TAKE 1 TABLET (50 MG TOTAL) BY MOUTH EVERY 8 (EIGHT) HOURS AS NEEDED FOR UP TO 5 DAYS.    [facility]ACY[facility] [address] [place], [place] - [address] Phone:  [phone]  Fax:  [phone]       Comments:   **Let patient know to contact pharmacy at the end of the day to make sure medication is ready. **  ** Please notify patient to allow 48-72 hours to process**  **Encourage patient to contact the pharmacy for refills or they can request refills through [facility]**

## 2020-02-01 NOTE — Telephone Encounter (Signed)
Pt request refill.

## 2020-02-04 DIAGNOSIS — F4310 Post-traumatic stress disorder, unspecified: Secondary | ICD-10-CM | POA: Diagnosis not present

## 2020-02-12 ENCOUNTER — Other Ambulatory Visit: Payer: Self-pay | Admitting: Family Medicine

## 2020-02-14 ENCOUNTER — Other Ambulatory Visit: Payer: Self-pay | Admitting: Family Medicine

## 2020-02-14 DIAGNOSIS — G43709 Chronic migraine without aura, not intractable, without status migrainosus: Secondary | ICD-10-CM

## 2020-02-15 NOTE — Telephone Encounter (Signed)
LAST APPOINTMENT DATE: 12/15/2019   NEXT APPOINTMENT DATE: Visit date not found    LAST REFILL:  12/18/2019  QTY: 60 Ref 1

## 2020-02-17 DIAGNOSIS — F4310 Post-traumatic stress disorder, unspecified: Secondary | ICD-10-CM | POA: Diagnosis not present

## 2020-02-17 DIAGNOSIS — F411 Generalized anxiety disorder: Secondary | ICD-10-CM | POA: Diagnosis not present

## 2020-02-17 DIAGNOSIS — F331 Major depressive disorder, recurrent, moderate: Secondary | ICD-10-CM | POA: Diagnosis not present

## 2020-02-17 DIAGNOSIS — F900 Attention-deficit hyperactivity disorder, predominantly inattentive type: Secondary | ICD-10-CM | POA: Diagnosis not present

## 2020-02-18 DIAGNOSIS — U071 COVID-19: Secondary | ICD-10-CM | POA: Diagnosis not present

## 2020-02-26 ENCOUNTER — Other Ambulatory Visit: Payer: Self-pay

## 2020-02-26 DIAGNOSIS — F3131 Bipolar disorder, current episode depressed, mild: Secondary | ICD-10-CM | POA: Diagnosis not present

## 2020-02-26 DIAGNOSIS — F411 Generalized anxiety disorder: Secondary | ICD-10-CM | POA: Diagnosis not present

## 2020-02-26 DIAGNOSIS — F4312 Post-traumatic stress disorder, chronic: Secondary | ICD-10-CM | POA: Diagnosis not present

## 2020-02-26 NOTE — Telephone Encounter (Signed)
MEDICATION: pregabalin (LYRICA) 150 MG capsule     hydrOXYzine (ATARAX/VISTARIL) 50 MG tablet  PHARMACY: [facility] [address]  Comments:   **Let patient know to contact pharmacy at the end of the day to make sure medication is ready. **  ** Please notify patient to allow 48-72 hours to process**  **Encourage patient to contact the pharmacy for refills or they can request refills through [facility]**

## 2020-02-29 NOTE — Telephone Encounter (Signed)
Pt requesting refills.

## 2020-03-01 ENCOUNTER — Other Ambulatory Visit: Payer: Self-pay

## 2020-03-01 ENCOUNTER — Ambulatory Visit (HOSPITAL_COMMUNITY)
Admission: EM | Admit: 2020-03-01 | Discharge: 2020-03-01 | Disposition: A | Discharge status: Home / Self Care | Payer: BC Managed Care – PPO | Attending: Student | Admitting: Student

## 2020-03-01 ENCOUNTER — Encounter (HOSPITAL_COMMUNITY): Payer: Self-pay | Admitting: Emergency Medicine

## 2020-03-01 DIAGNOSIS — J301 Allergic rhinitis due to pollen: Secondary | ICD-10-CM

## 2020-03-01 DIAGNOSIS — Z20822 Contact with and (suspected) exposure to covid-19: Secondary | ICD-10-CM | POA: Diagnosis not present

## 2020-03-01 DIAGNOSIS — J069 Acute upper respiratory infection, unspecified: Secondary | ICD-10-CM | POA: Insufficient documentation

## 2020-03-01 DIAGNOSIS — J0100 Acute maxillary sinusitis, unspecified: Secondary | ICD-10-CM | POA: Insufficient documentation

## 2020-03-01 DIAGNOSIS — H8111 Benign paroxysmal vertigo, right ear: Secondary | ICD-10-CM | POA: Diagnosis not present

## 2020-03-01 MED ORDER — HYDROXYZINE HCL 50 MG PO TABS: 25.0000 mg | ORAL_TABLET | Freq: Every evening | ORAL | 1 refills | Status: DC | PRN

## 2020-03-01 MED ORDER — MECLIZINE HCL 25 MG PO TABS: 25.0000 mg | ORAL_TABLET | Freq: Three times a day (TID) | ORAL | 0 refills | Status: DC | PRN

## 2020-03-01 MED ORDER — PREGABALIN 150 MG PO CAPS: 150.0000 mg | ORAL_CAPSULE | Freq: Two times a day (BID) | ORAL | 5 refills | Status: DC

## 2020-03-01 MED ORDER — AMOXICILLIN-POT CLAVULANATE 875-125 MG PO TABS: 1.0000 | ORAL_TABLET | Freq: Two times a day (BID) | ORAL | 0 refills | Status: DC

## 2020-03-01 MED ORDER — CETIRIZINE HCL 10 MG PO TABS: 10.0000 mg | ORAL_TABLET | Freq: Every day | ORAL | 2 refills | Status: DC

## 2020-03-01 NOTE — ED Provider Notes (Signed)
Heidi Holland    CSN: [id number] Arrival date & time: 03/01/20  1926      History   Chief Complaint Chief Complaint  Patient presents with  . URI    HPI Heidi Holland is a 45 y.o. female presenting with two weeks of cold symptoms, including headaches, dizziness, congestion, nausea. OTC decongestants providing no reilef. States she's had symptoms for 2 weeks, getting better on their own. However states she's had dizziness when going from sitting to standing that's not improving on its own. Denies hearing changes, tinnitus, headaches. Some nausea with dizziness. Describes the dizziness as sensation of room spinning. Also states she has facial pressure and congestion that won't go away. Also requiring negative covid test to return to work.  HPI  Past Medical History:  Diagnosis Date  . Allergy   . Anxiety   . Asthma   . Depression   . Diabetes mellitus without complication ([place])   . Diverticulitis   . GERD (gastroesophageal reflux disease)   . HPV in female   . Hyperlipidemia   . Migraines     Patient Active Problem List   Diagnosis Date Noted  . Status post left hip replacement 03/12/2019  . T2DM (type 2 diabetes mellitus) ([place]) 07/31/2018  . Dyslipidemia associated with type 2 diabetes mellitus ([place]) 07/31/2018  . Allergic rhinitis 07/31/2018  . Mild intermittent asthma, uncomplicated 61/60/7371  . GERD (gastroesophageal reflux disease) 07/31/2018  . S/P gastric bypass 07/31/2018  . Migraine 07/31/2018  . Anxiety 07/31/2018  . Depression, major, single episode, complete remission ([place]) 07/31/2018  . Insomnia 07/31/2018  . Umbilical hernia 08/08/9483  . Iron deficiency anemia 06/09/2018  . Cervicogenic headache 05/12/2018  . Low back pain 05/12/2018  . Polyarthralgia 05/12/2018    Past Surgical History:  Procedure Laterality Date  . BOWEL RESECTION    . gasticbypass    . GASTRIC BYPASS    . KNEE ARTHROSCOPY    . TOTAL HIP ARTHROPLASTY Left     OB  History   No obstetric history on file.      Home Medications    Prior to Admission medications   Medication Sig Start Date End Date Taking? Authorizing Provider  amoxicillin-clavulanate (AUGMENTIN) 875-125 MG tablet Take 1 tablet by mouth every 12 (twelve) hours. 03/01/20  Yes Hazel Sams, PA-C  cetirizine (ZYRTEC ALLERGY) 10 MG tablet Take 1 tablet (10 mg total) by mouth daily. 03/01/20  Yes Hazel Sams, PA-C  meclizine (ANTIVERT) 25 MG tablet Take 1 tablet (25 mg total) by mouth 3 (three) times daily as needed for dizziness. 03/01/20  Yes Hazel Sams, PA-C  acetaminophen (TYLENOL) 325 MG tablet Take 650 mg by mouth every 6 (six) hours as needed for mild pain.    [provider]  albuterol (VENTOLIN HFA) 108 (90 Base) MCG/ACT inhaler INHALE 2 PUFFS BY MOUTH EVERY 6 HOURS AS NEEDED FOR WHEEZE OR SHORTNESS OF BREATH 01/06/20   Vivi Barrack, MD  blood glucose meter kit and supplies KIT Dispense based on patient and insurance preference. Use up to four times daily as directed. (FOR ICD-9 250.00, 250.01). 07/31/18   Vivi Barrack, MD  celecoxib (CELEBREX) 100 MG capsule TAKE 1 CAPSULE BY MOUTH TWICE A DAY 01/27/20   Vivi Barrack, MD  clonazePAM (KLONOPIN) 0.5 MG tablet TAKE 1 TABLET BY MOUTH 2 TIMES DAILY AS NEEDED FOR ANXIETY. 02/15/20   Vivi Barrack, MD  Continuous Blood Gluc Receiver (FREESTYLE LIBRE READER) DEVI 1  Device by Does not apply route daily as needed (to check blood sugar). 08/14/18   Vivi Barrack, MD  Continuous Blood Gluc Sensor (FREESTYLE LIBRE SENSOR SYSTEM) MISC Apply 1 sensor to skin every 14 days. 08/14/18   Vivi Barrack, MD  Erenumab-aooe (AIMOVIG) 70 MG/ML SOAJ Inject 70 mg into the skin every 30 (thirty) days. 05/06/19   Tomi Likens, Adam R, DO  fluticasone (FLONASE) 50 MCG/ACT nasal spray Place 2 sprays into both nostrils daily for 7 days. 09/08/19 09/15/19  Darr, Edison Nasuti, PA-C  hydrOXYzine (ATARAX/VISTARIL) 50 MG tablet Take 0.5-2 tablets (25-100 mg total)  by mouth at bedtime as needed for anxiety. 03/01/20   Vivi Barrack, MD  ipratropium (ATROVENT) 0.06 % nasal spray Place 2 sprays into both nostrils 4 (four) times daily. 07/20/19   Vivi Barrack, MD  IRON-VITAMIN C PO Take by mouth.    [provider]  lamoTRIgine (LAMICTAL) 200 MG tablet Take 200 mg by mouth.  07/09/19   [provider]  metFORMIN (GLUCOPHAGE) 1000 MG tablet TAKE 1 TABLET BY MOUTH TWICE A DAY WITH A MEAL 12/31/19   Vivi Barrack, MD  ondansetron (ZOFRAN-ODT) 4 MG disintegrating tablet DISSOLVE 1 TABLET ON TONGUE EVERY 8 HOURS AS NEEDED FOR NAUSEA AND VOMITING 02/15/20   Vivi Barrack, MD  pantoprazole (PROTONIX) 40 MG tablet TAKE 1 TABLET BY MOUTH EVERY DAY IN THE EVENING 06/09/19   Vivi Barrack, MD  pregabalin (LYRICA) 150 MG capsule Take 1 capsule (150 mg total) by mouth 2 (two) times daily. 03/01/20   Vivi Barrack, MD  QUEtiapine (SEROQUEL) 50 MG tablet Take 50 mg by mouth at bedtime. 07/29/19   [provider]  REXULTI 0.5 MG TABS Take 1 tablet by mouth daily. 04/17/18   [provider]  rosuvastatin (CRESTOR) 40 MG tablet Take 1 tablet (40 mg total) by mouth daily. 11/16/19   Vivi Barrack, MD  sodium chloride (OCEAN) 0.65 % SOLN nasal spray Place 1 spray into both nostrils as needed for congestion. 09/08/19   Darr, Edison Nasuti, PA-C  tiZANidine (ZANAFLEX) 4 MG tablet TAKE ONE TABLET BY MOUTH THREE TIMES DAILY 02/02/20   Vivi Barrack, MD  TOSYMRA 10 MG/ACT SOLN PLACE 1 SPRAY INTO THE NOSE EVERY HOUR AS NEEDED (MAXIMUM 3 SPRAYS IN 24 HOURS) 11/16/19   Tomi Likens, Adam R, DO  traMADol (ULTRAM) 50 MG tablet Take 1 tablet (50 mg total) by mouth every 8 (eight) hours as needed. 02/01/20   Vivi Barrack, MD  Vitamin D, Ergocalciferol, (DRISDOL) 1.25 MG (50000 UNIT) CAPS capsule TAKE 1 CAPSULE BY MOUTH EVERY 7 DAYS 05/28/19   Lyndal Pulley, DO  vortioxetine HBr (TRINTELLIX) 10 MG TABS tablet Take 10 mg by mouth daily.    [provider]   zolpidem (AMBIEN) 5 MG tablet Take 1 tablet (5 mg total) by mouth at bedtime as needed for sleep. 10/23/19   Vivi Barrack, MD    Family History Family History  Problem Relation Age of Onset  . Heart disease Mother   . Lung cancer Mother   . Rheum arthritis Mother   . Arthritis Mother   . Cancer Mother   . COPD Mother   . Miscarriages / [place] Mother   . Diabetes Father   . Heart disease Father   . Breast cancer Paternal Aunt     Social History Social History   Tobacco Use  . Smoking status: Never Smoker  . Smokeless tobacco: Never  Used  Vaping Use  . Vaping Use: Never used  Substance Use Topics  . Alcohol use: Yes    Comment: Occ  . Drug use: Yes    Types: Marijuana    Comment: THC     Allergies   Ciprofloxacin   Review of Systems Review of Systems  Constitutional: Negative for appetite change, chills and fever.  HENT: Positive for congestion and sinus pressure. Negative for ear pain, rhinorrhea, sinus pain and sore throat.   Eyes: Negative for redness and visual disturbance.  Respiratory: Negative for cough, chest tightness, shortness of breath and wheezing.   Cardiovascular: Negative for chest pain and palpitations.  Gastrointestinal: Negative for abdominal pain, constipation, diarrhea, nausea and vomiting.  Genitourinary: Negative for dysuria, frequency and urgency.  Musculoskeletal: Negative for myalgias.  Neurological: Positive for dizziness. Negative for weakness and headaches.  Psychiatric/Behavioral: Negative for confusion.  All other systems reviewed and are negative.    Physical Exam Triage Vital Signs ED Triage Vitals  Enc Vitals Group     BP 03/01/20 1944 119/83     Pulse Rate 03/01/20 1944 86     Resp 03/01/20 1944 20     Temp 03/01/20 1944 98.7 F (37.1 C)     Temp Source 03/01/20 1944 Oral     SpO2 03/01/20 1944 96 %     Weight --      Height --      Head Circumference --      Peak Flow --      Pain Score 03/01/20 1945 0      Pain Loc --      Pain Edu? --      Excl. in [place]? --    No data found.  Updated Vital Signs BP 119/83 (BP Location: Left Arm)   Pulse 86   Temp 98.7 F (37.1 C) (Oral)   Resp 20   LMP 02/09/2020   SpO2 96%   Visual Acuity Right Eye Distance:   Left Eye Distance:   Bilateral Distance:    Right Eye Near:   Left Eye Near:    Bilateral Near:     Physical Exam Vitals reviewed.  Constitutional:      General: She is not in acute distress.    Appearance: Normal appearance. She is not ill-appearing.  HENT:     Head: Normocephalic and atraumatic.     Right Ear: Hearing, tympanic membrane, ear canal and external ear normal. No swelling or tenderness. There is no impacted cerumen. No mastoid tenderness. Tympanic membrane is not perforated, erythematous, retracted or bulging.     Left Ear: Hearing, tympanic membrane, ear canal and external ear normal. No swelling or tenderness. There is no impacted cerumen. No mastoid tenderness. Tympanic membrane is not perforated, erythematous, retracted or bulging.     Nose:     Right Sinus: Maxillary sinus tenderness present. No frontal sinus tenderness.     Left Sinus: Maxillary sinus tenderness present. No frontal sinus tenderness.     Mouth/Throat:     Mouth: Mucous membranes are moist.     Pharynx: Uvula midline. No oropharyngeal exudate or posterior oropharyngeal erythema.     Tonsils: No tonsillar exudate.  Cardiovascular:     Rate and Rhythm: Normal rate and regular rhythm.     Heart sounds: Normal heart sounds.  Pulmonary:     Effort: Pulmonary effort is normal.     Breath sounds: Normal breath sounds and air entry. No wheezing, rhonchi or rales.  Chest:  Chest wall: No tenderness.  Abdominal:     General: Abdomen is flat. Bowel sounds are normal.     Tenderness: There is no abdominal tenderness. There is no guarding or rebound.  Musculoskeletal:     Cervical back: Normal range of motion and neck supple. No rigidity.   Lymphadenopathy:     Cervical: No cervical adenopathy.  Neurological:     General: No focal deficit present.     Mental Status: She is alert and oriented to person, place, and time. Mental status is at baseline.     Cranial Nerves: Cranial nerves are intact. No cranial nerve deficit or facial asymmetry.     Sensory: Sensation is intact. No sensory deficit.     Motor: Motor function is intact. No weakness.     Coordination: Coordination is intact. Coordination normal.     Gait: Gait is intact. Gait normal.     Comments: CN 2-12 intact. No weakness or numbness in UEs or LEs.  Psychiatric:        Attention and Perception: Attention and perception normal.        Mood and Affect: Mood and affect normal.        Behavior: Behavior normal. Behavior is cooperative.        Thought Content: Thought content normal.        Judgment: Judgment normal.      UC Treatments / Results  Labs (all labs ordered are listed, but only abnormal results are displayed) Labs Reviewed  SARS CORONAVIRUS 2 (TAT 6-24 HRS)    EKG   Radiology No results found.  Procedures Procedures (including critical care time)  Medications Ordered in UC Medications - No data to display  Initial Impression / Assessment and Plan / UC Course  I have reviewed the triage vital signs and the nursing notes.  Pertinent labs & imaging results that were available during my care of the patient were reviewed by me and considered in my medical decision making (see chart for details).     -Augmentin for sinus infection, twice daily for 7 days  -Zyrtec for seasonal allergies, once a day for 1-2 weeks (continue if this helps your symptoms) -Meclizine up to 3x daily for dizziness. This can make you drowsy, so take at night -Follow-up with ear nose and throat doctor if symptoms worsen or persist despite treatment. Information provided.   Covid test sent today. Isolation precautions per CDC guidelines until negative result.  Symptomatic relief with OTC Mucinex, Nyquil, etc. Return precautions- new/worsening fevers/chills, shortness of breath, chest pain, abd pain, etc.   Final Clinical Impressions(s) / UC Diagnoses   Final diagnoses:  Viral upper respiratory tract infection  Allergic rhinitis due to pollen, unspecified seasonality  Acute non-recurrent maxillary sinusitis  Benign paroxysmal positional vertigo of right ear     Discharge Instructions     -Augmentin for sinus infection, twice daily for 7 days  -Zyrtec for seasonal allergies, once a day for 1-2 weeks (continue if this helps your symptoms) -Meclizine up to 3x daily for dizziness. This can make you drowsy, so take at night -Follow-up with ear nose and throat doctor if symptoms worsen or persist despite treatment  We are currently awaiting result of your PCR covid-19 test. This typically comes back in 1-2 days. We'll call you if the result is positive. Otherwise, the result will be sent electronically to your MyChart. You can also call this clinic and ask for your result via telephone.   Please isolate at  home while awaiting these results. If your test is positive for Covid-19, continue to isolate at home for 5 days if you have mild symptoms, or a total of 10 days from symptom onset if you have more severe symptoms. If you quarantine for a shorter period of time (i.e. 5 days), make sure to wear a mask until day 10 of symptoms. Treat your symptoms at home with OTC remedies like tylenol/ibuprofen, mucinex, nyquil, etc. Seek medical attention if you develop high fevers, chest pain, shortness of breath, ear pain, facial pain, etc. Make sure to get up and move around every 2-3 hours while convalescing to help prevent blood clots. Drink plenty of fluids, and rest as much as possible.     ED Prescriptions    Medication Sig Dispense Auth. Provider   amoxicillin-clavulanate (AUGMENTIN) 875-125 MG tablet Take 1 tablet by mouth every 12 (twelve) hours. 14  tablet Hazel Sams, PA-C   cetirizine (ZYRTEC ALLERGY) 10 MG tablet Take 1 tablet (10 mg total) by mouth daily. 30 tablet Hazel Sams, PA-C   meclizine (ANTIVERT) 25 MG tablet Take 1 tablet (25 mg total) by mouth 3 (three) times daily as needed for dizziness. 30 tablet Hazel Sams, PA-C     PDMP not reviewed this encounter.   Hazel Sams, PA-C 03/01/20 2049

## 2020-03-01 NOTE — ED Triage Notes (Signed)
Pt here for cold sx onset 2 weeks associated w/dizziness, headaches, nasal congestion, nauseas  Denies f/v/d, abd pain  Taking OTC decongestant meds w/no relief.  Reports she took a test at a testing site and it came back inconclusive.   Needing neg test to go back to work.   A&O x4... NAD.Marland Kitchen. ambulatory

## 2020-03-01 NOTE — Discharge Instructions (Addendum)
-  Augmentin for sinus infection, twice daily for 7 days  -Zyrtec for seasonal allergies, once a day for 1-2 weeks (continue if this helps your symptoms) -Meclizine up to 3x daily for dizziness. This can make you drowsy, so take at night -Follow-up with ear nose and throat doctor if symptoms worsen or persist despite treatment  We are currently awaiting result of your PCR covid-19 test. This typically comes back in 1-2 days. We'll call you if the result is positive. Otherwise, the result will be sent electronically to your MyChart. You can also call this clinic and ask for your result via telephone.   Please isolate at home while awaiting these results. If your test is positive for Covid-19, continue to isolate at home for 5 days if you have mild symptoms, or a total of 10 days from symptom onset if you have more severe symptoms. If you quarantine for a shorter period of time (i.e. 5 days), make sure to wear a mask until day 10 of symptoms. Treat your symptoms at home with OTC remedies like tylenol/ibuprofen, mucinex, nyquil, etc. Seek medical attention if you develop high fevers, chest pain, shortness of breath, ear pain, facial pain, etc. Make sure to get up and move around every 2-3 hours while convalescing to help prevent blood clots. Drink plenty of fluids, and rest as much as possible.

## 2020-03-02 LAB — SARS CORONAVIRUS 2 (TAT 6-24 HRS): SARS Coronavirus 2: NEGATIVE

## 2020-03-08 ENCOUNTER — Ambulatory Visit: Payer: BC Managed Care – PPO | Admitting: Physician Assistant

## 2020-03-08 DIAGNOSIS — F1290 Cannabis use, unspecified, uncomplicated: Secondary | ICD-10-CM | POA: Diagnosis not present

## 2020-03-08 DIAGNOSIS — J343 Hypertrophy of nasal turbinates: Secondary | ICD-10-CM | POA: Diagnosis not present

## 2020-03-08 DIAGNOSIS — R42 Dizziness and giddiness: Secondary | ICD-10-CM | POA: Diagnosis not present

## 2020-03-09 DIAGNOSIS — R42 Dizziness and giddiness: Secondary | ICD-10-CM | POA: Insufficient documentation

## 2020-03-11 ENCOUNTER — Other Ambulatory Visit: Payer: Self-pay | Admitting: Family Medicine

## 2020-03-15 ENCOUNTER — Other Ambulatory Visit: Payer: Self-pay | Admitting: Family Medicine

## 2020-03-15 ENCOUNTER — Telehealth: Payer: Self-pay

## 2020-03-15 NOTE — Telephone Encounter (Signed)
See below

## 2020-03-15 NOTE — Telephone Encounter (Signed)
Patient just got off of 7-10 does of antibiotics [place] and would like [place] to call in  something for a yeast infection she had gotten since she started the antibiotics [id number] - [place], [place] - [address]

## 2020-03-16 DIAGNOSIS — F411 Generalized anxiety disorder: Secondary | ICD-10-CM | POA: Diagnosis not present

## 2020-03-16 DIAGNOSIS — F329 Major depressive disorder, single episode, unspecified: Secondary | ICD-10-CM | POA: Diagnosis not present

## 2020-03-16 DIAGNOSIS — F4312 Post-traumatic stress disorder, chronic: Secondary | ICD-10-CM | POA: Diagnosis not present

## 2020-03-16 DIAGNOSIS — F331 Major depressive disorder, recurrent, moderate: Secondary | ICD-10-CM | POA: Diagnosis not present

## 2020-03-16 MED ORDER — FLUCONAZOLE 150 MG PO TABS: 150.0000 mg | ORAL_TABLET | Freq: Once | ORAL | 0 refills | Status: DC

## 2020-03-16 NOTE — Telephone Encounter (Signed)
LAST APPOINTMENT DATE: 12/15/2019   NEXT APPOINTMENT DATE: 03/15/2020    LAST REFILL:  02/02/2020  QTY: 90

## 2020-03-16 NOTE — Telephone Encounter (Signed)
Rx sent 

## 2020-03-16 NOTE — Telephone Encounter (Signed)
Ok to send in single dose of diflucan 150mg  but needs appointment if not improving.  . [name], MD 03/16/2020 10:56 AM

## 2020-03-23 NOTE — Progress Notes (Deleted)
NEUROLOGY FOLLOW UP OFFICE NOTE  Heidi Holland [id number]   Subjective:  Heidi Holland is a 45 year oldright-handed Caucasian woman with hypertension, hyperlipidemia, type 2 diabetes, PTSD/anxiety/depression, chronic low back painand history of TIA who follows up for migraines.  UPDATE: Started Botox In September.  Status post 2 rounds. Intensity:  *** Duration:  *** Frequency:  ***  Current NSAIDS:ASA 360m Current analgesics:none Current triptans:Tosymra Current ergotamine:none Current anti-emetic:none Current muscle relaxants:Tizanidine 414mPRN Current anti-anxiolytic:Clonazepam, hydroxyzine Current sleep aide:trazodone Current Antihypertensive medications:hydroxyzine Current Antidepressant medications:Trintellix, Rexulti Current Anticonvulsant medications:Gabapentin60075mhree times daily Current anti-CGRP:none Current Vitamins/Herbal/Supplements:none Current Antihistamines/Decongestants:none Other therapy:none Hormone/birth control:none Other medications:Seroquel  Caffeine:Caffeine-free soda, 1 cup of coffee 2-3 times a week. Alcohol:no Smoker:no Diet:Needs to improve water intake. Skips meals. Diet Coke daily Exercise:Not routine Depression:Poor. Currently treated; Anxiety:Poor. Currently treated. Other pain:Low back pain/lumbar radiculopathy Sleep hygiene:Improved on medication  HISTORY: Onset:Age 45 Location:occiput into neck or top of head.   Quality:Pressure, throbbing Initial intensity:8/10.Shedenies new headache, thunderclap headache  Aura:no Premonitory Phase:Wakes up with heavy eyelids and stiff neck. Postdrome:"hangover effect" - exhausted, irritable, neck soreness Associated symptoms:Neck stiffness, nausea, sometimes vomiting, photophobia, phonophobia, blurred vision.Shedenies associated unilateral numbness or weakness. Initial duration:Usually 2-3 days (up to a  week with various intensity) Initial frequency:6 times a month (15 days or more a month) Initial frequency of abortive medication:something daily Triggers:Lack of coffee, menstrual period, when low back pain aggravated, perfumes Relieving factors:Ice pack, lay in dark Activity:aggravates She has been to the ED on a couple of occasions for severe intractable migraine.  Past NSAIDS:Ibuprofen, Aleve Past analgesics:Excedrin, Tylenol, Fioricet Past abortive triptans: rizatriptan, sumatriptan 17m22mst abortive ergotamine:none Past muscle relaxants:Flexeril, Robaxin Past anti-emetic:Zofran, Phenergan Past antihypertensive medications:"a blood pressure medication" Past antidepressant medications:Effexor Past anticonvulsant medications:topiramate Past anti-CGRP:Aimovig 70mg63mt vitamins/Herbal/Supplements:none Past antihistamines/decongestants:none Other past therapies:Botox (effective)  She was told by her previous PCP that she had a transient ischemic attack in early 2019. She was walking and her legs suddenly gave out and her arms weren't working. She was on the floor for a few minutes. No unilateral numbness or weakness. When she got up, she called a friend who tested her on the phone for TIA and symptoms resolved. She had an MRI and MRAof the brainabout a month later, on 07/24/17, which report states werenormal. She was given a diagnosis of TIA and has since been on ASA 325mg 27my. Her mother has a history of recurrent TIAs while she was treated for lung cancer.   Family history of headache:no  PAST MEDICAL HISTORY: Past Medical History:  Diagnosis Date  . Allergy   . Anxiety   . Asthma   . Depression   . Diabetes mellitus without complication (HCC)  [place]   . GERD (gastroesophageal reflux disease)   . HPV in female   . Hyperlipidemia   . Migraines     MEDICATIONS: Current Outpatient Medications on File Prior to  Visit  Medication Sig Dispense Refill  . acetaminophen (TYLENOL) 325 MG tablet Take 650 mg by mouth every 6 (six) hours as needed for mild pain.    . albuMarland Kitchenerol (VENTOLIN HFA) 108 (90 Base) MCG/ACT inhaler INHALE 2 PUFFS BY MOUTH EVERY 6 HOURS AS NEEDED FOR WHEEZE OR SHORTNESS OF BREATH 8.5 each 2  . amoxicillin-clavulanate (AUGMENTIN) 875-125 MG tablet Take 1 tablet by mouth every 12 (twelve) hours. 14 tablet 0  . blood glucose meter kit and supplies KIT Dispense based on patient and insurance preference. Use up to four times  daily as directed. (FOR ICD-9 250.00, 250.01). 1 each 0  . celecoxib (CELEBREX) 100 MG capsule TAKE 1 CAPSULE BY MOUTH TWICE A DAY 60 capsule 1  . cetirizine (ZYRTEC ALLERGY) 10 MG tablet Take 1 tablet (10 mg total) by mouth daily. 30 tablet 2  . clonazePAM (KLONOPIN) 0.5 MG tablet TAKE 1 TABLET BY MOUTH 2 TIMES DAILY AS NEEDED FOR ANXIETY. 60 tablet 5  . Continuous Blood Gluc Receiver (FREESTYLE LIBRE READER) DEVI 1 Device by Does not apply route daily as needed (to check blood sugar). 1 Device 0  . Continuous Blood Gluc Sensor (FREESTYLE LIBRE SENSOR SYSTEM) MISC Apply 1 sensor to skin every 14 days. 2 each 11  . Erenumab-aooe (AIMOVIG) 70 MG/ML SOAJ Inject 70 mg into the skin every 30 (thirty) days. 1 pen 5  . fluticasone (FLONASE) 50 MCG/ACT nasal spray Place 2 sprays into both nostrils daily for 7 days. 1 g 0  . hydrOXYzine (ATARAX/VISTARIL) 50 MG tablet Take 0.5-2 tablets (25-100 mg total) by mouth at bedtime as needed for anxiety. 180 tablet 1  . ipratropium (ATROVENT) 0.06 % nasal spray Place 2 sprays into both nostrils 4 (four) times daily. 15 mL 12  . IRON-VITAMIN C PO Take by mouth.    . lamoTRIgine (LAMICTAL) 200 MG tablet Take 200 mg by mouth.     . meclizine (ANTIVERT) 25 MG tablet Take 1 tablet (25 mg total) by mouth 3 (three) times daily as needed for dizziness. 30 tablet 0  . metFORMIN (GLUCOPHAGE) 1000 MG tablet TAKE 1 TABLET BY MOUTH TWICE A DAY WITH A MEAL  180 tablet 3  . ondansetron (ZOFRAN-ODT) 4 MG disintegrating tablet DISSOLVE 1 TABLET ON TONGUE EVERY 8 HOURS AS NEEDED FOR NAUSEA AND VOMITING 20 tablet 1  . pantoprazole (PROTONIX) 40 MG tablet TAKE 1 TABLET BY MOUTH EVERY DAY IN THE EVENING 90 tablet 0  . pregabalin (LYRICA) 150 MG capsule Take 1 capsule (150 mg total) by mouth 2 (two) times daily. 60 capsule 5  . QUEtiapine (SEROQUEL) 50 MG tablet Take 50 mg by mouth at bedtime.    Marland Kitchen REXULTI 0.5 MG TABS Take 1 tablet by mouth daily.    . rosuvastatin (CRESTOR) 40 MG tablet Take 1 tablet (40 mg total) by mouth daily. 90 tablet 1  . sodium chloride (OCEAN) 0.65 % SOLN nasal spray Place 1 spray into both nostrils as needed for congestion. 44 mL 0  . tiZANidine (ZANAFLEX) 4 MG tablet TAKE ONE TABLET BY MOUTH THREE TIMES DAILY 90 tablet 0  . TOSYMRA 10 MG/ACT SOLN PLACE 1 SPRAY INTO THE NOSE EVERY HOUR AS NEEDED (MAXIMUM 3 SPRAYS IN 24 HOURS) 6 each 4  . traMADol (ULTRAM) 50 MG tablet Take 1 tablet (50 mg total) by mouth every 8 (eight) hours as needed. 30 tablet 5  . Vitamin D, Ergocalciferol, (DRISDOL) 1.25 MG (50000 UNIT) CAPS capsule TAKE 1 CAPSULE BY MOUTH EVERY 7 DAYS 12 capsule 0  . vortioxetine HBr (TRINTELLIX) 10 MG TABS tablet Take 10 mg by mouth daily.    Marland Kitchen zolpidem (AMBIEN) 5 MG tablet Take 1 tablet (5 mg total) by mouth at bedtime as needed for sleep. 30 tablet 5   No current facility-administered medications on file prior to visit.    ALLERGIES: Allergies  Allergen Reactions  . Ciprofloxacin Itching    FAMILY HISTORY: Family History  Problem Relation Age of Onset  . Heart disease Mother   . Lung cancer Mother   . Rheum arthritis  Mother   . Arthritis Mother   . Cancer Mother   . COPD Mother   . Miscarriages / [place] Mother   . Diabetes Father   . Heart disease Father   . Breast cancer Paternal Aunt    ***.  SOCIAL HISTORY: Social History   Socioeconomic History  . Marital status: Single    Spouse name:  Not on file  . Number of children: 0  . Years of education: 45  . Highest education level: Associate degree: occupational, Hotel manager, or vocational program  Occupational History    Employer: [employer]  Tobacco Use  . Smoking status: Never Smoker  . Smokeless tobacco: Never Used  Vaping Use  . Vaping Use: Never used  Substance and Sexual Activity  . Alcohol use: Yes    Comment: Occ  . Drug use: Yes    Types: Marijuana    Comment: THC  . Sexual activity: Never  Other Topics Concern  . Not on file  Social History Narrative   Right handed.    Social Determinants of Health   Financial Resource Strain: Not on file  Food Insecurity: Not on file  Transportation Needs: Not on file  Physical Activity: Not on file  Stress: Not on file  Social Connections: Not on file  Intimate Partner Violence: Not on file     Objective:  *** General: No acute distress.  Patient appears ***-groomed.   Head:  Normocephalic/atraumatic Eyes:  Fundi examined but not visualized Neck: supple, no paraspinal tenderness, full range of motion Heart:  Regular rate and rhythm Lungs:  Clear to auscultation bilaterally Back: No paraspinal tenderness Neurological Exam: alert and oriented to person, place, and time. Attention span and concentration intact, recent and remote memory intact, fund of knowledge intact.  Speech fluent and not dysarthric, language intact.  CN II-XII intact. Bulk and tone normal, muscle strength 5/5 throughout.  Sensation to light touch, temperature and vibration intact.  Deep tendon reflexes 2+ throughout, toes downgoing.  Finger to nose and heel to shin testing intact.  Gait normal, Romberg negative.   Assessment/Plan:   ***  Metta Clines, DO  CC:  Dimas Chyle, MD

## 2020-03-24 ENCOUNTER — Ambulatory Visit: Payer: BC Managed Care – PPO | Admitting: Neurology

## 2020-03-25 DIAGNOSIS — F4312 Post-traumatic stress disorder, chronic: Secondary | ICD-10-CM | POA: Diagnosis not present

## 2020-03-25 DIAGNOSIS — F411 Generalized anxiety disorder: Secondary | ICD-10-CM | POA: Diagnosis not present

## 2020-03-25 DIAGNOSIS — F3131 Bipolar disorder, current episode depressed, mild: Secondary | ICD-10-CM | POA: Diagnosis not present

## 2020-03-25 NOTE — Progress Notes (Signed)
Sukari Tang Key: [id number] help? Call us at [phone] Outcome Approvedtoday Effective from 03/29/2020 through 03/28/2021. Drug Botox 200UNIT solution Form [employer] Benefit Electronic Request Form (CB)

## 2020-04-05 DIAGNOSIS — F411 Generalized anxiety disorder: Secondary | ICD-10-CM | POA: Diagnosis not present

## 2020-04-05 DIAGNOSIS — F4312 Post-traumatic stress disorder, chronic: Secondary | ICD-10-CM | POA: Diagnosis not present

## 2020-04-05 DIAGNOSIS — F3131 Bipolar disorder, current episode depressed, mild: Secondary | ICD-10-CM | POA: Diagnosis not present

## 2020-04-06 DIAGNOSIS — F329 Major depressive disorder, single episode, unspecified: Secondary | ICD-10-CM | POA: Diagnosis not present

## 2020-04-06 DIAGNOSIS — F4312 Post-traumatic stress disorder, chronic: Secondary | ICD-10-CM | POA: Diagnosis not present

## 2020-04-06 DIAGNOSIS — F411 Generalized anxiety disorder: Secondary | ICD-10-CM | POA: Diagnosis not present

## 2020-04-06 DIAGNOSIS — F331 Major depressive disorder, recurrent, moderate: Secondary | ICD-10-CM | POA: Diagnosis not present

## 2020-04-07 ENCOUNTER — Other Ambulatory Visit: Payer: Self-pay | Admitting: Family Medicine

## 2020-04-08 NOTE — Telephone Encounter (Signed)
LAST APPOINTMENT DATE: [id number]   NEXT APPOINTMENT DATE: Visit date not found

## 2020-04-14 ENCOUNTER — Other Ambulatory Visit: Payer: Self-pay | Admitting: Family Medicine

## 2020-04-19 ENCOUNTER — Ambulatory Visit: Payer: BC Managed Care – PPO | Admitting: Family Medicine

## 2020-04-19 ENCOUNTER — Other Ambulatory Visit: Payer: Self-pay | Admitting: Family Medicine

## 2020-04-19 ENCOUNTER — Other Ambulatory Visit (INDEPENDENT_AMBULATORY_CARE_PROVIDER_SITE_OTHER): Payer: BC Managed Care – PPO

## 2020-04-19 ENCOUNTER — Other Ambulatory Visit: Payer: Self-pay

## 2020-04-19 DIAGNOSIS — Z79899 Other long term (current) drug therapy: Secondary | ICD-10-CM

## 2020-04-19 DIAGNOSIS — F411 Generalized anxiety disorder: Secondary | ICD-10-CM | POA: Diagnosis not present

## 2020-04-19 DIAGNOSIS — F3131 Bipolar disorder, current episode depressed, mild: Secondary | ICD-10-CM | POA: Diagnosis not present

## 2020-04-19 DIAGNOSIS — F4312 Post-traumatic stress disorder, chronic: Secondary | ICD-10-CM | POA: Diagnosis not present

## 2020-04-19 NOTE — Progress Notes (Signed)
Patient states she is prescribed lithium by her psychologist.  .  She would like to have lithium level checked today.  She was not seen or evaluated by me today.  Advised her to follow-up with her prescribing physician however patient requested to get labs drawn today as her prescribing physician is not able to prescribe.

## 2020-04-20 LAB — LITHIUM LEVEL: Lithium Lvl: 1.2 mmol/L (ref 0.6–1.2)

## 2020-04-20 NOTE — Progress Notes (Signed)
Please inform patient of the following:  Lithium level is normal.  Caleb M. [name], MD 04/20/2020 10:10 AM

## 2020-04-25 ENCOUNTER — Encounter: Payer: Self-pay | Admitting: Family Medicine

## 2020-04-25 ENCOUNTER — Ambulatory Visit (INDEPENDENT_AMBULATORY_CARE_PROVIDER_SITE_OTHER): Payer: BC Managed Care – PPO | Admitting: Family Medicine

## 2020-04-25 ENCOUNTER — Other Ambulatory Visit: Payer: Self-pay

## 2020-04-25 VITALS — BP 142/83 | HR 87 | Temp 98.1°F | Ht 62.0 in | Wt 206.0 lb

## 2020-04-25 DIAGNOSIS — F411 Generalized anxiety disorder: Secondary | ICD-10-CM | POA: Diagnosis not present

## 2020-04-25 DIAGNOSIS — F3131 Bipolar disorder, current episode depressed, mild: Secondary | ICD-10-CM | POA: Diagnosis not present

## 2020-04-25 DIAGNOSIS — E119 Type 2 diabetes mellitus without complications: Secondary | ICD-10-CM

## 2020-04-25 DIAGNOSIS — F325 Major depressive disorder, single episode, in full remission: Secondary | ICD-10-CM | POA: Diagnosis not present

## 2020-04-25 DIAGNOSIS — R251 Tremor, unspecified: Secondary | ICD-10-CM | POA: Diagnosis not present

## 2020-04-25 DIAGNOSIS — F4312 Post-traumatic stress disorder, chronic: Secondary | ICD-10-CM | POA: Diagnosis not present

## 2020-04-25 LAB — COMPREHENSIVE METABOLIC PANEL
ALT: 6 U/L (ref 0–35)
AST: 7 U/L (ref 0–37)
Albumin: 4.4 g/dL (ref 3.5–5.2)
Alkaline Phosphatase: 68 U/L (ref 39–117)
BUN: 7 mg/dL (ref 6–23)
CO2: 25 mEq/L (ref 19–32)
Calcium: 10 mg/dL (ref 8.4–10.5)
Chloride: 104 mEq/L (ref 96–112)
Creatinine, Ser: 0.88 mg/dL (ref 0.40–1.20)
GFR: 79.82 mL/min (ref >60.00)
Glucose, Bld: 166 mg/dL — ABNORMAL HIGH (ref 70–99)
Potassium: 3.8 mEq/L (ref 3.5–5.1)
Sodium: 136 mEq/L (ref 135–145)
Total Bilirubin: 0.5 mg/dL (ref 0.2–1.2)
Total Protein: 7.1 g/dL (ref 6.0–8.3)

## 2020-04-25 LAB — CBC
HCT: 40.3 % (ref 36.0–46.0)
Hemoglobin: 13.5 g/dL (ref 12.0–15.0)
MCHC: 33.4 g/dL (ref 30.0–36.0)
MCV: 80.3 fl (ref 78.0–100.0)
Platelets: 396 10*3/uL (ref 150.0–400.0)
RBC: 5.02 Mil/uL (ref 3.87–5.11)
RDW: 14.3 % (ref 11.5–15.5)
WBC: 8.7 10*3/uL (ref 4.0–10.5)

## 2020-04-25 LAB — TSH: TSH: 2.6 u[IU]/mL (ref 0.35–4.50)

## 2020-04-25 LAB — HEMOGLOBIN A1C: Hgb A1c MFr Bld: 6.9 % — ABNORMAL HIGH (ref 4.6–6.5)

## 2020-04-25 MED ORDER — AMOXICILLIN-POT CLAVULANATE 875-125 MG PO TABS: 1.0000 | ORAL_TABLET | Freq: Two times a day (BID) | ORAL | 0 refills | Status: DC

## 2020-04-25 NOTE — Progress Notes (Signed)
   Heidi Holland is a 44 y.o. female who presents today for an office visit.  Assessment/Plan:  New/Acute Problems: Tremor Likely mild serotonin syndrome or adverse reaction to lithium.  Vital signs today are within normal limits -do not think she needs emergent care at this point.  She has noticeable tremor on exam today and bilateral hands and feet but otherwise her neurologic exam is normal.  Will check labs to rule out other possible causes though recommended that she stop lithium immediately.  She will follow-up with me in a couple days after stopping the lithium.  If symptoms persist and her labs are we will may need referral to neurology or imaging at that point.  Discussed reasons to return to care and seek emergent care.  Sinusitis Likely viral URI.  Will give pocket prescription of Augmentin with instruction to use if symptoms do not improve in the next few days.  She can continue using over-the-counter meds as needed.  Chronic Problems Addressed Today: Depression, major, single episode, complete remission [facility]) Following with psychiatry.  She is on Wellbutrin 150 mg daily and lithium as above.  Advised her to stop the lithium.  Also recommend she follow-up with psychiatry ASAP.  T2DM (type 2 diabetes mellitus) (HCC) Check A1c today.  She is on Metformin 1000 mg twice daily.     Subjective:  HPI:  Patient here with several weeks of tremor, discoordination, subjective chills and fevers, nausea and diarrhea.  She has been seeing a psychiatric nurse practitioner who started her on lithium about 2 months ago.  She also acquired Covid about 2 months ago.  She has additionally had a URI for the last week or so including nasal congestion and cough.  She is no longer taking the Lyrica or Trintellix.  She is taking tramadol for pain.  She is also on Wellbutrin 150 mg daily.  She had a lithium level checked last week which was normal.  No seizure-like activity.  No reported confusion or altered  mentation.  Symptoms seem to be worsening.        Objective:  Physical Exam: BP (!) 142/83   Pulse 87   Temp 98.1 F (36.7 C) (Temporal)   Ht 5\' 2"  (1.575 m)   Wt 206 lb (93.4 kg)   SpO2 98%   BMI 37.68 kg/m   Gen: No acute distress, resting comfortably CV: Regular rate and rhythm with no murmurs appreciated Pulm: Normal work of breathing, clear to auscultation bilaterally with no crackles, wheezes, or rhonchi Neuro: Cranial nerves II through XII intact.  Tremor noted in bilateral hands with slight asterixis.  Treatment and slight clonus noted in bilateral lower extremities.  Reflexes 2+ and symmetric throughout. Psych: Normal affect and thought content      Garren Greenman M. , MD 04/25/2020 8:37 AM

## 2020-04-25 NOTE — Patient Instructions (Signed)
It was very nice to see you today!  I think you may have a mild case of serotonin syndrome.  This is probably coming from the lithium.  Please stop taking this today.    We will check blood work to make sure there is nothing else that is going on.  Please check with me in a couple days to let me know how you are doing.  Please start the antibiotic if your sinus congestion is not improving in the next few days.  Take care, Dr [name]  Please try these tips to maintain a healthy lifestyle:   Eat at least 3 REAL meals and 1-2 snacks per day.  Aim for no more than 5 hours between eating.  If you eat breakfast, please do so within one hour of getting up.    Each meal should contain half fruits/vegetables, one quarter protein, and one quarter carbs (no bigger than a computer mouse)   Cut down on sweet beverages. This includes juice, soda, and sweet tea.     Drink at least 1 glass of water with each meal and aim for at least 8 glasses per day   Exercise at least 150 minutes every week.

## 2020-04-25 NOTE — Assessment & Plan Note (Signed)
Check A1c today.  She is on Metformin 1000 mg twice daily.

## 2020-04-25 NOTE — Assessment & Plan Note (Signed)
Following with psychiatry.  She is on Wellbutrin 150 mg daily and lithium as above.  Advised her to stop the lithium.  Also recommend she follow-up with psychiatry ASAP.

## 2020-04-26 DIAGNOSIS — G43709 Chronic migraine without aura, not intractable, without status migrainosus: Secondary | ICD-10-CM | POA: Diagnosis not present

## 2020-04-26 NOTE — Progress Notes (Signed)
Please inform patient of the following:  Labs are all stable.  No clear explanation for symptoms in these labs.  Like we discussed I think it is most likely coming from her lithium.  Would like her to let us know if her symptoms have not improved since she has stopped her lithium.

## 2020-04-28 ENCOUNTER — Other Ambulatory Visit: Payer: Self-pay | Admitting: Family Medicine

## 2020-04-28 NOTE — Telephone Encounter (Signed)
Requesting refills

## 2020-04-29 ENCOUNTER — Other Ambulatory Visit: Payer: Self-pay

## 2020-04-29 ENCOUNTER — Ambulatory Visit (INDEPENDENT_AMBULATORY_CARE_PROVIDER_SITE_OTHER): Payer: BC Managed Care – PPO | Admitting: Neurology

## 2020-04-29 DIAGNOSIS — G43709 Chronic migraine without aura, not intractable, without status migrainosus: Secondary | ICD-10-CM | POA: Diagnosis not present

## 2020-04-29 MED ORDER — ONABOTULINUMTOXINA 100 UNITS IJ SOLR
200.0000 [IU] | Freq: Once | INTRAMUSCULAR | Status: AC
  Administered 2020-04-29: 155 [IU] via INTRAMUSCULAR

## 2020-04-29 NOTE — Progress Notes (Signed)
[facility]   Procedure Note Botox  Attending: Dr. Adam Jaffe  Preoperative Diagnosis(es): Chronic migraine  Consent obtained from: The patient Benefits discussed included, but were not limited to decreased muscle tightness, increased joint range of motion, and decreased pain.  Risk discussed included, but were not limited pain and discomfort, bleeding, bruising, excessive weakness, venous thrombosis, muscle atrophy and dysphagia.  Anticipated outcomes of the procedure as well as he risks and benefits of the alternatives to the procedure, and the roles and tasks of the personnel to be involved, were discussed with the patient, and the patient consents to the procedure and agrees to proceed. A copy of the patient medication guide was given to the patient which explains the blackbox warning.  Patients identity and treatment sites confirmed Yes.  .  Details of Procedure: Skin was cleaned with alcohol. Prior to injection, the needle plunger was aspirated to make sure the needle was not within a blood vessel.  There was no blood retrieved on aspiration.    Following is a summary of the muscles injected  And the amount of Botulinum toxin used:  Dilution 200 units of Botox was reconstituted with 4 ml of preservative free normal saline. Time of reconstitution: At the time of the office visit (<30 minutes prior to injection)   Injections  155 total units of Botox was injected with a 30 gauge needle.  Injection Sites: L occipitalis: 15 units- 3 sites  R occiptalis: 15 units- 3 sites  L upper trapezius: 15 units- 3 sites R upper trapezius: 15 units- 3 sits          L paraspinal: 10 units- 2 sites R paraspinal: 10 units- 2 sites  Face L frontalis(2 injection sites):10 units   R frontalis(2 injection sites):10 units         L corrugator: 5 units   R corrugator: 5 units           Procerus: 5 units   L temporalis: 20 units R temporalis: 20 units   Agent:  200 units of botulinum Type  A (Onobotulinum Toxin type A) was reconstituted with 4 ml of preservative free normal saline.  Time of reconstitution: At the time of the office visit (<30 minutes prior to injection)     Total injected (Units): 155  Total wasted (Units): none wasted  Patient tolerated procedure well without complications.   Reinjection is anticipated in 3 months.    

## 2020-05-09 DIAGNOSIS — F3131 Bipolar disorder, current episode depressed, mild: Secondary | ICD-10-CM | POA: Diagnosis not present

## 2020-05-09 DIAGNOSIS — F4312 Post-traumatic stress disorder, chronic: Secondary | ICD-10-CM | POA: Diagnosis not present

## 2020-05-09 DIAGNOSIS — F411 Generalized anxiety disorder: Secondary | ICD-10-CM | POA: Diagnosis not present

## 2020-05-16 ENCOUNTER — Other Ambulatory Visit: Payer: Self-pay | Admitting: Family Medicine

## 2020-05-17 NOTE — Telephone Encounter (Signed)
Last refill 04/29/2020 Last OV 04/25/2020 dx Tremor

## 2020-05-20 ENCOUNTER — Encounter: Payer: Self-pay | Admitting: Neurology

## 2020-05-24 ENCOUNTER — Other Ambulatory Visit: Payer: Self-pay | Admitting: Family Medicine

## 2020-05-24 NOTE — Telephone Encounter (Signed)
Rx request 

## 2020-05-27 ENCOUNTER — Other Ambulatory Visit: Payer: Self-pay | Admitting: Family Medicine

## 2020-05-30 ENCOUNTER — Other Ambulatory Visit: Payer: Self-pay | Admitting: Family Medicine

## 2020-05-31 NOTE — Telephone Encounter (Signed)
Rx Request 

## 2020-06-06 DIAGNOSIS — F4312 Post-traumatic stress disorder, chronic: Secondary | ICD-10-CM | POA: Diagnosis not present

## 2020-06-06 DIAGNOSIS — F3131 Bipolar disorder, current episode depressed, mild: Secondary | ICD-10-CM | POA: Diagnosis not present

## 2020-06-06 DIAGNOSIS — F411 Generalized anxiety disorder: Secondary | ICD-10-CM | POA: Diagnosis not present

## 2020-06-14 ENCOUNTER — Other Ambulatory Visit: Payer: Self-pay

## 2020-06-14 MED ORDER — TIZANIDINE HCL 4 MG PO TABS: 4.0000 mg | ORAL_TABLET | Freq: Three times a day (TID) | ORAL | 0 refills | Status: DC

## 2020-06-14 MED ORDER — TRAMADOL HCL 50 MG PO TABS
50.0000 mg | ORAL_TABLET | Freq: Three times a day (TID) | ORAL | 0 refills | Status: DC | PRN
Start: 2020-06-14 — End: 2020-06-27

## 2020-06-14 NOTE — Telephone Encounter (Signed)
  LAST APPOINTMENT DATE: 04/25/2020   NEXT APPOINTMENT DATE:@Visit  date not found  MEDICATION:traMADol (ULTRAM) 50 MG tablet // tiZANidine (ZANAFLEX) 4 MG tablet  PHARMACY:[id number] - [place], [place] - [address]   Please advise

## 2020-06-16 DIAGNOSIS — M25552 Pain in left hip: Secondary | ICD-10-CM | POA: Diagnosis not present

## 2020-06-23 ENCOUNTER — Telehealth: Payer: Self-pay

## 2020-06-23 NOTE — Telephone Encounter (Signed)
MEDICATION:   traMADol (ULTRAM) 50 MG tablet   PHARMACY: [facility]  Comments:   **Let patient know to contact pharmacy at the end of the day to make sure medication is ready. **  ** Please notify patient to allow 48-72 hours to process**  **Encourage patient to contact the pharmacy for refills or they can request refills through [facility]**

## 2020-06-24 ENCOUNTER — Telehealth: Payer: Self-pay | Admitting: Family Medicine

## 2020-06-24 DIAGNOSIS — M5416 Radiculopathy, lumbar region: Secondary | ICD-10-CM | POA: Diagnosis not present

## 2020-06-24 DIAGNOSIS — M62830 Muscle spasm of back: Secondary | ICD-10-CM | POA: Diagnosis not present

## 2020-06-24 NOTE — Telephone Encounter (Signed)
Request send to PCP  

## 2020-06-24 NOTE — Telephone Encounter (Signed)
Patient is calling in stating that he is completely out and is wondering why it is being denied.

## 2020-06-24 NOTE — Telephone Encounter (Signed)
See note

## 2020-06-27 MED ORDER — TRAMADOL HCL 50 MG PO TABS: 50.0000 mg | ORAL_TABLET | Freq: Three times a day (TID) | ORAL | 0 refills | Status: DC | PRN

## 2020-06-27 NOTE — Addendum Note (Signed)
Addended by: Ardith Dark on: 06/27/2020 08:34 AM   Modules accepted: Orders

## 2020-07-01 ENCOUNTER — Encounter (HOSPITAL_COMMUNITY): Payer: Self-pay | Admitting: Emergency Medicine

## 2020-07-01 ENCOUNTER — Other Ambulatory Visit: Payer: Self-pay

## 2020-07-01 ENCOUNTER — Ambulatory Visit (HOSPITAL_COMMUNITY)
Admission: EM | Admit: 2020-07-01 | Discharge: 2020-07-01 | Disposition: A | Discharge status: Home / Self Care | Payer: BC Managed Care – PPO | Attending: Family Medicine | Admitting: Family Medicine

## 2020-07-01 DIAGNOSIS — G8929 Other chronic pain: Secondary | ICD-10-CM

## 2020-07-01 DIAGNOSIS — G43009 Migraine without aura, not intractable, without status migrainosus: Secondary | ICD-10-CM

## 2020-07-01 DIAGNOSIS — M5442 Lumbago with sciatica, left side: Secondary | ICD-10-CM | POA: Diagnosis not present

## 2020-07-01 MED ORDER — KETOROLAC TROMETHAMINE 30 MG/ML IJ SOLN
30.0000 mg | Freq: Once | INTRAMUSCULAR | Status: AC
  Administered 2020-07-01: 30 mg via INTRAMUSCULAR

## 2020-07-01 MED ORDER — KETOROLAC TROMETHAMINE 30 MG/ML IJ SOLN
INTRAMUSCULAR | Status: AC
  Filled 2020-07-01: qty 1

## 2020-07-01 MED ORDER — DEXAMETHASONE SODIUM PHOSPHATE 10 MG/ML IJ SOLN
INTRAMUSCULAR | Status: AC
  Filled 2020-07-01: qty 1

## 2020-07-01 MED ORDER — DEXAMETHASONE SODIUM PHOSPHATE 10 MG/ML IJ SOLN
10.0000 mg | Freq: Once | INTRAMUSCULAR | Status: AC
  Administered 2020-07-01: 10 mg via INTRAMUSCULAR

## 2020-07-01 MED ORDER — PREDNISONE 20 MG PO TABS: 40.0000 mg | ORAL_TABLET | Freq: Every day | ORAL | 0 refills | Status: DC

## 2020-07-01 NOTE — ED Triage Notes (Signed)
Pt presents today with c/o of lower back pain and headache with light sensitivity.

## 2020-07-01 NOTE — ED Provider Notes (Signed)
[name]    CSN: [id number] Arrival date & time: 07/01/20  1940      History   Chief Complaint Chief Complaint  Patient presents with  . Back Pain  . Migraine    HPI Heidi Holland is a 45 y.o. female.   Patient presenting today with multiple complaints.  She states she sees [facility] for chronic back issues with left-sided sciatica for which she is about to start doing injection therapy for.  She states not only she having her typical back pain but she is also having some upper back pain right under her bra strap and the muscles which is triggering a migraine episode the past 3 days that she has not been able to care for with p.o. Toradol, Imitrex, Tylenol.  She is having some light sensitivity but otherwise no associated symptoms with her current migraine.  She denies any new or concerning symptoms beyond her baseline for both of these conditions.  She states usually a shot of Decadron will help when something like this happens for her.  She does have a primary care that she plans to follow-up with first thing next week.     Past Medical History:  Diagnosis Date  . Allergy   . Anxiety   . Asthma   . Depression   . Diabetes mellitus without complication ([place])   . Diverticulitis   . GERD (gastroesophageal reflux disease)   . HPV in female   . Hyperlipidemia   . Migraines     Patient Active Problem List   Diagnosis Date Noted  . Status post left hip replacement 03/12/2019  . T2DM (type 2 diabetes mellitus) ([place]) 07/31/2018  . Dyslipidemia associated with type 2 diabetes mellitus ([place]) 07/31/2018  . Allergic rhinitis 07/31/2018  . Mild intermittent asthma, uncomplicated 97/35/3299  . GERD (gastroesophageal reflux disease) 07/31/2018  . S/P gastric bypass 07/31/2018  . Migraine 07/31/2018  . Anxiety 07/31/2018  . Depression, major, single episode, complete remission ([place]) 07/31/2018  . Insomnia 07/31/2018  . Umbilical hernia 24/26/8341  . Iron  deficiency anemia 06/09/2018  . Cervicogenic headache 05/12/2018  . Low back pain 05/12/2018  . Polyarthralgia 05/12/2018    Past Surgical History:  Procedure Laterality Date  . BOWEL RESECTION    . gasticbypass    . GASTRIC BYPASS    . KNEE ARTHROSCOPY    . TOTAL HIP ARTHROPLASTY Left     OB History   No obstetric history on file.      Home Medications    Prior to Admission medications   Medication Sig Start Date End Date Taking? Authorizing Provider  predniSONE (DELTASONE) 20 MG tablet Take 2 tablets (40 mg total) by mouth daily with breakfast. 07/01/20  Yes Volney American, PA-C  acetaminophen (TYLENOL) 325 MG tablet Take 650 mg by mouth every 6 (six) hours as needed for mild pain.    [provider]  albuterol (VENTOLIN HFA) 108 (90 Base) MCG/ACT inhaler INHALE 2 PUFFS BY MOUTH EVERY 6 HOURS AS NEEDED FOR WHEEZE OR SHORTNESS OF BREATH 01/06/20   Vivi Barrack, MD  amoxicillin-clavulanate (AUGMENTIN) 875-125 MG tablet Take 1 tablet by mouth 2 (two) times daily. 04/25/20   Vivi Barrack, MD  blood glucose meter kit and supplies KIT Dispense based on patient and insurance preference. Use up to four times daily as directed. (FOR ICD-9 250.00, 250.01). 07/31/18   Vivi Barrack, MD  buPROPion [facility] SR) 150 MG 12 hr tablet Take 150 mg  by mouth daily.    [provider]  celecoxib (CELEBREX) 100 MG capsule TAKE 1 CAPSULE BY MOUTH TWICE A DAY 01/27/20   Vivi Barrack, MD  clonazePAM (KLONOPIN) 0.5 MG tablet TAKE 1 TABLET BY MOUTH 2 TIMES DAILY AS NEEDED FOR ANXIETY. 02/15/20   Vivi Barrack, MD  Continuous Blood Gluc Receiver (FREESTYLE LIBRE READER) DEVI 1 Device by Does not apply route daily as needed (to check blood sugar). 08/14/18   Vivi Barrack, MD  Continuous Blood Gluc Sensor (FREESTYLE LIBRE SENSOR SYSTEM) MISC Apply 1 sensor to skin every 14 days. 08/14/18   Vivi Barrack, MD  fluticasone [facility]) 50 MCG/ACT nasal spray Place 2 sprays into  both nostrils daily for 7 days. 09/08/19 09/15/19  Darr, Edison Nasuti, PA-C  hydrOXYzine (ATARAX/VISTARIL) 50 MG tablet Take 0.5-2 tablets (25-100 mg total) by mouth at bedtime as needed for anxiety. 03/01/20   Vivi Barrack, MD  ipratropium (ATROVENT) 0.06 % nasal spray Place 2 sprays into both nostrils 4 (four) times daily. 07/20/19   Vivi Barrack, MD  IRON-VITAMIN C PO Take by mouth.    [provider]  lamoTRIgine (LAMICTAL) 200 MG tablet Take 200 mg by mouth.  07/09/19   [provider]  lithium carbonate (LITHOBID) 300 MG CR tablet Take by mouth. 01/28/20   [provider]  meclizine (ANTIVERT) 25 MG tablet Take 1 tablet (25 mg total) by mouth 3 (three) times daily as needed for dizziness. 03/01/20   Hazel Sams, PA-C  metFORMIN (GLUCOPHAGE) 1000 MG tablet TAKE 1 TABLET BY MOUTH TWICE A DAY WITH A MEAL 12/31/19   Vivi Barrack, MD  ondansetron (ZOFRAN-ODT) 4 MG disintegrating tablet DISSOLVE 1 TABLET ON TONGUE EVERY 8 HOURS AS NEEDED FOR NAUSEA AND VOMITING 02/15/20   Vivi Barrack, MD  pantoprazole (PROTONIX) 40 MG tablet TAKE 1 TABLET BY MOUTH EVERY DAY IN THE EVENING 03/11/20   Vivi Barrack, MD  pregabalin (LYRICA) 150 MG capsule TAKE 1 CAPSULE BY MOUTH TWICE A DAY 05/25/20   Vivi Barrack, MD  rosuvastatin (CRESTOR) 40 MG tablet Take 1 tablet (40 mg total) by mouth daily. 11/16/19   Vivi Barrack, MD  sodium chloride (OCEAN) 0.65 % SOLN nasal spray Place 1 spray into both nostrils as needed for congestion. 09/08/19   Darr, Edison Nasuti, PA-C  tiZANidine (ZANAFLEX) 4 MG tablet Take 1 tablet (4 mg total) by mouth 3 (three) times daily. 06/14/20   Vivi Barrack, MD  TOSYMRA 10 MG/ACT SOLN PLACE 1 SPRAY INTO THE NOSE EVERY HOUR AS NEEDED (MAXIMUM 3 SPRAYS IN 24 HOURS) 11/16/19   Tomi Likens, Adam R, DO  traMADol (ULTRAM) 50 MG tablet Take 1 tablet (50 mg total) by mouth every 8 (eight) hours as needed. 06/27/20   Vivi Barrack, MD  Vitamin D, Ergocalciferol, (DRISDOL) 1.25 MG  (50000 UNIT) CAPS capsule TAKE 1 CAPSULE BY MOUTH EVERY 7 DAYS 05/28/19   Lyndal Pulley, DO  zolpidem (AMBIEN) 5 MG tablet Take 1 tablet (5 mg total) by mouth at bedtime as needed for sleep. 10/23/19   Vivi Barrack, MD    Family History Family History  Problem Relation Age of Onset  . Heart disease Mother   . Lung cancer Mother   . Rheum arthritis Mother   . Arthritis Mother   . Cancer Mother   . COPD Mother   . Miscarriages / [place] Mother   . Diabetes Father   . Heart  disease Father   . Breast cancer Paternal Aunt     Social History Social History   Tobacco Use  . Smoking status: Never Smoker  . Smokeless tobacco: Never Used  Vaping Use  . Vaping Use: Never used  Substance Use Topics  . Alcohol use: Yes    Comment: Occ  . Drug use: Yes    Types: Marijuana    Comment: THC     Allergies   Ciprofloxacin   Review of Systems Review of Systems Per HPI Physical Exam Triage Vital Signs ED Triage Vitals  Enc Vitals Group     BP 07/01/20 1954 (!) 169/89     Pulse Rate 07/01/20 1954 80     Resp 07/01/20 1954 18     Temp 07/01/20 1954 98.3 F (36.8 C)     Temp Source 07/01/20 1954 Oral     SpO2 --      Weight --      Height --      Head Circumference --      Peak Flow --      Pain Score 07/01/20 1948 10     Pain Loc --      Pain Edu? --      Excl. in [place]? --    No data found.  Updated Vital Signs BP (!) 169/89 (BP Location: Right Arm)   Pulse 80   Temp 98.3 F (36.8 C) (Oral)   Resp 18   LMP 06/12/2020   Visual Acuity Right Eye Distance:   Left Eye Distance:   Bilateral Distance:    Right Eye Near:   Left Eye Near:    Bilateral Near:     Physical Exam Vitals and nursing note reviewed.  Constitutional:      Appearance: Normal appearance. She is not ill-appearing.  HENT:     Head: Atraumatic.     Mouth/Throat:     Mouth: Mucous membranes are moist.     Pharynx: Oropharynx is clear. No posterior oropharyngeal erythema.  Eyes:      Extraocular Movements: Extraocular movements intact.     Conjunctiva/sclera: Conjunctivae normal.  Cardiovascular:     Rate and Rhythm: Normal rate and regular rhythm.     Heart sounds: Normal heart sounds.  Pulmonary:     Effort: Pulmonary effort is normal.     Breath sounds: Normal breath sounds.  Abdominal:     General: Bowel sounds are normal. There is no distension.     Palpations: Abdomen is soft.     Tenderness: There is no abdominal tenderness. There is no guarding.  Musculoskeletal:        General: Tenderness present. Normal range of motion.     Cervical back: Normal range of motion and neck supple.     Comments: Tender to palpation thoracic paraspinal muscles  Skin:    General: Skin is warm and dry.  Neurological:     General: No focal deficit present.     Mental Status: She is alert and oriented to person, place, and time.     Cranial Nerves: No cranial nerve deficit.     Motor: No weakness.     Gait: Gait normal.  Psychiatric:        Mood and Affect: Mood normal.        Thought Content: Thought content normal.        Judgment: Judgment normal.    UC Treatments / Results  Labs (all labs ordered are listed, but only abnormal results  are displayed) Labs Reviewed - No data to display  EKG   Radiology No results found.  Procedures Procedures (including critical care time)  Medications Ordered in UC Medications  ketorolac (TORADOL) 30 MG/ML injection 30 mg (has no administration in time range)  dexamethasone (DECADRON) injection 10 mg (has no administration in time range)    Initial Impression / Assessment and Plan / UC Course  I have reviewed the triage vital signs and the nursing notes.  Pertinent labs & imaging results that were available during my care of the patient were reviewed by me and considered in my medical decision making (see chart for details).     Vitals and exam overall reassuring today, she does appear to be in significant discomfort.   We will treat with IM Toradol, Decadron, sent home a prescription for prednisone in case this wears off prior to being able to follow-up with her PCP and orthopedist.  She does have Imitrex at home as well that she will continue taking until resolved.  Discussed strict return precautions for acutely worsening symptoms.  Final Clinical Impressions(s) / UC Diagnoses   Final diagnoses:  Chronic midline low back pain with left-sided sciatica  Migraine without aura and without status migrainosus, not intractable   Discharge Instructions   None    ED Prescriptions    Medication Sig Dispense Auth. Provider   predniSONE (DELTASONE) 20 MG tablet Take 2 tablets (40 mg total) by mouth daily with breakfast. 10 tablet Volney American, [place]     PDMP not reviewed this encounter.   Volney American, [place] 07/01/20 2012

## 2020-07-04 DIAGNOSIS — F3131 Bipolar disorder, current episode depressed, mild: Secondary | ICD-10-CM | POA: Diagnosis not present

## 2020-07-04 DIAGNOSIS — F4312 Post-traumatic stress disorder, chronic: Secondary | ICD-10-CM | POA: Diagnosis not present

## 2020-07-04 DIAGNOSIS — F411 Generalized anxiety disorder: Secondary | ICD-10-CM | POA: Diagnosis not present

## 2020-07-07 ENCOUNTER — Other Ambulatory Visit: Payer: Self-pay | Admitting: Family Medicine

## 2020-07-08 ENCOUNTER — Other Ambulatory Visit: Payer: Self-pay | Admitting: Family Medicine

## 2020-07-08 NOTE — Telephone Encounter (Signed)
Patient is calling in stating that the medication has been denied, and wanted to know why it was denied. Patient is completely out.

## 2020-07-09 ENCOUNTER — Encounter: Payer: Self-pay | Admitting: *Deleted

## 2020-07-09 NOTE — Telephone Encounter (Signed)
Dr. [name], pt requesting refill on Tramadol. Refilled on 5/16 by you. Pt says she is completely out. Please advise.

## 2020-07-12 MED ORDER — TRAMADOL HCL 50 MG PO TABS: 50.0000 mg | ORAL_TABLET | Freq: Three times a day (TID) | ORAL | 0 refills | Status: DC | PRN

## 2020-07-12 NOTE — Telephone Encounter (Signed)
Dr. [name] pt would like a refill for Tramadol 50 mg one tablet every 8 hours. Pt said she was only given a 10 day supply.

## 2020-07-12 NOTE — Progress Notes (Deleted)
[facility] [place] Phone: [phone] Subjective:    I'm seeing this patient by the request  of:  Vivi Barrack, MD  CC: Headache and low back pain  [id number]  Heidi Holland is a 45 y.o. female coming in with complaint of back pain. OMT 08/02/2018.   Onset-  Location Duration-  Character- Aggravating factors- Reliving factors-  Therapies tried-  Severity-   Patient is receiving tramadol from primary care provider.  Patient did have an MRI from an outside facility in 2020 that showed patient otherwise fairly unremarkable. Patient is also had a CT scan of the abdomen pelvis done in May 2021 that also did not have any significant findings.  Seen Dr. Tomi Likens and has had Botox injections left side in March 2022  Patient did undergo a left-sided total hip arthroplasty in 2021  Past Medical History:  Diagnosis Date  . Allergy   . Anxiety   . Asthma   . Depression   . Diabetes mellitus without complication ([place])   . Diverticulitis   . GERD (gastroesophageal reflux disease)   . HPV in female   . Hyperlipidemia   . Migraines    Past Surgical History:  Procedure Laterality Date  . BOWEL RESECTION    . gasticbypass    . GASTRIC BYPASS    . KNEE ARTHROSCOPY    . TOTAL HIP ARTHROPLASTY Left    Social History   Socioeconomic History  . Marital status: Single    Spouse name: Not on file  . Number of children: 0  . Years of education: 41  . Highest education level: Associate degree: occupational, Hotel manager, or vocational program  Occupational History    Employer: [employer]  Tobacco Use  . Smoking status: Never Smoker  . Smokeless tobacco: Never Used  Vaping Use  . Vaping Use: Never used  Substance and Sexual Activity  . Alcohol use: Yes    Comment: Occ  . Drug use: Yes    Types: Marijuana    Comment: THC  . Sexual activity: Never  Other Topics Concern  . Not on file  Social History Narrative    Right handed.    Social Determinants of Health   Financial Resource Strain: Not on file  Food Insecurity: Not on file  Transportation Needs: Not on file  Physical Activity: Not on file  Stress: Not on file  Social Connections: Not on file   Allergies  Allergen Reactions  . Ciprofloxacin Itching   Family History  Problem Relation Age of Onset  . Heart disease Mother   . Lung cancer Mother   . Rheum arthritis Mother   . Arthritis Mother   . Cancer Mother   . COPD Mother   . Miscarriages / [place] Mother   . Diabetes Father   . Heart disease Father   . Breast cancer Paternal Aunt     Current Outpatient Medications (Endocrine & Metabolic):  .  metFORMIN (GLUCOPHAGE) 1000 MG tablet, TAKE 1 TABLET BY MOUTH TWICE A DAY WITH A MEAL .  predniSONE (DELTASONE) 20 MG tablet, Take 2 tablets (40 mg total) by mouth daily with breakfast.  Current Outpatient Medications (Cardiovascular):  .  rosuvastatin (CRESTOR) 40 MG tablet, Take 1 tablet (40 mg total) by mouth daily.  Current Outpatient Medications (Respiratory):  .  albuterol (VENTOLIN HFA) 108 (90 Base) MCG/ACT inhaler, INHALE 2 PUFFS BY MOUTH EVERY 6 HOURS AS NEEDED FOR WHEEZE OR SHORTNESS OF BREATH .  fluticasone (FLONASE) 50 MCG/ACT nasal spray, Place 2 sprays into both nostrils daily for 7 days. Marland Kitchen  ipratropium (ATROVENT) 0.06 % nasal spray, Place 2 sprays into both nostrils 4 (four) times daily. .  sodium chloride (OCEAN) 0.65 % SOLN nasal spray, Place 1 spray into both nostrils as needed for congestion.  Current Outpatient Medications (Analgesics):  .  acetaminophen (TYLENOL) 325 MG tablet, Take 650 mg by mouth every 6 (six) hours as needed for mild pain. .  celecoxib (CELEBREX) 100 MG capsule, TAKE 1 CAPSULE BY MOUTH TWICE A DAY .  TOSYMRA 10 MG/ACT SOLN, PLACE 1 SPRAY INTO THE NOSE EVERY HOUR AS NEEDED (MAXIMUM 3 SPRAYS IN 24 HOURS) .  traMADol (ULTRAM) 50 MG tablet, Take 1 tablet (50 mg total) by mouth every 8 (eight)  hours as needed.  Current Outpatient Medications (Hematological):  Marland Kitchen  IRON-VITAMIN C PO, Take by mouth.  Current Outpatient Medications (Other):  .  amoxicillin-clavulanate (AUGMENTIN) 875-125 MG tablet, Take 1 tablet by mouth 2 (two) times daily. .  blood glucose meter kit and supplies KIT, Dispense based on patient and insurance preference. Use up to four times daily as directed. (FOR ICD-9 250.00, 250.01). Marland Kitchen  buPROPion (WELLBUTRIN SR) 150 MG 12 hr tablet, Take 150 mg by mouth daily. .  clonazePAM (KLONOPIN) 0.5 MG tablet, TAKE 1 TABLET BY MOUTH 2 TIMES DAILY AS NEEDED FOR ANXIETY. .  Continuous Blood Gluc Receiver (FREESTYLE LIBRE READER) DEVI, 1 Device by Does not apply route daily as needed (to check blood sugar). .  Continuous Blood Gluc Sensor (FREESTYLE LIBRE SENSOR SYSTEM) MISC, Apply 1 sensor to skin every 14 days. .  hydrOXYzine (ATARAX/VISTARIL) 50 MG tablet, Take 0.5-2 tablets (25-100 mg total) by mouth at bedtime as needed for anxiety. .  lamoTRIgine (LAMICTAL) 200 MG tablet, Take 200 mg by mouth.  .  lithium carbonate (LITHOBID) 300 MG CR tablet, Take by mouth. .  meclizine (ANTIVERT) 25 MG tablet, Take 1 tablet (25 mg total) by mouth 3 (three) times daily as needed for dizziness. .  ondansetron (ZOFRAN-ODT) 4 MG disintegrating tablet, DISSOLVE 1 TABLET ON TONGUE EVERY 8 HOURS AS NEEDED FOR NAUSEA AND VOMITING .  pantoprazole (PROTONIX) 40 MG tablet, TAKE 1 TABLET BY MOUTH EVERY DAY IN THE EVENING .  pregabalin (LYRICA) 150 MG capsule, TAKE 1 CAPSULE BY MOUTH TWICE A DAY .  tiZANidine (ZANAFLEX) 4 MG tablet, Take 1 tablet (4 mg total) by mouth 3 (three) times daily. .  Vitamin D, Ergocalciferol, (DRISDOL) 1.25 MG (50000 UNIT) CAPS capsule, TAKE 1 CAPSULE BY MOUTH EVERY 7 DAYS .  zolpidem (AMBIEN) 5 MG tablet, Take 1 tablet (5 mg total) by mouth at bedtime as needed for sleep.   Reviewed prior external information including notes and imaging from  primary care provider As well  as notes that were available from care everywhere and other healthcare systems.  Past medical history, social, surgical and family history all reviewed in electronic medical record.  No pertanent information unless stated regarding to the chief complaint.   Review of Systems:  No headache, visual changes, nausea, vomiting, diarrhea, constipation, dizziness, abdominal pain, skin rash, fevers, chills, night sweats, weight loss, swollen lymph nodes, body aches, joint swelling, chest pain, shortness of breath, mood changes. POSITIVE muscle aches  Objective  There were no vitals taken for this visit.   General: No apparent distress alert and oriented x3 mood and affect normal, dressed appropriately.  HEENT: Pupils equal, extraocular movements intact  Respiratory: Patient's  speak in full sentences and does not appear short of breath  Cardiovascular: No lower extremity edema, non tender, no erythema  Gait normal with good balance and coordination.  MSK:  Non tender with full range of motion and good stability and symmetric strength and tone of shoulders, elbows, wrist, hip, knee and ankles bilaterally.     Impression and Recommendations:     The above documentation has been reviewed and is accurate and complete Lyndal Pulley, DO

## 2020-07-13 ENCOUNTER — Ambulatory Visit: Payer: BC Managed Care – PPO | Admitting: Family Medicine

## 2020-07-13 DIAGNOSIS — F411 Generalized anxiety disorder: Secondary | ICD-10-CM | POA: Diagnosis not present

## 2020-07-13 DIAGNOSIS — F3131 Bipolar disorder, current episode depressed, mild: Secondary | ICD-10-CM | POA: Diagnosis not present

## 2020-07-13 DIAGNOSIS — F4312 Post-traumatic stress disorder, chronic: Secondary | ICD-10-CM | POA: Diagnosis not present

## 2020-07-14 ENCOUNTER — Other Ambulatory Visit: Payer: Self-pay | Admitting: Family Medicine

## 2020-07-20 DIAGNOSIS — G43709 Chronic migraine without aura, not intractable, without status migrainosus: Secondary | ICD-10-CM | POA: Diagnosis not present

## 2020-07-20 NOTE — Progress Notes (Signed)
[facility] [address] [place] Phone: [phone] Subjective:   I Heidi Holland am serving as a Education administrator for Dr. Hulan Saas.  This visit occurred during the SARS-CoV-2 public health emergency.  Safety protocols were in place, including screening questions prior to the visit, additional usage of staff PPE, and extensive cleaning of exam room while observing appropriate contact time as indicated for disinfecting solutions.   I'm seeing this patient by the request  of:  Vivi Barrack, MD  CC: Headache, neck pain and back pain follow-up  IOX:[id number]  Heidi Holland is a 45 y.o. female coming in with complaint of back pain. Seen for OMT in 2020. Patient states she is having similar pain as before. Back pain near the scapulas. States she is getting 2 injections in her back soon at [facility]. Numbness and tingling in the left leg. Patient states she gets migraines and gets Botox from neurology. Most of her pain is on the left side. History of hip replacement on the left side.   Onset-  Location - scapulas  Duration-  Character- sharp, throbbing  Aggravating factors- sitting, standing  Reliving factors- walking  Therapies tried- ice, heat  Severity- 10/10 at its worse has had to go to Urgent Care due to pain  Patient was last seen in 2020 and was responding well to manipulation.    Past Medical History:  Diagnosis Date   Allergy    Anxiety    Asthma    Depression    Diabetes mellitus without complication (HCC)    Diverticulitis    GERD (gastroesophageal reflux disease)    HPV in female    Hyperlipidemia    Migraines    Past Surgical History:  Procedure Laterality Date   BOWEL RESECTION     gasticbypass     GASTRIC BYPASS     KNEE ARTHROSCOPY     TOTAL HIP ARTHROPLASTY Left    Social History   Socioeconomic History   Marital status: Single    Spouse name: Not on file   Number of children: 0   Years of education: 14    Highest education level: Associate degree: occupational, Hotel manager, or vocational program  Occupational History    Employer: [employer]  Tobacco Use   Smoking status: Never   Smokeless tobacco: Never  Vaping Use   Vaping Use: Never used  Substance and Sexual Activity   Alcohol use: Yes    Comment: Occ   Drug use: Yes    Types: Marijuana    Comment: THC   Sexual activity: Never  Other Topics Concern   Not on file  Social History Narrative   Right handed.    Social Determinants of Health   Financial Resource Strain: Not on file  Food Insecurity: Not on file  Transportation Needs: Not on file  Physical Activity: Not on file  Stress: Not on file  Social Connections: Not on file   Allergies  Allergen Reactions   Ciprofloxacin Itching   Family History  Problem Relation Age of Onset   Heart disease Mother    Lung cancer Mother    Rheum arthritis Mother    Arthritis Mother    Cancer Mother    COPD Mother    Miscarriages / [place] Mother    Diabetes Father    Heart disease Father    Breast cancer Paternal Aunt     Current Outpatient Medications (Endocrine & Metabolic):    metFORMIN (GLUCOPHAGE) 1000  MG tablet, TAKE 1 TABLET BY MOUTH TWICE A DAY WITH A MEAL   Current Outpatient Medications (Respiratory):    albuterol (VENTOLIN HFA) 108 (90 Base) MCG/ACT inhaler, INHALE 2 PUFFS BY MOUTH EVERY 6 HOURS AS NEEDED FOR WHEEZE OR SHORTNESS OF BREATH   sodium chloride (OCEAN) 0.65 % SOLN nasal spray, Place 1 spray into both nostrils as needed for congestion.   fluticasone (FLONASE) 50 MCG/ACT nasal spray, Place 2 sprays into both nostrils daily for 7 days.  Current Outpatient Medications (Analgesics):    acetaminophen (TYLENOL) 325 MG tablet, Take 650 mg by mouth every 6 (six) hours as needed for mild pain.   TOSYMRA 10 MG/ACT SOLN, PLACE 1 SPRAY INTO THE NOSE EVERY HOUR AS NEEDED (MAXIMUM 3 SPRAYS IN 24 HOURS)   traMADol (ULTRAM) 50 MG tablet, Take 1 tablet (50 mg total) by  mouth every 8 (eight) hours as needed.   Current Outpatient Medications (Other):    blood glucose meter kit and supplies KIT, Dispense based on patient and insurance preference. Use up to four times daily as directed. (FOR ICD-9 250.00, 250.01).   buPROPion (WELLBUTRIN SR) 150 MG 12 hr tablet, Take 150 mg by mouth daily.   clonazePAM (KLONOPIN) 0.5 MG tablet, TAKE 1 TABLET BY MOUTH 2 TIMES DAILY AS NEEDED FOR ANXIETY.   Continuous Blood Gluc Receiver (FREESTYLE LIBRE READER) DEVI, 1 Device by Does not apply route daily as needed (to check blood sugar).   Continuous Blood Gluc Sensor (FREESTYLE LIBRE SENSOR SYSTEM) MISC, Apply 1 sensor to skin every 14 days.   lamoTRIgine (LAMICTAL) 200 MG tablet, Take 200 mg by mouth.    ondansetron (ZOFRAN-ODT) 4 MG disintegrating tablet, DISSOLVE 1 TABLET ON TONGUE EVERY 8 HOURS AS NEEDED FOR NAUSEA AND VOMITING   pantoprazole (PROTONIX) 40 MG tablet, TAKE 1 TABLET BY MOUTH EVERY DAY IN THE EVENING   pregabalin (LYRICA) 150 MG capsule, TAKE 1 CAPSULE BY MOUTH TWICE A DAY   tiZANidine (ZANAFLEX) 4 MG tablet, TAKE ONE TABLET BY MOUTH THREE TIMES DAILY   zolpidem (AMBIEN) 5 MG tablet, Take 1 tablet (5 mg total) by mouth at bedtime as needed for sleep.   Reviewed prior external information including notes and imaging from  primary care provider As well as notes that were available from care everywhere and other healthcare systems.  Past medical history, social, surgical and family history all reviewed in electronic medical record.  No pertanent information unless stated regarding to the chief complaint.   Review of Systems:  No  visual changes, nausea, vomiting, diarrhea, constipation, dizziness, abdominal pain, skin rash, fevers, chills, night sweats, weight loss, swollen lymph nodes,  joint swelling, chest pain, shortness of breath, mood changes. POSITIVE muscle aches, headaches, body aches  Objective  Blood pressure 130/90, pulse 73, height 5' 2"  (1.575  m), weight 211 lb (95.7 kg), last menstrual period 07/14/2020, SpO2 98 %.   General: No apparent distress alert and oriented x3 mood and affect normal, dressed appropriately.  HEENT: Pupils equal, extraocular movements intact  Respiratory: Patient's speak in full sentences and does not appear short of breath  Cardiovascular: No lower extremity edema, non tender, no erythema  Gait normal with good balance and coordination.  MSK: Diffuse tenderness noted in the parascapular regions bilaterally.  Does have tightness of the neck noted as well.  Mild tightness in the lumbar spine as well with tightness with straight leg test.  Neck exam has negative Spurling's.  Osteopathic findings C2 flexed rotated and side  bent left C5 flexed rotated and side bent right T4 extended rotated and side bent right with inhaled rib   Impression and Recommendations:

## 2020-07-21 ENCOUNTER — Encounter: Payer: Self-pay | Admitting: Family Medicine

## 2020-07-21 ENCOUNTER — Ambulatory Visit (INDEPENDENT_AMBULATORY_CARE_PROVIDER_SITE_OTHER): Payer: BC Managed Care – PPO | Admitting: Family Medicine

## 2020-07-21 ENCOUNTER — Other Ambulatory Visit: Payer: Self-pay

## 2020-07-21 ENCOUNTER — Ambulatory Visit (INDEPENDENT_AMBULATORY_CARE_PROVIDER_SITE_OTHER): Payer: BC Managed Care – PPO

## 2020-07-21 VITALS — BP 130/90 | HR 73 | Ht 62.0 in | Wt 211.0 lb

## 2020-07-21 DIAGNOSIS — M9901 Segmental and somatic dysfunction of cervical region: Secondary | ICD-10-CM | POA: Diagnosis not present

## 2020-07-21 DIAGNOSIS — M545 Low back pain, unspecified: Secondary | ICD-10-CM

## 2020-07-21 DIAGNOSIS — M2550 Pain in unspecified joint: Secondary | ICD-10-CM | POA: Diagnosis not present

## 2020-07-21 DIAGNOSIS — M542 Cervicalgia: Secondary | ICD-10-CM

## 2020-07-21 DIAGNOSIS — M9902 Segmental and somatic dysfunction of thoracic region: Secondary | ICD-10-CM

## 2020-07-21 DIAGNOSIS — G4486 Cervicogenic headache: Secondary | ICD-10-CM

## 2020-07-21 DIAGNOSIS — G8929 Other chronic pain: Secondary | ICD-10-CM | POA: Diagnosis not present

## 2020-07-21 DIAGNOSIS — M9903 Segmental and somatic dysfunction of lumbar region: Secondary | ICD-10-CM | POA: Diagnosis not present

## 2020-07-21 LAB — COMPREHENSIVE METABOLIC PANEL
ALT: 9 U/L (ref 0–35)
AST: 10 U/L (ref 0–37)
Albumin: 4.2 g/dL (ref 3.5–5.2)
Alkaline Phosphatase: 76 U/L (ref 39–117)
BUN: 9 mg/dL (ref 6–23)
CO2: 27 mEq/L (ref 19–32)
Calcium: 9.6 mg/dL (ref 8.4–10.5)
Chloride: 103 mEq/L (ref 96–112)
Creatinine, Ser: 0.68 mg/dL (ref 0.40–1.20)
GFR: 105.6 mL/min (ref >60.00)
Glucose, Bld: 139 mg/dL — ABNORMAL HIGH (ref 70–99)
Potassium: 4.5 mEq/L (ref 3.5–5.1)
Sodium: 138 mEq/L (ref 135–145)
Total Bilirubin: 0.4 mg/dL (ref 0.2–1.2)
Total Protein: 7 g/dL (ref 6.0–8.3)

## 2020-07-21 LAB — CBC WITH DIFFERENTIAL/PLATELET
Basophils Absolute: 0 10*3/uL (ref 0.0–0.1)
Basophils Relative: 0.5 % (ref 0.0–3.0)
Eosinophils Absolute: 0.2 10*3/uL (ref 0.0–0.7)
Eosinophils Relative: 2.9 % (ref 0.0–5.0)
HCT: 38.2 % (ref 36.0–46.0)
Hemoglobin: 12.7 g/dL (ref 12.0–15.0)
Lymphocytes Relative: 32.1 % (ref 12.0–46.0)
Lymphs Abs: 2.7 10*3/uL (ref 0.7–4.0)
MCHC: 33.2 g/dL (ref 30.0–36.0)
MCV: 79.2 fl (ref 78.0–100.0)
Monocytes Absolute: 0.6 10*3/uL (ref 0.1–1.0)
Monocytes Relative: 7.3 % (ref 3.0–12.0)
Neutro Abs: 4.8 10*3/uL (ref 1.4–7.7)
Neutrophils Relative %: 57.2 % (ref 43.0–77.0)
Platelets: 309 10*3/uL (ref 150.0–400.0)
RBC: 4.82 Mil/uL (ref 3.87–5.11)
RDW: 14.3 % (ref 11.5–15.5)
WBC: 8.3 10*3/uL (ref 4.0–10.5)

## 2020-07-21 LAB — IBC PANEL
Iron: 66 ug/dL (ref 42–145)
Saturation Ratios: 14.9 % — ABNORMAL LOW (ref 20.0–50.0)
Transferrin: 316 mg/dL (ref 212.0–360.0)

## 2020-07-21 LAB — TSH: TSH: 0.75 u[IU]/mL (ref 0.35–4.50)

## 2020-07-21 LAB — VITAMIN D 25 HYDROXY (VIT D DEFICIENCY, FRACTURES): VITD: 16.47 ng/mL — ABNORMAL LOW (ref 30.00–100.00)

## 2020-07-21 LAB — VITAMIN B12: Vitamin B-12: 227 pg/mL (ref 211–911)

## 2020-07-21 NOTE — Telephone Encounter (Signed)
Dr. Parker, please see message. 

## 2020-07-21 NOTE — Assessment & Plan Note (Signed)
   Decision today to treat with OMT was based on Physical Exam  After verbal consent patient was treated with HVLA, ME, FPR techniques in cervical, thoracic, rib,areas, all areas are chronic   Patient tolerated the procedure well with improvement in symptoms  Patient given exercises, stretches and lifestyle modifications  See medications in patient instructions if given  Patient will follow up in 4-8 weeks 

## 2020-07-21 NOTE — Patient Instructions (Addendum)
Good to see you Back and neck xray today Tart cherry 1200 mg Exercise 3 times a week CBC CMET B12 Vitamin D IRON TSH  See me again in 5-6 weeks

## 2020-07-21 NOTE — Assessment & Plan Note (Signed)
Patient has had cervicogenic headaches.  Patient stopped follow-up with COVID.  Patient has seen other providers for her lower back.  Has responded well to manipulation previously and restarted this.  Patient given more scapular exercises that I think will be beneficial.  Discussed some potential over-the-counter medications that could be helpful.  We will get some laboratory work-up to make sure there is no other abnormalities that could be contributing to some of the pain.  Patient has had gastric bypass previously.  Patient does have some underlying anxiety and depression that patient is working with other specialist as well but knows that that can contribute as well.  Follow-up with me again in 4 to 6 weeks

## 2020-07-22 ENCOUNTER — Other Ambulatory Visit: Payer: Self-pay

## 2020-07-22 MED ORDER — VITAMIN D (ERGOCALCIFEROL) 1.25 MG (50000 UNIT) PO CAPS: 50000.0000 [IU] | ORAL_CAPSULE | ORAL | 0 refills | Status: DC

## 2020-07-22 NOTE — Telephone Encounter (Signed)
Notified patient of message below, she voices understanding, Appointment scheduled.

## 2020-07-24 IMAGING — DX DG CHEST 2V
2 series · 2 of 2 positions shown · non-contrast
Comparison: None.

CLINICAL DATA: Nonradiating chest pain and dyspnea since yesterday.

EXAM:
CHEST - 2 VIEW

[chest pa]
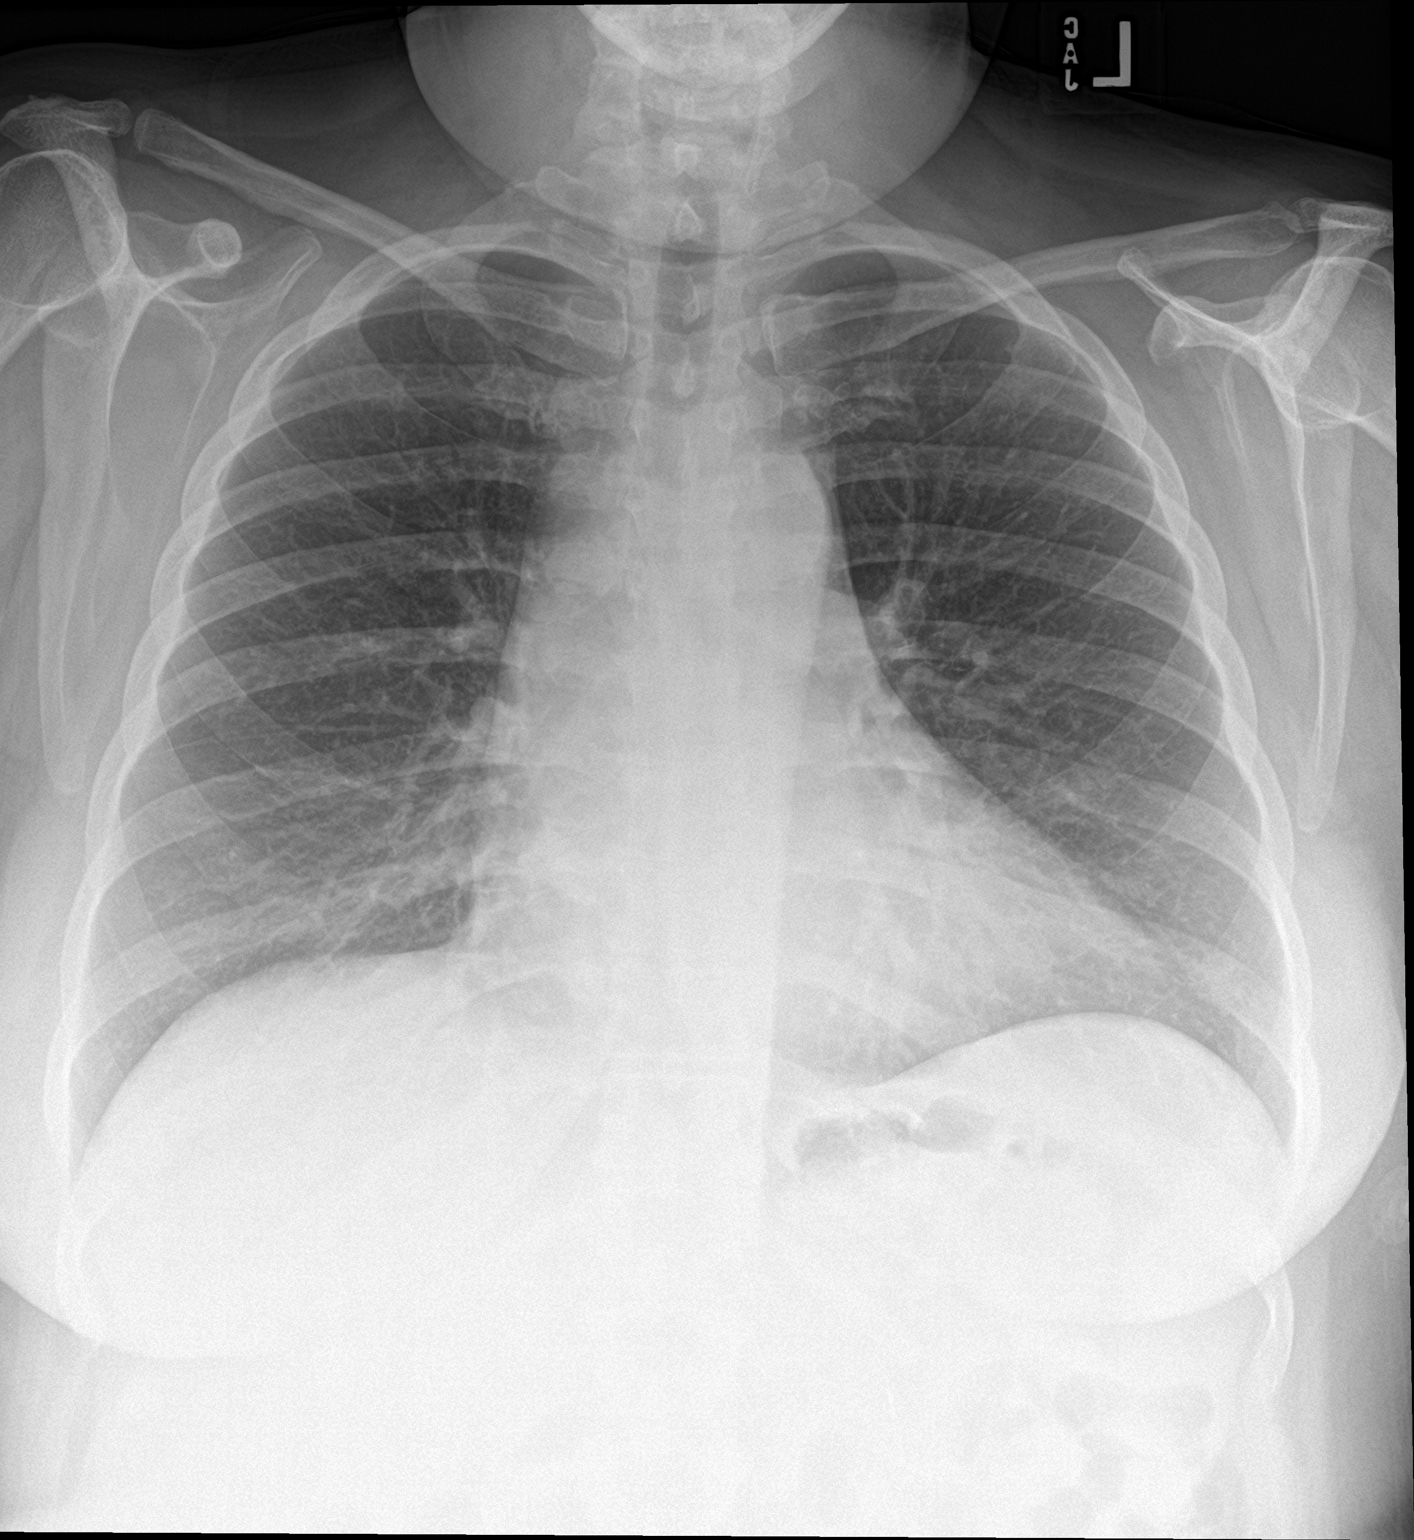

[chest lat]
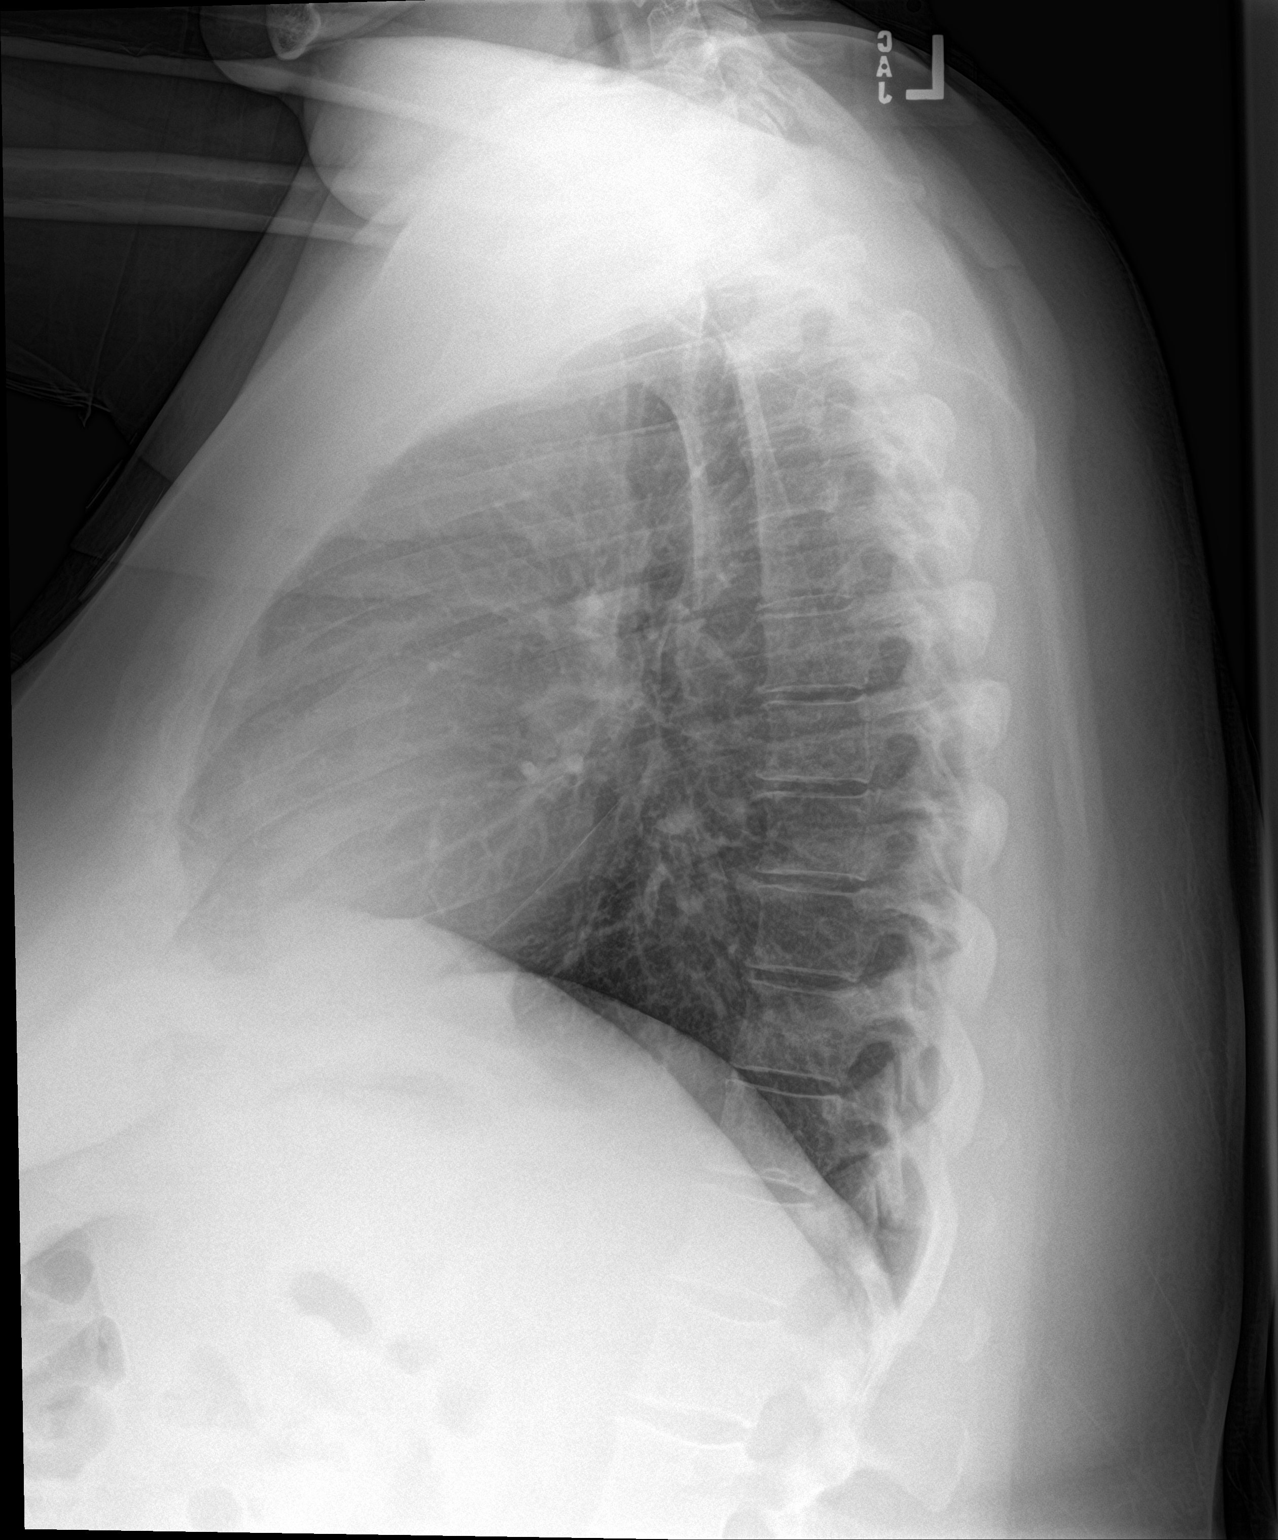

[2 of 2 positions shown; findings below may reference images not displayed]

FINDINGS: Cardiomegaly with mild aortic atherosclerosis. No acute pulmonary
edema, consolidation, effusion or pneumothorax. No acute nor
suspicious osseous abnormality.
IMPRESSION: Cardiomegaly with mild aortic atherosclerosis. No active pulmonary
disease.

## 2020-07-26 ENCOUNTER — Telehealth (INDEPENDENT_AMBULATORY_CARE_PROVIDER_SITE_OTHER): Payer: BC Managed Care – PPO | Admitting: Family Medicine

## 2020-07-26 DIAGNOSIS — E119 Type 2 diabetes mellitus without complications: Secondary | ICD-10-CM | POA: Diagnosis not present

## 2020-07-26 DIAGNOSIS — M25552 Pain in left hip: Secondary | ICD-10-CM | POA: Diagnosis not present

## 2020-07-26 DIAGNOSIS — E538 Deficiency of other specified B group vitamins: Secondary | ICD-10-CM | POA: Diagnosis not present

## 2020-07-26 DIAGNOSIS — G8929 Other chronic pain: Secondary | ICD-10-CM

## 2020-07-26 DIAGNOSIS — M5442 Lumbago with sciatica, left side: Secondary | ICD-10-CM

## 2020-07-26 DIAGNOSIS — J309 Allergic rhinitis, unspecified: Secondary | ICD-10-CM

## 2020-07-26 MED ORDER — AZELASTINE HCL 0.1 % NA SOLN: 2.0000 | Freq: Two times a day (BID) | NASAL | 5 refills | Status: DC

## 2020-07-26 MED ORDER — AMOXICILLIN-POT CLAVULANATE 875-125 MG PO TABS: 1.0000 | ORAL_TABLET | Freq: Two times a day (BID) | ORAL | 0 refills | Status: DC

## 2020-07-26 MED ORDER — TRAMADOL HCL 50 MG PO TABS: 50.0000 mg | ORAL_TABLET | Freq: Three times a day (TID) | ORAL | 5 refills | Status: DC | PRN

## 2020-07-26 MED ORDER — PREDNISONE 50 MG PO TABS
ORAL_TABLET | ORAL | 0 refills | Status: DC
Start: 2020-07-26 — End: 2020-10-24

## 2020-07-26 NOTE — Assessment & Plan Note (Signed)
Database with and without red flags.  She has been taking tramadol 50 mg 3 times daily.  She has been following with sports medicine and orthopedics and will be following with physical therapy soon.  She will be getting epidural injections in the next couple of months.  We will refill tramadol 50 mg 3 times daily.

## 2020-07-26 NOTE — Assessment & Plan Note (Signed)
Recent flareup.  Has had a lot of sinus congestion the last couple weeks.  Concern for sinus infection as well.  We will refill Astelin today and treat sinus infection as above.

## 2020-07-26 NOTE — Assessment & Plan Note (Signed)
Recommended over-the-counter 1000 mcg supplementation daily.  We can recheck in 3 to 6 months.

## 2020-07-26 NOTE — Progress Notes (Signed)
   Heidi Holland is a 45 y.o. female who presents today for a virtual office visit.  Assessment/Plan:  New/Acute Problems: Sinusitis Given the symptoms of been persistent for 2 weeks we will start antibiotics and prednisone.  Continue Astelin.  Can use over-the-counter meds as needed as well.  Chronic Problems Addressed Today: B12 deficiency Recommended over-the-counter 1000 mcg supplementation daily.  We can recheck in 3 to 6 months.  Allergic rhinitis Recent flareup.  Has had a lot of sinus congestion the last couple weeks.  Concern for sinus infection as well.  We will refill Astelin today and treat sinus infection as above.  T2DM (type 2 diabetes mellitus) (HCC) Last A1c at goal.  Advised patient blood sugar may increase with prednisone that we are using for her sinus infection.  Low back pain Database with and without red flags.  She has been taking tramadol 50 mg 3 times daily.  She has been following with sports medicine and orthopedics and will be following with physical therapy soon.  She will be getting epidural injections in the next couple of months.  We will refill tramadol 50 mg 3 times daily.     Subjective:  HPI:  See A/p.         Objective/Observations  Physical Exam: Gen: NAD, resting comfortably Pulm: Normal work of breathing Neuro: Grossly normal, moves all extremities Psych: Normal affect and thought content  Virtual Visit via Video   I connected with Heidi Holland on 07/26/20 at  3:40 PM EDT by a video enabled telemedicine application and verified that I am speaking with the correct person using two identifiers. The limitations of evaluation and management by telemedicine and the availability of in person appointments were discussed. The patient expressed understanding and agreed to proceed.   Patient location: Home Provider location: [place] [employer] Persons participating in the virtual visit: Myself and Patient     [name]. [name],  MD 07/26/2020 12:36 PM

## 2020-07-26 NOTE — Assessment & Plan Note (Signed)
Last A1c at goal.  Advised patient blood sugar may increase with prednisone that we are using for her sinus infection.

## 2020-07-28 ENCOUNTER — Encounter (HOSPITAL_COMMUNITY): Payer: Self-pay | Admitting: *Deleted

## 2020-07-28 ENCOUNTER — Emergency Department (HOSPITAL_COMMUNITY): Payer: BC Managed Care – PPO

## 2020-07-28 ENCOUNTER — Emergency Department (HOSPITAL_COMMUNITY)
Admission: EM | Admit: 2020-07-28 | Discharge: 2020-07-28 | Disposition: A | Discharge status: Home / Self Care | Payer: BC Managed Care – PPO | Attending: Emergency Medicine | Admitting: Emergency Medicine

## 2020-07-28 ENCOUNTER — Other Ambulatory Visit: Payer: Self-pay

## 2020-07-28 DIAGNOSIS — R079 Chest pain, unspecified: Secondary | ICD-10-CM | POA: Diagnosis not present

## 2020-07-28 DIAGNOSIS — Z5321 Procedure and treatment not carried out due to patient leaving prior to being seen by health care provider: Secondary | ICD-10-CM | POA: Insufficient documentation

## 2020-07-28 DIAGNOSIS — K5750 Diverticulosis of both small and large intestine without perforation or abscess without bleeding: Secondary | ICD-10-CM | POA: Diagnosis not present

## 2020-07-28 DIAGNOSIS — R140 Abdominal distension (gaseous): Secondary | ICD-10-CM | POA: Insufficient documentation

## 2020-07-28 DIAGNOSIS — M549 Dorsalgia, unspecified: Secondary | ICD-10-CM | POA: Diagnosis not present

## 2020-07-28 DIAGNOSIS — N281 Cyst of kidney, acquired: Secondary | ICD-10-CM | POA: Diagnosis not present

## 2020-07-28 DIAGNOSIS — I700 Atherosclerosis of aorta: Secondary | ICD-10-CM | POA: Diagnosis not present

## 2020-07-28 DIAGNOSIS — R1010 Upper abdominal pain, unspecified: Secondary | ICD-10-CM | POA: Diagnosis not present

## 2020-07-28 LAB — CBC WITH DIFFERENTIAL/PLATELET
Abs Immature Granulocytes: 0.07 10*3/uL (ref 0.00–0.07)
Basophils Absolute: 0.1 10*3/uL (ref 0.0–0.1)
Basophils Relative: 1 %
Eosinophils Absolute: 0.3 10*3/uL (ref 0.0–0.5)
Eosinophils Relative: 3 %
HCT: 40.1 % (ref 36.0–46.0)
Hemoglobin: 12.7 g/dL (ref 12.0–15.0)
Immature Granulocytes: 1 %
Lymphocytes Relative: 29 %
Lymphs Abs: 2.3 10*3/uL (ref 0.7–4.0)
MCH: 26.3 pg (ref 26.0–34.0)
MCHC: 31.7 g/dL (ref 30.0–36.0)
MCV: 83 fL (ref 80.0–100.0)
Monocytes Absolute: 0.6 10*3/uL (ref 0.1–1.0)
Monocytes Relative: 8 %
Neutro Abs: 4.8 10*3/uL (ref 1.7–7.7)
Neutrophils Relative %: 58 %
Platelets: 315 10*3/uL (ref 150–400)
RBC: 4.83 MIL/uL (ref 3.87–5.11)
RDW: 13.6 % (ref 11.5–15.5)
WBC: 8.1 10*3/uL (ref 4.0–10.5)
nRBC: 0 % (ref 0.0–0.2)

## 2020-07-28 LAB — URINALYSIS, ROUTINE W REFLEX MICROSCOPIC
Bilirubin Urine: NEGATIVE
Glucose, UA: NEGATIVE mg/dL
Hgb urine dipstick: NEGATIVE
Ketones, ur: NEGATIVE mg/dL
Leukocytes,Ua: NEGATIVE
Nitrite: NEGATIVE
Protein, ur: NEGATIVE mg/dL
Specific Gravity, Urine: 1.01 (ref 1.005–1.030)
pH: 5 (ref 5.0–8.0)

## 2020-07-28 LAB — COMPREHENSIVE METABOLIC PANEL
ALT: 14 U/L (ref 0–44)
AST: 14 U/L — ABNORMAL LOW (ref 15–41)
Albumin: 4 g/dL (ref 3.5–5.0)
Alkaline Phosphatase: 73 U/L (ref 38–126)
Anion gap: 9 (ref 5–15)
BUN: 9 mg/dL (ref 6–20)
CO2: 20 mmol/L — ABNORMAL LOW (ref 22–32)
Calcium: 9.6 mg/dL (ref 8.9–10.3)
Chloride: 109 mmol/L (ref 98–111)
Creatinine, Ser: 0.61 mg/dL (ref 0.44–1.00)
GFR, Estimated: >60 mL/min (ref >60)
Glucose, Bld: 110 mg/dL — ABNORMAL HIGH (ref 70–99)
Potassium: 4.5 mmol/L (ref 3.5–5.1)
Sodium: 138 mmol/L (ref 135–145)
Total Bilirubin: 0.5 mg/dL (ref 0.3–1.2)
Total Protein: 6.9 g/dL (ref 6.5–8.1)

## 2020-07-28 LAB — LIPASE, BLOOD: Lipase: 28 U/L (ref 11–51)

## 2020-07-28 LAB — I-STAT BETA HCG BLOOD, ED (MC, WL, AP ONLY): I-stat hCG, quantitative: <5 m[IU]/mL (ref <5)

## 2020-07-28 NOTE — ED Triage Notes (Signed)
Pt reports hx of gastric bypass. Having back pain and taking advil for several days. For past 24 hrs having abd distention, pain and now chest pain.

## 2020-07-28 NOTE — ED Provider Notes (Signed)
Emergency Medicine Provider Triage Evaluation Note  Heidi Holland , a 45 y.o. female  was evaluated in triage.  Pt complains of abdominal pain and distention over the last 24 hours.  States pain is across the upper abdomen and radiates throughout the abdomen and she thinks may be due to her taking ibuprofen.  Review of Systems  Positive: Abdominal pain, distention Negative: Vomiting, diarrhea, hematochezia/melena, urinary symptoms, fever  Physical Exam  BP (!) 154/86 (BP Location: Left Arm)   Pulse 83   Temp 98.4 F (36.9 C) (Oral)   Resp 16   LMP 07/14/2020 (Approximate)   SpO2 96%  Gen:   Awake, no distress   Resp:  Normal effort  MSK:   Moves extremities without difficulty  Other:  Tenderness mostly in the upper abdomen, seems to be worst in the epigastric region  Medical Decision Making  Medically screening exam initiated at 1:49 PM.  Appropriate orders placed.  Heidi Holland was informed that the remainder of the evaluation will be completed by another provider, this initial triage assessment does not replace that evaluation, and the importance of remaining in the ED until their evaluation is complete.  This patient was evaluated during a time of global shortage of iodinated contrast media. Based on guidance from the [employer] of Radiology, best practices, and local institutional approaches an alternative path for evaluating and managing the patient may have been employed in order to provide optimal care during this shortage. The current situation has been discussed with the patient.   Anselm Pancoast, PA-C 07/28/20 1355    Koleen Distance, MD 07/28/20 [phone]

## 2020-07-28 NOTE — ED Notes (Signed)
Pt stated that she did not want to wait any longer and is leaving. Pt seen leaving ED entrance.

## 2020-07-29 ENCOUNTER — Ambulatory Visit (INDEPENDENT_AMBULATORY_CARE_PROVIDER_SITE_OTHER): Payer: BC Managed Care – PPO | Admitting: Neurology

## 2020-07-29 DIAGNOSIS — G43709 Chronic migraine without aura, not intractable, without status migrainosus: Secondary | ICD-10-CM

## 2020-07-29 MED ORDER — ONABOTULINUMTOXINA 100 UNITS IJ SOLR
200.0000 [IU] | Freq: Once | INTRAMUSCULAR | Status: AC
  Administered 2020-07-29: 155 [IU] via INTRAMUSCULAR

## 2020-07-29 NOTE — Procedures (Deleted)
[facility]   Procedure Note Botox  Attending: Dr. Breda Bond  Preoperative Diagnosis(es): Chronic migraine  Consent obtained from: The patient Benefits discussed included, but were not limited to decreased muscle tightness, increased joint range of motion, and decreased pain.  Risk discussed included, but were not limited pain and discomfort, bleeding, bruising, excessive weakness, venous thrombosis, muscle atrophy and dysphagia.  Anticipated outcomes of the procedure as well as he risks and benefits of the alternatives to the procedure, and the roles and tasks of the personnel to be involved, were discussed with the patient, and the patient consents to the procedure and agrees to proceed. A copy of the patient medication guide was given to the patient which explains the blackbox warning.  Patients identity and treatment sites confirmed Yes.  .  Details of Procedure: Skin was cleaned with alcohol. Prior to injection, the needle plunger was aspirated to make sure the needle was not within a blood vessel.  There was no blood retrieved on aspiration.    Following is a summary of the muscles injected  And the amount of Botulinum toxin used:  Dilution 200 units of Botox was reconstituted with 4 ml of preservative free normal saline. Time of reconstitution: At the time of the office visit (<30 minutes prior to injection)   Injections  155 total units of Botox was injected with a 30 gauge needle.  Injection Sites: L occipitalis: 15 units- 3 sites  R occiptalis: 15 units- 3 sites  L upper trapezius: 15 units- 3 sites R upper trapezius: 15 units- 3 sits          L paraspinal: 10 units- 2 sites R paraspinal: 10 units- 2 sites  Face L frontalis(2 injection sites):10 units   R frontalis(2 injection sites):10 units         L corrugator: 5 units   R corrugator: 5 units           Procerus: 5 units   L temporalis: 20 units R temporalis: 20 units   Agent:  200 units of botulinum Type  A (Onobotulinum Toxin type A) was reconstituted with 4 ml of preservative free normal saline.  Time of reconstitution: At the time of the office visit (<30 minutes prior to injection)     Total injected (Units): 155  Total wasted (Units): ***  Patient tolerated procedure well without complications.   Reinjection is anticipated in 3 months.     

## 2020-07-29 NOTE — Progress Notes (Signed)
[facility]   Procedure Note Botox  Attending: Dr. Ziaire Hagos  Preoperative Diagnosis(es): Chronic migraine  Consent obtained from: The patient Benefits discussed included, but were not limited to decreased muscle tightness, increased joint range of motion, and decreased pain.  Risk discussed included, but were not limited pain and discomfort, bleeding, bruising, excessive weakness, venous thrombosis, muscle atrophy and dysphagia.  Anticipated outcomes of the procedure as well as he risks and benefits of the alternatives to the procedure, and the roles and tasks of the personnel to be involved, were discussed with the patient, and the patient consents to the procedure and agrees to proceed. A copy of the patient medication guide was given to the patient which explains the blackbox warning.  Patients identity and treatment sites confirmed Yes.  .  Details of Procedure: Skin was cleaned with alcohol. Prior to injection, the needle plunger was aspirated to make sure the needle was not within a blood vessel.  There was no blood retrieved on aspiration.    Following is a summary of the muscles injected  And the amount of Botulinum toxin used:  Dilution 200 units of Botox was reconstituted with 4 ml of preservative free normal saline. Time of reconstitution: At the time of the office visit (<30 minutes prior to injection)   Injections  155 total units of Botox was injected with a 30 gauge needle.  Injection Sites: L occipitalis: 15 units- 3 sites  R occiptalis: 15 units- 3 sites  L upper trapezius: 15 units- 3 sites R upper trapezius: 15 units- 3 sits          L paraspinal: 10 units- 2 sites R paraspinal: 10 units- 2 sites  Face L frontalis(2 injection sites):10 units   R frontalis(2 injection sites):10 units         L corrugator: 5 units   R corrugator: 5 units           Procerus: 5 units   L temporalis: 20 units R temporalis: 20 units   Agent:  200 units of botulinum Type  A (Onobotulinum Toxin type A) was reconstituted with 4 ml of preservative free normal saline.  Time of reconstitution: At the time of the office visit (<30 minutes prior to injection)     Total injected (Units): 155  Total wasted (Units): none wasted  Patient tolerated procedure well without complications.   Reinjection is anticipated in 3 months.    

## 2020-07-30 ENCOUNTER — Encounter: Payer: Self-pay | Admitting: Family Medicine

## 2020-08-03 ENCOUNTER — Encounter (HOSPITAL_COMMUNITY): Payer: Self-pay | Admitting: Emergency Medicine

## 2020-08-03 ENCOUNTER — Other Ambulatory Visit: Payer: Self-pay

## 2020-08-03 ENCOUNTER — Emergency Department (HOSPITAL_COMMUNITY): Payer: BC Managed Care – PPO

## 2020-08-03 ENCOUNTER — Emergency Department (HOSPITAL_COMMUNITY)
Admission: EM | Admit: 2020-08-03 | Discharge: 2020-08-04 | Disposition: A | Discharge status: Home / Self Care | Payer: BC Managed Care – PPO | Attending: Emergency Medicine | Admitting: Emergency Medicine

## 2020-08-03 DIAGNOSIS — N83201 Unspecified ovarian cyst, right side: Secondary | ICD-10-CM | POA: Insufficient documentation

## 2020-08-03 DIAGNOSIS — J45909 Unspecified asthma, uncomplicated: Secondary | ICD-10-CM | POA: Insufficient documentation

## 2020-08-03 DIAGNOSIS — R1032 Left lower quadrant pain: Secondary | ICD-10-CM

## 2020-08-03 DIAGNOSIS — Z7984 Long term (current) use of oral hypoglycemic drugs: Secondary | ICD-10-CM | POA: Insufficient documentation

## 2020-08-03 DIAGNOSIS — Z96642 Presence of left artificial hip joint: Secondary | ICD-10-CM | POA: Diagnosis not present

## 2020-08-03 DIAGNOSIS — R109 Unspecified abdominal pain: Secondary | ICD-10-CM | POA: Diagnosis not present

## 2020-08-03 DIAGNOSIS — E119 Type 2 diabetes mellitus without complications: Secondary | ICD-10-CM | POA: Diagnosis not present

## 2020-08-03 DIAGNOSIS — K5900 Constipation, unspecified: Secondary | ICD-10-CM | POA: Diagnosis not present

## 2020-08-03 DIAGNOSIS — R0602 Shortness of breath: Secondary | ICD-10-CM | POA: Diagnosis not present

## 2020-08-03 LAB — URINALYSIS, ROUTINE W REFLEX MICROSCOPIC
Bilirubin Urine: NEGATIVE
Glucose, UA: NEGATIVE mg/dL
Hgb urine dipstick: NEGATIVE
Ketones, ur: 5 mg/dL — AB
Leukocytes,Ua: NEGATIVE
Nitrite: NEGATIVE
Protein, ur: NEGATIVE mg/dL
Specific Gravity, Urine: 1.018 (ref 1.005–1.030)
pH: 5 (ref 5.0–8.0)

## 2020-08-03 LAB — CBC WITH DIFFERENTIAL/PLATELET
Abs Immature Granulocytes: 0.15 10*3/uL — ABNORMAL HIGH (ref 0.00–0.07)
Basophils Absolute: 0.1 10*3/uL (ref 0.0–0.1)
Basophils Relative: 1 %
Eosinophils Absolute: 0.2 10*3/uL (ref 0.0–0.5)
Eosinophils Relative: 1 %
HCT: 40.7 % (ref 36.0–46.0)
Hemoglobin: 13.2 g/dL (ref 12.0–15.0)
Immature Granulocytes: 1 %
Lymphocytes Relative: 31 %
Lymphs Abs: 3.9 10*3/uL (ref 0.7–4.0)
MCH: 26.5 pg (ref 26.0–34.0)
MCHC: 32.4 g/dL (ref 30.0–36.0)
MCV: 81.7 fL (ref 80.0–100.0)
Monocytes Absolute: 0.8 10*3/uL (ref 0.1–1.0)
Monocytes Relative: 6 %
Neutro Abs: 7.4 10*3/uL (ref 1.7–7.7)
Neutrophils Relative %: 60 %
Platelets: 332 10*3/uL (ref 150–400)
RBC: 4.98 MIL/uL (ref 3.87–5.11)
RDW: 14 % (ref 11.5–15.5)
WBC: 12.5 10*3/uL — ABNORMAL HIGH (ref 4.0–10.5)
nRBC: 0 % (ref 0.0–0.2)

## 2020-08-03 LAB — COMPREHENSIVE METABOLIC PANEL
ALT: 13 U/L (ref 0–44)
AST: 14 U/L — ABNORMAL LOW (ref 15–41)
Albumin: 4.3 g/dL (ref 3.5–5.0)
Alkaline Phosphatase: 67 U/L (ref 38–126)
Anion gap: 10 (ref 5–15)
BUN: 15 mg/dL (ref 6–20)
CO2: 22 mmol/L (ref 22–32)
Calcium: 9.4 mg/dL (ref 8.9–10.3)
Chloride: 105 mmol/L (ref 98–111)
Creatinine, Ser: 0.72 mg/dL (ref 0.44–1.00)
GFR, Estimated: >60 mL/min (ref >60)
Glucose, Bld: 145 mg/dL — ABNORMAL HIGH (ref 70–99)
Potassium: 3.7 mmol/L (ref 3.5–5.1)
Sodium: 137 mmol/L (ref 135–145)
Total Bilirubin: 0.3 mg/dL (ref 0.3–1.2)
Total Protein: 7.2 g/dL (ref 6.5–8.1)

## 2020-08-03 LAB — LIPASE, BLOOD: Lipase: 35 U/L (ref 11–51)

## 2020-08-03 LAB — I-STAT BETA HCG BLOOD, ED (MC, WL, AP ONLY): I-stat hCG, quantitative: <5 m[IU]/mL (ref <5)

## 2020-08-03 NOTE — ED Provider Notes (Signed)
Emergency Medicine Provider Triage Evaluation Note  Heidi Holland 44 y.o. female was evaluated in triage.  Pt complains of abdominal bloating, abdominal pain.  She reports some nausea but no vomiting.  She reports that she has not had a bowel movement in several days.  She has not noted any fevers.  She reports that she is short of breath.  She has not any chest pains but sometimes feels like she has palpitations.  No fevers.  She has a history of gastric bypass.   Review of Systems  Positive: Abd pain, bloating, nausea, SOB Negative: Fevers, vomiting, diarrhea   Physical Exam  BP 134/82   Pulse 70   Temp 98.2 F (36.8 C) (Oral)   Resp 18   Ht 5\' 4"  (1.626 m)   Wt 65.8 kg   SpO2 100%   BMI 24.89 kg/m  Gen:   Awake, no distress   HEENT:  Atraumatic  Resp:  Normal effort  Cardiac:  Normal rate  Abd:   Nondistended, generalized tenderness.  No focal point.  No rigidity, guarding. MSK:   Moves extremities without difficulty  Neuro:  Speech clear   Other:      Medical Decision Making  Medically screening exam initiated at 8:34 PM  Appropriate orders placed.  Heidi Holland was informed that the remainder of the evaluation will be completed by another provider, this initial triage assessment does not replace that evaluation. They are counseled that they will need to remain in the ED until the completion of their workup, including full H&P and results of any tests.  Risks of leaving the emergency department prior to completion of treatment were discussed. Patient was advised to inform ED staff if they are leaving before their treatment is complete. The patient acknowledged these risks and time was allowed for questions.     The patient appears stable so that the remainder of the MSE may be completed by another provider.    Clinical Impression  Abd pain   Portions of this note were generated with Dragon dictation software. Dictation errors may occur despite best attempts at  proofreading.     Heidi Ahmadi, PA-C 08/03/20 2035    2036, MD 08/03/20 2227

## 2020-08-03 NOTE — ED Triage Notes (Addendum)
Pt arriving with abdominal pain, back pain and SOB. Pt reports the back pain is chronic and is due for injections on July 5th for such. Abdominal pain began a couple days ago. Has nausea but no vomiting. Hx gastric bypass in 2016.

## 2020-08-04 ENCOUNTER — Emergency Department (HOSPITAL_COMMUNITY): Payer: BC Managed Care – PPO

## 2020-08-04 ENCOUNTER — Encounter (HOSPITAL_COMMUNITY): Payer: Self-pay

## 2020-08-04 ENCOUNTER — Ambulatory Visit: Payer: BC Managed Care – PPO | Admitting: Family Medicine

## 2020-08-04 DIAGNOSIS — R109 Unspecified abdominal pain: Secondary | ICD-10-CM | POA: Diagnosis not present

## 2020-08-04 MED ORDER — ONDANSETRON HCL 4 MG/2ML IJ SOLN
4.0000 mg | Freq: Once | INTRAMUSCULAR | Status: AC
  Administered 2020-08-04: 4 mg via INTRAVENOUS
  Filled 2020-08-04: qty 2

## 2020-08-04 MED ORDER — FENTANYL CITRATE (PF) 100 MCG/2ML IJ SOLN
100.0000 ug | Freq: Once | INTRAMUSCULAR | Status: AC
  Administered 2020-08-04: 100 ug via INTRAVENOUS
  Filled 2020-08-04: qty 2

## 2020-08-04 MED ORDER — SODIUM CHLORIDE (PF) 0.9 % IJ SOLN
INTRAMUSCULAR | Status: AC
  Filled 2020-08-04: qty 50

## 2020-08-04 MED ORDER — IOHEXOL 300 MG/ML  SOLN
100.0000 mL | Freq: Once | INTRAMUSCULAR | Status: AC | PRN
  Administered 2020-08-04: 100 mL via INTRAVENOUS

## 2020-08-04 NOTE — ED Provider Notes (Signed)
[facility] DEPT Provider Note: Georgena Spurling, MD, FACEP  CSN: [id number] MRN: [id number] ARRIVAL: 08/03/20 at [place]: [place]  Abdominal Pain   HISTORY OF PRESENT ILLNESS  08/04/20 1:02 AM Heidi Holland is a 45 y.o. female with history of chronic back pain managed with tramadol and ibuprofen.  She is here with abdominal pain that began several days ago.  The pain is primarily in her left lower quadrant, and is sharp in nature.  She rates it as an 8 out of 10.  It is worse with palpation or movement.  When she eats she feels bloated and nauseated but has not vomited.  She had some difficulty breathing earlier but thinks that is because her abdomen was distended.  She has a history of gastric bypass as well as diverticulitis.  She has had decreased frequency of bowel movements, decreased appetite and decreased oral intake.   Past Medical History:  Diagnosis Date   Allergy    Anxiety    Asthma    Depression    Diabetes mellitus without complication ([facility])    Diverticulitis    GERD (gastroesophageal reflux disease)    HPV in female    Hyperlipidemia    Migraines     Past Surgical History:  Procedure Laterality Date   BOWEL RESECTION     gasticbypass     GASTRIC BYPASS     KNEE ARTHROSCOPY     TOTAL HIP ARTHROPLASTY Left     Family History  Problem Relation Age of Onset   Heart disease Mother    Lung cancer Mother    Rheum arthritis Mother    Arthritis Mother    Cancer Mother    COPD Mother    27 / [place] Mother    Diabetes Father    Heart disease Father    Breast cancer Paternal Aunt     Social History   Tobacco Use   Smoking status: Never   Smokeless tobacco: Never  Vaping Use   Vaping Use: Never used  Substance Use Topics   Alcohol use: Yes    Comment: Occ   Drug use: Yes    Types: Marijuana    Comment: THC    Prior to Admission medications   Medication Sig Start Date End Date Taking? Authorizing Provider   acetaminophen (TYLENOL) 325 MG tablet Take 650 mg by mouth every 6 (six) hours as needed for mild pain.    [provider]  albuterol (VENTOLIN HFA) 108 (90 Base) MCG/ACT inhaler INHALE 2 PUFFS BY MOUTH EVERY 6 HOURS AS NEEDED FOR WHEEZE OR SHORTNESS OF BREATH 01/06/20   Vivi Barrack, MD  amoxicillin-clavulanate (AUGMENTIN) 875-125 MG tablet Take 1 tablet by mouth 2 (two) times daily. 07/26/20   Vivi Barrack, MD  azelastine (ASTELIN) 0.1 % nasal spray Place 2 sprays into both nostrils 2 (two) times daily. Use in each nostril as directed 07/26/20   Vivi Barrack, MD  blood glucose meter kit and supplies KIT Dispense based on patient and insurance preference. Use up to four times daily as directed. (FOR ICD-9 250.00, 250.01). 07/31/18   Vivi Barrack, MD  buPROPion [facility] SR) 150 MG 12 hr tablet Take 150 mg by mouth daily.    [provider]  clonazePAM (KLONOPIN) 0.5 MG tablet TAKE 1 TABLET BY MOUTH 2 TIMES DAILY AS NEEDED FOR ANXIETY. 02/15/20   Vivi Barrack, MD  Continuous Blood Gluc Receiver (FREESTYLE LIBRE READER) DEVI 1 Device  by Does not apply route daily as needed (to check blood sugar). 08/14/18   Vivi Barrack, MD  Continuous Blood Gluc Sensor (FREESTYLE LIBRE SENSOR SYSTEM) MISC Apply 1 sensor to skin every 14 days. 08/14/18   Vivi Barrack, MD  lamoTRIgine (LAMICTAL) 200 MG tablet Take 200 mg by mouth.  07/09/19   [provider]  metFORMIN (GLUCOPHAGE) 1000 MG tablet TAKE 1 TABLET BY MOUTH TWICE A DAY WITH A MEAL 12/31/19   Vivi Barrack, MD  ondansetron (ZOFRAN-ODT) 4 MG disintegrating tablet DISSOLVE 1 TABLET ON TONGUE EVERY 8 HOURS AS NEEDED FOR NAUSEA AND VOMITING 02/15/20   Vivi Barrack, MD  pantoprazole (PROTONIX) 40 MG tablet TAKE 1 TABLET BY MOUTH EVERY DAY IN THE EVENING 03/11/20   Vivi Barrack, MD  predniSONE (DELTASONE) 50 MG tablet Take 1 tablet daily for 5 days. 07/26/20   Vivi Barrack, MD  pregabalin (LYRICA) 150 MG capsule TAKE  1 CAPSULE BY MOUTH TWICE A DAY 05/25/20   Vivi Barrack, MD  sodium chloride (OCEAN) 0.65 % SOLN nasal spray Place 1 spray into both nostrils as needed for congestion. 09/08/19   Darr, Edison Nasuti, PA-C  tiZANidine (ZANAFLEX) 4 MG tablet TAKE ONE TABLET BY MOUTH THREE TIMES DAILY 07/14/20   Vivi Barrack, MD  TOSYMRA 10 MG/ACT SOLN PLACE 1 SPRAY INTO THE NOSE EVERY HOUR AS NEEDED (MAXIMUM 3 SPRAYS IN 24 HOURS) Patient not taking: Reported on 07/26/2020 11/16/19   Pieter Partridge, DO  traMADol (ULTRAM) 50 MG tablet Take 1 tablet (50 mg total) by mouth every 8 (eight) hours as needed. 07/26/20   Vivi Barrack, MD  Vitamin D, Ergocalciferol, (DRISDOL) 1.25 MG (50000 UNIT) CAPS capsule Take 1 capsule (50,000 Units total) by mouth every 7 (seven) days. 07/22/20   Lyndal Pulley, DO  zolpidem (AMBIEN) 5 MG tablet Take 1 tablet (5 mg total) by mouth at bedtime as needed for sleep. 10/23/19   Vivi Barrack, MD    Allergies Ciprofloxacin   REVIEW OF SYSTEMS  Negative except as noted here or in the History of Present Illness.   PHYSICAL EXAMINATION  Initial Vital Signs Blood pressure (!) 164/108, pulse 79, temperature 98.2 F (36.8 C), temperature source Oral, resp. rate 18, last menstrual period 07/14/2020, SpO2 97 %.  Examination General: Well-developed, well-nourished female in no acute distress; appearance consistent with age of record HENT: normocephalic; atraumatic Eyes: Normal appearance Neck: supple Heart: regular rate and rhythm Lungs: clear to auscultation bilaterally Abdomen: soft; distended; left lower quadrant tenderness; bowel sounds present Extremities: No deformity; full range of motion; pulses normal Neurologic: Awake, alert and oriented; motor function intact in all extremities and symmetric; no facial droop Skin: Warm and dry Psychiatric: Normal mood and affect   RESULTS  Summary of this visit's results, reviewed and interpreted by myself:   EKG  Interpretation  Date/Time:    Ventricular Rate:    PR Interval:    QRS Duration:   QT Interval:    QTC Calculation:   R Axis:     Text Interpretation:          Laboratory Studies: Results for orders placed or performed during the hospital encounter of 08/03/20 (from the past 24 hour(s))  Comprehensive metabolic panel     Status: Abnormal   Collection Time: 08/03/20  9:01 PM  Result Value Ref Range   Sodium 137 135 - 145 mmol/L   Potassium 3.7 3.5 - 5.1 mmol/L  Chloride 105 98 - 111 mmol/L   CO2 22 22 - 32 mmol/L   Glucose, Bld 145 (H) 70 - 99 mg/dL   BUN 15 6 - 20 mg/dL   Creatinine, Ser 0.72 0.44 - 1.00 mg/dL   Calcium 9.4 8.9 - 10.3 mg/dL   Total Protein 7.2 6.5 - 8.1 g/dL   Albumin 4.3 3.5 - 5.0 g/dL   AST 14 (L) 15 - 41 U/L   ALT 13 0 - 44 U/L   Alkaline Phosphatase 67 38 - 126 U/L   Total Bilirubin 0.3 0.3 - 1.2 mg/dL   GFR, Estimated >60 >60 mL/min   Anion gap 10 5 - 15  CBC with Differential     Status: Abnormal   Collection Time: 08/03/20  9:01 PM  Result Value Ref Range   WBC 12.5 (H) 4.0 - 10.5 K/uL   RBC 4.98 3.87 - 5.11 MIL/uL   Hemoglobin 13.2 12.0 - 15.0 g/dL   HCT 40.7 36.0 - 46.0 %   MCV 81.7 80.0 - 100.0 fL   MCH 26.5 26.0 - 34.0 pg   MCHC 32.4 30.0 - 36.0 g/dL   RDW 14.0 11.5 - 15.5 %   Platelets 332 150 - 400 K/uL   nRBC 0.0 0.0 - 0.2 %   Neutrophils Relative % 60 %   Neutro Abs 7.4 1.7 - 7.7 K/uL   Lymphocytes Relative 31 %   Lymphs Abs 3.9 0.7 - 4.0 K/uL   Monocytes Relative 6 %   Monocytes Absolute 0.8 0.1 - 1.0 K/uL   Eosinophils Relative 1 %   Eosinophils Absolute 0.2 0.0 - 0.5 K/uL   Basophils Relative 1 %   Basophils Absolute 0.1 0.0 - 0.1 K/uL   Immature Granulocytes 1 %   Abs Immature Granulocytes 0.15 (H) 0.00 - 0.07 K/uL  Lipase, blood     Status: None   Collection Time: 08/03/20  9:01 PM  Result Value Ref Range   Lipase 35 11 - 51 U/L  I-Stat beta hCG blood, ED     Status: None   Collection Time: 08/03/20  9:09 PM   Result Value Ref Range   I-stat hCG, quantitative <5.0 <5 mIU/mL   Comment 3          Urinalysis, Routine w reflex microscopic Urine, Clean Catch     Status: Abnormal   Collection Time: 08/03/20  9:12 PM  Result Value Ref Range   Color, Urine YELLOW YELLOW   APPearance CLEAR CLEAR   Specific Gravity, Urine 1.018 1.005 - 1.030   pH 5.0 5.0 - 8.0   Glucose, UA NEGATIVE NEGATIVE mg/dL   Hgb urine dipstick NEGATIVE NEGATIVE   Bilirubin Urine NEGATIVE NEGATIVE   Ketones, ur 5 (A) NEGATIVE mg/dL   Protein, ur NEGATIVE NEGATIVE mg/dL   Nitrite NEGATIVE NEGATIVE   Leukocytes,Ua NEGATIVE NEGATIVE   Imaging Studies: DG Chest 2 View  Result Date: 08/03/2020 CLINICAL DATA:  Shortness of breath EXAM: CHEST - 2 VIEW COMPARISON:  None. FINDINGS: The heart size and mediastinal contours are within normal limits. Both lungs are clear. The visualized skeletal structures are unremarkable. IMPRESSION: No active cardiopulmonary disease. Electronically Signed   By: Kathreen Devoid   On: 08/03/2020 21:00   CT ABDOMEN PELVIS W CONTRAST  Result Date: 08/04/2020 CLINICAL DATA:  Diverticulitis suspected. Abdominal pain. Nausea. White blood cell count 12.5 EXAM: CT ABDOMEN AND PELVIS WITH CONTRAST TECHNIQUE: Multidetector CT imaging of the abdomen and pelvis was performed using the standard protocol  following bolus administration of intravenous contrast. CONTRAST:  163m OMNIPAQUE IOHEXOL 300 MG/ML  SOLN COMPARISON:  CT abdomen pelvis 07/28/2020 FINDINGS: Lower chest: Trace subsegmental atelectasis of the right lower lobe. Hepatobiliary: No focal liver abnormality. No gallstones, gallbladder wall thickening, or pericholecystic fluid. No biliary dilatation. Pancreas: No focal lesion. Normal pancreatic contour. No surrounding inflammatory changes. No main pancreatic ductal dilatation. Spleen: Normal in size without focal abnormality. Adrenals/Urinary Tract: No adrenal nodule bilaterally. Bilateral kidneys enhance  symmetrically. Similar-appearing fluid density lesion within the right kidney that likely represents a simple renal cyst. No hydronephrosis. No hydroureter. The urinary bladder is unremarkable. Stomach/Bowel: Surgical changes related to and room air Roux-en-Y gastric bypass. Sigmoid resection. Stomach is within normal limits. No evidence of bowel wall thickening or dilatation. Stool throughout the majority of the colon. Scattered colonic diverticulosis. Appendix appears normal. Vascular/Lymphatic: No abdominal aorta or iliac aneurysm. Mild atherosclerotic plaque of the aorta and its branches. No abdominal, pelvic, or inguinal lymphadenopathy. Reproductive: Persistent 3.9 cm right ovarian cystic lesion. The lesion is noted to abut the sigmoid resection site. Otherwise uterus and bilateral adnexa are unremarkable. Other: No intraperitoneal free fluid. No intraperitoneal free gas. No organized fluid collection. Musculoskeletal: No abdominal wall hernia or abnormality. No suspicious lytic or blastic osseous lesions. No acute displaced fracture. Multilevel degenerative changes of the spine. Partially visualized total left hip arthroplasty. IMPRESSION: 1. Scattered colonic diverticulosis with no acute diverticulitis. 2. Constipation. 3. Persistent 3.9 cm right ovarian cystic lesion. If patient is postmenopausal: Recommend follow-up [facility] 6-12 months. Note: This recommendation does not apply to premenarchal patients and to those with increased risk (genetic, family history, elevated tumor markers or other high-risk factors) of ovarian cancer. Reference: JACR 2020 Feb; 17(2):248-254 Electronically Signed   By: MIven FinnM.D.   On: 08/04/2020 03:35    ED COURSE and MDM  Nursing notes, initial and subsequent vitals signs, including pulse oximetry, reviewed and interpreted by myself.  Vitals:   08/04/20 0130 08/04/20 0200 08/04/20 0300 08/04/20 0303  BP:  (!) 171/95 (!) 167/105   Pulse: 82 76 81 82  Resp:  18 17    Temp:      TempSrc:      SpO2: 96% 96% 95% 95%   Medications  sodium chloride (PF) 0.9 % injection (has no administration in time range)  ondansetron (ZOFRAN) injection 4 mg (4 mg Intravenous Given 08/04/20 0139)  fentaNYL (SUBLIMAZE) injection 100 mcg (100 mcg Intravenous Given 08/04/20 0139)  iohexol (OMNIPAQUE) 300 MG/ML solution 100 mL (100 mLs Intravenous Contrast Given 08/04/20 0224)   4:08 AM Patient advised of CT findings.  She was advised to take MiraLAX daily until her bowels are moving well.  She was advised of right ovarian cyst which appears stable.   PROCEDURES  Procedures   ED DIAGNOSES     ICD-10-CM   1. Left lower quadrant abdominal pain  R10.32     2. Constipation, unspecified constipation type  K59.00     3. Right ovarian cyst  N83.201          MShanon Rosser MD 08/04/20 [phone]

## 2020-08-05 DIAGNOSIS — M5416 Radiculopathy, lumbar region: Secondary | ICD-10-CM | POA: Insufficient documentation

## 2020-08-09 ENCOUNTER — Other Ambulatory Visit: Payer: Self-pay | Admitting: Family Medicine

## 2020-08-11 DIAGNOSIS — M25552 Pain in left hip: Secondary | ICD-10-CM | POA: Diagnosis not present

## 2020-08-16 ENCOUNTER — Other Ambulatory Visit: Payer: Self-pay | Admitting: Family Medicine

## 2020-08-16 DIAGNOSIS — M5416 Radiculopathy, lumbar region: Secondary | ICD-10-CM | POA: Diagnosis not present

## 2020-08-16 DIAGNOSIS — G43709 Chronic migraine without aura, not intractable, without status migrainosus: Secondary | ICD-10-CM

## 2020-08-22 DIAGNOSIS — F329 Major depressive disorder, single episode, unspecified: Secondary | ICD-10-CM | POA: Diagnosis not present

## 2020-08-22 DIAGNOSIS — F411 Generalized anxiety disorder: Secondary | ICD-10-CM | POA: Diagnosis not present

## 2020-08-22 DIAGNOSIS — F4312 Post-traumatic stress disorder, chronic: Secondary | ICD-10-CM | POA: Diagnosis not present

## 2020-08-22 DIAGNOSIS — F3131 Bipolar disorder, current episode depressed, mild: Secondary | ICD-10-CM | POA: Diagnosis not present

## 2020-08-22 DIAGNOSIS — F331 Major depressive disorder, recurrent, moderate: Secondary | ICD-10-CM | POA: Diagnosis not present

## 2020-08-26 ENCOUNTER — Other Ambulatory Visit: Payer: Self-pay | Admitting: Family Medicine

## 2020-08-30 NOTE — Progress Notes (Deleted)
  [facility] [address] [place] [address] Phone: [phone] Subjective:    I'm seeing this patient by the request  of:  Ardith Dark, MD  CC:   [id number]  Heidi Holland is a 45 y.o. female coming in with complaint of back and neck pain. OMT 07/21/2020. Patient states   Medications patient has been prescribed: Vit D  Taking:         Reviewed prior external information including notes and imaging from previsou exam, outside providers and external EMR if available.   As well as notes that were available from care everywhere and other healthcare systems.  Past medical history, social, surgical and family history all reviewed in electronic medical record.  No pertanent information unless stated regarding to the chief complaint.   Past Medical History:  Diagnosis Date   Allergy    Anxiety    Asthma    Depression    Diabetes mellitus without complication (H[facility])    Diverticulitis    GERD (gastroesophageal reflux disease)    HPV in female    Hyperlipidemia    Migraines     Allergies  Allergen Reactions   Ciprofloxacin Itching     Review of Systems:  No headache, visual changes, nausea, vomiting, diarrhea, constipation, dizziness, abdominal pain, skin rash, fevers, chills, night sweats, weight loss, swollen lymph nodes, body aches, joint swelling, chest pain, shortness of breath, mood changes. POSITIVE muscle aches  Objective  There were no vitals taken for this visit.   General: No apparent distress alert and oriented x3 mood and affect normal, dressed appropriately.  HEENT: Pupils equal, extraocular movements intact  Respiratory: Patient's speak in full sentences and does not appear short of breath  Cardiovascular: No lower extremity edema, non tender, no erythema  Neuro: Cranial nerves II through XII are intact, neurovascularly intact in all extremities with 2+ DTRs and 2+ pulses.  Gait normal with good balance and  coordination.  MSK:  Non tender with full range of motion and good stability and symmetric strength and tone of shoulders, elbows, wrist, hip, knee and ankles bilaterally.  Back - Normal skin, Spine with normal alignment and no deformity.  No tenderness to vertebral process palpation.  Paraspinous muscles are not tender and without spasm.   Range of motion is full at neck and lumbar sacral regions  Osteopathic findings  C2 flexed rotated and side bent right C6 flexed rotated and side bent left T3 extended rotated and side bent right inhaled rib T9 extended rotated and side bent left L2 flexed rotated and side bent right Sacrum right on right       Assessment and Plan:    Nonallopathic problems  Decision today to treat with OMT was based on Physical Exam  After verbal consent patient was treated with HVLA, ME, FPR techniques in cervical, rib, thoracic, lumbar, and sacral  areas  Patient tolerated the procedure well with improvement in symptoms  Patient given exercises, stretches and lifestyle modifications  See medications in patient instructions if given  Patient will follow up in 4-8 weeks      The above documentation has been reviewed and is accurate and complete Wilford Grist       Note: This dictation was prepared with Dragon dictation along with smaller phrase technology. Any transcriptional errors that result from this process are unintentional.

## 2020-09-01 ENCOUNTER — Ambulatory Visit: Payer: BC Managed Care – PPO | Admitting: Family Medicine

## 2020-09-05 ENCOUNTER — Other Ambulatory Visit: Payer: Self-pay | Admitting: Family Medicine

## 2020-09-05 DIAGNOSIS — F3112 Bipolar disorder, current episode manic without psychotic features, moderate: Secondary | ICD-10-CM | POA: Diagnosis not present

## 2020-09-05 DIAGNOSIS — F4312 Post-traumatic stress disorder, chronic: Secondary | ICD-10-CM | POA: Diagnosis not present

## 2020-09-05 DIAGNOSIS — F411 Generalized anxiety disorder: Secondary | ICD-10-CM | POA: Diagnosis not present

## 2020-09-06 DIAGNOSIS — F331 Major depressive disorder, recurrent, moderate: Secondary | ICD-10-CM | POA: Diagnosis not present

## 2020-09-06 DIAGNOSIS — F4312 Post-traumatic stress disorder, chronic: Secondary | ICD-10-CM | POA: Diagnosis not present

## 2020-09-06 DIAGNOSIS — F411 Generalized anxiety disorder: Secondary | ICD-10-CM | POA: Diagnosis not present

## 2020-09-06 DIAGNOSIS — F329 Major depressive disorder, single episode, unspecified: Secondary | ICD-10-CM | POA: Diagnosis not present

## 2020-09-12 ENCOUNTER — Other Ambulatory Visit: Payer: Self-pay | Admitting: Neurology

## 2020-09-13 ENCOUNTER — Other Ambulatory Visit: Payer: Self-pay | Admitting: Neurology

## 2020-09-27 ENCOUNTER — Ambulatory Visit: Payer: BC Managed Care – PPO | Admitting: Neurology

## 2020-09-29 ENCOUNTER — Ambulatory Visit: Payer: BC Managed Care – PPO | Admitting: Family Medicine

## 2020-09-29 ENCOUNTER — Other Ambulatory Visit: Payer: Self-pay

## 2020-09-29 MED ORDER — TRAMADOL HCL 50 MG PO TABS: 50.0000 mg | ORAL_TABLET | Freq: Three times a day (TID) | ORAL | 5 refills | Status: DC | PRN

## 2020-09-29 NOTE — Telephone Encounter (Signed)
Refill sent to Dr. Parker for approval. 

## 2020-09-29 NOTE — Telephone Encounter (Signed)
..   Encourage patient to contact the pharmacy for refills or they can request refills through [facility]  LAST APPOINTMENT DATE:  Please schedule appointment if longer than 1 year  NEXT APPOINTMENT DATE:  MEDICATION:traMADol (ULTRAM) 50 MG tablet  Is the patient out of medication?   PH[facility]ACY[id number] [place], [place] - [address]

## 2020-10-11 ENCOUNTER — Other Ambulatory Visit: Payer: Self-pay | Admitting: Family Medicine

## 2020-10-18 ENCOUNTER — Telehealth: Payer: Self-pay | Admitting: *Deleted

## 2020-10-18 NOTE — Telephone Encounter (Signed)
Called [facility] to set up delivery Aram Beecham said patient already gave consent and it is in Benefit verification at present. As soon as benefits are verified they will contact us to schedule delivery.

## 2020-10-22 ENCOUNTER — Other Ambulatory Visit: Payer: Self-pay | Admitting: Family Medicine

## 2020-10-22 DIAGNOSIS — G43709 Chronic migraine without aura, not intractable, without status migrainosus: Secondary | ICD-10-CM

## 2020-10-24 ENCOUNTER — Encounter: Payer: Self-pay | Admitting: Family Medicine

## 2020-10-24 ENCOUNTER — Telehealth: Payer: Self-pay

## 2020-10-24 ENCOUNTER — Other Ambulatory Visit: Payer: Self-pay | Admitting: Family Medicine

## 2020-10-24 ENCOUNTER — Telehealth (INDEPENDENT_AMBULATORY_CARE_PROVIDER_SITE_OTHER): Payer: BC Managed Care – PPO | Admitting: Family Medicine

## 2020-10-24 VITALS — Ht 62.0 in | Wt 211.0 lb

## 2020-10-24 DIAGNOSIS — J309 Allergic rhinitis, unspecified: Secondary | ICD-10-CM | POA: Diagnosis not present

## 2020-10-24 DIAGNOSIS — Z1283 Encounter for screening for malignant neoplasm of skin: Secondary | ICD-10-CM

## 2020-10-24 DIAGNOSIS — Z9884 Bariatric surgery status: Secondary | ICD-10-CM | POA: Diagnosis not present

## 2020-10-24 DIAGNOSIS — J329 Chronic sinusitis, unspecified: Secondary | ICD-10-CM

## 2020-10-24 DIAGNOSIS — Z8249 Family history of ischemic heart disease and other diseases of the circulatory system: Secondary | ICD-10-CM

## 2020-10-24 DIAGNOSIS — J4520 Mild intermittent asthma, uncomplicated: Secondary | ICD-10-CM

## 2020-10-24 DIAGNOSIS — R87618 Other abnormal cytological findings on specimens from cervix uteri: Secondary | ICD-10-CM | POA: Insufficient documentation

## 2020-10-24 DIAGNOSIS — E119 Type 2 diabetes mellitus without complications: Secondary | ICD-10-CM

## 2020-10-24 DIAGNOSIS — K219 Gastro-esophageal reflux disease without esophagitis: Secondary | ICD-10-CM

## 2020-10-24 DIAGNOSIS — R87619 Unspecified abnormal cytological findings in specimens from cervix uteri: Secondary | ICD-10-CM | POA: Insufficient documentation

## 2020-10-24 MED ORDER — DOXYCYCLINE HYCLATE 100 MG PO TABS: 100.0000 mg | ORAL_TABLET | Freq: Two times a day (BID) | ORAL | 0 refills | Status: AC

## 2020-10-24 MED ORDER — PREDNISONE 50 MG PO TABS: ORAL_TABLET | ORAL | 0 refills | Status: DC

## 2020-10-24 MED ORDER — AMOXICILLIN-POT CLAVULANATE 875-125 MG PO TABS: 1.0000 | ORAL_TABLET | Freq: Two times a day (BID) | ORAL | 0 refills | Status: DC

## 2020-10-24 NOTE — Telephone Encounter (Signed)
Rx sent to pharmacy   

## 2020-10-24 NOTE — Telephone Encounter (Signed)
Please send in doxycycline 100mg  bid x 7 days.

## 2020-10-24 NOTE — Telephone Encounter (Signed)
Patient states she spoke to pharmacy and the antibiotic we prescribed her is on back order and has been for about 3 weeks and is at almost all [facility] patient requested we change medication to a different antibiotic

## 2020-10-24 NOTE — Telephone Encounter (Signed)
This antibiotic is on back order everywhere. Alternative needed.

## 2020-10-24 NOTE — Addendum Note (Signed)
Addended byZoe Lan on: 10/24/2020 02:07 PM   Modules accepted: Orders

## 2020-10-24 NOTE — Assessment & Plan Note (Signed)
Last A1c at goal though we need to recheck this soon.  Will place referral to ophthalmology for diabetic eye exam.

## 2020-10-24 NOTE — Assessment & Plan Note (Signed)
Continue Protonix 40 mg daily.  Also has history of gastric bypass.  She is additionally due for colon cancer screening-we will place referral to GI to discuss.

## 2020-10-24 NOTE — Assessment & Plan Note (Signed)
Patient with several risk factors including diabetes and dyslipidemia.  Will place referral to cardiology per patient request.

## 2020-10-24 NOTE — Assessment & Plan Note (Signed)
Reasonably well-controlled on albuterol.  She does have concurrent history of obstructive sleep apnea as well.  We will place referral to pulmonology per patient request.

## 2020-10-24 NOTE — Progress Notes (Signed)
   Heidi Holland is a 45 y.o. female who presents today for a virtual office visit.  Assessment/Plan:  New/Acute Problems: Sinusitis No red flags.  She is already using Astelin nasal spray.  We will additionally start Augmentin and prednisone.  She will let me know if not improving.  Chronic Problems Addressed Today: Family history of early CAD Patient with several risk factors including diabetes and dyslipidemia.  Will place referral to cardiology per patient request.  Pap smear abnormality of cervix/human papillomavirus (HPV) positive Needs updated Pap smear.  Will place referral to gynecology.  Last Pap 2 years ago positive for HPV.  GERD (gastroesophageal reflux disease) Continue Protonix 40 mg daily.  Also has history of gastric bypass.  She is additionally due for colon cancer screening-we will place referral to GI to discuss.  Mild intermittent asthma, uncomplicated Reasonably well-controlled on albuterol.  She does have concurrent history of obstructive sleep apnea as well.  We will place referral to pulmonology per patient request.  T2DM (type 2 diabetes mellitus) (HCC) Last A1c at goal though we need to recheck this soon.  Will place referral to ophthalmology for diabetic eye exam.     Subjective:  HPI:  See A/P for status of chronic conditions.  Patient has a few referrals that she is requesting to have placed today.  She is also been going with a sinus infection for the last couple of weeks.  Has tried Astelin nasal spray and over-the-counter meds with no improvement.  Has a lot of drainage and congestion.  Feels similar to prior sinus infections.       Objective/Observations  Physical Exam: Gen: NAD, resting comfortably Pulm: Normal work of breathing Neuro: Grossly normal, moves all extremities Psych: Normal affect and thought content  Virtual Visit via Video   I connected with Heidi Holland on 10/24/20 at  3:40 PM EDT by a video enabled telemedicine application  and verified that I am speaking with the correct person using two identifiers. The limitations of evaluation and management by telemedicine and the availability of in person appointments were discussed. The patient expressed understanding and agreed to proceed.   Patient location: Home Provider location: [place] [employer] Persons participating in the virtual visit: Myself and Patient     [name]. [name], MD 10/24/2020 12:05 PM

## 2020-10-24 NOTE — Assessment & Plan Note (Signed)
Needs updated Pap smear.  Will place referral to gynecology.  Last Pap 2 years ago positive for HPV.

## 2020-10-25 MED ORDER — DOXYCYCLINE HYCLATE 100 MG PO TABS: 100.0000 mg | ORAL_TABLET | Freq: Two times a day (BID) | ORAL | 0 refills | Status: AC

## 2020-10-25 NOTE — Telephone Encounter (Signed)
See below

## 2020-10-25 NOTE — Telephone Encounter (Signed)
Rx sent 

## 2020-10-25 NOTE — Telephone Encounter (Signed)
Please see rx note. Ok to send in doxycycline 100mg  bid x 7 days.  . [name], MD 10/25/2020 8:43 AM

## 2020-10-27 NOTE — Telephone Encounter (Signed)
Telephone call to pt, Advised Botox not received.   Pt advised she was contacted by the Pharmacy, They advised her that they needed the office to contact them. And they have left [place] a message. And they will be able to overnight the Botox.   Advised pt I will give them a call now.   Permission given for delivery to be overnight. Should be delivered by 12 noon 10/27/20.

## 2020-10-28 ENCOUNTER — Other Ambulatory Visit: Payer: Self-pay

## 2020-10-28 ENCOUNTER — Ambulatory Visit: Payer: BC Managed Care – PPO | Admitting: Neurology

## 2020-10-28 NOTE — Telephone Encounter (Signed)
Spoke with Heidi Holland at [facility], Pt has a past due balance tjat needs to be address before they can send the Botox. So Botox was put on hold. If patient gets this resolved the earliest the botox can be delivered is Tuesday 11/01/20.   Pt advised. Pt will Contact [facility].

## 2020-10-28 NOTE — Telephone Encounter (Signed)
Spoke to Heidi Holland at [facility] pt Botox to be Delivered 11/01/20.

## 2020-10-31 ENCOUNTER — Telehealth: Payer: Self-pay | Admitting: Neurology

## 2020-10-31 DIAGNOSIS — G43709 Chronic migraine without aura, not intractable, without status migrainosus: Secondary | ICD-10-CM | POA: Diagnosis not present

## 2020-10-31 NOTE — Telephone Encounter (Signed)
Pt called in, stated she cant make it on the 21st for botox due to her job. Her days off are the 22nd, 28th and 30th. Can she be worked in on one of these days?

## 2020-11-02 ENCOUNTER — Ambulatory Visit: Payer: BC Managed Care – PPO | Admitting: Neurology

## 2020-11-03 ENCOUNTER — Ambulatory Visit (INDEPENDENT_AMBULATORY_CARE_PROVIDER_SITE_OTHER): Payer: BC Managed Care – PPO | Admitting: Neurology

## 2020-11-03 ENCOUNTER — Other Ambulatory Visit: Payer: Self-pay

## 2020-11-03 DIAGNOSIS — F411 Generalized anxiety disorder: Secondary | ICD-10-CM | POA: Diagnosis not present

## 2020-11-03 DIAGNOSIS — G43709 Chronic migraine without aura, not intractable, without status migrainosus: Secondary | ICD-10-CM

## 2020-11-03 DIAGNOSIS — F3112 Bipolar disorder, current episode manic without psychotic features, moderate: Secondary | ICD-10-CM | POA: Diagnosis not present

## 2020-11-03 DIAGNOSIS — F4312 Post-traumatic stress disorder, chronic: Secondary | ICD-10-CM | POA: Diagnosis not present

## 2020-11-03 MED ORDER — ONABOTULINUMTOXINA 100 UNITS IJ SOLR
200.0000 [IU] | Freq: Once | INTRAMUSCULAR | Status: AC
  Administered 2020-11-03: 155 [IU] via INTRAMUSCULAR

## 2020-11-03 NOTE — Progress Notes (Signed)
[facility]  ° °Procedure Note Botox ° °Attending: Dr. Kahlyn Shippey ° °Preoperative Diagnosis(es): Chronic migraine ° °Consent obtained from: The patient °Benefits discussed included, but were not limited to decreased muscle tightness, increased joint range of motion, and decreased pain.  Risk discussed included, but were not limited pain and discomfort, bleeding, bruising, excessive weakness, venous thrombosis, muscle atrophy and dysphagia.  Anticipated outcomes of the procedure as well as he risks and benefits of the alternatives to the procedure, and the roles and tasks of the personnel to be involved, were discussed with the patient, and the patient consents to the procedure and agrees to proceed. A copy of the patient medication guide was given to the patient which explains the blackbox warning. ° °Patients identity and treatment sites confirmed Yes.  . ° °Details of Procedure: °Skin was cleaned with alcohol. Prior to injection, the needle plunger was aspirated to make sure the needle was not within a blood vessel.  There was no blood retrieved on aspiration.   ° °Following is a summary of the muscles injected  And the amount of Botulinum toxin used: ° °Dilution °200 units of Botox was reconstituted with 4 ml of preservative free normal saline. °Time of reconstitution: At the time of the office visit (<30 minutes prior to injection)  ° °Injections  °155 total units of Botox was injected with a 30 gauge needle. ° °Injection Sites: °L occipitalis: 15 units- 3 sites  °R occiptalis: 15 units- 3 sites ° °L upper trapezius: 15 units- 3 sites °R upper trapezius: 15 units- 3 sits          °L paraspinal: 10 units- 2 sites °R paraspinal: 10 units- 2 sites ° °Face °L frontalis(2 injection sites):10 units   °R frontalis(2 injection sites):10 units         °L corrugator: 5 units   °R corrugator: 5 units           °Procerus: 5 units   °L temporalis: 20 units °R temporalis: 20 units  ° °Agent:  °200 units of botulinum Type  A (Onobotulinum Toxin type A) was reconstituted with 4 ml of preservative free normal saline.  °Time of reconstitution: At the time of the office visit (<30 minutes prior to injection)  ° ° ° Total injected (Units):  155 ° Total wasted (Units):  45 ° °Patient tolerated procedure well without complications.   °Reinjection is anticipated in 3 months. ° ° °

## 2020-11-12 ENCOUNTER — Other Ambulatory Visit: Payer: Self-pay | Admitting: Family Medicine

## 2020-11-14 DIAGNOSIS — M5416 Radiculopathy, lumbar region: Secondary | ICD-10-CM | POA: Diagnosis not present

## 2020-11-25 ENCOUNTER — Other Ambulatory Visit: Payer: Self-pay | Admitting: Family Medicine

## 2020-11-25 DIAGNOSIS — Z1231 Encounter for screening mammogram for malignant neoplasm of breast: Secondary | ICD-10-CM

## 2020-11-25 DIAGNOSIS — F4312 Post-traumatic stress disorder, chronic: Secondary | ICD-10-CM | POA: Diagnosis not present

## 2020-11-25 DIAGNOSIS — F3112 Bipolar disorder, current episode manic without psychotic features, moderate: Secondary | ICD-10-CM | POA: Diagnosis not present

## 2020-11-25 DIAGNOSIS — F411 Generalized anxiety disorder: Secondary | ICD-10-CM | POA: Diagnosis not present

## 2020-11-29 ENCOUNTER — Other Ambulatory Visit: Payer: Self-pay | Admitting: Neurology

## 2020-12-04 IMAGING — DX DG HIP (WITH OR WITHOUT PELVIS) 2-3V LEFT
3 series · 3 of 3 positions shown · non-contrast
Comparison: No recent.

CLINICAL DATA: Left hip pain.

EXAM:
DG HIP (WITH OR WITHOUT PELVIS) 2-3V LEFT

[pelvis ap]
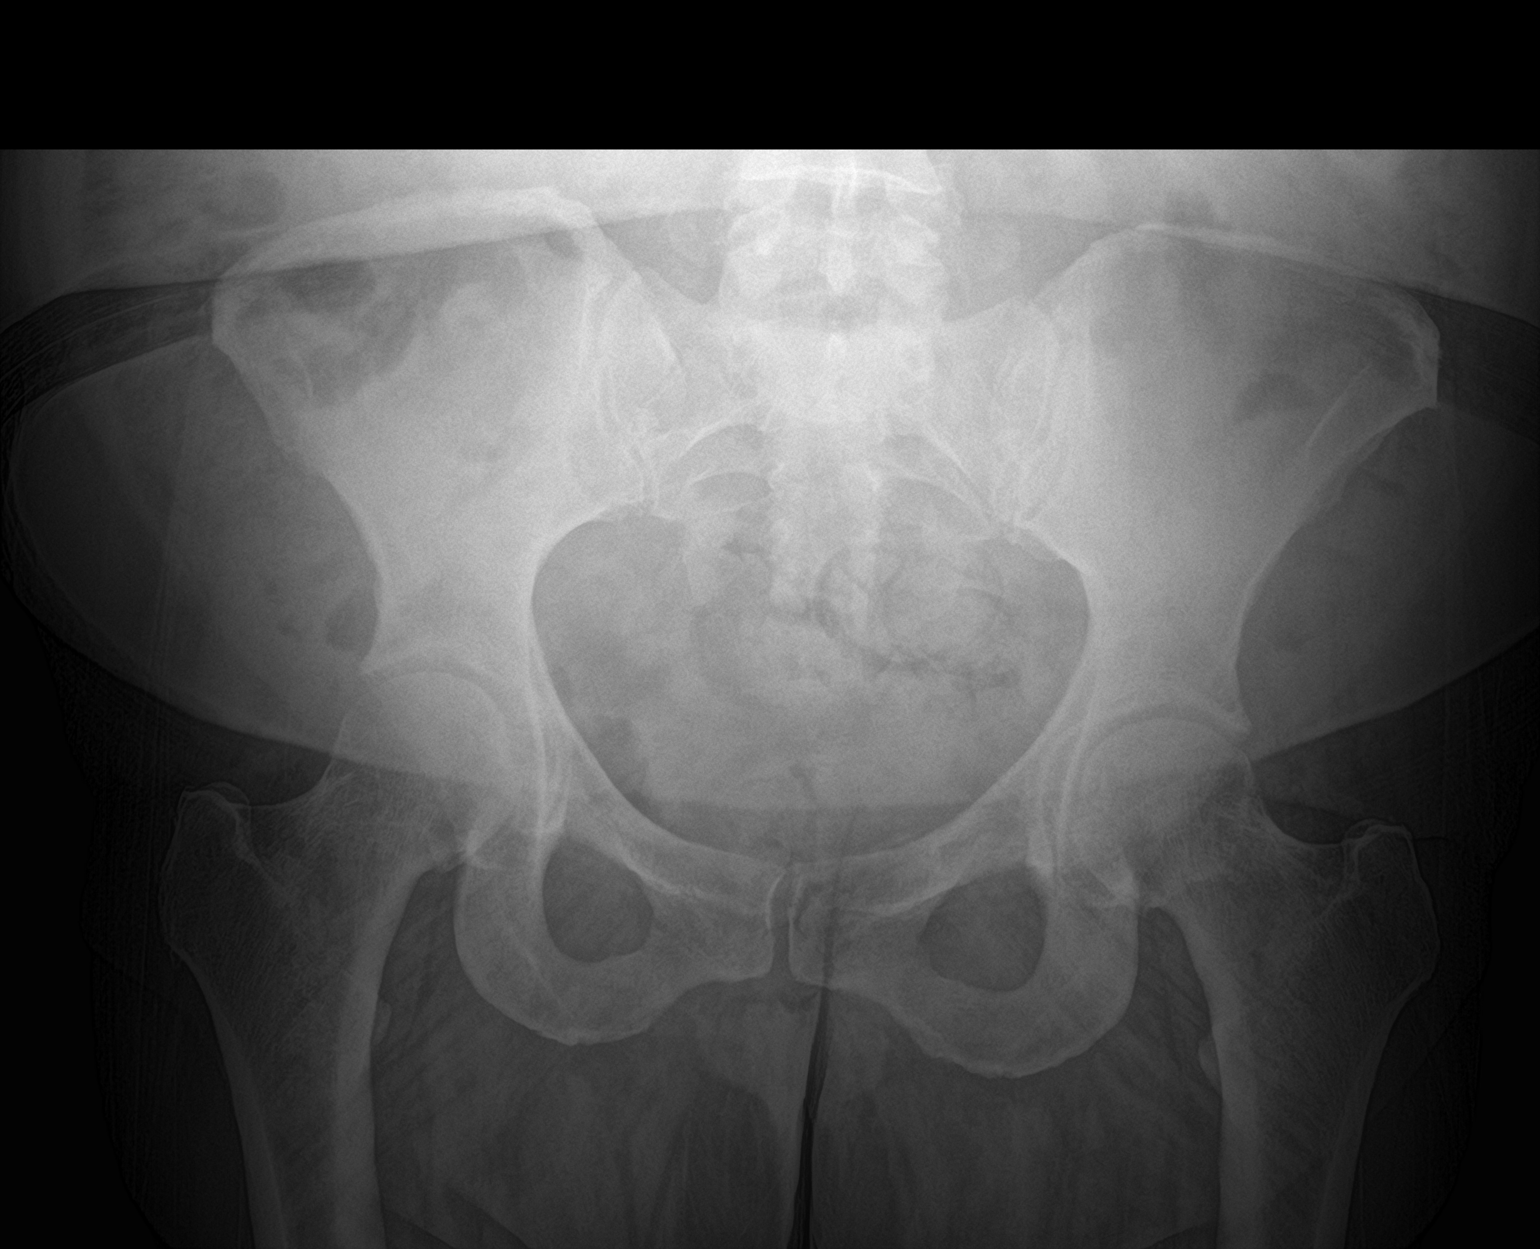

[hip ap]
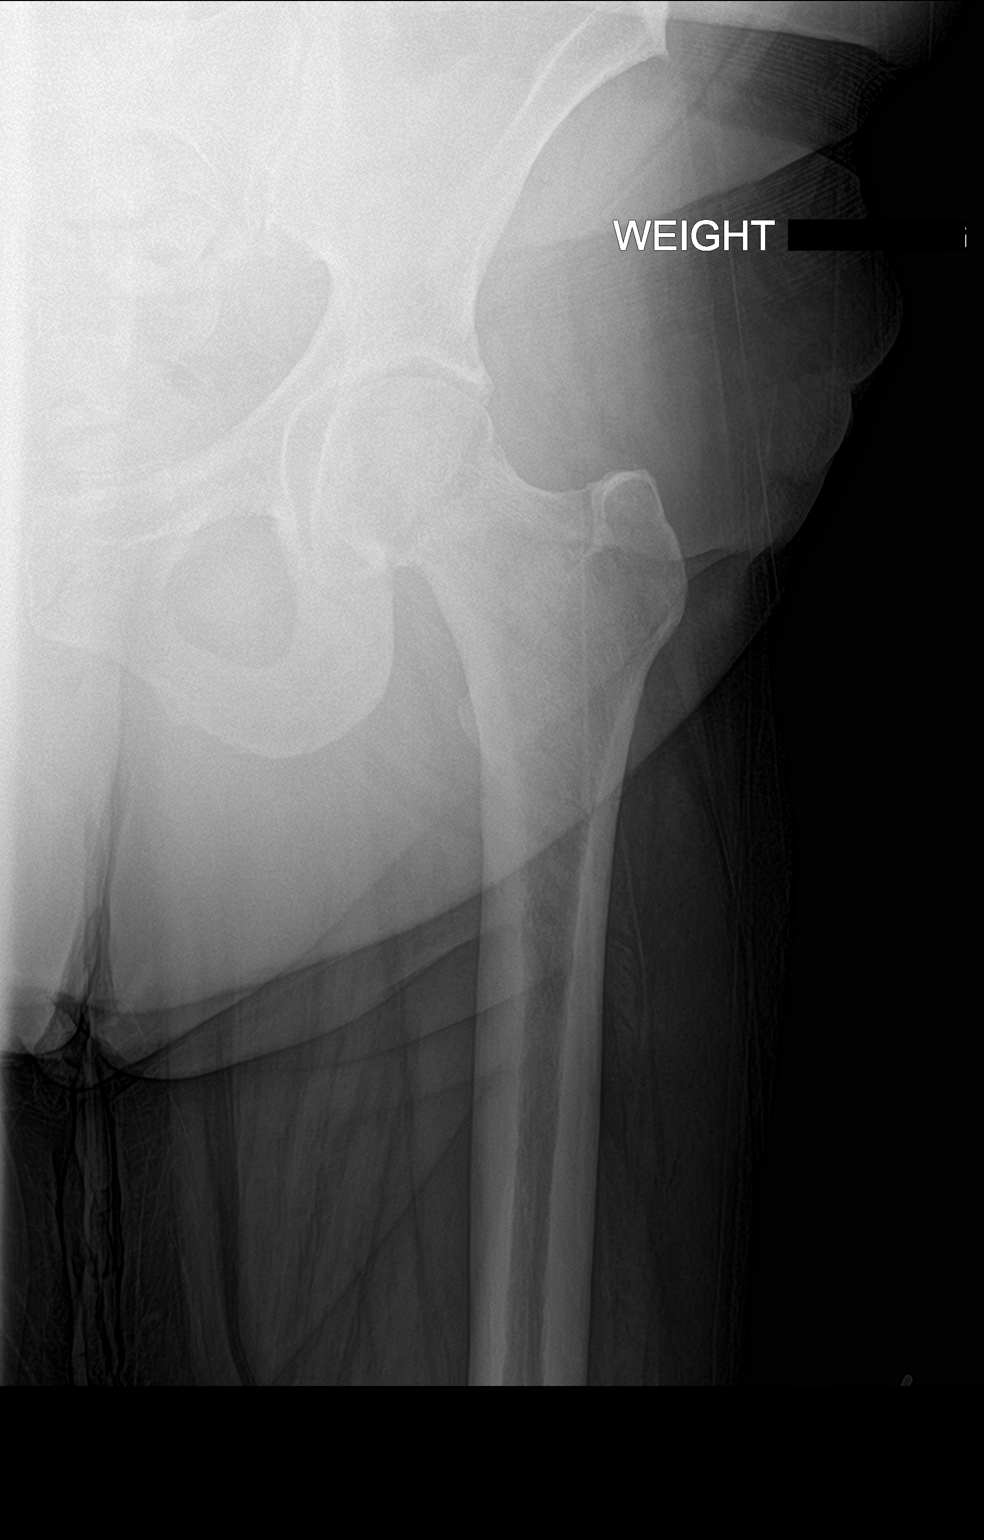

[hip lat]
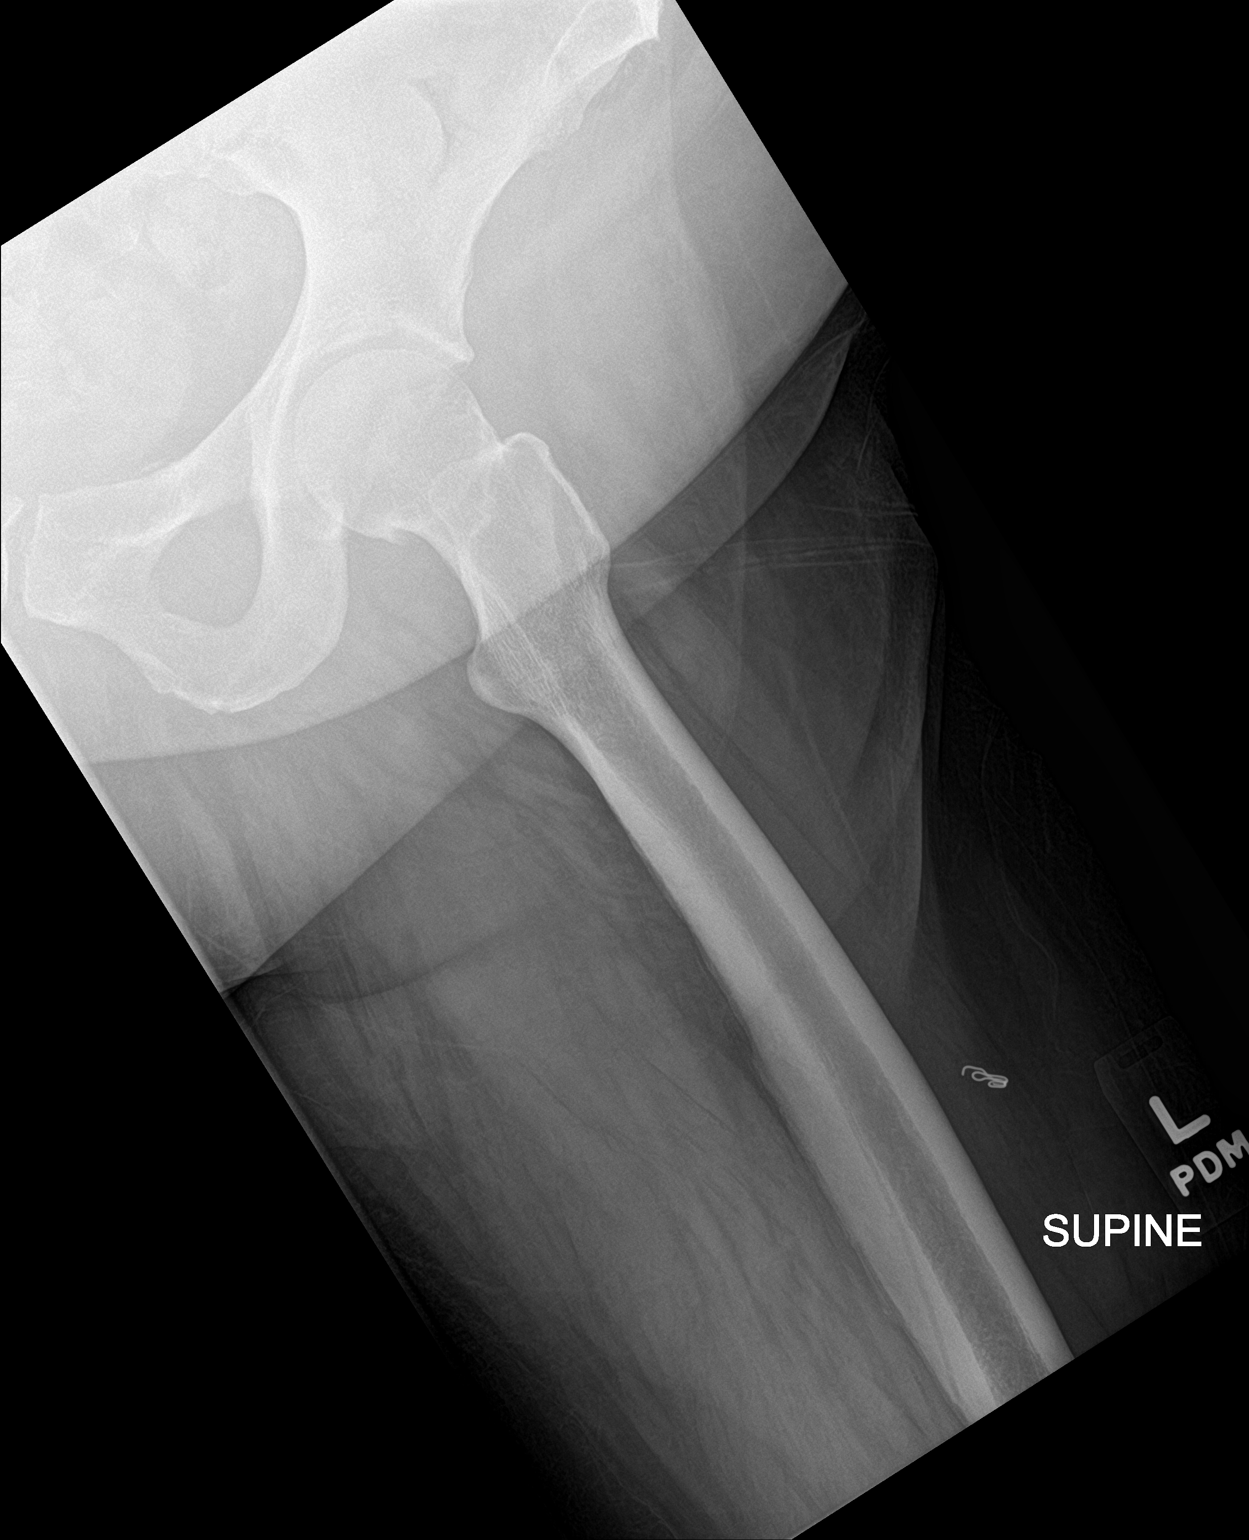

[3 of 3 positions shown; findings below may reference images not displayed]

FINDINGS: No mild degenerative change both hips. Bony mineralization normal.
Alignment normal. No definite acute abnormality identified. If
symptoms persist MRI can be obtained for further evaluation.
IMPRESSION: Mild degenerative changes both hips. No definite acute abnormality.
If symptoms persist MRI can be obtained for further evaluation.

## 2020-12-08 ENCOUNTER — Telehealth: Payer: Self-pay | Admitting: Neurology

## 2020-12-08 NOTE — Telephone Encounter (Signed)
Tosymra refill needed 10 MG spray.  [facility] [address]

## 2020-12-08 NOTE — Telephone Encounter (Signed)
Per chart refilled medication on 12/01/20.

## 2020-12-15 DIAGNOSIS — F411 Generalized anxiety disorder: Secondary | ICD-10-CM | POA: Diagnosis not present

## 2020-12-15 DIAGNOSIS — F329 Major depressive disorder, single episode, unspecified: Secondary | ICD-10-CM | POA: Diagnosis not present

## 2020-12-15 DIAGNOSIS — F3112 Bipolar disorder, current episode manic without psychotic features, moderate: Secondary | ICD-10-CM | POA: Diagnosis not present

## 2020-12-15 DIAGNOSIS — F4312 Post-traumatic stress disorder, chronic: Secondary | ICD-10-CM | POA: Diagnosis not present

## 2020-12-15 DIAGNOSIS — F331 Major depressive disorder, recurrent, moderate: Secondary | ICD-10-CM | POA: Diagnosis not present

## 2020-12-18 ENCOUNTER — Other Ambulatory Visit: Payer: Self-pay | Admitting: Family Medicine

## 2020-12-19 ENCOUNTER — Other Ambulatory Visit: Payer: Self-pay | Admitting: *Deleted

## 2020-12-19 MED ORDER — PREGABALIN 150 MG PO CAPS: 150.0000 mg | ORAL_CAPSULE | Freq: Two times a day (BID) | ORAL | 0 refills | Status: DC

## 2020-12-20 ENCOUNTER — Other Ambulatory Visit: Payer: Self-pay | Admitting: Family Medicine

## 2020-12-22 ENCOUNTER — Ambulatory Visit: Payer: BC Managed Care – PPO

## 2020-12-23 ENCOUNTER — Telehealth: Payer: BC Managed Care – PPO | Admitting: Physician Assistant

## 2020-12-23 ENCOUNTER — Ambulatory Visit: Payer: BC Managed Care – PPO | Admitting: Cardiology

## 2020-12-23 DIAGNOSIS — J019 Acute sinusitis, unspecified: Secondary | ICD-10-CM

## 2020-12-23 DIAGNOSIS — B9689 Other specified bacterial agents as the cause of diseases classified elsewhere: Secondary | ICD-10-CM

## 2020-12-23 MED ORDER — PREDNISONE 10 MG (21) PO TBPK: ORAL_TABLET | ORAL | 0 refills | Status: DC

## 2020-12-23 MED ORDER — BENZONATATE 100 MG PO CAPS
100.0000 mg | ORAL_CAPSULE | Freq: Three times a day (TID) | ORAL | 0 refills | Status: DC | PRN
Start: 2020-12-23 — End: 2021-03-01

## 2020-12-23 MED ORDER — AMOXICILLIN-POT CLAVULANATE 875-125 MG PO TABS: 1.0000 | ORAL_TABLET | Freq: Two times a day (BID) | ORAL | 0 refills | Status: DC

## 2020-12-23 NOTE — Patient Instructions (Signed)
[name], thank you for joining Mar Daring, PA-C for today's virtual visit.  While this provider is not your primary care provider (PCP), if your PCP is located in our provider database this encounter information will be shared with them immediately following your visit.  Consent: (Patient) Heidi Holland provided verbal consent for this virtual visit at the beginning of the encounter.  Current Medications:  Current Outpatient Medications:    amoxicillin-clavulanate (AUGMENTIN) 875-125 MG tablet, Take 1 tablet by mouth 2 (two) times daily., Disp: 20 tablet, Rfl: 0   benzonatate (TESSALON) 100 MG capsule, Take 1 capsule (100 mg total) by mouth 3 (three) times daily as needed., Disp: 30 capsule, Rfl: 0   predniSONE (STERAPRED UNI-PAK 21 TAB) 10 MG (21) TBPK tablet, 6 day taper; take as directed on package instructions, Disp: 21 tablet, Rfl: 0   acetaminophen (TYLENOL) 325 MG tablet, Take 650 mg by mouth every 6 (six) hours as needed for mild pain., Disp: , Rfl:    albuterol (VENTOLIN HFA) 108 (90 Base) MCG/ACT inhaler, INHALE 2 PUFFS BY MOUTH EVERY 6 HOURS AS NEEDED FOR WHEEZE OR SHORTNESS OF BREATH, Disp: 8.5 each, Rfl: 2   azelastine (ASTELIN) 0.1 % nasal spray, Place 2 sprays into both nostrils 2 (two) times daily. Use in each nostril as directed, Disp: 30 mL, Rfl: 5   blood glucose meter kit and supplies KIT, Dispense based on patient and insurance preference. Use up to four times daily as directed. (FOR ICD-9 250.00, 250.01)., Disp: 1 each, Rfl: 0   buPROPion (WELLBUTRIN SR) 150 MG 12 hr tablet, Take 150 mg by mouth daily., Disp: , Rfl:    clonazePAM (KLONOPIN) 0.5 MG tablet, TAKE 1 TABLET BY MOUTH TWICE A DAY AS NEEDED FOR ANXIETY, Disp: 60 tablet, Rfl: 5   Continuous Blood Gluc Receiver (FREESTYLE LIBRE READER) DEVI, 1 Device by Does not apply route daily as needed (to check blood sugar)., Disp: 1 Device, Rfl: 0   Continuous Blood Gluc Sensor (FREESTYLE LIBRE SENSOR SYSTEM) MISC,  Apply 1 sensor to skin every 14 days., Disp: 2 each, Rfl: 11   lamoTRIgine (LAMICTAL) 200 MG tablet, Take 200 mg by mouth. , Disp: , Rfl:    metFORMIN (GLUCOPHAGE) 1000 MG tablet, TAKE 1 TABLET BY MOUTH TWICE A DAY WITH A MEAL, Disp: 180 tablet, Rfl: 3   ondansetron (ZOFRAN-ODT) 4 MG disintegrating tablet, DISSOLVE 1 TABLET ON TONGUE EVERY 8 HOURS AS NEEDED FOR NAUSEA AND VOMITING, Disp: 9 tablet, Rfl: 5   pantoprazole (PROTONIX) 40 MG tablet, TAKE 1 TABLET BY MOUTH EVERY DAY IN THE EVENING, Disp: 90 tablet, Rfl: 0   pregabalin (LYRICA) 150 MG capsule, Take 1 capsule (150 mg total) by mouth 2 (two) times daily., Disp: 60 capsule, Rfl: 0   sodium chloride (OCEAN) 0.65 % SOLN nasal spray, Place 1 spray into both nostrils as needed for congestion., Disp: 44 mL, Rfl: 0   tiZANidine (ZANAFLEX) 4 MG tablet, TAKE ONE TABLET BY MOUTH THREE TIMES DAILY, Disp: 90 tablet, Rfl: 0   TOSYMRA 10 MG/ACT SOLN, PLACE 1 SPRAY INTO THE NOSE EVERY HOUR AS NEEDED (MAXIMUM 3 SPRAYS IN 24 HOURS), Disp: 6 each, Rfl: 2   traMADol (ULTRAM) 50 MG tablet, Take 1 tablet (50 mg total) by mouth every 8 (eight) hours as needed., Disp: 90 tablet, Rfl: 5   Vitamin D, Ergocalciferol, (DRISDOL) 1.25 MG (50000 UNIT) CAPS capsule, Take 1 capsule (50,000 Units total) by mouth every 7 (seven) days., Disp: 12 capsule, Rfl:  0   zolpidem (AMBIEN) 5 MG tablet, Take 1 tablet (5 mg total) by mouth at bedtime as needed for sleep., Disp: 30 tablet, Rfl: 5   Medications ordered in this encounter:  Meds ordered this encounter  Medications   amoxicillin-clavulanate (AUGMENTIN) 875-125 MG tablet    Sig: Take 1 tablet by mouth 2 (two) times daily.    Dispense:  20 tablet    Refill:  0    Order Specific Question:   Supervising Provider    Answer:   MILLER, BRIAN [[id number]]   benzonatate (TESSALON) 100 MG capsule    Sig: Take 1 capsule (100 mg total) by mouth 3 (three) times daily as needed.    Dispense:  30 capsule    Refill:  0    Order Specific  Question:   Supervising Provider    Answer:   MILLER, BRIAN [[id number]]   predniSONE (STERAPRED UNI-PAK 21 TAB) 10 MG (21) TBPK tablet    Sig: 6 day taper; take as directed on package instructions    Dispense:  21 tablet    Refill:  0    Order Specific Question:   Supervising Provider    Answer:   Sabra Heck, [place]     *If you need refills on other medications prior to your next appointment, please contact your pharmacy*  Follow-Up: Call back or seek an in-person evaluation if the symptoms worsen or if the condition fails to improve as anticipated.  Other Instructions Sinusitis, Adult Sinusitis is inflammation of your sinuses. Sinuses are hollow spaces in the bones around your face. Your sinuses are located: Around your eyes. In the middle of your forehead. Behind your nose. In your cheekbones. Mucus normally drains out of your sinuses. When your nasal tissues become inflamed or swollen, mucus can become trapped or blocked. This allows bacteria, viruses, and fungi to grow, which leads to infection. Most infections of the sinuses are caused by a virus. Sinusitis can develop quickly. It can last for up to 4 weeks (acute) or for more than 12 weeks (chronic). Sinusitis often develops after a cold. What are the causes? This condition is caused by anything that creates swelling in the sinuses or stops mucus from draining. This includes: Allergies. Asthma. Infection from bacteria or viruses. Deformities or blockages in your nose or sinuses. Abnormal growths in the nose (nasal polyps). Pollutants, such as chemicals or irritants in the air. Infection from fungi (rare). What increases the risk? You are more likely to develop this condition if you: Have a weak body defense system (immune system). Do a lot of swimming or diving. Overuse nasal sprays. Smoke. What are the signs or symptoms? The main symptoms of this condition are pain and a feeling of pressure around the affected sinuses.  Other symptoms include: Stuffy nose or congestion. Thick drainage from your nose. Swelling and warmth over the affected sinuses. Headache. Upper toothache. A cough that may get worse at night. Extra mucus that collects in the throat or the back of the nose (postnasal drip). Decreased sense of smell and taste. Fatigue. A fever. Sore throat. Bad breath. How is this diagnosed? This condition is diagnosed based on: Your symptoms. Your medical history. A physical exam. Tests to find out if your condition is acute or chronic. This may include: Checking your nose for nasal polyps. Viewing your sinuses using a device that has a light (endoscope). Testing for allergies or bacteria. Imaging tests, such as an MRI or CT scan. In rare cases, a bone  biopsy may be done to rule out more serious types of fungal sinus disease. How is this treated? Treatment for sinusitis depends on the cause and whether your condition is chronic or acute. If caused by a virus, your symptoms should go away on their own within 10 days. You may be given medicines to relieve symptoms. They include: Medicines that shrink swollen nasal passages (topical intranasal decongestants). Medicines that treat allergies (antihistamines). A spray that eases inflammation of the nostrils (topical intranasal corticosteroids). Rinses that help get rid of thick mucus in your nose (nasal saline washes). If caused by bacteria, your health care provider may recommend waiting to see if your symptoms improve. Most bacterial infections will get better without antibiotic medicine. You may be given antibiotics if you have: A severe infection. A weak immune system. If caused by narrow nasal passages or nasal polyps, you may need to have surgery. Follow these instructions at home: Medicines Take, use, or apply over-the-counter and prescription medicines only as told by your health care provider. These may include nasal sprays. If you were  prescribed an antibiotic medicine, take it as told by your health care provider. Do not stop taking the antibiotic even if you start to feel better. Hydrate and humidify  Drink enough fluid to keep your urine pale yellow. Staying hydrated will help to thin your mucus. Use a cool mist humidifier to keep the humidity level in your home above 50%. Inhale steam for 10-15 minutes, 3-4 times a day, or as told by your health care provider. You can do this in the bathroom while a hot shower is running. Limit your exposure to cool or dry air. Rest Rest as much as possible. Sleep with your head raised (elevated). Make sure you get enough sleep each night. General instructions  Apply a warm, moist washcloth to your face 3-4 times a day or as told by your health care provider. This will help with discomfort. Wash your hands often with soap and water to reduce your exposure to germs. If soap and water are not available, use hand sanitizer. Do not smoke. Avoid being around people who are smoking (secondhand smoke). Keep all follow-up visits as told by your health care provider. This is important. Contact a health care provider if: You have a fever. Your symptoms get worse. Your symptoms do not improve within 10 days. Get help right away if: You have a severe headache. You have persistent vomiting. You have severe pain or swelling around your face or eyes. You have vision problems. You develop confusion. Your neck is stiff. You have trouble breathing. Summary Sinusitis is soreness and inflammation of your sinuses. Sinuses are hollow spaces in the bones around your face. This condition is caused by nasal tissues that become inflamed or swollen. The swelling traps or blocks the flow of mucus. This allows bacteria, viruses, and fungi to grow, which leads to infection. If you were prescribed an antibiotic medicine, take it as told by your health care provider. Do not stop taking the antibiotic even if  you start to feel better. Keep all follow-up visits as told by your health care provider. This is important. This information is not intended to replace advice given to you by your health care provider. Make sure you discuss any questions you have with your health care provider. Document Revised: 07/01/2017 Document Reviewed: 07/01/2017 Elsevier Patient Education  2022 [employer].    If you have been instructed to have an in-person evaluation today at a local  Urgent Care facility, please use the link below. It will take you to a list of all of our available [facility], including address, phone number and hours of operation. Please do not delay care.  [facility]  If you or a family member do not have a primary care provider, use the link below to schedule a visit and establish care. When you choose a [facility] primary care physician or advanced practice provider, you gain a long-term partner in health. Find a Primary Care Provider  Learn more about [facility]'s in-office and virtual care options: [facility]

## 2020-12-23 NOTE — Progress Notes (Signed)
Virtual Visit Consent   [name], you are scheduled for a virtual visit with a [facility] provider today.     Just as with appointments in the office, your consent must be obtained to participate.  Your consent will be active for this visit and any virtual visit you may have with one of our providers in the next 365 days.     If you have a MyChart account, a copy of this consent can be sent to you electronically.  All virtual visits are billed to your insurance company just like a traditional visit in the office.    As this is a virtual visit, video technology does not allow for your provider to perform a traditional examination.  This may limit your provider's ability to fully assess your condition.  If your provider identifies any concerns that need to be evaluated in person or the need to arrange testing (such as labs, EKG, etc.), we will make arrangements to do so.     Although advances in technology are sophisticated, we cannot ensure that it will always work on either your end or our end.  If the connection with a video visit is poor, the visit may have to be switched to a telephone visit.  With either a video or telephone visit, we are not always able to ensure that we have a secure connection.     I need to obtain your verbal consent now.   Are you willing to proceed with your visit today?    Heidi Holland has provided verbal consent on 12/23/2020 for a virtual visit (video or telephone).   Mar Daring, PA-C   Date: 12/23/2020 1:58 PM   Virtual Visit via Video Note   I, Mar Daring, connected with  Heidi Holland  ([id number], Nov 15, 1975) on 12/23/20 at  2:00 PM EST by a video-enabled telemedicine application and verified that I am speaking with the correct person using two identifiers.  Location: Patient: Virtual Visit Location Patient: Home Provider: Virtual Visit Location Provider: Home Office   I discussed the limitations of evaluation and management by  telemedicine and the availability of in person appointments. The patient expressed understanding and agreed to proceed.    History of Present Illness: Heidi Holland is a 45 y.o. who identifies as a female who was assigned female at birth, and is being seen today for possible sinus infection.  HPI: Sinusitis This is a new problem. The current episode started 1 to 4 weeks ago (10 - 14 days). The problem has been gradually worsening since onset. There has been no fever. Associated symptoms include chills, coughing, ear pain (pressure; R>L), headaches, sinus pressure and a sore throat. (Hot and cold flashes, post nasal drainage, body aches, fatigue) Past treatments include sitting up and lying down (nyquil, sudafed, advil). The treatment provided no relief.   Problems:  Patient Active Problem List   Diagnosis Date Noted   Pap smear abnormality of cervix/human papillomavirus (HPV) positive 10/24/2020   Family history of early CAD 10/24/2020   B12 deficiency 07/26/2020   Somatic dysfunction of spine, cervical 07/21/2020   Status post left hip replacement 03/12/2019   T2DM (type 2 diabetes mellitus) ([place]) 07/31/2018   Dyslipidemia associated with type 2 diabetes mellitus ([place]) 07/31/2018   Allergic rhinitis 07/31/2018   Mild intermittent asthma, uncomplicated 17/49/4496   GERD (gastroesophageal reflux disease) 07/31/2018   S/P gastric bypass 07/31/2018   Migraine 07/31/2018   Anxiety 07/31/2018   Depression, major, single episode, complete  remission ([place]) 07/31/2018   Insomnia 11/65/7903   Umbilical hernia 83/33/8329   Iron deficiency anemia 06/09/2018   Cervicogenic headache 05/12/2018   Low back pain 05/12/2018   Polyarthralgia 05/12/2018    Allergies:  Allergies  Allergen Reactions   Ciprofloxacin Itching   Medications:  Current Outpatient Medications:    acetaminophen (TYLENOL) 325 MG tablet, Take 650 mg by mouth every 6 (six) hours as needed for mild pain., Disp: , Rfl:     albuterol (VENTOLIN HFA) 108 (90 Base) MCG/ACT inhaler, INHALE 2 PUFFS BY MOUTH EVERY 6 HOURS AS NEEDED FOR WHEEZE OR SHORTNESS OF BREATH, Disp: 8.5 each, Rfl: 2   azelastine (ASTELIN) 0.1 % nasal spray, Place 2 sprays into both nostrils 2 (two) times daily. Use in each nostril as directed, Disp: 30 mL, Rfl: 5   blood glucose meter kit and supplies KIT, Dispense based on patient and insurance preference. Use up to four times daily as directed. (FOR ICD-9 250.00, 250.01)., Disp: 1 each, Rfl: 0   buPROPion (WELLBUTRIN SR) 150 MG 12 hr tablet, Take 150 mg by mouth daily., Disp: , Rfl:    clonazePAM (KLONOPIN) 0.5 MG tablet, TAKE 1 TABLET BY MOUTH TWICE A DAY AS NEEDED FOR ANXIETY, Disp: 60 tablet, Rfl: 5   Continuous Blood Gluc Receiver (FREESTYLE LIBRE READER) DEVI, 1 Device by Does not apply route daily as needed (to check blood sugar)., Disp: 1 Device, Rfl: 0   Continuous Blood Gluc Sensor (FREESTYLE LIBRE SENSOR SYSTEM) MISC, Apply 1 sensor to skin every 14 days., Disp: 2 each, Rfl: 11   lamoTRIgine (LAMICTAL) 200 MG tablet, Take 200 mg by mouth. , Disp: , Rfl:    metFORMIN (GLUCOPHAGE) 1000 MG tablet, TAKE 1 TABLET BY MOUTH TWICE A DAY WITH A MEAL, Disp: 180 tablet, Rfl: 3   ondansetron (ZOFRAN-ODT) 4 MG disintegrating tablet, DISSOLVE 1 TABLET ON TONGUE EVERY 8 HOURS AS NEEDED FOR NAUSEA AND VOMITING, Disp: 9 tablet, Rfl: 5   pantoprazole (PROTONIX) 40 MG tablet, TAKE 1 TABLET BY MOUTH EVERY DAY IN THE EVENING, Disp: 90 tablet, Rfl: 0   pregabalin (LYRICA) 150 MG capsule, Take 1 capsule (150 mg total) by mouth 2 (two) times daily., Disp: 60 capsule, Rfl: 0   sodium chloride (OCEAN) 0.65 % SOLN nasal spray, Place 1 spray into both nostrils as needed for congestion., Disp: 44 mL, Rfl: 0   tiZANidine (ZANAFLEX) 4 MG tablet, TAKE ONE TABLET BY MOUTH THREE TIMES DAILY, Disp: 90 tablet, Rfl: 0   TOSYMRA 10 MG/ACT SOLN, PLACE 1 SPRAY INTO THE NOSE EVERY HOUR AS NEEDED (MAXIMUM 3 SPRAYS IN 24 HOURS), Disp:  6 each, Rfl: 2   traMADol (ULTRAM) 50 MG tablet, Take 1 tablet (50 mg total) by mouth every 8 (eight) hours as needed., Disp: 90 tablet, Rfl: 5   Vitamin D, Ergocalciferol, (DRISDOL) 1.25 MG (50000 UNIT) CAPS capsule, Take 1 capsule (50,000 Units total) by mouth every 7 (seven) days., Disp: 12 capsule, Rfl: 0   zolpidem (AMBIEN) 5 MG tablet, Take 1 tablet (5 mg total) by mouth at bedtime as needed for sleep., Disp: 30 tablet, Rfl: 5  Observations/Objective: Patient is well-developed, well-nourished in no acute distress.  Resting comfortably at home.  Head is normocephalic, atraumatic.  No labored breathing.  Speech is clear and coherent with logical content.  Patient is alert and oriented at baseline.    Assessment and Plan: 1. Acute bacterial sinusitis  - Worsening symptoms that have not responded to OTC medications.  -  Will give augmentin - Tessalon for cough - Prednisone for inflammation - Continue allergy medications.  - Stay well hydrated and get plenty of rest.  - Call or seek in person evaluation if no symptom improvement or if symptoms worsen.   Follow Up Instructions: I discussed the assessment and treatment plan with the patient. The patient was provided an opportunity to ask questions and all were answered. The patient agreed with the plan and demonstrated an understanding of the instructions.  A copy of instructions were sent to the patient via MyChart unless otherwise noted below.    The patient was advised to call back or seek an in-person evaluation if the symptoms worsen or if the condition fails to improve as anticipated.  Time:  I spent 11 minutes with the patient via telehealth technology discussing the above problems/concerns.    Mar Daring, PA-C

## 2020-12-27 ENCOUNTER — Ambulatory Visit: Payer: BC Managed Care – PPO | Admitting: Neurology

## 2021-01-03 ENCOUNTER — Telehealth: Payer: Self-pay

## 2021-01-03 NOTE — Telephone Encounter (Signed)
New message   I called [phone] and left a detailed message for the patient to contact [facility] for Botox consent.   Upcoming appt on 1.6.2023

## 2021-01-09 ENCOUNTER — Other Ambulatory Visit: Payer: Self-pay | Admitting: Family Medicine

## 2021-01-09 NOTE — Progress Notes (Deleted)
Cardiology Office Note:    Date:  01/09/2021   ID:  Heidi Holland, DOB December 23, 1975, MRN [id number]  PCP:  Ardith Dark, MD   [facility] Providers Cardiologist:  new to Kazimir Hartnett  Click to update primary MD,subspecialty MD or APP then REFRESH:1}    Referring MD: Ardith Dark, MD   No chief complaint on file. ***  History of Present Illness:    Heidi Holland is a 45 y.o. female with a family hx of CAD.  We were asked to see her for further evaluation of her CAD risks by Dr. [name].    Past Medical History:  Diagnosis Date   Allergy    Anxiety    Asthma    Depression    Diabetes mellitus without complication (HCC)    Diverticulitis    GERD (gastroesophageal reflux disease)    HPV in female    Hyperlipidemia    Migraines     Past Surgical History:  Procedure Laterality Date   BOWEL RESECTION     gasticbypass     GASTRIC BYPASS     KNEE ARTHROSCOPY     TOTAL HIP ARTHROPLASTY Left     Current Medications: No outpatient medications have been marked as taking for the 01/13/21 encounter (Appointment) with Aqueelah Cotrell, Deloris Ping, MD.     Allergies:   Ciprofloxacin   Social History   Socioeconomic History   Marital status: Single    Spouse name: Not on file   Number of children: 0   Years of education: 14   Highest education level: Associate degree: occupational, Scientist, product/process development, or vocational program  Occupational History    Employer: [employer]  Tobacco Use   Smoking status: Never   Smokeless tobacco: Never  Vaping Use   Vaping Use: Never used  Substance and Sexual Activity   Alcohol use: Yes    Comment: Occ   Drug use: Yes    Types: Marijuana    Comment: THC   Sexual activity: Never  Other Topics Concern   Not on file  Social History Narrative   Right handed.    Social Determinants of Health   Financial Resource Strain: Not on file  Food Insecurity: Not on file  Transportation Needs: Not on file  Physical Activity: Not on file  Stress: Not on file   Social Connections: Not on file     Family History: The patient's ***family history includes Arthritis in her mother; Breast cancer in her paternal aunt; COPD in her mother; Cancer in her mother; Diabetes in her father; Heart disease in her father and mother; Lung cancer in her mother; Miscarriages / Stillbirths in her mother; Rheum arthritis in her mother.  ROS:   Please see the history of present illness.    *** All other systems reviewed and are negative.  EKGs/Labs/Other Studies Reviewed:    The following studies were reviewed today: ***  EKG:  EKG is *** ordered today.  The ekg ordered today demonstrates ***  Recent Labs: 07/21/2020: TSH 0.75 08/03/2020: ALT 13; BUN 15; Creatinine, Ser 0.72; Hemoglobin 13.2; Platelets 332; Potassium 3.7; Sodium 137  Recent Lipid Panel    Component Value Date/Time   CHOL 117 12/15/2019 1026   TRIG 150 (H) 12/15/2019 1026   HDL 36 (L) 12/15/2019 1026   CHOLHDL 3.3 12/15/2019 1026   VLDL 25.2 09/17/2018 0842   LDLCALC 58 12/15/2019 1026     Risk Assessment/Calculations:   {Does this patient have ATRIAL FIBRILLATION?:[phone]}  Physical Exam:    VS:  There were no vitals taken for this visit.    Wt Readings from Last 3 Encounters:  10/24/20 211 lb (95.7 kg)  07/21/20 211 lb (95.7 kg)  04/25/20 206 lb (93.4 kg)     GEN: *** Well nourished, well developed in no acute distress HEENT: Normal NECK: No JVD; No carotid bruits LYMPHATICS: No lymphadenopathy CARDIAC: ***RRR, no murmurs, rubs, gallops RESPIRATORY:  Clear to auscultation without rales, wheezing or rhonchi  ABDOMEN: Soft, non-tender, non-distended MUSCULOSKELETAL:  No edema; No deformity  SKIN: Warm and dry NEUROLOGIC:  Alert and oriented x 3 PSYCHIATRIC:  Normal affect   ASSESSMENT:    No diagnosis found. PLAN:    In order of problems listed above:  ***      {Are you ordering a CV Procedure (e.g. stress test, cath, DCCV, TEE, etc)?   Press F2         :[id number]}    Medication Adjustments/Labs and Tests Ordered: Current medicines are reviewed at length with the patient today.  Concerns regarding medicines are outlined above.  No orders of the defined types were placed in this encounter.  No orders of the defined types were placed in this encounter.   There are no Patient Instructions on file for this visit.   Signed, Kristeen Miss, MD  01/09/2021 5:08 PM    [facility]

## 2021-01-13 ENCOUNTER — Ambulatory Visit: Payer: BC Managed Care – PPO | Admitting: Cardiovascular Disease

## 2021-01-16 ENCOUNTER — Other Ambulatory Visit: Payer: Self-pay | Admitting: Family Medicine

## 2021-01-19 ENCOUNTER — Other Ambulatory Visit: Payer: Self-pay

## 2021-01-19 ENCOUNTER — Encounter: Payer: Self-pay | Admitting: Obstetrics & Gynecology

## 2021-01-19 ENCOUNTER — Ambulatory Visit (INDEPENDENT_AMBULATORY_CARE_PROVIDER_SITE_OTHER): Payer: BC Managed Care – PPO | Admitting: Obstetrics & Gynecology

## 2021-01-19 ENCOUNTER — Other Ambulatory Visit (HOSPITAL_COMMUNITY)
Admission: RE | Admit: 2021-01-19 | Discharge: 2021-01-19 | Disposition: A | Discharge status: Home / Self Care | Payer: BC Managed Care – PPO | Source: Ambulatory Visit | Attending: Obstetrics & Gynecology | Admitting: Obstetrics & Gynecology

## 2021-01-19 VITALS — BP 145/85 | HR 92 | Ht 63.0 in | Wt 218.5 lb

## 2021-01-19 DIAGNOSIS — Z113 Encounter for screening for infections with a predominantly sexual mode of transmission: Secondary | ICD-10-CM | POA: Insufficient documentation

## 2021-01-19 DIAGNOSIS — Z8742 Personal history of other diseases of the female genital tract: Secondary | ICD-10-CM | POA: Diagnosis not present

## 2021-01-19 DIAGNOSIS — Z124 Encounter for screening for malignant neoplasm of cervix: Secondary | ICD-10-CM

## 2021-01-19 NOTE — Progress Notes (Signed)
Patient ID: Alys Dulak, female   DOB: 19-Feb-1975, 45 y.o.   MRN: [id number]  Chief Complaint  Patient presents with   Gynecologic Exam    HPI Brindley Madarang is a 45 y.o. female.  G0P0000 Patient's last menstrual period was 12/28/2020. Menses irregular every 3-5 weeks. She has LSIL in 2020 and needs pap f/u. Not currently sexually active. She states she was told she had PCOS and a left ovarian cyst was seen on CT in June. Mammogram is scheduled HPI  Past Medical History:  Diagnosis Date   Allergy    Anxiety    Asthma    Depression    Diabetes mellitus without complication (HCC)    Diverticulitis    GERD (gastroesophageal reflux disease)    HPV in female    Hyperlipidemia    Migraines     Past Surgical History:  Procedure Laterality Date   BOWEL RESECTION     gasticbypass     GASTRIC BYPASS     KNEE ARTHROSCOPY     TOTAL HIP ARTHROPLASTY Left     Family History  Problem Relation Age of Onset   Heart disease Mother    Lung cancer Mother    Rheum arthritis Mother    Arthritis Mother    Cancer Mother    COPD Mother    7 / [place] Mother    Diabetes Father    Heart disease Father    Breast cancer Paternal Aunt     Social History Social History   Tobacco Use   Smoking status: Never   Smokeless tobacco: Never  Vaping Use   Vaping Use: Never used  Substance Use Topics   Alcohol use: Not Currently    Comment: Occ   Drug use: Yes    Types: Marijuana    Comment: THC    Allergies  Allergen Reactions   Ciprofloxacin Itching    Current Outpatient Medications  Medication Sig Dispense Refill   albuterol (VENTOLIN HFA) 108 (90 Base) MCG/ACT inhaler INHALE 2 PUFFS BY MOUTH EVERY 6 HOURS AS NEEDED FOR WHEEZE OR SHORTNESS OF BREATH 8.5 each 2   azelastine (ASTELIN) 0.1 % nasal spray Place 2 sprays into both nostrils 2 (two) times daily. Use in each nostril as directed 30 mL 5   blood glucose meter kit and supplies KIT Dispense based on patient and  insurance preference. Use up to four times daily as directed. (FOR ICD-9 250.00, 250.01). 1 each 0   buPROPion (WELLBUTRIN SR) 150 MG 12 hr tablet Take 150 mg by mouth daily.     clonazePAM (KLONOPIN) 0.5 MG tablet TAKE 1 TABLET BY MOUTH TWICE A DAY AS NEEDED FOR ANXIETY 60 tablet 5   Continuous Blood Gluc Receiver (FREESTYLE LIBRE READER) DEVI 1 Device by Does not apply route daily as needed (to check blood sugar). 1 Device 0   Continuous Blood Gluc Sensor (FREESTYLE LIBRE SENSOR SYSTEM) MISC Apply 1 sensor to skin every 14 days. 2 each 11   lamoTRIgine (LAMICTAL) 200 MG tablet Take 200 mg by mouth.      metFORMIN (GLUCOPHAGE) 1000 MG tablet TAKE 1 TABLET BY MOUTH TWICE A DAY WITH A MEAL 180 tablet 3   ondansetron (ZOFRAN-ODT) 4 MG disintegrating tablet DISSOLVE 1 TABLET ON TONGUE EVERY 8 HOURS AS NEEDED FOR NAUSEA AND VOMITING 9 tablet 5   pantoprazole (PROTONIX) 40 MG tablet TAKE 1 TABLET BY MOUTH EVERY DAY IN THE EVENING 90 tablet 0   pregabalin (LYRICA) 150 MG capsule Take 1  capsule (150 mg total) by mouth 2 (two) times daily. 60 capsule 0   tiZANidine (ZANAFLEX) 4 MG tablet TAKE ONE TABLET BY MOUTH THREE TIMES DAILY 90 tablet 0   TOSYMRA 10 MG/ACT SOLN PLACE 1 SPRAY INTO THE NOSE EVERY HOUR AS NEEDED (MAXIMUM 3 SPRAYS IN 24 HOURS) 6 each 2   traMADol (ULTRAM) 50 MG tablet Take 1 tablet (50 mg total) by mouth every 8 (eight) hours as needed. 90 tablet 5   zolpidem (AMBIEN) 5 MG tablet Take 1 tablet (5 mg total) by mouth at bedtime as needed for sleep. 30 tablet 5   acetaminophen (TYLENOL) 325 MG tablet Take 650 mg by mouth every 6 (six) hours as needed for mild pain.     amoxicillin-clavulanate (AUGMENTIN) 875-125 MG tablet Take 1 tablet by mouth 2 (two) times daily. 20 tablet 0   benzonatate (TESSALON) 100 MG capsule Take 1 capsule (100 mg total) by mouth 3 (three) times daily as needed. 30 capsule 0   predniSONE (STERAPRED UNI-PAK 21 TAB) 10 MG (21) TBPK tablet 6 day taper; take as directed  on package instructions 21 tablet 0   sodium chloride (OCEAN) 0.65 % SOLN nasal spray Place 1 spray into both nostrils as needed for congestion. 44 mL 0   Vitamin D, Ergocalciferol, (DRISDOL) 1.25 MG (50000 UNIT) CAPS capsule Take 1 capsule (50,000 Units total) by mouth every 7 (seven) days. 12 capsule 0   No current facility-administered medications for this visit.    Review of Systems Review of Systems  Blood pressure (!) 145/85, pulse 92, height 5' 3"  (1.6 m), weight 218 lb 8 oz (99.1 kg), last menstrual period 12/28/2020.  Physical Exam Physical Exam Vitals and nursing note reviewed. Exam conducted with a chaperone present.  Constitutional:      Appearance: She is obese. She is not ill-appearing.  HENT:     Head: Normocephalic.  Eyes:     Pupils: Pupils are equal, round, and reactive to light.  Cardiovascular:     Rate and Rhythm: Normal rate.     Heart sounds: Normal heart sounds.  Pulmonary:     Effort: Pulmonary effort is normal.     Breath sounds: Normal breath sounds.  Abdominal:     General: There is no distension.     Palpations: Abdomen is soft. There is no mass.     Tenderness: There is no abdominal tenderness.  Genitourinary:    General: Normal vulva.     Exam position: Lithotomy position.     Vagina: Normal.     Cervix: No cervical motion tenderness or discharge.     Uterus: Normal.      Adnexa: Right adnexa normal and left adnexa normal.  Musculoskeletal:        General: Normal range of motion.  Skin:    General: Skin is warm and dry.  Neurological:     General: No focal deficit present.     Mental Status: She is alert.  Psychiatric:        Mood and Affect: Mood normal.        Behavior: Behavior normal.    Data Reviewed Pap and Bx results  Assessment Screening for cervical cancer - Plan: Cytology - PAP( [place])  Screening for STDs (sexually transmitted diseases) - Plan: HIV Antibody (routine testing w rflx), RPR, Hepatitis B Surface  AntiGEN, Cervicovaginal ancillary only( [place])  History of PCOS - Plan: FSH/LH, US PELVIC COMPLETE WITH TRANSVAGINAL   Plan F/U with pap  in 12 months pending pap result and orders for imaging placed.    Emeterio Reeve 01/19/2021, 8:53 AM

## 2021-01-20 LAB — HEPATITIS B SURFACE ANTIGEN: Hepatitis B Surface Ag: NEGATIVE

## 2021-01-20 LAB — CERVICOVAGINAL ANCILLARY ONLY
Chlamydia: NEGATIVE
Comment: NEGATIVE
Comment: NEGATIVE
Comment: NORMAL
Neisseria Gonorrhea: NEGATIVE
Trichomonas: NEGATIVE

## 2021-01-20 LAB — FSH/LH
FSH: 4.2 m[IU]/mL
LH: 6.4 m[IU]/mL

## 2021-01-20 LAB — HIV ANTIBODY (ROUTINE TESTING W REFLEX): HIV Screen 4th Generation wRfx: NONREACTIVE

## 2021-01-20 LAB — RPR: RPR Ser Ql: NONREACTIVE

## 2021-01-21 ENCOUNTER — Other Ambulatory Visit: Payer: Self-pay | Admitting: Family Medicine

## 2021-01-21 DIAGNOSIS — G43709 Chronic migraine without aura, not intractable, without status migrainosus: Secondary | ICD-10-CM

## 2021-01-23 ENCOUNTER — Ambulatory Visit: Payer: BC Managed Care – PPO

## 2021-01-24 LAB — CYTOLOGY - PAP
Comment: NEGATIVE
Comment: NEGATIVE
HPV 16: NEGATIVE
HPV 18 / 45: POSITIVE — AB
High risk HPV: POSITIVE — AB

## 2021-01-26 ENCOUNTER — Encounter: Payer: Self-pay | Admitting: Obstetrics & Gynecology

## 2021-01-27 ENCOUNTER — Ambulatory Visit: Payer: BC Managed Care – PPO | Admitting: Neurology

## 2021-01-27 ENCOUNTER — Other Ambulatory Visit: Payer: Self-pay | Admitting: Neurology

## 2021-01-30 ENCOUNTER — Telehealth: Payer: Self-pay | Admitting: General Practice

## 2021-01-30 NOTE — Telephone Encounter (Signed)
Called patient and informed her pap results & need for colposcopy. Discussed someone from the front office would contact her with an appt. Patient verbalized understanding and asked if she should take the day off of work. Told patient she no she doesn't have to unless she wants to. Advised most people return to work after appt. Patient verbalized understanding.

## 2021-01-31 ENCOUNTER — Other Ambulatory Visit: Payer: Self-pay

## 2021-01-31 ENCOUNTER — Ambulatory Visit (HOSPITAL_COMMUNITY)
Admission: RE | Admit: 2021-01-31 | Discharge: 2021-01-31 | Disposition: A | Discharge status: Home / Self Care | Payer: BC Managed Care – PPO | Source: Ambulatory Visit | Attending: Obstetrics & Gynecology | Admitting: Obstetrics & Gynecology

## 2021-01-31 ENCOUNTER — Other Ambulatory Visit: Payer: BC Managed Care – PPO

## 2021-01-31 DIAGNOSIS — Z8742 Personal history of other diseases of the female genital tract: Secondary | ICD-10-CM | POA: Diagnosis not present

## 2021-01-31 DIAGNOSIS — E282 Polycystic ovarian syndrome: Secondary | ICD-10-CM | POA: Diagnosis not present

## 2021-01-31 NOTE — Telephone Encounter (Signed)
F/u  Left a detailed message at  [phone] to contacted [facility] for Botox consent.   Upcoming appt on  1.6.2023

## 2021-02-03 NOTE — Telephone Encounter (Signed)
F/u   Call [facility] at  [phone] spoke with a rep the patient's has not given consent.     Per Rep the patient has a past-due balance of $ 335.00 that needs to be taken care of first, they will reach out to the patient for consent.

## 2021-02-07 ENCOUNTER — Telehealth: Payer: Self-pay | Admitting: Lactation Services

## 2021-02-07 NOTE — Telephone Encounter (Signed)
Called pt and pt notified of results and that her colpo appt is scheduled for 02/28/21.  Pt verbalized understanding with no further questions.   Leonette Nutting  02/07/21

## 2021-02-07 NOTE — Telephone Encounter (Signed)
Called patient with results of Pap Smear and recommendation for Colpo. Patient did not answer. LM for her to call the office at [phone] for results.

## 2021-02-07 NOTE — Telephone Encounter (Signed)
-----   Message from Adam Phenix, MD sent at 02/06/2021  3:54 PM EST ----- LSIL needs colposcopy

## 2021-02-10 DIAGNOSIS — F411 Generalized anxiety disorder: Secondary | ICD-10-CM | POA: Diagnosis not present

## 2021-02-10 DIAGNOSIS — F4312 Post-traumatic stress disorder, chronic: Secondary | ICD-10-CM | POA: Diagnosis not present

## 2021-02-10 DIAGNOSIS — F3112 Bipolar disorder, current episode manic without psychotic features, moderate: Secondary | ICD-10-CM | POA: Diagnosis not present

## 2021-02-12 ENCOUNTER — Other Ambulatory Visit: Payer: Self-pay | Admitting: Family Medicine

## 2021-02-14 ENCOUNTER — Telehealth: Payer: Self-pay

## 2021-02-14 NOTE — Telephone Encounter (Signed)
F/u   Receive a delay notification of Botox medication  Call [facility] this morning at [phone] on shipment status.   The patient has a current balance and payment arrangement set up, which pushes back the delivery date - ends up going back into insurance verification   [facility] is aware of the upcoming appointment on 02/17/21 with Dr. Everlena Cooper.

## 2021-02-15 NOTE — Telephone Encounter (Signed)
F/u   Call [facility] this morning status updated advise patient insurance went back into insurance verification status.   Per [facility] Rep do not know why a 24-hour time frame was given to call back,   1. No Botox delivery date has been made  for the patient

## 2021-02-16 ENCOUNTER — Other Ambulatory Visit: Payer: Self-pay | Admitting: *Deleted

## 2021-02-16 MED ORDER — PREGABALIN 150 MG PO CAPS: 150.0000 mg | ORAL_CAPSULE | Freq: Two times a day (BID) | ORAL | 5 refills | Status: DC

## 2021-02-17 ENCOUNTER — Other Ambulatory Visit: Payer: Self-pay | Admitting: Family Medicine

## 2021-02-17 ENCOUNTER — Ambulatory Visit: Payer: BC Managed Care – PPO | Admitting: Neurology

## 2021-02-17 MED ORDER — CLONAZEPAM 0.5 MG PO TABS: 0.5000 mg | ORAL_TABLET | Freq: Two times a day (BID) | ORAL | 5 refills | Status: DC | PRN

## 2021-02-17 NOTE — Telephone Encounter (Signed)
F/u   I called Heidi Holland at [phone] and spoke with [facility] regarding the delivery of Botox medication.  I was informed by Meredith Mody the patient's insurance was termed.   I called the patient she confirmed she has new insurance [employer].  She owes [facility] $ 300.00.  I suggested this to the patient.   1. Upload the new insurance card to her Mychart account  2. Botox will require a new prior authorization  3. Advise asking her new insurance which Specialty Pharmacy they are using.   The patient verbalized understanding.

## 2021-02-17 NOTE — Telephone Encounter (Signed)
Patient called stating that she needs a refill of clonazePAM (KLONOPIN) 0.5 MG tablet sent to the [facility]. Thank You!!!       [facility].

## 2021-02-17 NOTE — Telephone Encounter (Signed)
F/u   Mailing out Botoxsaving program information to the patient

## 2021-02-17 NOTE — Addendum Note (Signed)
Addended by: Dyann Kief on: 02/17/2021 12:43 PM   Modules accepted: Orders

## 2021-02-22 ENCOUNTER — Other Ambulatory Visit: Payer: Self-pay | Admitting: Family Medicine

## 2021-02-22 ENCOUNTER — Telehealth: Payer: Self-pay

## 2021-02-22 NOTE — Telephone Encounter (Signed)
F/u  The patient called [phone]  to upload a new insurance card patient verbalized that it was already done.   I informed the patient nothing in epic showed her new insurance card for the year 2023.   The patient verbalized understanding and will take care of it today.

## 2021-02-22 NOTE — Telephone Encounter (Signed)
New message   Benefit Verification [id number] Submitted! For BV Basic submissions, please allow 3-5 business days for results.  For BV Full submissions, please allow 7 business days for results.

## 2021-02-22 NOTE — Telephone Encounter (Signed)
Pt called and said she needs to get a refill on traMADol (ULTRAM) 50 MG tablet and she needs it sent to a new pharmacy. [facility] pharmacy Address: [address] [place], [place], [place] [address] Phone: [phone]  Call back if needed [phone]

## 2021-02-23 MED ORDER — TRAMADOL HCL 50 MG PO TABS: 50.0000 mg | ORAL_TABLET | Freq: Three times a day (TID) | ORAL | 5 refills | Status: DC | PRN

## 2021-02-24 ENCOUNTER — Ambulatory Visit (INDEPENDENT_AMBULATORY_CARE_PROVIDER_SITE_OTHER): Payer: 59

## 2021-02-24 ENCOUNTER — Ambulatory Visit (HOSPITAL_COMMUNITY)
Admission: EM | Admit: 2021-02-24 | Discharge: 2021-02-24 | Disposition: A | Discharge status: Home / Self Care | Payer: 59 | Attending: Physician Assistant | Admitting: Physician Assistant

## 2021-02-24 ENCOUNTER — Encounter (HOSPITAL_COMMUNITY): Payer: Self-pay | Admitting: Emergency Medicine

## 2021-02-24 ENCOUNTER — Other Ambulatory Visit: Payer: Self-pay

## 2021-02-24 DIAGNOSIS — M5442 Lumbago with sciatica, left side: Secondary | ICD-10-CM

## 2021-02-24 DIAGNOSIS — M5441 Lumbago with sciatica, right side: Secondary | ICD-10-CM

## 2021-02-24 DIAGNOSIS — M545 Low back pain, unspecified: Secondary | ICD-10-CM

## 2021-02-24 MED ORDER — BACLOFEN 10 MG PO TABS: 10.0000 mg | ORAL_TABLET | Freq: Two times a day (BID) | ORAL | 0 refills | Status: DC

## 2021-02-24 MED ORDER — HYDROCODONE-ACETAMINOPHEN 5-325 MG PO TABS: 1.0000 | ORAL_TABLET | Freq: Every evening | ORAL | 0 refills | Status: AC | PRN

## 2021-02-24 MED ORDER — KETOROLAC TROMETHAMINE 60 MG/2ML IM SOLN
60.0000 mg | Freq: Once | INTRAMUSCULAR | Status: AC
  Administered 2021-02-24: 60 mg via INTRAMUSCULAR

## 2021-02-24 MED ORDER — KETOROLAC TROMETHAMINE 60 MG/2ML IM SOLN
INTRAMUSCULAR | Status: AC
  Filled 2021-02-24: qty 2

## 2021-02-24 MED ORDER — PREDNISONE 10 MG (21) PO TBPK: ORAL_TABLET | ORAL | 0 refills | Status: DC

## 2021-02-24 NOTE — Telephone Encounter (Addendum)
F/u   I called the patient at [phone] and left a detailed voicemail appointment with Dr. Everlena Cooper on 03/01/2021 will be canceled.   1.   Insurance card has not been uploaded to my chart account.  2.   Prior Authorization has not been obtained.   3.   MD/ CMA aware of appointment cancel out

## 2021-02-24 NOTE — ED Provider Notes (Signed)
[facility]    CSN: [id number] Arrival date & time: 02/24/21  1202      History   Chief Complaint Chief Complaint  Patient presents with   Back Pain    HPI Heidi Holland is a 46 y.o. female.   Patient presents today with a 4-day history of worsening lower back pain.  Reports that symptoms began after she was lifting heavy items and have progressively worsened.  Pain is rated 10 on a 0-10 pain scale, localized to lower back with radiation into bilateral legs, described as intense aching with periodic shooting pains, worse with prolonged sitting or certain movements, no alleviating factors identified.  She does have a history of lower back pain but states current symptoms are more extreme than previous episodes of this condition.  She denies any previous spinal surgery but has had interfacet injections though not recently.  She is followed by [facility] but has not seen them recently.  She denies any bowel/bladder incontinence, lower extremity weakness, saddle anesthesia.  She has tried Tylenol, ibuprofen, Zanaflex, tramadol without improvement.  Reports pain is excruciating and she is unable to sleep as a result of symptoms.  She is requesting pain medication if appropriate today.   Past Medical History:  Diagnosis Date   Allergy    Anxiety    Asthma    Depression    Diabetes mellitus without complication ([place])    Diverticulitis    GERD (gastroesophageal reflux disease)    HPV in female    Hyperlipidemia    Migraines     Patient Active Problem List   Diagnosis Date Noted   Pap smear abnormality of cervix/human papillomavirus (HPV) positive 10/24/2020   Family history of early CAD 10/24/2020   B12 deficiency 07/26/2020   Somatic dysfunction of spine, cervical 07/21/2020   Status post left hip replacement 03/12/2019   T2DM (type 2 diabetes mellitus) ([place]) 07/31/2018   Dyslipidemia associated with type 2 diabetes mellitus ([place]) 07/31/2018   Allergic rhinitis  07/31/2018   Mild intermittent asthma, uncomplicated 70/26/3785   GERD (gastroesophageal reflux disease) 07/31/2018   S/P gastric bypass 07/31/2018   Migraine 07/31/2018   Anxiety 07/31/2018   Depression, major, single episode, complete remission ([place]) 07/31/2018   Insomnia 88/50/2774   Umbilical hernia 12/87/8676   Iron deficiency anemia 06/09/2018   Cervicogenic headache 05/12/2018   Low back pain 05/12/2018   Polyarthralgia 05/12/2018    Past Surgical History:  Procedure Laterality Date   BOWEL RESECTION     gasticbypass     GASTRIC BYPASS     KNEE ARTHROSCOPY     TOTAL HIP ARTHROPLASTY Left     OB History     Gravida  0   Para  0   Term  0   Preterm  0   AB  0   Living  0      SAB  0   IAB  0   Ectopic  0   Multiple  0   Live Births  0            Home Medications    Prior to Admission medications   Medication Sig Start Date End Date Taking? Authorizing Provider  baclofen (LIORESAL) 10 MG tablet Take 1 tablet (10 mg total) by mouth 2 (two) times daily. 02/24/21  Yes Troyce Febo, Derry Skill, PA-C  HYDROcodone-acetaminophen (NORCO/VICODIN) 5-325 MG tablet Take 1 tablet by mouth at bedtime as needed for up to 3 days. 02/24/21 02/27/21 Yes Aanshi Batchelder, Derry Skill,  PA-C  predniSONE (STERAPRED UNI-PAK 21 TAB) 10 MG (21) TBPK tablet As directed 02/24/21  Yes Rella Egelston K, PA-C  acetaminophen (TYLENOL) 325 MG tablet Take 650 mg by mouth every 6 (six) hours as needed for mild pain.    [provider]  albuterol (VENTOLIN HFA) 108 (90 Base) MCG/ACT inhaler INHALE 2 PUFFS BY MOUTH EVERY 6 HOURS AS NEEDED FOR WHEEZE OR SHORTNESS OF BREATH 01/23/21   Vivi Barrack, MD  amoxicillin-clavulanate (AUGMENTIN) 875-125 MG tablet Take 1 tablet by mouth 2 (two) times daily. 12/23/20   Mar Daring, PA-C  azelastine (ASTELIN) 0.1 % nasal spray Place 2 sprays into both nostrils 2 (two) times daily. Use in each nostril as directed 07/26/20   Vivi Barrack, MD  benzonatate  (TESSALON) 100 MG capsule Take 1 capsule (100 mg total) by mouth 3 (three) times daily as needed. 12/23/20   Mar Daring, PA-C  blood glucose meter kit and supplies KIT Dispense based on patient and insurance preference. Use up to four times daily as directed. (FOR ICD-9 250.00, 250.01). 07/31/18   Vivi Barrack, MD  buPROPion [facility] SR) 150 MG 12 hr tablet Take 150 mg by mouth daily.    [provider]  clonazePAM (KLONOPIN) 0.5 MG tablet Take 1 tablet (0.5 mg total) by mouth 2 (two) times daily as needed for anxiety. 02/17/21   Vivi Barrack, MD  Continuous Blood Gluc Receiver (FREESTYLE LIBRE READER) DEVI 1 Device by Does not apply route daily as needed (to check blood sugar). 08/14/18   Vivi Barrack, MD  Continuous Blood Gluc Sensor (FREESTYLE LIBRE SENSOR SYSTEM) MISC Apply 1 sensor to skin every 14 days. 08/14/18   Vivi Barrack, MD  lamoTRIgine (LAMICTAL) 200 MG tablet Take 200 mg by mouth.  07/09/19   [provider]  metFORMIN (GLUCOPHAGE) 1000 MG tablet TAKE 1 TABLET BY MOUTH TWICE A DAY WITH A MEAL 12/31/19   Vivi Barrack, MD  ondansetron (ZOFRAN-ODT) 4 MG disintegrating tablet DISSOLVE 1 TABLET ON TONGUE EVERY 8 HOURS AS NEEDED FOR NAUSEA AND VOMITING 01/23/21   Vivi Barrack, MD  pantoprazole (PROTONIX) 40 MG tablet TAKE 1 TABLET BY MOUTH EVERY DAY IN THE EVENING 01/09/21   Vivi Barrack, MD  pregabalin (LYRICA) 150 MG capsule Take 1 capsule (150 mg total) by mouth 2 (two) times daily. 02/16/21   Vivi Barrack, MD  sodium chloride (OCEAN) 0.65 % SOLN nasal spray Place 1 spray into both nostrils as needed for congestion. 09/08/19   Darr, Edison Nasuti, PA-C  TOSYMRA 10 MG/ACT SOLN PLACE 1 SPRAY INTO THE NOSE EVERY HOUR AS NEEDED (MAXIMUM 3 SPRAYS IN 24 HOURS) 12/01/20   Tomi Likens, Adam R, DO  traMADol (ULTRAM) 50 MG tablet Take 1 tablet (50 mg total) by mouth every 8 (eight) hours as needed. 02/23/21   Vivi Barrack, MD  Vitamin D, Ergocalciferol, (DRISDOL) 1.25  MG (50000 UNIT) CAPS capsule Take 1 capsule (50,000 Units total) by mouth every 7 (seven) days. 07/22/20   Lyndal Pulley, DO  zolpidem (AMBIEN) 5 MG tablet Take 1 tablet (5 mg total) by mouth at bedtime as needed for sleep. 10/23/19   Vivi Barrack, MD    Family History Family History  Problem Relation Age of Onset   Heart disease Mother    Lung cancer Mother    Rheum arthritis Mother    Arthritis Mother    Cancer Mother    COPD Mother  Miscarriages / Stillbirths Mother    Diabetes Father    Heart disease Father    Breast cancer Paternal Aunt     Social History Social History   Tobacco Use   Smoking status: Never   Smokeless tobacco: Never  Vaping Use   Vaping Use: Never used  Substance Use Topics   Alcohol use: Not Currently    Comment: Occ   Drug use: Yes    Types: Marijuana    Comment: THC     Allergies   Ciprofloxacin   Review of Systems Review of Systems  Constitutional:  Positive for activity change. Negative for appetite change, fatigue and fever.  Respiratory:  Negative for cough and shortness of breath.   Cardiovascular:  Negative for chest pain.  Gastrointestinal:  Negative for abdominal pain, diarrhea, nausea and vomiting.  Musculoskeletal:  Positive for back pain. Negative for arthralgias and myalgias.  Neurological:  Negative for dizziness, weakness, light-headedness, numbness and headaches.    Physical Exam Triage Vital Signs ED Triage Vitals  Enc Vitals Group     BP 02/24/21 1259 113/73     Pulse Rate 02/24/21 1259 70     Resp 02/24/21 1259 16     Temp 02/24/21 1259 98.7 F (37.1 C)     Temp Source 02/24/21 1259 Oral     SpO2 02/24/21 1259 96 %     Weight --      Height --      Head Circumference --      Peak Flow --      Pain Score 02/24/21 1258 10     Pain Loc --      Pain Edu? --      Excl. in [place]? --    No data found.  Updated Vital Signs BP 113/73 (BP Location: Right Arm)    Pulse 70    Temp 98.7 F (37.1 C) (Oral)     Resp 16    LMP 02/20/2021    SpO2 96%   Visual Acuity Right Eye Distance:   Left Eye Distance:   Bilateral Distance:    Right Eye Near:   Left Eye Near:    Bilateral Near:     Physical Exam Vitals reviewed.  Constitutional:      General: She is awake. She is not in acute distress.    Appearance: Normal appearance. She is well-developed. She is not ill-appearing.     Comments: Very pleasant female appears stated age obviously uncomfortable in no acute distress  HENT:     Head: Normocephalic and atraumatic.  Cardiovascular:     Rate and Rhythm: Normal rate and regular rhythm.     Heart sounds: Normal heart sounds, S1 normal and S2 normal. No murmur heard. Pulmonary:     Effort: Pulmonary effort is normal.     Breath sounds: Normal breath sounds. No wheezing, rhonchi or rales.     Comments: Clear to auscultation bilaterally Abdominal:     Palpations: Abdomen is soft.     Tenderness: There is no abdominal tenderness.  Musculoskeletal:     Cervical back: No tenderness or bony tenderness.     Thoracic back: No tenderness or bony tenderness.     Lumbar back: Tenderness and bony tenderness present.     Comments: Pain percussion of lumbar vertebrae.  Tenderness palpation of lumbar paraspinal muscles bilaterally.  Strength 5/5 bilateral lower extremities.  Patient unable to lie flat for straight leg raise.  Psychiatric:  Behavior: Behavior is cooperative.     UC Treatments / Results  Labs (all labs ordered are listed, but only abnormal results are displayed) Labs Reviewed - No data to display  EKG   Radiology DG Lumbar Spine Complete  Result Date: 02/24/2021 CLINICAL DATA:  pain that is significantly worsened EXAM: LUMBAR SPINE - COMPLETE 4+ VIEW COMPARISON:  Radiograph 07/21/2020 FINDINGS: There is no evidence of lumbar spine fracture. Relatively preserved disc heights. Partially visualized left hip arthroplasty. Pain has significantly worsened. Mild L4-L5 and L5-S1  facet arthropathy. IMPRESSION: Mild facet arthropathy at L4-L5 and L5-S1. Electronically Signed   By: Maurine Simmering M.D.   On: 02/24/2021 13:29    Procedures Procedures (including critical care time)  Medications Ordered in UC Medications  ketorolac (TORADOL) injection 60 mg (60 mg Intramuscular Given 02/24/21 1334)    Initial Impression / Assessment and Plan / UC Course  I have reviewed the triage vital signs and the nursing notes.  Pertinent labs & imaging results that were available during my care of the patient were reviewed by me and considered in my medical decision making (see chart for details).     X-ray obtained showed degenerative changes at L4/L5 and L5/S1 which are changed compared to previous imaging.  No acute findings on x-ray.  Patient was given Toradol without improvement of symptoms.  She has tried multiple over-the-counter medications that have all been ineffective.  She was started on prednisone taper; reports history of diabetes but states this is very well controlled.  She will monitor her blood sugars at home and contact us or see her PCP if persistently above 200.  She is to avoid carbohydrates and drink plenty of fluid.  She was given baclofen for muscle relaxation with instruction not to drive or drink alcohol while taking this medication.  She reports extreme pain and was given 3 doses of hydrocodone.  She is currently prescribed pregabalin, Klonopin, tramadol reports that tramadol is been ineffective and she stopped this medication.  She uses Klonopin and pregabalin as needed and will hold these medications while taking hydrocodone.  Discussed that this is sedating and she should not drive or drink alcohol taking it.  Encouraged conservative treatment including heat, rest, stretch.  She is followed by [facility] and I recommended she call to schedule an appointment as soon as possible as she will likely need reevaluation and more advanced imaging if symptoms do not resolve  quickly.  She was provided work excuse note.  Discussed alarm symptoms that warrant emergent evaluation.  Strict return precautions given to which she expressed understanding.  Final Clinical Impressions(s) / UC Diagnoses   Final diagnoses:  Acute bilateral low back pain with bilateral sciatica     Discharge Instructions      Your x-ray did not show any acute findings but did show some degenerative changes.  Please start baclofen twice daily as needed.  This will make you sleepy so do not drive or drink alcohol while taking it.  Start prednisone taper.  Do not take NSAIDs including aspirin, ibuprofen/Advil, naproxen/Aleve with this medication as it can cause stomach bleeding.  I have called in 3 doses of hydrocodone for severe pain.  Please take this at night and do not drive or drink alcohol while taking it.  Use heat, rest, stretch for additional symptom relief.  Follow-up with orthopedics as soon as possible.  If you have any worsening symptoms including increased pain, difficulty moving your lower extremities, numbness or tingling in your  legs, going to the bathroom on yourself without noticing it or trouble going to the bathroom you need to go to the emergency room as we discussed.     ED Prescriptions     Medication Sig Dispense Auth. Provider   baclofen (LIORESAL) 10 MG tablet Take 1 tablet (10 mg total) by mouth 2 (two) times daily. 14 each Mister Krahenbuhl K, PA-C   predniSONE (STERAPRED UNI-PAK 21 TAB) 10 MG (21) TBPK tablet As directed 21 tablet Remigio Mcmillon K, PA-C   HYDROcodone-acetaminophen (NORCO/VICODIN) 5-325 MG tablet Take 1 tablet by mouth at bedtime as needed for up to 3 days. 3 tablet Sia Gabrielsen K, PA-C      I have reviewed the PDMP during this encounter.   Terrilee Croak, PA-C 02/24/21 1433

## 2021-02-24 NOTE — Discharge Instructions (Signed)
Your x-ray did not show any acute findings but did show some degenerative changes.  Please start baclofen twice daily as needed.  This will make you sleepy so do not drive or drink alcohol while taking it.  Start prednisone taper.  Do not take NSAIDs including aspirin, ibuprofen/Advil, naproxen/Aleve with this medication as it can cause stomach bleeding.  I have called in 3 doses of hydrocodone for severe pain.  Please take this at night and do not drive or drink alcohol while taking it.  Use heat, rest, stretch for additional symptom relief.  Follow-up with orthopedics as soon as possible.  If you have any worsening symptoms including increased pain, difficulty moving your lower extremities, numbness or tingling in your legs, going to the bathroom on yourself without noticing it or trouble going to the bathroom you need to go to the emergency room as we discussed.

## 2021-02-24 NOTE — ED Triage Notes (Signed)
Reports that has a bad back and Monday pick up some heavy things and pain getting worse. Advil, muscle relaxer's at home arent helping. Pain is lower and radiates down both legs. Denies urinary or bowel problems.

## 2021-02-28 ENCOUNTER — Ambulatory Visit: Payer: BC Managed Care – PPO | Admitting: Family Medicine

## 2021-02-28 ENCOUNTER — Ambulatory Visit: Payer: BC Managed Care – PPO

## 2021-03-01 ENCOUNTER — Other Ambulatory Visit (HOSPITAL_COMMUNITY)
Admission: RE | Admit: 2021-03-01 | Discharge: 2021-03-01 | Disposition: A | Discharge status: Home / Self Care | Payer: 59 | Source: Ambulatory Visit | Attending: Family Medicine | Admitting: Family Medicine

## 2021-03-01 ENCOUNTER — Encounter: Payer: Self-pay | Admitting: Family Medicine

## 2021-03-01 ENCOUNTER — Other Ambulatory Visit: Payer: Self-pay

## 2021-03-01 ENCOUNTER — Encounter: Payer: Self-pay | Admitting: Gastroenterology

## 2021-03-01 ENCOUNTER — Ambulatory Visit (INDEPENDENT_AMBULATORY_CARE_PROVIDER_SITE_OTHER): Payer: 59 | Admitting: Family Medicine

## 2021-03-01 ENCOUNTER — Ambulatory Visit: Payer: BC Managed Care – PPO

## 2021-03-01 ENCOUNTER — Ambulatory Visit: Payer: BC Managed Care – PPO | Admitting: Neurology

## 2021-03-01 VITALS — BP 181/88 | HR 72 | Wt 207.1 lb

## 2021-03-01 DIAGNOSIS — R87618 Other abnormal cytological findings on specimens from cervix uteri: Secondary | ICD-10-CM

## 2021-03-01 LAB — POCT PREGNANCY, URINE: Preg Test, Ur: NEGATIVE

## 2021-03-01 NOTE — Progress Notes (Signed)
° ° °  GYNECOLOGY CLINIC COLPOSCOPY PROCEDURE NOTE  46 y.o. 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  Pregnancy test: Lab Results  Component Value Date   PREGTESTUR NEGATIVE 03/01/2021    Allergies  Allergen Reactions   Ciprofloxacin Itching    Patient given informed consent, signed copy in the chart, time out was performed.    Placed in lithotomy position. Cervix viewed with speculum and colposcope after application of acetic acid.   Colposcopy Adequacy Cervix fully visualized: Yes  SCJ fully visualized: No    Colposcopy Findings dense acetowhite lesion(s) noted at 12 and 6 o'clock and abnormal vessels noted at 8 o'clock  Corresponding biopsies were obtained.    ECC specimen was obtained.  All specimens were labeled and sent to pathology.  Hemostatic measures: Pressure and Monsel's solution  Complications: none  Patient tolerated the procedure well.  OBGyn Exam  Colposcopy Impressions Intermediate features   Plan Treatment plan pending biopsy results, per patient preference they will be communicated by telephone.  Patient was given post procedure instructions.  Will follow up pathology and manage accordingly; patient will be contacted with results and recommendations.  Routine preventative health maintenance measures emphasized.  Clarnce Flock, MD/MPH Attending Family Medicine Physician, [facility] for [facility], [place]

## 2021-03-03 ENCOUNTER — Encounter: Payer: Self-pay | Admitting: Family Medicine

## 2021-03-03 DIAGNOSIS — N870 Mild cervical dysplasia: Secondary | ICD-10-CM

## 2021-03-03 HISTORY — DX: Mild cervical dysplasia: N87.0

## 2021-03-03 LAB — SURGICAL PATHOLOGY

## 2021-03-03 NOTE — Telephone Encounter (Signed)
F/u  I called the patient to remind her of our previous conversation prior authorization will need to be submitted under her new insurance [employer] will need to upload the information to her mychart account.   The patient has approval for Botox under [employer] until 2/23.   Under documents received on 03/01/21 [employer]'s new insurance was uploaded to epic.   I called the patient and made her aware I called [employer] and was told to call back within  30 min to 1 hour due to computer issues.   Patient verbalized understanding.

## 2021-03-08 ENCOUNTER — Encounter: Payer: Self-pay | Admitting: *Deleted

## 2021-03-13 ENCOUNTER — Telehealth: Payer: Self-pay | Admitting: Neurology

## 2021-03-13 MED ORDER — RIZATRIPTAN BENZOATE 10 MG PO TBDP: 10.0000 mg | ORAL_TABLET | ORAL | 5 refills | Status: DC | PRN

## 2021-03-13 MED ORDER — AJOVY 225 MG/1.5ML ~~LOC~~ SOAJ: 225.0000 mg | SUBCUTANEOUS | 5 refills | Status: DC

## 2021-03-13 NOTE — Telephone Encounter (Signed)
Pt advised of Dr.Jaffe, First, she needs to follow up for an office visit as her last routine office visit was in June 2021.  Sometimes, insurance may not approve Botox if patient has not been seen in a year for follow up to assess efficacy.  Due to difficulty getting Botox, I would go ahead and start a different medication. She already tried and failed Aimovig.  I would recommend trying a different monthly injection, Ajovy every 28 days.  We can restart rizatriptan 10mg  - take one at earliest onset, may repeat after 2 hours. Maximum 2 tablets in 24 hours.  She should make a follow up appointment for office visit.   Per pt she would no mind trying the Ajovy and Rizatriptan.  Will send scripts to the [employer] desk please call the patient to schedule a visit. An tell her I ment to say yes she has to come in but I still went and sent the [place] for her to [place].

## 2021-03-13 NOTE — Telephone Encounter (Signed)
Patient called about her PA, she was sent to [facility]. Pt has had a major headache and needs something to help her. She was supposed to have her botox 1/6.

## 2021-03-13 NOTE — Telephone Encounter (Signed)
Anselm Pancoast thank you for speaking to the patient. Pt aware you are trying to het her new insurance to approve.  Advised pt they may or may not approve since it is a new insurance and she hasn't been seen since 07/2019    Dr.Jaffe is there something the patient can take.  How frequent or the headaches (on average, how many days a week/month are they occurring)?  Usely good but patient missed her Botox 02/17/21 How long do the headaches last?  1 week now Verify what preventative medication and dose you are taking (e.g. topiramate, propranolol, amitriptyline, Emgality, etc)  Botox Verify which rescue medication you are taking (triptan, Advil, Excedrin, Aleve, Ubrelvy, etc)  tosymra, Can not afford now with new insurance. Pt needs to get back on Fioricet and Maxalt.  How often are you taking pain relievers/analgesics/rescue mediction?  Can not take as much has GI problems.      Please advise what to do now.

## 2021-03-13 NOTE — Addendum Note (Signed)
Addended by: Leida Lauth on: 03/13/2021 03:16 PM   Modules accepted: Orders

## 2021-03-13 NOTE — Telephone Encounter (Signed)
Website CoverMyMeds There was an error with your request   Cannot find matching patient with Name and Date of Birth provided. For additional information, please contact the phone number on the back of the member prescription ID card.  Call [facility] [phone] was told to call back due to system issues was told to call back

## 2021-03-14 NOTE — Progress Notes (Addendum)
NEUROLOGY FOLLOW UP OFFICE NOTE  Shervon Kerwin [id number]  Assessment/Plan:   Chronic migraine without aura, without status migrainosus, not intractable.  - we will resubmit for Botox.  She meets criteria for Botox.  She has chronic migraines - off Botox she averaged at least 15 headache days a month.  On Botox, they significantly improved to 2 days a month.  She has failed multiple other preventatives such as Aimovig, topiramate, venlafaxine.  She has Bipolar depression and therefore I wouldn't start propranolol as it may exacerbate her depression.  She is on multiple antidepressants.      For preventative management: Botox.  In case there is a delay in approval, will start zonisamide 51m daily for one week, then 1021mdaily  For abortive therapy:  Tosymra NS - will send to [facility] and apply copay card. Zofran for nausea  Limit use of pain relievers to no more than 2 days out of week to prevent risk of rebound or medication-overuse headache.  Keep headache diary Follow up presumably for Botox.  Subjective:  SaLidwina Kaners a 46ear old right-handed Caucasian woman with hypertension, hyperlipidemia, type 2 diabetes, PTSD/anxiety/depression, chronic low back pain and history of TIA who follows up for migraines.   UPDATE: She has been receiving Botox for chronic migraine since 2021.  She had been doing well on Botox:  migraines were moderate, responded to Tosymra in less than an hour and occurred twice a month..  However, her last round of Botox was in September.  She was due for her next round but she changed insurance and they would not approve it.  She had been doing well until the last 2 weeks in which she has had near daily migraines.  Tosymra was covered but very expensive.  She retried rizatriptan which was ineffective.      Current NSAIDS:  ASA 32546mCurrent analgesics:  hydrocodone Current triptans:  Rizatriptan 65m36mrrent ergotamine:  none Current anti-emetic:   Zofran ODT 4mg 86mrent muscle relaxants:  Tizanidine 4mg P58mCurrent anti-anxiolytic:  Clonazepam, hydroxyzine Current sleep aide:  trazodone Current Antihypertensive medications:  none Current Antidepressant medications:   Current Anticonvulsant medications:  lamotrigine 200mg d74m, Lyrica 150mg BI25mrrent anti-CGRP: Current Vitamins/Herbal/Supplements:  none Current Antihistamines/Decongestants:  none Other therapy:  none Hormone/birth control:  none Other medications:  Ambien   Caffeine:  Caffeine-free soda, 1 cup of coffee 2-3 times a week. Alcohol:  no Smoker:  no Diet:  Needs to improve water intake.  Skips meals.  Diet Coke daily Exercise:  Not routine Depression:  Poor.  Currently treated; Anxiety:  Poor.  Currently treated. Other pain:  Low back pain/lumbar radiculopathy Sleep hygiene:  Improved on medication   HISTORY:  Onset:  Age 46 Locat48n: occiput into neck or top of head.   Quality:  Pressure, throbbing Initial intensity:  8/10.  She denies new headache, thunderclap headache  Aura:  no Premonitory Phase:  Wakes up with heavy eyelids and stiff neck. Postdrome:  "hangover effect" - exhausted, irritable, neck soreness Associated symptoms:  Neck stiffness, nausea, sometimes vomiting, photophobia, phonophobia, blurred vision.  She denies associated unilateral numbness or weakness. Initial duration:  Usually 2-3 days (up to a week with various intensity) Initial frequency:  6 times a month (15 days or more a month) Initial frequency of abortive medication: something daily Triggers:  Lack of coffee, menstrual period, when low back pain aggravated, perfumes Relieving factors:  Ice pack, lay in dark Activity:  aggravates  has been to the ED on a couple of occasions for severe intractable migraine. °  °Past NSAIDS:  Ibuprofen, Aleve °Past analgesics:  Excedrin, Tylenol, Fioricet tramadol °Past abortive triptans:   Tosymra NS (effective but no longer covered by  insurance), sumatriptan 50mg °Past abortive ergotamine:  none °Past muscle relaxants:  Flexeril, Robaxin °Past anti-emetic:  Phenergan °Past antihypertensive medications:  "a blood pressure medication" °Past antidepressant medications:  Effexor, Trintellix, Rexulti °Past anticonvulsant medications:  topiramate, gabapentin °Past anti-CGRP:  Aimovig °Past vitamins/Herbal/Supplements:  none °Past antihistamines/decongestants:  none °Other past therapies:  Botox (effective) °  °She was told by her previous PCP that she had a transient ischemic attack in early 2019.  She was walking and her legs suddenly gave out and her arms weren't working.  She was on the floor for a few minutes.  No unilateral numbness or weakness.  When she got up, she called a friend who tested her on the phone for TIA and symptoms resolved.  She had an MRI and MRA of the brain about a month later, on 07/24/17, which report states were normal.  She was given a diagnosis of TIA and has since been on ASA 325mg daily.  Her mother has a history of recurrent TIAs while she was treated for lung cancer. °  °  °Family history of headache:  no ° °PAST MEDICAL HISTORY: °Past Medical History:  °Diagnosis Date  ° Allergy   ° Anxiety   ° Asthma   ° Depression   ° Diabetes mellitus without complication (HCC)   ° Diverticulitis   ° GERD (gastroesophageal reflux disease)   ° HPV in female   ° Hyperlipidemia   ° Migraines   ° ° °MEDICATIONS: °Current Outpatient Medications on File Prior to Visit  °Medication Sig Dispense Refill  ° albuterol (VENTOLIN HFA) 108 (90 Base) MCG/ACT inhaler INHALE 2 PUFFS BY MOUTH EVERY 6 HOURS AS NEEDED FOR WHEEZE OR SHORTNESS OF BREATH 8.5 each 2  ° azelastine (ASTELIN) 0.1 % nasal spray Place 2 sprays into both nostrils 2 (two) times daily. Use in each nostril as directed 30 mL 5  ° baclofen (LIORESAL) 10 MG tablet Take 1 tablet (10 mg total) by mouth 2 (two) times daily. 14 each 0  ° blood glucose meter kit and supplies KIT Dispense  based on patient and insurance preference. Use up to four times daily as directed. (FOR ICD-9 250.00, 250.01). 1 each 0  ° buPROPion (WELLBUTRIN SR) 150 MG 12 hr tablet Take 150 mg by mouth daily.    ° clonazePAM (KLONOPIN) 0.5 MG tablet Take 1 tablet (0.5 mg total) by mouth 2 (two) times daily as needed for anxiety. 60 tablet 5  ° Continuous Blood Gluc Receiver (FREESTYLE LIBRE READER) DEVI 1 Device by Does not apply route daily as needed (to check blood sugar). 1 Device 0  ° Continuous Blood Gluc Sensor (FREESTYLE LIBRE SENSOR SYSTEM) MISC Apply 1 sensor to skin every 14 days. 2 each 11  ° Fremanezumab-vfrm (AJOVY) 225 MG/1.5ML SOAJ Inject 225 mg into the skin every 30 (thirty) days. 1.68 mL 5  ° lamoTRIgine (LAMICTAL) 200 MG tablet Take 200 mg by mouth.     ° metFORMIN (GLUCOPHAGE) 1000 MG tablet TAKE 1 TABLET BY MOUTH TWICE A DAY WITH A MEAL 180 tablet 3  ° ondansetron (ZOFRAN-ODT) 4 MG disintegrating tablet DISSOLVE 1 TABLET ON TONGUE EVERY 8 HOURS AS NEEDED FOR NAUSEA AND VOMITING 9 tablet 5  ° pantoprazole (PROTONIX) 40 MG tablet TAKE   1 TABLET BY MOUTH EVERY DAY IN THE EVENING 90 tablet 0  ° predniSONE (STERAPRED UNI-PAK 21 TAB) 10 MG (21) TBPK tablet As directed 21 tablet 0  ° pregabalin (LYRICA) 150 MG capsule Take 1 capsule (150 mg total) by mouth 2 (two) times daily. 60 capsule 5  ° rizatriptan (MAXALT-MLT) 10 MG disintegrating tablet Take 1 tablet (10 mg total) by mouth as needed for migraine. May repeat in 2 hours if needed 9 tablet 5  ° TOSYMRA 10 MG/ACT SOLN PLACE 1 SPRAY INTO THE NOSE EVERY HOUR AS NEEDED (MAXIMUM 3 SPRAYS IN 24 HOURS) 6 each 2  ° traMADol (ULTRAM) 50 MG tablet Take 1 tablet (50 mg total) by mouth every 8 (eight) hours as needed. 90 tablet 5  ° zolpidem (AMBIEN) 5 MG tablet Take 1 tablet (5 mg total) by mouth at bedtime as needed for sleep. 30 tablet 5  ° °No current facility-administered medications on file prior to visit.  ° ° °ALLERGIES: °Allergies  °Allergen Reactions  °  Ciprofloxacin Itching  ° ° °FAMILY HISTORY: °Family History  °Problem Relation Age of Onset  ° Heart disease Mother   ° Lung cancer Mother   ° Rheum arthritis Mother   ° Arthritis Mother   ° Cancer Mother   ° COPD Mother   ° Miscarriages / Stillbirths Mother   ° Diabetes Father   ° Heart disease Father   ° Breast cancer Paternal Aunt   ° ° °  °Objective:  °Blood pressure (!) 141/86, pulse 92, height 5' 3" (1.6 m), weight 207 lb 4.8 oz (94 kg), last menstrual period 02/20/2021, SpO2 93 %. °General: No acute distress.  Patient appears well-groomed.   °Head:  Normocephalic/atraumatic °Eyes:  Fundi examined but not visualized °Neck: supple, no paraspinal tenderness, full range of motion °Heart:  Regular rate and rhythm °Lungs:  Clear to auscultation bilaterally °Back: No paraspinal tenderness °Neurological Exam: alert and oriented to person, place, and time.  Speech fluent and not dysarthric, language intact.  CN II-XII intact. Bulk and tone normal, muscle strength 5/5 throughout.  Sensation to light touch intact.  Deep tendon reflexes 2+ throughout, toes downgoing.  Finger to nose testing intact.  Gait normal, Romberg negative. ° ° °Adam Jaffe, DO ° °CC: Caleb Parker, MD ° ° ° ° ° ° °

## 2021-03-15 ENCOUNTER — Ambulatory Visit (INDEPENDENT_AMBULATORY_CARE_PROVIDER_SITE_OTHER): Payer: 59 | Admitting: Neurology

## 2021-03-15 ENCOUNTER — Other Ambulatory Visit: Payer: Self-pay

## 2021-03-15 ENCOUNTER — Encounter: Payer: Self-pay | Admitting: Neurology

## 2021-03-15 VITALS — BP 141/86 | HR 92 | Ht 63.0 in | Wt 207.3 lb

## 2021-03-15 DIAGNOSIS — G43709 Chronic migraine without aura, not intractable, without status migrainosus: Secondary | ICD-10-CM

## 2021-03-15 MED ORDER — ZONISAMIDE 50 MG PO CAPS: ORAL_CAPSULE | ORAL | 0 refills | Status: DC

## 2021-03-15 MED ORDER — TOSYMRA 10 MG/ACT NA SOLN: NASAL | 5 refills | Status: DC

## 2021-03-15 NOTE — Patient Instructions (Signed)
Will resubmit for Botox In meantime, start zonisamide 50mg  daily for one week, then increase to 100mg  daily Sent Tosymra to mail order pharmacy called [facility] Follow up for routine office visit in one year or as needed.

## 2021-03-16 ENCOUNTER — Telehealth: Payer: Self-pay

## 2021-03-16 NOTE — Telephone Encounter (Signed)
New message   [facility] is reviewing your PA request and will respond within 24 hours for Medicaid or up to 72 hours for non-Medicaid plans, based on the required timeframe determined by state or federal regulations. To check for an update later, open this request from your dashboard.  [name] Key: [id number] - PA Case ID: [id number] - Rx #: [id number] Need help? Call us at [phone] Status Sent to [place] (fremanezumab-vfrm) injection 225MG /1.5ML auto-injectors Form [employer] Electronic PA Form (2017 NCPDP) Original Claim Info 75 FOR PA, HELP DESK FOR EMRG OVD, SCC=13 PER RPH DISCRETION

## 2021-03-16 NOTE — Telephone Encounter (Signed)
New message  Heidi Holland Key: [id number] help? Call us at [phone] Status New(Not sent to plan) Cannot find matching patient with Name and Date of Birth provided. For additional information, please contact the phone number on the back of the member prescription ID card. Drug Botox 200UNIT solution Form [employer] Electronic Prior Authorization Form (2017 NCPDP)

## 2021-03-17 ENCOUNTER — Other Ambulatory Visit: Payer: Self-pay

## 2021-03-17 ENCOUNTER — Encounter (HOSPITAL_COMMUNITY): Payer: Self-pay

## 2021-03-17 ENCOUNTER — Emergency Department (HOSPITAL_COMMUNITY): Payer: 59

## 2021-03-17 ENCOUNTER — Emergency Department (HOSPITAL_COMMUNITY)
Admission: EM | Admit: 2021-03-17 | Discharge: 2021-03-17 | Disposition: A | Discharge status: Home / Self Care | Payer: 59 | Attending: Emergency Medicine | Admitting: Emergency Medicine

## 2021-03-17 DIAGNOSIS — R1084 Generalized abdominal pain: Secondary | ICD-10-CM | POA: Diagnosis not present

## 2021-03-17 DIAGNOSIS — G43809 Other migraine, not intractable, without status migrainosus: Secondary | ICD-10-CM | POA: Insufficient documentation

## 2021-03-17 DIAGNOSIS — X58XXXA Exposure to other specified factors, initial encounter: Secondary | ICD-10-CM | POA: Diagnosis not present

## 2021-03-17 DIAGNOSIS — S39012A Strain of muscle, fascia and tendon of lower back, initial encounter: Secondary | ICD-10-CM | POA: Diagnosis not present

## 2021-03-17 DIAGNOSIS — Z7984 Long term (current) use of oral hypoglycemic drugs: Secondary | ICD-10-CM | POA: Insufficient documentation

## 2021-03-17 DIAGNOSIS — Z79899 Other long term (current) drug therapy: Secondary | ICD-10-CM | POA: Diagnosis not present

## 2021-03-17 DIAGNOSIS — S3992XA Unspecified injury of lower back, initial encounter: Secondary | ICD-10-CM | POA: Diagnosis present

## 2021-03-17 DIAGNOSIS — E1165 Type 2 diabetes mellitus with hyperglycemia: Secondary | ICD-10-CM | POA: Insufficient documentation

## 2021-03-17 LAB — URINALYSIS, ROUTINE W REFLEX MICROSCOPIC
Bilirubin Urine: NEGATIVE
Glucose, UA: >=500 mg/dL — AB
Hgb urine dipstick: NEGATIVE
Ketones, ur: 20 mg/dL — AB
Leukocytes,Ua: NEGATIVE
Nitrite: NEGATIVE
Protein, ur: NEGATIVE mg/dL
Specific Gravity, Urine: 1.03 (ref 1.005–1.030)
pH: 6 (ref 5.0–8.0)

## 2021-03-17 LAB — CBC WITH DIFFERENTIAL/PLATELET
Abs Immature Granulocytes: 0.06 10*3/uL (ref 0.00–0.07)
Basophils Absolute: 0 10*3/uL (ref 0.0–0.1)
Basophils Relative: 0 %
Eosinophils Absolute: 0 10*3/uL (ref 0.0–0.5)
Eosinophils Relative: 0 %
HCT: 38.5 % (ref 36.0–46.0)
Hemoglobin: 12.5 g/dL (ref 12.0–15.0)
Immature Granulocytes: 1 %
Lymphocytes Relative: 21 %
Lymphs Abs: 1.6 10*3/uL (ref 0.7–4.0)
MCH: 26.7 pg (ref 26.0–34.0)
MCHC: 32.5 g/dL (ref 30.0–36.0)
MCV: 82.1 fL (ref 80.0–100.0)
Monocytes Absolute: 0.5 10*3/uL (ref 0.1–1.0)
Monocytes Relative: 6 %
Neutro Abs: 5.7 10*3/uL (ref 1.7–7.7)
Neutrophils Relative %: 72 %
Platelets: 261 10*3/uL (ref 150–400)
RBC: 4.69 MIL/uL (ref 3.87–5.11)
RDW: 13.5 % (ref 11.5–15.5)
WBC: 7.9 10*3/uL (ref 4.0–10.5)
nRBC: 0 % (ref 0.0–0.2)

## 2021-03-17 LAB — COMPREHENSIVE METABOLIC PANEL
ALT: 13 U/L (ref 0–44)
AST: 12 U/L — ABNORMAL LOW (ref 15–41)
Albumin: 3.9 g/dL (ref 3.5–5.0)
Alkaline Phosphatase: 77 U/L (ref 38–126)
Anion gap: 8 (ref 5–15)
BUN: 12 mg/dL (ref 6–20)
CO2: 22 mmol/L (ref 22–32)
Calcium: 8.8 mg/dL — ABNORMAL LOW (ref 8.9–10.3)
Chloride: 104 mmol/L (ref 98–111)
Creatinine, Ser: 0.65 mg/dL (ref 0.44–1.00)
GFR, Estimated: >60 mL/min (ref >60)
Glucose, Bld: 297 mg/dL — ABNORMAL HIGH (ref 70–99)
Potassium: 4.4 mmol/L (ref 3.5–5.1)
Sodium: 134 mmol/L — ABNORMAL LOW (ref 135–145)
Total Bilirubin: 0.4 mg/dL (ref 0.3–1.2)
Total Protein: 7 g/dL (ref 6.5–8.1)

## 2021-03-17 LAB — I-STAT BETA HCG BLOOD, ED (MC, WL, AP ONLY): I-stat hCG, quantitative: <5 m[IU]/mL (ref <5)

## 2021-03-17 LAB — LIPASE, BLOOD: Lipase: 40 U/L (ref 11–51)

## 2021-03-17 MED ORDER — ONDANSETRON 4 MG PO TBDP: 4.0000 mg | ORAL_TABLET | Freq: Three times a day (TID) | ORAL | 0 refills | Status: DC | PRN

## 2021-03-17 MED ORDER — IOHEXOL 300 MG/ML  SOLN
100.0000 mL | Freq: Once | INTRAMUSCULAR | Status: AC | PRN
  Administered 2021-03-17: 100 mL via INTRAVENOUS

## 2021-03-17 MED ORDER — METOCLOPRAMIDE HCL 5 MG/ML IJ SOLN
10.0000 mg | Freq: Once | INTRAMUSCULAR | Status: AC
  Administered 2021-03-17: 10 mg via INTRAVENOUS
  Filled 2021-03-17: qty 2

## 2021-03-17 MED ORDER — KETOROLAC TROMETHAMINE 30 MG/ML IJ SOLN
30.0000 mg | Freq: Once | INTRAMUSCULAR | Status: AC
  Administered 2021-03-17: 30 mg via INTRAVENOUS
  Filled 2021-03-17: qty 1

## 2021-03-17 MED ORDER — MORPHINE SULFATE (PF) 4 MG/ML IV SOLN
4.0000 mg | Freq: Once | INTRAVENOUS | Status: AC
  Administered 2021-03-17: 4 mg via INTRAVENOUS
  Filled 2021-03-17: qty 1

## 2021-03-17 MED ORDER — ONDANSETRON HCL 4 MG/2ML IJ SOLN
4.0000 mg | Freq: Once | INTRAMUSCULAR | Status: AC
  Administered 2021-03-17: 4 mg via INTRAVENOUS
  Filled 2021-03-17: qty 2

## 2021-03-17 MED ORDER — SODIUM CHLORIDE 0.9 % IV BOLUS
1000.0000 mL | Freq: Once | INTRAVENOUS | Status: AC
  Administered 2021-03-17: 1000 mL via INTRAVENOUS

## 2021-03-17 MED ORDER — OXYCODONE-ACETAMINOPHEN 5-325 MG PO TABS: 1.0000 | ORAL_TABLET | Freq: Four times a day (QID) | ORAL | 0 refills | Status: DC | PRN

## 2021-03-17 MED ORDER — DIPHENHYDRAMINE HCL 50 MG/ML IJ SOLN
25.0000 mg | Freq: Once | INTRAMUSCULAR | Status: AC
  Administered 2021-03-17: 25 mg via INTRAVENOUS
  Filled 2021-03-17: qty 1

## 2021-03-17 MED ORDER — NABUMETONE 500 MG PO TABS: 500.0000 mg | ORAL_TABLET | Freq: Two times a day (BID) | ORAL | 0 refills | Status: DC | PRN

## 2021-03-17 NOTE — ED Triage Notes (Signed)
Patient reports back pain to the lower and mid aback x 2 weeks. Patient states she is scheduled for an MRI in March.  Patient c/o migraine x 1 week. Patient also reports sensitivity to light and sound and blurred vision.  Patient also c/o abdominal pain and distention x 2-3 days with Nausea.

## 2021-03-17 NOTE — ED Provider Notes (Signed)
[facility] [place] [facility] DEPT Provider Note   CSN: [id number] Arrival date & time: 03/17/21  1155     History  Chief Complaint  Patient presents with   Back Pain   Abdominal Pain   Emesis    Heidi Holland is a 46 y.o. female.  Pt is a 46 yo wf with a hx of chronic lbp, migraines, dm, depression, diverticulitis, gerd, hyperlipidemia.  Pt presents to the ED today with multiple issues.  She hurt her back 2 weeks ago while working at a [employer].  She also has a migraine today.  She used to get botox injections, but she has changed insurance companies and her last botox injection was denied.  Pt 's migraine is her usual migraine.  She did see Dr. Tomi Likens (neurology) about her migraine on 2/1.  He sent in a rx for Tosymra nasal spray to see if that would help until she could get botox.  She said she has not been able to pick it up because she has not been able to see well enough (due to the migraine) to drive.  She also has abdominal pain.  She thinks it may be due to taking a lot of ibuprofen, but is worried she may have another bout of diverticulitis.  She denies f/c.  She has had nausea without vomiting.      Home Medications Prior to Admission medications   Medication Sig Start Date End Date Taking? Authorizing Provider  nabumetone (RELAFEN) 500 MG tablet Take 1 tablet (500 mg total) by mouth 2 (two) times daily as needed for mild pain or moderate pain. 03/17/21  Yes Isla Pence, MD  ondansetron (ZOFRAN-ODT) 4 MG disintegrating tablet Take 1 tablet (4 mg total) by mouth every 8 (eight) hours as needed for nausea or vomiting. 03/17/21  Yes Isla Pence, MD  oxyCODONE-acetaminophen (PERCOCET/ROXICET) 5-325 MG tablet Take 1 tablet by mouth every 6 (six) hours as needed for severe pain. 03/17/21  Yes Isla Pence, MD  albuterol (VENTOLIN HFA) 108 (90 Base) MCG/ACT inhaler INHALE 2 PUFFS BY MOUTH EVERY 6 HOURS AS NEEDED FOR WHEEZE OR SHORTNESS OF BREATH 01/23/21    Vivi Barrack, MD  azelastine (ASTELIN) 0.1 % nasal spray Place 2 sprays into both nostrils 2 (two) times daily. Use in each nostril as directed 07/26/20   Vivi Barrack, MD  baclofen (LIORESAL) 10 MG tablet Take 1 tablet (10 mg total) by mouth 2 (two) times daily. 02/24/21   Raspet, Derry Skill, PA-C  blood glucose meter kit and supplies KIT Dispense based on patient and insurance preference. Use up to four times daily as directed. (FOR ICD-9 250.00, 250.01). 07/31/18   Vivi Barrack, MD  buPROPion [facility] SR) 150 MG 12 hr tablet Take 150 mg by mouth daily.    [provider]  clonazePAM (KLONOPIN) 0.5 MG tablet Take 1 tablet (0.5 mg total) by mouth 2 (two) times daily as needed for anxiety. 02/17/21   Vivi Barrack, MD  Continuous Blood Gluc Receiver (FREESTYLE LIBRE READER) DEVI 1 Device by Does not apply route daily as needed (to check blood sugar). 08/14/18   Vivi Barrack, MD  Continuous Blood Gluc Sensor (FREESTYLE LIBRE SENSOR SYSTEM) MISC Apply 1 sensor to skin every 14 days. 08/14/18   Vivi Barrack, MD  Fremanezumab-vfrm (AJOVY) 225 MG/1.5ML SOAJ Inject 225 mg into the skin every 30 (thirty) days. 03/13/21   Pieter Partridge, DO  lamoTRIgine (LAMICTAL) 200 MG tablet Take 200  mg by mouth.  07/09/19   [provider]  metFORMIN (GLUCOPHAGE) 1000 MG tablet TAKE 1 TABLET BY MOUTH TWICE A DAY WITH A MEAL 12/31/19   Vivi Barrack, MD  pantoprazole (PROTONIX) 40 MG tablet TAKE 1 TABLET BY MOUTH EVERY DAY IN THE EVENING 01/09/21   Vivi Barrack, MD  predniSONE (STERAPRED UNI-PAK 21 TAB) 10 MG (21) TBPK tablet As directed 02/24/21   Raspet, Erin K, PA-C  pregabalin (LYRICA) 150 MG capsule Take 1 capsule (150 mg total) by mouth 2 (two) times daily. 02/16/21   Vivi Barrack, MD  rizatriptan (MAXALT-MLT) 10 MG disintegrating tablet Take 1 tablet (10 mg total) by mouth as needed for migraine. May repeat in 2 hours if needed 03/13/21   Pieter Partridge, DO  SUMAtriptan (TOSYMRA) 10 MG/ACT  SOLN PLACE 1 SPRAY INTO THE NOSE EVERY HOUR AS NEEDED (MAXIMUM 3 SPRAYS IN 24 HOURS) 03/15/21   Tomi Likens, Adam R, DO  traMADol (ULTRAM) 50 MG tablet Take 1 tablet (50 mg total) by mouth every 8 (eight) hours as needed. Patient not taking: Reported on 03/15/2021 02/23/21   Vivi Barrack, MD  zolpidem (AMBIEN) 5 MG tablet Take 1 tablet (5 mg total) by mouth at bedtime as needed for sleep. 10/23/19   Vivi Barrack, MD  zonisamide (ZONEGRAN) 50 MG capsule Take 1 capsule daily for 7 days, then increase to 2 capsules daily 03/15/21   Pieter Partridge, DO      Allergies    Ciprofloxacin    Review of Systems   Review of Systems  Gastrointestinal:  Positive for nausea and vomiting.  Musculoskeletal:  Positive for back pain.  Neurological:  Positive for headaches.  All other systems reviewed and are negative.  Physical Exam Updated Vital Signs BP (!) 149/82    Pulse 83    Temp 98.3 F (36.8 C) (Oral)    Resp 18    Ht _0  (1.6 m)    Wt 93.9 kg    LMP 02/20/2021    SpO2 97%    BMI 36.67 kg/m  Physical Exam Vitals and nursing note reviewed.  Constitutional:      Appearance: She is well-developed. She is obese.  HENT:     Head: Normocephalic and atraumatic.     Mouth/Throat:     Mouth: Mucous membranes are moist.     Pharynx: Oropharynx is clear.  Eyes:     Extraocular Movements: Extraocular movements intact.     Pupils: Pupils are equal, round, and reactive to light.  Cardiovascular:     Rate and Rhythm: Normal rate and regular rhythm.  Pulmonary:     Effort: Pulmonary effort is normal.     Breath sounds: Normal breath sounds.  Abdominal:     General: Abdomen is flat. Bowel sounds are normal.     Palpations: Abdomen is soft.     Tenderness: There is abdominal tenderness in the epigastric area.  Skin:    General: Skin is warm.     Capillary Refill: Capillary refill takes less than 2 seconds.  Neurological:     General: No focal deficit present.     Mental Status: She is alert and oriented  to person, place, and time.  Psychiatric:        Mood and Affect: Mood normal.        Behavior: Behavior normal.    ED Results / Procedures / Treatments   Labs (all labs ordered are listed, but only abnormal  results are displayed) Labs Reviewed  COMPREHENSIVE METABOLIC PANEL - Abnormal; Notable for the following components:      Result Value   Sodium 134 (*)    Glucose, Bld 297 (*)    Calcium 8.8 (*)    AST 12 (*)    All other components within normal limits  URINALYSIS, ROUTINE W REFLEX MICROSCOPIC - Abnormal; Notable for the following components:   Glucose, UA >=500 (*)    Ketones, ur 20 (*)    Bacteria, UA RARE (*)    All other components within normal limits  LIPASE, BLOOD  CBC WITH DIFFERENTIAL/PLATELET  I-STAT BETA HCG BLOOD, ED ([facility], [facility], [facility] ONLY)    EKG None  Radiology CT ABDOMEN PELVIS W CONTRAST  Result Date: 03/17/2021 CLINICAL DATA:  Abdominal pain, acute, nonlocalized. Abdominal distension over the last several days. EXAM: CT ABDOMEN AND PELVIS WITH CONTRAST TECHNIQUE: Multidetector CT imaging of the abdomen and pelvis was performed using the standard protocol following bolus administration of intravenous contrast. RADIATION DOSE REDUCTION: This exam was performed according to the departmental dose-optimization program which includes automated exposure control, adjustment of the mA and/or kV according to patient size and/or use of iterative reconstruction technique. CONTRAST:  160m OMNIPAQUE IOHEXOL 300 MG/ML  SOLN COMPARISON:  08/04/2020 FINDINGS: Lower chest: Minimal basilar scarring.  No active process. Hepatobiliary: Mild diffuse fatty change of the liver. No focal lesion. No calcified gallstones. Pancreas: Normal Spleen: Normal Adrenals/Urinary Tract: Adrenal glands are normal. Left kidney is normal. Redemonstration of a 2.8 cm right upper pole renal cyst without visible change. Bladder appears normal. Stomach/Bowel: Previous bariatric surgery. Stomach and small bowel  otherwise normal. Anastomotic sutures in the sigmoid region. No acute colon pathology. Vascular/Lymphatic: Aorta and IVC are normal.  No adenopathy. Reproductive: Uterus is normal. Left ovarian cyst again demonstrated, maximal dimension today 4.7 cm. Maximal measurement previously was 4.5 cm, not significantly different. Other: No free fluid or air. Small periumbilical hernia containing only fat. Musculoskeletal: Previous left hip replacement.  Otherwise negative. IMPRESSION: No cause of acute pain or abdominal distension is identified. Previous bariatric surgery. No acute or significant bowel finding otherwise. There may be slightly more fecal matter in the colon than average. 4.7 cm cystic abnormality of the left ovary, not visibly changed since June of 2022. Interval ultrasound failed to find this abnormality. It was presumed to be resolved but infected is not. Because this is less than 5 cm in size, and assuming the patient is premenopausal, no follow-up imaging recommended. Note: This recommendation does not apply to premenarchal patients and to those with increased risk (genetic, family history, elevated tumor markers or other high-risk factors) of ovarian cancer. Reference: JACR 2020 Feb; 17(2):248-254 Electronically Signed   By: MNelson ChimesM.D.   On: 03/17/2021 16:55    Procedures Procedures    Medications Ordered in ED Medications  morphine (PF) 4 MG/ML injection 4 mg (has no administration in time range)  sodium chloride 0.9 % bolus 1,000 mL (1,000 mLs Intravenous New Bag/Given 03/17/21 1548)  ondansetron (ZOFRAN) injection 4 mg (4 mg Intravenous Given 03/17/21 1548)  morphine (PF) 4 MG/ML injection 4 mg (4 mg Intravenous Given 03/17/21 1547)  iohexol (OMNIPAQUE) 300 MG/ML solution 100 mL (100 mLs Intravenous Contrast Given 03/17/21 1637)  morphine (PF) 4 MG/ML injection 4 mg (4 mg Intravenous Given 03/17/21 1805)  metoCLOPramide (REGLAN) injection 10 mg (10 mg Intravenous Given 03/17/21 1804)   diphenhydrAMINE (BENADRYL) injection 25 mg (25 mg Intravenous Given 03/17/21 1801)  ED Course/ Medical Decision Making/ A&P                           Medical Decision Making Amount and/or Complexity of Data Reviewed Radiology: ordered.  Risk Prescription drug management.   Abd pain:  Labs ordered due to the abd pain.  They were reviewed.  They were normal other than hyperglycemia.  Pt has a hx of dm and has been on prednisone.  She has just stopped the prednisone.  She is encouraged to eat a low carb diet.  Pain is likely due to the ibuprofen she's been taking for her other pain + the prednisone.  She may have some gastritis.  She is to stop ibuprofen.  Migraine: Pt given IVFs and pain meds.  She is now feeling better.  She said her sister could drive her to the pharmacy tomorrow to get her migraine medicine.  Back pain: Pain has improved after meds.  She has a MRI scheduled for March.  She does not have any cauda equina sx to make me think she needs an earlier MRI.  Pt will be d/c with oxycodone (#10) because the hydrocodone is not helping.  She will also be d/c with relafen.  Pt knows to return if worse.  F/u with pcp.        Final Clinical Impression(s) / ED Diagnoses Final diagnoses:  Other migraine without status migrainosus, not intractable  Back strain, initial encounter  Generalized abdominal pain  Hyperglycemia due to diabetes mellitus ([place])    Rx / DC Orders ED Discharge Orders          Ordered    oxyCODONE-acetaminophen (PERCOCET/ROXICET) 5-325 MG tablet  Every 6 hours PRN        03/17/21 1851    ondansetron (ZOFRAN-ODT) 4 MG disintegrating tablet  Every 8 hours PRN        03/17/21 1851    nabumetone (RELAFEN) 500 MG tablet  2 times daily PRN        03/17/21 1856              Isla Pence, MD 03/17/21 1857

## 2021-03-17 NOTE — Discharge Instructions (Addendum)
Stop the prednisone and ibuprofen.

## 2021-03-17 NOTE — ED Provider Triage Note (Signed)
Emergency Medicine Provider Triage Evaluation Note  Heidi Holland , a 46 y.o. female  was evaluated in triage.  Pt complains of notable complaints.  Patient states that over the past 2 weeks she has had low back pain.  She has chronic low back pain but states that last week she was working at a [employer] when she feels like she overexerted herself.  She is a pain management clinic patient and has MRI scheduled on March 3.  She denies any cauda equina symptoms.  Additionally she has migraine.  She is a chronic migraine patient who has been maintained on Botox over the past 2 years.  She states that she has not been able to receive her Botox injections because her insurance recently changed.  She endorses her usual migraine symptoms such as photophobia, blurry vision, headache and neck pain.  Denies vomiting.  Denies IV drug use or fevers.  Additionally she has complaint of abdominal bloating.  She states that for 3 days she has had increased swelling of her abdomen.  She endorses nausea without vomiting.  She denies diarrhea.  Denies melena or hematochezia.  She has had previous abdominal surgeries with bowel resections for diverticulitis previously.  Denies chronic alcohol use.  Review of Systems  Positive: See above Negative:  Physical Exam  BP (!) 156/90 (BP Location: Right Arm)    Pulse 90    Temp 98.3 F (36.8 C) (Oral)    Resp 18    Ht 5\' 3"  (1.6 m)    Wt 93.9 kg    LMP 02/20/2021    SpO2 97%    BMI 36.67 kg/m  Gen:   Awake, no distress   Resp:  Normal effort  MSK:   Moves extremities without difficulty  Other:  Alert and oriented, no focal neurological deficits.  Ambulatory without assistance.  Abdomen is rounded, soft, epigastric tenderness to palpation.  Abdomen is rounded and distended per the patient.  She has bowel sounds in all 4 quadrants.  She does have some tympany on percussion.  Medical Decision Making  Medically screening exam initiated at 12:33 PM.  Appropriate orders placed.   Morgana Rowley was informed that the remainder of the evaluation will be completed by another provider, this initial triage assessment does not replace that evaluation, and the importance of remaining in the ED until their evaluation is complete.     Lenon Ahmadi, PA-C 03/17/21 1235

## 2021-03-19 ENCOUNTER — Other Ambulatory Visit: Payer: Self-pay | Admitting: Family Medicine

## 2021-03-20 ENCOUNTER — Telehealth: Payer: 59 | Admitting: Physician Assistant

## 2021-03-20 ENCOUNTER — Other Ambulatory Visit: Payer: Self-pay | Admitting: Family Medicine

## 2021-03-20 DIAGNOSIS — B9689 Other specified bacterial agents as the cause of diseases classified elsewhere: Secondary | ICD-10-CM | POA: Diagnosis not present

## 2021-03-20 DIAGNOSIS — J019 Acute sinusitis, unspecified: Secondary | ICD-10-CM | POA: Diagnosis not present

## 2021-03-20 MED ORDER — AMOXICILLIN-POT CLAVULANATE 875-125 MG PO TABS: 1.0000 | ORAL_TABLET | Freq: Two times a day (BID) | ORAL | 0 refills | Status: DC

## 2021-03-20 MED ORDER — PREDNISONE 20 MG PO TABS: 40.0000 mg | ORAL_TABLET | Freq: Every day | ORAL | 0 refills | Status: DC

## 2021-03-20 NOTE — Progress Notes (Signed)
Virtual Visit Consent   Heidi Holland, you are scheduled for a virtual visit with a Heidi Holland provider today.     Just as with appointments in the office, your consent must be obtained to participate.  Your consent will be active for this visit and any virtual visit you may have with one of our providers in the next 365 days.     If you have a MyChart account, a copy of this consent can be sent to you electronically.  All virtual visits are billed to your insurance company just like a traditional visit in the office.    As this is a virtual visit, video technology does not allow for your provider to perform a traditional examination.  This may limit your provider's ability to fully assess your condition.  If your provider identifies any concerns that need to be evaluated in person or the need to arrange testing (such as labs, EKG, etc.), we will make arrangements to do so.     Although advances in technology are sophisticated, we cannot ensure that it will always work on either your end or our end.  If the connection with a video visit is poor, the visit may have to be switched to a telephone visit.  With either a video or telephone visit, we are not always able to ensure that we have a secure connection.     I need to obtain your verbal consent now.   Are you willing to proceed with your visit today?    Heidi Holland has provided verbal consent on 03/20/2021 for a virtual visit (video or telephone).   Heidi Holland, [place]   Date: 03/20/2021 10:03 AM   Virtual Visit via Video Note   I, Heidi Holland, connected with  Heidi Holland  ([id number], Jan 05, 1976) on 03/20/21 at 10:00 AM EST by a video-enabled telemedicine application and verified that I am speaking with the correct person using two identifiers.  Location: Patient: Virtual Visit Location Patient: Home Provider: Virtual Visit Location Provider: Home Office   I discussed the limitations of evaluation and management by  telemedicine and the availability of in person appointments. The patient expressed understanding and agreed to proceed.    History of Present Illness: Heidi Holland is a 46 y.o. who identifies as a female who was assigned female at birth, and is being seen today for possible sinusitis. Notes history of sinusitis as an adult but tries to treat at home first. Notes over the past 10 days having significant sinus pressure, nasal congestion with maxillary sinus pain and upper tooth pain. Unsure of fever but has had episodes of sweats and chills throughout illness. Some cough now due to PND. Has taken Sudafed, Nyquil for symptoms.    HPI: HPI  Problems:  Patient Active Problem List   Diagnosis Date Noted   CIN I (cervical intraepithelial neoplasia I) 03/03/2021   LSIL pap smear of cervix/human papillomavirus (HPV) positive 10/24/2020   Family history of early CAD 10/24/2020   B12 deficiency 07/26/2020   Somatic dysfunction of spine, cervical 07/21/2020   Status post left hip replacement 03/12/2019   T2DM (type 2 diabetes mellitus) ([place]) 07/31/2018   Dyslipidemia associated with type 2 diabetes mellitus ([place]) 07/31/2018   Allergic rhinitis 07/31/2018   Mild intermittent asthma, uncomplicated 83/38/2505   GERD (gastroesophageal reflux disease) 07/31/2018   S/P gastric bypass 07/31/2018   Migraine 07/31/2018   Anxiety 07/31/2018   Depression, major, single episode, complete remission ([place]) 07/31/2018   Insomnia  51/88/4166   Umbilical hernia 08/12/1599   Iron deficiency anemia 06/09/2018   Cervicogenic headache 05/12/2018   Low back pain 05/12/2018   Polyarthralgia 05/12/2018    Allergies:  Allergies  Allergen Reactions   Ciprofloxacin Itching   Medications:  Current Outpatient Medications:    amoxicillin-clavulanate (AUGMENTIN) 875-125 MG tablet, Take 1 tablet by mouth 2 (two) times daily., Disp: 14 tablet, Rfl: 0   predniSONE (DELTASONE) 20 MG tablet, Take 2 tablets (40 mg total) by  mouth daily with breakfast., Disp: 10 tablet, Rfl: 0   albuterol (VENTOLIN HFA) 108 (90 Base) MCG/ACT inhaler, INHALE 2 PUFFS BY MOUTH EVERY 6 HOURS AS NEEDED FOR WHEEZE OR SHORTNESS OF BREATH, Disp: 8.5 each, Rfl: 2   azelastine (ASTELIN) 0.1 % nasal spray, Place 2 sprays into both nostrils 2 (two) times daily. Use in each nostril as directed, Disp: 30 mL, Rfl: 5   blood glucose meter kit and supplies KIT, Dispense based on patient and insurance preference. Use up to four times daily as directed. (FOR ICD-9 250.00, 250.01)., Disp: 1 each, Rfl: 0   buPROPion (WELLBUTRIN SR) 150 MG 12 hr tablet, Take 150 mg by mouth daily., Disp: , Rfl:    clonazePAM (KLONOPIN) 0.5 MG tablet, Take 1 tablet (0.5 mg total) by mouth 2 (two) times daily as needed for anxiety., Disp: 60 tablet, Rfl: 5   Continuous Blood Gluc Receiver (FREESTYLE LIBRE READER) DEVI, 1 Device by Does not apply route daily as needed (to check blood sugar)., Disp: 1 Device, Rfl: 0   Continuous Blood Gluc Sensor (FREESTYLE LIBRE SENSOR SYSTEM) MISC, Apply 1 sensor to skin every 14 days., Disp: 2 each, Rfl: 11   metFORMIN (GLUCOPHAGE) 1000 MG tablet, TAKE 1 TABLET BY MOUTH TWICE A DAY WITH A MEAL, Disp: 180 tablet, Rfl: 3   nabumetone (RELAFEN) 500 MG tablet, Take 1 tablet (500 mg total) by mouth 2 (two) times daily as needed for mild pain or moderate pain., Disp: 20 tablet, Rfl: 0   ondansetron (ZOFRAN-ODT) 4 MG disintegrating tablet, Take 1 tablet (4 mg total) by mouth every 8 (eight) hours as needed for nausea or vomiting., Disp: 20 tablet, Rfl: 0   oxyCODONE-acetaminophen (PERCOCET/ROXICET) 5-325 MG tablet, Take 1 tablet by mouth every 6 (six) hours as needed for severe pain., Disp: 10 tablet, Rfl: 0   pantoprazole (PROTONIX) 40 MG tablet, TAKE 1 TABLET BY MOUTH EVERY DAY IN THE EVENING, Disp: 90 tablet, Rfl: 0   pregabalin (LYRICA) 150 MG capsule, Take 1 capsule (150 mg total) by mouth 2 (two) times daily., Disp: 60 capsule, Rfl: 5    SUMAtriptan (TOSYMRA) 10 MG/ACT SOLN, PLACE 1 SPRAY INTO THE NOSE EVERY HOUR AS NEEDED (MAXIMUM 3 SPRAYS IN 24 HOURS), Disp: 6 each, Rfl: 5   traMADol (ULTRAM) 50 MG tablet, Take 1 tablet (50 mg total) by mouth every 8 (eight) hours as needed. (Patient not taking: Reported on 03/15/2021), Disp: 90 tablet, Rfl: 5   zolpidem (AMBIEN) 5 MG tablet, Take 1 tablet (5 mg total) by mouth at bedtime as needed for sleep., Disp: 30 tablet, Rfl: 5   zonisamide (ZONEGRAN) 50 MG capsule, Take 1 capsule daily for 7 days, then increase to 2 capsules daily, Disp: 60 capsule, Rfl: 0  Observations/Objective: Patient is well-developed, well-nourished in no acute distress.  Resting comfortably at home.  Head is normocephalic, atraumatic.  No labored breathing. Speech is clear and coherent with logical content.  Patient is alert and oriented at baseline.   Assessment  and Plan: 1. Acute bacterial sinusitis - amoxicillin-clavulanate (AUGMENTIN) 875-125 MG tablet; Take 1 tablet by mouth 2 (two) times daily.  Dispense: 14 tablet; Refill: 0 - predniSONE (DELTASONE) 20 MG tablet; Take 2 tablets (40 mg total) by mouth daily with breakfast.  Dispense: 10 tablet; Refill: 0  Rx Augmentin.  Increase fluids.  Rest.  Saline nasal spray.  Probiotic.  Mucinex as directed.  Humidifier in bedroom. Giving severity of sinus inflammation will add on a burst of Prednisone 40 mg as well.  Call or return to clinic if symptoms are not improving.   Follow Up Instructions: I discussed the assessment and treatment plan with the patient. The patient was provided an opportunity to ask questions and all were answered. The patient agreed with the plan and demonstrated an understanding of the instructions.  A copy of instructions were sent to the patient via MyChart unless otherwise noted below.   The patient was advised to call back or seek an in-person evaluation if the symptoms worsen or if the condition fails to improve as  anticipated.  Time:  I spent 8 minutes with the patient via telehealth technology discussing the above problems/concerns.    Heidi Rio, PA-C

## 2021-03-20 NOTE — Telephone Encounter (Signed)
F/u  Heidi Holland Key: [id number] - PA Case ID: [id number] - Rx #: [id number] Need help? Call us at [phone] Outcome Approvedtoday CaseId:[id number];Status:Approved;Review Type:Prior Auth;Coverage Start Date:02/14/2021;Coverage End Date:03/20/2022; Drug AJOVY (fremanezumab-vfrm) injection 225MG /1.5ML auto-injectors Form [employer] Electronic PA Form (2017 NCPDP) Original Claim Info 75 FOR PA, HELP DESK FOR EMRG OVD, SCC=13 PER [facility] DISCRETION

## 2021-03-20 NOTE — Telephone Encounter (Signed)
The original prescription was discontinued on 02/24/2021 by Raspet, Noberto Retort, PA-C. Renewing this prescription may not be appropriate.

## 2021-03-20 NOTE — Patient Instructions (Signed)
[name], thank you for joining Leeanne Rio, PA-C for today's virtual visit.  While this provider is not your primary care provider (PCP), if your PCP is located in our provider database this encounter information will be shared with them immediately following your visit.  Consent: (Patient) Heidi Holland provided verbal consent for this virtual visit at the beginning of the encounter.  Current Medications:  Current Outpatient Medications:    albuterol (VENTOLIN HFA) 108 (90 Base) MCG/ACT inhaler, INHALE 2 PUFFS BY MOUTH EVERY 6 HOURS AS NEEDED FOR WHEEZE OR SHORTNESS OF BREATH, Disp: 8.5 each, Rfl: 2   azelastine (ASTELIN) 0.1 % nasal spray, Place 2 sprays into both nostrils 2 (two) times daily. Use in each nostril as directed, Disp: 30 mL, Rfl: 5   baclofen (LIORESAL) 10 MG tablet, Take 1 tablet (10 mg total) by mouth 2 (two) times daily., Disp: 14 each, Rfl: 0   blood glucose meter kit and supplies KIT, Dispense based on patient and insurance preference. Use up to four times daily as directed. (FOR ICD-9 250.00, 250.01)., Disp: 1 each, Rfl: 0   buPROPion (WELLBUTRIN SR) 150 MG 12 hr tablet, Take 150 mg by mouth daily., Disp: , Rfl:    clonazePAM (KLONOPIN) 0.5 MG tablet, Take 1 tablet (0.5 mg total) by mouth 2 (two) times daily as needed for anxiety., Disp: 60 tablet, Rfl: 5   Continuous Blood Gluc Receiver (FREESTYLE LIBRE READER) DEVI, 1 Device by Does not apply route daily as needed (to check blood sugar)., Disp: 1 Device, Rfl: 0   Continuous Blood Gluc Sensor (FREESTYLE LIBRE SENSOR SYSTEM) MISC, Apply 1 sensor to skin every 14 days., Disp: 2 each, Rfl: 11   Fremanezumab-vfrm (AJOVY) 225 MG/1.5ML SOAJ, Inject 225 mg into the skin every 30 (thirty) days., Disp: 1.68 mL, Rfl: 5   lamoTRIgine (LAMICTAL) 200 MG tablet, Take 200 mg by mouth. , Disp: , Rfl:    metFORMIN (GLUCOPHAGE) 1000 MG tablet, TAKE 1 TABLET BY MOUTH TWICE A DAY WITH A MEAL, Disp: 180 tablet, Rfl: 3   nabumetone  (RELAFEN) 500 MG tablet, Take 1 tablet (500 mg total) by mouth 2 (two) times daily as needed for mild pain or moderate pain., Disp: 20 tablet, Rfl: 0   ondansetron (ZOFRAN-ODT) 4 MG disintegrating tablet, Take 1 tablet (4 mg total) by mouth every 8 (eight) hours as needed for nausea or vomiting., Disp: 20 tablet, Rfl: 0   oxyCODONE-acetaminophen (PERCOCET/ROXICET) 5-325 MG tablet, Take 1 tablet by mouth every 6 (six) hours as needed for severe pain., Disp: 10 tablet, Rfl: 0   pantoprazole (PROTONIX) 40 MG tablet, TAKE 1 TABLET BY MOUTH EVERY DAY IN THE EVENING, Disp: 90 tablet, Rfl: 0   predniSONE (STERAPRED UNI-PAK 21 TAB) 10 MG (21) TBPK tablet, As directed, Disp: 21 tablet, Rfl: 0   pregabalin (LYRICA) 150 MG capsule, Take 1 capsule (150 mg total) by mouth 2 (two) times daily., Disp: 60 capsule, Rfl: 5   rizatriptan (MAXALT-MLT) 10 MG disintegrating tablet, Take 1 tablet (10 mg total) by mouth as needed for migraine. May repeat in 2 hours if needed, Disp: 9 tablet, Rfl: 5   SUMAtriptan (TOSYMRA) 10 MG/ACT SOLN, PLACE 1 SPRAY INTO THE NOSE EVERY HOUR AS NEEDED (MAXIMUM 3 SPRAYS IN 24 HOURS), Disp: 6 each, Rfl: 5   traMADol (ULTRAM) 50 MG tablet, Take 1 tablet (50 mg total) by mouth every 8 (eight) hours as needed. (Patient not taking: Reported on 03/15/2021), Disp: 90 tablet, Rfl: 5  zolpidem (AMBIEN) 5 MG tablet, Take 1 tablet (5 mg total) by mouth at bedtime as needed for sleep., Disp: 30 tablet, Rfl: 5   zonisamide (ZONEGRAN) 50 MG capsule, Take 1 capsule daily for 7 days, then increase to 2 capsules daily, Disp: 60 capsule, Rfl: 0   Medications ordered in this encounter:  No orders of the defined types were placed in this encounter.    *If you need refills on other medications prior to your next appointment, please contact your pharmacy*  Follow-Up: Call back or seek an in-person evaluation if the symptoms worsen or if the condition fails to improve as anticipated.  Other  Instructions Please take antibiotic as directed.  Increase fluid intake.  Use Saline nasal spray.  Take a daily multivitamin. Use the steroid as directed. Start Flonase OTC. Ok to continue your Sudafed.  Place a humidifier in the bedroom.  Please call or return clinic if symptoms are not improving.  Sinusitis Sinusitis is redness, soreness, and swelling (inflammation) of the paranasal sinuses. Paranasal sinuses are air pockets within the bones of your face (beneath the eyes, the middle of the forehead, or above the eyes). In healthy paranasal sinuses, mucus is able to drain out, and air is able to circulate through them by way of your nose. However, when your paranasal sinuses are inflamed, mucus and air can become trapped. This can allow bacteria and other germs to grow and cause infection. Sinusitis can develop quickly and last only a short time (acute) or continue over a long period (chronic). Sinusitis that lasts for more than 12 weeks is considered chronic.  CAUSES  Causes of sinusitis include: Allergies. Structural abnormalities, such as displacement of the cartilage that separates your nostrils (deviated septum), which can decrease the air flow through your nose and sinuses and affect sinus drainage. Functional abnormalities, such as when the small hairs (cilia) that line your sinuses and help remove mucus do not work properly or are not present. SYMPTOMS  Symptoms of acute and chronic sinusitis are the same. The primary symptoms are pain and pressure around the affected sinuses. Other symptoms include: Upper toothache. Earache. Headache. Bad breath. Decreased sense of smell and taste. A cough, which worsens when you are lying flat. Fatigue. Fever. Thick drainage from your nose, which often is green and may contain pus (purulent). Swelling and warmth over the affected sinuses. DIAGNOSIS  Your caregiver will perform a physical exam. During the exam, your caregiver may: Look in your  nose for signs of abnormal growths in your nostrils (nasal polyps). Tap over the affected sinus to check for signs of infection. View the inside of your sinuses (endoscopy) with a special imaging device with a light attached (endoscope), which is inserted into your sinuses. If your caregiver suspects that you have chronic sinusitis, one or more of the following tests may be recommended: Allergy tests. Nasal culture A sample of mucus is taken from your nose and sent to a lab and screened for bacteria. Nasal cytology A sample of mucus is taken from your nose and examined by your caregiver to determine if your sinusitis is related to an allergy. TREATMENT  Most cases of acute sinusitis are related to a viral infection and will resolve on their own within 10 days. Sometimes medicines are prescribed to help relieve symptoms (pain medicine, decongestants, nasal steroid sprays, or saline sprays).  However, for sinusitis related to a bacterial infection, your caregiver will prescribe antibiotic medicines. These are medicines that will help kill the  bacteria causing the infection.  Rarely, sinusitis is caused by a fungal infection. In theses cases, your caregiver will prescribe antifungal medicine. For some cases of chronic sinusitis, surgery is needed. Generally, these are cases in which sinusitis recurs more than 3 times per year, despite other treatments. HOME CARE INSTRUCTIONS  Drink plenty of water. Water helps thin the mucus so your sinuses can drain more easily. Use a humidifier. Inhale steam 3 to 4 times a day (for example, sit in the bathroom with the shower running). Apply a warm, moist washcloth to your face 3 to 4 times a day, or as directed by your caregiver. Use saline nasal sprays to help moisten and clean your sinuses. Take over-the-counter or prescription medicines for pain, discomfort, or fever only as directed by your caregiver. SEEK IMMEDIATE MEDICAL CARE IF: You have increasing pain  or severe headaches. You have nausea, vomiting, or drowsiness. You have swelling around your face. You have vision problems. You have a stiff neck. You have difficulty breathing. MAKE SURE YOU:  Understand these instructions. Will watch your condition. Will get help right away if you are not doing well or get worse. Document Released: 01/29/2005 Document Revised: 04/23/2011 Document Reviewed: 02/13/2011 [facility] Patient Information 2014 [place], [place].    If you have been instructed to have an in-person evaluation today at a local Urgent Care facility, please use the link below. It will take you to a list of all of our available [facility], including address, phone number and hours of operation. Please do not delay care.  [facility]  If you or a family member do not have a primary care provider, use the link below to schedule a visit and establish care. When you choose a [facility] primary care physician or advanced practice provider, you gain a long-term partner in health. Find a Primary Care Provider  Learn more about [facility] in-office and virtual care options: [facility]

## 2021-03-21 ENCOUNTER — Telehealth: Payer: Self-pay | Admitting: Family Medicine

## 2021-03-21 NOTE — Telephone Encounter (Signed)
F/u   Call patient insurance [facility] [phone]   Submit PA at [facility] [email or web address] or fax to  [phone]  Clinical notes fax to [facility] for prior authorization - Botox 200 unit   CoverMyMeds   Sahar Ryback Key: [id number] help? Call us at [phone] Status New(Not sent to plan) Cannot find matching patient with Name and Date of Birth provided. For additional information, please contact the phone number on the back of the member prescription ID card. Drug Botox 200UNIT solution Form [employer] Electronic Prior Authorization Form (2017 [place])

## 2021-03-21 NOTE — Telephone Encounter (Signed)
Pt is asking if Dr [name] would add her sister as a new patient. They live in the same household. Sister's name is Gavriela Cashin, DOB-01/07/87 She currently has [employer]. Please Advise

## 2021-03-21 NOTE — Telephone Encounter (Signed)
Please advise 

## 2021-03-22 ENCOUNTER — Telehealth: Payer: Self-pay | Admitting: Neurology

## 2021-03-22 NOTE — Telephone Encounter (Signed)
Per pt she has FMLA and Disability forms coming to be filled out.

## 2021-03-22 NOTE — Telephone Encounter (Signed)
Patient has been out of work for 1 week due to migraines. She was able to recv part time disability, they will send paper over soon. She needs a work note so she can go back to work. She said she can pick it up or it can be emailed.

## 2021-03-23 NOTE — Telephone Encounter (Signed)
Patient called asking if her paperwork has been filled out.

## 2021-03-23 NOTE — Telephone Encounter (Signed)
Ok to schedule patient sister for new patient appointment  Nunzio Cobbs, DOB-01/[birth year]

## 2021-03-23 NOTE — Telephone Encounter (Signed)
Yes that is ok to schedule as a new patient visit.  Algis Greenhouse. Jerline Pain, MD 03/23/2021 7:56 AM

## 2021-03-23 NOTE — Telephone Encounter (Signed)
Scheduled

## 2021-03-24 ENCOUNTER — Ambulatory Visit: Payer: 59 | Admitting: Gastroenterology

## 2021-03-24 NOTE — Telephone Encounter (Signed)
Patient called and stated she needs a note stating she can return to work.

## 2021-03-24 NOTE — Telephone Encounter (Signed)
Pt advised letter ready.

## 2021-03-24 NOTE — Telephone Encounter (Signed)
Heidi Holland with [employer], she will be in this AM to pick up her work excuse letter so she can go back to work.

## 2021-03-28 ENCOUNTER — Telehealth: Payer: 59 | Admitting: Emergency Medicine

## 2021-03-28 DIAGNOSIS — B370 Candidal stomatitis: Secondary | ICD-10-CM

## 2021-03-28 DIAGNOSIS — B9689 Other specified bacterial agents as the cause of diseases classified elsewhere: Secondary | ICD-10-CM

## 2021-03-28 DIAGNOSIS — J019 Acute sinusitis, unspecified: Secondary | ICD-10-CM | POA: Diagnosis not present

## 2021-03-28 MED ORDER — PREDNISONE 10 MG PO TABS: 10.0000 mg | ORAL_TABLET | Freq: Every day | ORAL | 0 refills | Status: DC

## 2021-03-28 MED ORDER — DOXYCYCLINE HYCLATE 100 MG PO TABS: 100.0000 mg | ORAL_TABLET | Freq: Two times a day (BID) | ORAL | 0 refills | Status: DC

## 2021-03-28 MED ORDER — BENZONATATE 100 MG PO CAPS: 100.0000 mg | ORAL_CAPSULE | Freq: Two times a day (BID) | ORAL | 0 refills | Status: DC | PRN

## 2021-03-28 MED ORDER — NYSTATIN 100000 UNIT/ML MT SUSP: 5.0000 mL | Freq: Four times a day (QID) | OROMUCOSAL | 0 refills | Status: DC

## 2021-03-28 NOTE — Patient Instructions (Signed)
[name], thank you for joining Carvel Getting, NP for today's virtual visit.  While this provider is not your primary care provider (PCP), if your PCP is located in our provider database this encounter information will be shared with them immediately following your visit.  Consent: (Patient) Heidi Holland provided verbal consent for this virtual visit at the beginning of the encounter.  Current Medications:  Current Outpatient Medications:    benzonatate (TESSALON) 100 MG capsule, Take 1 capsule (100 mg total) by mouth 2 (two) times daily as needed for cough., Disp: 20 capsule, Rfl: 0   doxycycline (VIBRA-TABS) 100 MG tablet, Take 1 tablet (100 mg total) by mouth 2 (two) times daily., Disp: 20 tablet, Rfl: 0   nystatin (MYCOSTATIN) 100000 UNIT/ML suspension, Take 5 mLs (500,000 Units total) by mouth 4 (four) times daily., Disp: 200 mL, Rfl: 0   predniSONE (DELTASONE) 10 MG tablet, Take 1 tablet (10 mg total) by mouth daily with breakfast. Take 4 tablets daily for 4 days, then take 3 tablets daily for 4 days, then take 2 tablets daily for 4 days, then take 1 tablet daily for 4 days., Disp: 40 tablet, Rfl: 0   albuterol (VENTOLIN HFA) 108 (90 Base) MCG/ACT inhaler, INHALE 2 PUFFS BY MOUTH EVERY 6 HOURS AS NEEDED FOR WHEEZE OR SHORTNESS OF BREATH, Disp: 8.5 each, Rfl: 2   azelastine (ASTELIN) 0.1 % nasal spray, Place 2 sprays into both nostrils 2 (two) times daily. Use in each nostril as directed, Disp: 30 mL, Rfl: 5   blood glucose meter kit and supplies KIT, Dispense based on patient and insurance preference. Use up to four times daily as directed. (FOR ICD-9 250.00, 250.01)., Disp: 1 each, Rfl: 0   buPROPion (WELLBUTRIN SR) 150 MG 12 hr tablet, Take 150 mg by mouth daily., Disp: , Rfl:    clonazePAM (KLONOPIN) 0.5 MG tablet, Take 1 tablet (0.5 mg total) by mouth 2 (two) times daily as needed for anxiety., Disp: 60 tablet, Rfl: 5   Continuous Blood Gluc Receiver (FREESTYLE LIBRE READER) DEVI, 1  Device by Does not apply route daily as needed (to check blood sugar)., Disp: 1 Device, Rfl: 0   Continuous Blood Gluc Sensor (FREESTYLE LIBRE SENSOR SYSTEM) MISC, Apply 1 sensor to skin every 14 days., Disp: 2 each, Rfl: 11   metFORMIN (GLUCOPHAGE) 1000 MG tablet, TAKE 1 TABLET BY MOUTH TWICE A DAY WITH A MEAL, Disp: 180 tablet, Rfl: 3   nabumetone (RELAFEN) 500 MG tablet, Take 1 tablet (500 mg total) by mouth 2 (two) times daily as needed for mild pain or moderate pain., Disp: 20 tablet, Rfl: 0   ondansetron (ZOFRAN-ODT) 4 MG disintegrating tablet, Take 1 tablet (4 mg total) by mouth every 8 (eight) hours as needed for nausea or vomiting., Disp: 20 tablet, Rfl: 0   oxyCODONE-acetaminophen (PERCOCET/ROXICET) 5-325 MG tablet, Take 1 tablet by mouth every 6 (six) hours as needed for severe pain., Disp: 10 tablet, Rfl: 0   pantoprazole (PROTONIX) 40 MG tablet, TAKE 1 TABLET BY MOUTH EVERY DAY IN THE EVENING, Disp: 90 tablet, Rfl: 0   pregabalin (LYRICA) 150 MG capsule, Take 1 capsule (150 mg total) by mouth 2 (two) times daily., Disp: 60 capsule, Rfl: 5   SUMAtriptan (TOSYMRA) 10 MG/ACT SOLN, PLACE 1 SPRAY INTO THE NOSE EVERY HOUR AS NEEDED (MAXIMUM 3 SPRAYS IN 24 HOURS), Disp: 6 each, Rfl: 5   tiZANidine (ZANAFLEX) 4 MG tablet, TAKE ONE TABLET BY MOUTH THREE TIMES DAILY, Disp:  90 tablet, Rfl: 0   traMADol (ULTRAM) 50 MG tablet, Take 1 tablet (50 mg total) by mouth every 8 (eight) hours as needed. (Patient not taking: Reported on 03/15/2021), Disp: 90 tablet, Rfl: 5   zolpidem (AMBIEN) 5 MG tablet, Take 1 tablet (5 mg total) by mouth at bedtime as needed for sleep., Disp: 30 tablet, Rfl: 5   zonisamide (ZONEGRAN) 50 MG capsule, Take 1 capsule daily for 7 days, then increase to 2 capsules daily, Disp: 60 capsule, Rfl: 0   Medications ordered in this encounter:  Meds ordered this encounter  Medications   nystatin (MYCOSTATIN) 100000 UNIT/ML suspension    Sig: Take 5 mLs (500,000 Units total) by mouth 4  (four) times daily.    Dispense:  200 mL    Refill:  0   doxycycline (VIBRA-TABS) 100 MG tablet    Sig: Take 1 tablet (100 mg total) by mouth 2 (two) times daily.    Dispense:  20 tablet    Refill:  0   predniSONE (DELTASONE) 10 MG tablet    Sig: Take 1 tablet (10 mg total) by mouth daily with breakfast. Take 4 tablets daily for 4 days, then take 3 tablets daily for 4 days, then take 2 tablets daily for 4 days, then take 1 tablet daily for 4 days.    Dispense:  40 tablet    Refill:  0   benzonatate (TESSALON) 100 MG capsule    Sig: Take 1 capsule (100 mg total) by mouth 2 (two) times daily as needed for cough.    Dispense:  20 capsule    Refill:  0     *If you need refills on other medications prior to your next appointment, please contact your pharmacy*  Follow-Up: Call back or seek an in-person evaluation if the symptoms worsen or if the condition fails to improve as anticipated.  Other Instructions Please use nasal saline spray several times a day to help your congestion drain from your nose instead of down the back of your throat.  Use your albuterol inhaler every 4-6 hours if needed for wheezing or shortness of breath.  If you find you are needing to use it more often than 6 times a day, you may need to be evaluated in person for different therapy.  Please be aware that your blood sugars will be elevated while taking the prednisone.   If you have been instructed to have an in-person evaluation today at a local Urgent Care facility, please use the link below. It will take you to a list of all of our available [facility], including address, phone number and hours of operation. Please do not delay care.  [facility]  If you or a family member do not have a primary care provider, use the link below to schedule a visit and establish care. When you choose a [facility] primary care physician or advanced practice provider, you gain a long-term partner in  health. Find a Primary Care Provider  Learn more about 's in-office and virtual care options: [facility]

## 2021-03-28 NOTE — Progress Notes (Signed)
Virtual Visit Consent   Heidi Holland, you are scheduled for a virtual visit with a [facility] provider today.     Just as with appointments in the office, your consent must be obtained to participate.  Your consent will be active for this visit and any virtual visit you may have with one of our providers in the next 365 days.     If you have a MyChart account, a copy of this consent can be sent to you electronically.  All virtual visits are billed to your insurance company just like a traditional visit in the office.    As this is a virtual visit, video technology does not allow for your provider to perform a traditional examination.  This may limit your provider's ability to fully assess your condition.  If your provider identifies any concerns that need to be evaluated in person or the need to arrange testing (such as labs, EKG, etc.), we will make arrangements to do so.     Although advances in technology are sophisticated, we cannot ensure that it will always work on either your end or our end.  If the connection with a video visit is poor, the visit may have to be switched to a telephone visit.  With either a video or telephone visit, we are not always able to ensure that we have a secure connection.     I need to obtain your verbal consent now.   Are you willing to proceed with your visit today?    Heidi Holland has provided verbal consent on 03/28/2021 for a virtual visit (video or telephone).   Carvel Getting, NP   Date: 03/28/2021 1:15 PM   Virtual Visit via Video Note   I, Carvel Getting, connected with  Heidi Holland  ([id number], 1976/01/26) on 03/28/21 at  1:00 PM EST by a video-enabled telemedicine application and verified that I am speaking with the correct person using two identifiers.  Location: Patient: Virtual Visit Location Patient: Home Provider: Virtual Visit Location Provider: Home Office   I discussed the limitations of evaluation and management by telemedicine and  the availability of in person appointments. The patient expressed understanding and agreed to proceed.    History of Present Illness: Heidi Holland is a 46 y.o. who identifies as a female who was assigned female at birth, and is being seen today for sinus infection.  She reports she was seen a week ago on a video visit and was treated with Augmentin and prednisone for sinus infection.  She felt that the Augmentin did not help her but the prednisone did temporarily.  She is not having any drainage from her nose, she reports it is all postnasal drainage down the back of her throat.  When she coughs she produces yellowish-brown sputum.  She does have asthma and she has been increasingly using her albuterol up to 4 times a day due to wheezing and shortness of breath.  She denies fever.  She reports chills.  She is requesting a different medicine for sinus infection.  She also reports developing thrush after the Augmentin.  She reports when she takes antibiotics she does not get vaginal yeast infections, but she will get thrush.  She has not had thrush in years, but does feel like she has it now.  She reports a sore throat and mouth and white patches in the back of her mouth and throat.  HPI: HPI  Problems:  Patient Active Problem List   Diagnosis Date  Noted   CIN I (cervical intraepithelial neoplasia I) 03/03/2021   LSIL pap smear of cervix/human papillomavirus (HPV) positive 10/24/2020   Family history of early CAD 10/24/2020   B12 deficiency 07/26/2020   Somatic dysfunction of spine, cervical 07/21/2020   Status post left hip replacement 03/12/2019   T2DM (type 2 diabetes mellitus) ([place]) 07/31/2018   Dyslipidemia associated with type 2 diabetes mellitus ([place]) 07/31/2018   Allergic rhinitis 07/31/2018   Mild intermittent asthma, uncomplicated 19/14/7829   GERD (gastroesophageal reflux disease) 07/31/2018   S/P gastric bypass 07/31/2018   Migraine 07/31/2018   Anxiety 07/31/2018   Depression,  major, single episode, complete remission ([place]) 07/31/2018   Insomnia 56/21/3086   Umbilical hernia 57/84/6962   Iron deficiency anemia 06/09/2018   Cervicogenic headache 05/12/2018   Low back pain 05/12/2018   Polyarthralgia 05/12/2018    Allergies:  Allergies  Allergen Reactions   Ciprofloxacin Itching   Medications:  Current Outpatient Medications:    benzonatate (TESSALON) 100 MG capsule, Take 1 capsule (100 mg total) by mouth 2 (two) times daily as needed for cough., Disp: 20 capsule, Rfl: 0   doxycycline (VIBRA-TABS) 100 MG tablet, Take 1 tablet (100 mg total) by mouth 2 (two) times daily., Disp: 20 tablet, Rfl: 0   nystatin (MYCOSTATIN) 100000 UNIT/ML suspension, Take 5 mLs (500,000 Units total) by mouth 4 (four) times daily., Disp: 200 mL, Rfl: 0   predniSONE (DELTASONE) 10 MG tablet, Take 1 tablet (10 mg total) by mouth daily with breakfast. Take 4 tablets daily for 4 days, then take 3 tablets daily for 4 days, then take 2 tablets daily for 4 days, then take 1 tablet daily for 4 days., Disp: 40 tablet, Rfl: 0   albuterol (VENTOLIN HFA) 108 (90 Base) MCG/ACT inhaler, INHALE 2 PUFFS BY MOUTH EVERY 6 HOURS AS NEEDED FOR WHEEZE OR SHORTNESS OF BREATH, Disp: 8.5 each, Rfl: 2   azelastine (ASTELIN) 0.1 % nasal spray, Place 2 sprays into both nostrils 2 (two) times daily. Use in each nostril as directed, Disp: 30 mL, Rfl: 5   blood glucose meter kit and supplies KIT, Dispense based on patient and insurance preference. Use up to four times daily as directed. (FOR ICD-9 250.00, 250.01)., Disp: 1 each, Rfl: 0   buPROPion (WELLBUTRIN SR) 150 MG 12 hr tablet, Take 150 mg by mouth daily., Disp: , Rfl:    clonazePAM (KLONOPIN) 0.5 MG tablet, Take 1 tablet (0.5 mg total) by mouth 2 (two) times daily as needed for anxiety., Disp: 60 tablet, Rfl: 5   Continuous Blood Gluc Receiver (FREESTYLE LIBRE READER) DEVI, 1 Device by Does not apply route daily as needed (to check blood sugar)., Disp: 1 Device,  Rfl: 0   Continuous Blood Gluc Sensor (FREESTYLE LIBRE SENSOR SYSTEM) MISC, Apply 1 sensor to skin every 14 days., Disp: 2 each, Rfl: 11   metFORMIN (GLUCOPHAGE) 1000 MG tablet, TAKE 1 TABLET BY MOUTH TWICE A DAY WITH A MEAL, Disp: 180 tablet, Rfl: 3   nabumetone (RELAFEN) 500 MG tablet, Take 1 tablet (500 mg total) by mouth 2 (two) times daily as needed for mild pain or moderate pain., Disp: 20 tablet, Rfl: 0   ondansetron (ZOFRAN-ODT) 4 MG disintegrating tablet, Take 1 tablet (4 mg total) by mouth every 8 (eight) hours as needed for nausea or vomiting., Disp: 20 tablet, Rfl: 0   oxyCODONE-acetaminophen (PERCOCET/ROXICET) 5-325 MG tablet, Take 1 tablet by mouth every 6 (six) hours as needed for severe pain., Disp: 10 tablet,  Rfl: 0   pantoprazole (PROTONIX) 40 MG tablet, TAKE 1 TABLET BY MOUTH EVERY DAY IN THE EVENING, Disp: 90 tablet, Rfl: 0   pregabalin (LYRICA) 150 MG capsule, Take 1 capsule (150 mg total) by mouth 2 (two) times daily., Disp: 60 capsule, Rfl: 5   SUMAtriptan (TOSYMRA) 10 MG/ACT SOLN, PLACE 1 SPRAY INTO THE NOSE EVERY HOUR AS NEEDED (MAXIMUM 3 SPRAYS IN 24 HOURS), Disp: 6 each, Rfl: 5   tiZANidine (ZANAFLEX) 4 MG tablet, TAKE ONE TABLET BY MOUTH THREE TIMES DAILY, Disp: 90 tablet, Rfl: 0   traMADol (ULTRAM) 50 MG tablet, Take 1 tablet (50 mg total) by mouth every 8 (eight) hours as needed. (Patient not taking: Reported on 03/15/2021), Disp: 90 tablet, Rfl: 5   zolpidem (AMBIEN) 5 MG tablet, Take 1 tablet (5 mg total) by mouth at bedtime as needed for sleep., Disp: 30 tablet, Rfl: 5   zonisamide (ZONEGRAN) 50 MG capsule, Take 1 capsule daily for 7 days, then increase to 2 capsules daily, Disp: 60 capsule, Rfl: 0  Observations/Objective: Patient is well-developed, well-nourished in no acute distress.  Resting comfortably  at home.  Head is normocephalic, atraumatic.  No labored breathing.  Speech is clear and coherent with logical content.  Patient is alert and oriented at  baseline.    Assessment and Plan: 1. Acute bacterial sinusitis  2. Thrush  Prescribe doxycycline.  Patient confirms she is not pregnant and not at risk of being pregnant during the duration of treatment.  Prescribed nystatin for thrush.  Patient is to increase her use of her albuterol as needed for wheezing.  Prescribed longer regimen/taper of prednisone.  We discussed how this will elevate her blood glucose levels.  Prescribed benzonatate for cough.  Patient to start using saline nasal spray.  Patient understands that if this treatment plan does not help resolve her symptoms, she will need to be seen in person.  Follow Up Instructions: I discussed the assessment and treatment plan with the patient. The patient was provided an opportunity to ask questions and all were answered. The patient agreed with the plan and demonstrated an understanding of the instructions.  A copy of instructions were sent to the patient via MyChart unless otherwise noted below.   The patient was advised to call back or seek an in-person evaluation if the symptoms worsen or if the condition fails to improve as anticipated.  Time:  I spent 13 minutes with the patient via telehealth technology discussing the above problems/concerns.    Carvel Getting, NP

## 2021-04-08 ENCOUNTER — Other Ambulatory Visit: Payer: Self-pay | Admitting: Family Medicine

## 2021-04-10 ENCOUNTER — Other Ambulatory Visit: Payer: Self-pay | Admitting: Neurology

## 2021-04-10 ENCOUNTER — Other Ambulatory Visit: Payer: Self-pay | Admitting: Family Medicine

## 2021-04-12 ENCOUNTER — Ambulatory Visit (INDEPENDENT_AMBULATORY_CARE_PROVIDER_SITE_OTHER): Payer: 59 | Admitting: Gastroenterology

## 2021-04-12 ENCOUNTER — Encounter: Payer: Self-pay | Admitting: Gastroenterology

## 2021-04-12 VITALS — BP 130/80 | HR 71 | Ht 63.0 in | Wt 205.6 lb

## 2021-04-12 DIAGNOSIS — K219 Gastro-esophageal reflux disease without esophagitis: Secondary | ICD-10-CM | POA: Diagnosis not present

## 2021-04-12 DIAGNOSIS — R109 Unspecified abdominal pain: Secondary | ICD-10-CM

## 2021-04-12 MED ORDER — NA SULFATE-K SULFATE-MG SULF 17.5-3.13-1.6 GM/177ML PO SOLN: 1.0000 | Freq: Once | ORAL | 0 refills | Status: AC

## 2021-04-12 NOTE — Patient Instructions (Addendum)
It was my pleasure to provide care to you today. Based on our discussion, I am providing you with my recommendations below: ? ?RECOMMENDATION(S):  ? ?Continue to take Pantoprazole. ? ?Avoid all non-steroidal anti-inflammatory medications such as Advil and ibuprofen.  ? ?I have recommend an upper endoscopy and a screening colonoscopy.  ? ?UPPER ENDOSCOPY/COLONOSCOPY:  ? ?You have been scheduled for a upper endoscopy/colonoscopy. Please follow written instructions given to you at your visit today.  ? ?PREP:  ? ?Please pick up your prep supplies at the pharmacy within the next 1-3 days. ? ?INHALERS:  ? ?If you use inhalers (even only as needed), please bring them with you on the day of your procedure. ? ?COLONOSCOPY TIPS: ? ?To reduce nausea and dehydration, stay well hydrated for 3-4 days prior to the exam.  ?To prevent skin/hemorrhoid irritation - prior to wiping, put A&Dointment or vaseline on the toilet paper. ?Keep a towel or pad on the bed.  ?BEFORE STARTING YOUR PREP, drink  64oz of clear liquids in the morning. This will help to flush the colon and will ensure you are well hydrated!!!!  ?NOTE - This is in addition to the fluids required for to complete your prep. ?Use of a flavored hard candy, such as grape Rubin Payor, can counteract some of the flavor of the prep and may prevent some nausea.  ? ? ?FOLLOW UP: ? ?After your procedure, you will receive a call from my office staff regarding my recommendation for follow up. ? ?BMI: ? ?If you are age 46 or younger, your body mass index should be between 19-25. Your Body mass index is 36.42 kg/m?Marland Kitchen If this is out of the aformentioned range listed, please consider follow up with your Primary Care Provider.  ? ?MY CHART: ? ?The [facility] providers would like to encourage you to use [facility] to communicate with providers for non-urgent requests or questions.  Due to long hold times on the telephone, sending your provider a message by [facility] may be a faster and more  efficient way to get a response.  Please allow 48 business hours for a response.  Please remember that this is for non-urgent requests.  ? ?Thank you for trusting me with your gastrointestinal care!   ? ?Tressia Danas, MD, MPH ? ?

## 2021-04-12 NOTE — Progress Notes (Addendum)
Referring Provider: Vivi Barrack, MD Primary Care Physician:  Vivi Barrack, MD  Reason for Consultation: Reflux   IMPRESSION:  Longstanding reflux. EGD recommended to evaluate for reflux related complications such as esophagitis, stricture, or Barrett's Esophagus and to evaluate for anatomic considerations such as gastric outlet obstruction or hiatal hernia that may contribute to reflux.    Intermittent abdominal pain and distension in the setting of NSAID use. Although symptoms may be due to NSAIDs, she may had adhesions from prior abdominal surgeries. Discussed strategies for management when symptoms recur,   History of localized perforated diverticulitis. Reviewed recommendations to follow a high fiber diet or to use fiber supplements on a regular basis.  However, there is no need to avoid seeds, corn, berries, and nuts. Minimizing red meat in the diet may protect against future episodes of diverticulitis. Using NSAIDs may be associated with a moderately increased risk of occurrence of any episode of diverticulitis and complicated diverticulitis.   Gastric bypass 2016  Family history of celiac (sister)  Need for colon cancer screening. Colonoscopy recommended.   PLAN: - Continue pantoprazole - Avoid all NSAIDS - EGD - Screening colonoscopy   HPI: Heidi Holland is a 46 y.o. female referred by Dr. Jerline Pain for further evaluation of reflux.  The history is obtained through the patient, her sister who accompanies her to this appointment, and review of her electronic health record.  She has asthma, type 2 diabetes, depression, anxiety, seasonal allergies, migraines previously treated with Botox, chronic low back pain, hypercholesterolemia, hypertension, obesity, sleep apnea, sinusitis, and a history of reflux. History of localized perforated diverticulitis in 2006 requiring surgery. Gastric bypass with repair of an incisional hernia in 2016 in [place]. Moved to [place] 3 years ago. Has not yet  seen GI since moving to the area.  She works in Scientist, research (medical).   She has a history of gastric bypass in 2016.  She has longstanding reflux treated with Protonix 40 mg daily.   She reports ED visits once or twice a year due to bloating and distension.  Most recently she was seen in the ED 03/17/2021 for abdominal pain with nausea and vomiting.  Attributed to frequent ibuprofen but the patient was concerned about diverticulitis.  Labs showed normal liver enzymes, normal CBC, normal lipase. CT of the abdomen and pelvis with contrast 03/17/2021 revealed her prior bariatric surgery, a 4.7 cm cystic abnormality of the left ovary, and slightly more fecal matter in the colon than would be expected.  Uses Advil PRN Uses marijuana occasionally No alcohol Drinks coffee. No caffeine beverages.   Altered diarrhea and constipation every 3-4 days but this has been for many years.   She has been worried about colon cancer because of the Cologuard commericals on TV.   History of colonoscopy after diverticulitis 2006. She remembers having polyps. No further colonoscopy.  She had EGD prior to gastric bypass in 2016  Mother with diverticulitis and IBS. There is no known family history of colon cancer or polyps. No family history of stomach cancer or other GI malignancy. No family history of inflammatory bowel disease. Sister with celiac.    Past Medical History:  Diagnosis Date   Allergy    Anxiety    Asthma    Depression    Diabetes mellitus without complication ([place])    Diverticulitis    GERD (gastroesophageal reflux disease)    HPV in female    Hyperlipidemia    Migraines     Past Surgical History:  Procedure Laterality Date   BOWEL RESECTION     gasticbypass     GASTRIC BYPASS     KNEE ARTHROSCOPY     TOTAL HIP ARTHROPLASTY Left      Current Outpatient Medications  Medication Sig Dispense Refill   albuterol (VENTOLIN HFA) 108 (90 Base) MCG/ACT inhaler INHALE 2 PUFFS BY MOUTH EVERY 6 HOURS AS  NEEDED FOR WHEEZE OR SHORTNESS OF BREATH 8.5 each 2   azelastine (ASTELIN) 0.1 % nasal spray Place 2 sprays into both nostrils 2 (two) times daily. Use in each nostril as directed 30 mL 5   blood glucose meter kit and supplies KIT Dispense based on patient and insurance preference. Use up to four times daily as directed. (FOR ICD-9 250.00, 250.01). 1 each 0   buPROPion (WELLBUTRIN SR) 150 MG 12 hr tablet Take 150 mg by mouth daily.     clonazePAM (KLONOPIN) 0.5 MG tablet Take 1 tablet (0.5 mg total) by mouth 2 (two) times daily as needed for anxiety. 60 tablet 5   Continuous Blood Gluc Receiver (FREESTYLE LIBRE READER) DEVI 1 Device by Does not apply route daily as needed (to check blood sugar). 1 Device 0   Continuous Blood Gluc Sensor (FREESTYLE LIBRE SENSOR SYSTEM) MISC Apply 1 sensor to skin every 14 days. 2 each 11   metFORMIN (GLUCOPHAGE) 1000 MG tablet TAKE 1 TABLET BY MOUTH TWICE A DAY WITH A MEAL 180 tablet 3   ondansetron (ZOFRAN-ODT) 4 MG disintegrating tablet Take 1 tablet (4 mg total) by mouth every 8 (eight) hours as needed for nausea or vomiting. 20 tablet 0   pantoprazole (PROTONIX) 40 MG tablet TAKE 1 TABLET BY MOUTH EVERY DAY IN THE EVENING 90 tablet 0   pregabalin (LYRICA) 150 MG capsule Take 1 capsule (150 mg total) by mouth 2 (two) times daily. 60 capsule 5   SUMAtriptan (TOSYMRA) 10 MG/ACT SOLN PLACE 1 SPRAY INTO THE NOSE EVERY HOUR AS NEEDED (MAXIMUM 3 SPRAYS IN 24 HOURS) 6 each 5   tiZANidine (ZANAFLEX) 4 MG tablet TAKE ONE TABLET BY MOUTH THREE TIMES DAILY 90 tablet 0   traMADol (ULTRAM) 50 MG tablet Take 1 tablet (50 mg total) by mouth every 8 (eight) hours as needed. 90 tablet 5   zolpidem (AMBIEN) 5 MG tablet Take 1 tablet (5 mg total) by mouth at bedtime as needed for sleep. 30 tablet 5   zonisamide (ZONEGRAN) 50 MG capsule Take 1 capsule daily for 7 days, then increase to 2 capsules daily 60 capsule 0   No current facility-administered medications for this visit.     Allergies as of 04/12/2021 - Review Complete 04/12/2021  Allergen Reaction Noted   Ciprofloxacin Itching 11/02/2017    Family History  Problem Relation Age of Onset   Heart disease Mother    Lung cancer Mother    Rheum arthritis Mother    Arthritis Mother    Cancer Mother    COPD Mother    27 / [place] Mother    Diabetes Father    Heart disease Father    Breast cancer Paternal Aunt     Social History   Socioeconomic History   Marital status: Single    Spouse name: Not on file   Number of children: 0   Years of education: 14   Highest education level: Associate degree: occupational, Hotel manager, or vocational program  Occupational History    Employer: [employer]  Tobacco Use   Smoking status: Never   Smokeless tobacco: Never  Vaping Use   Vaping Use: Never used  Substance and Sexual Activity   Alcohol use: Not Currently    Comment: Occ   Drug use: Yes    Types: Marijuana   Sexual activity: Not Currently  Other Topics Concern   Not on file  Social History Narrative   Right handed.    Social Determinants of Health   Financial Resource Strain: Not on file  Food Insecurity: Food Insecurity Present   Worried About [place] in the Last Year: Sometimes true   Ran Out of Food in the Last Year: Sometimes true  Transportation Needs: No Transportation Needs   Lack of Transportation (Medical): No   Lack of Transportation (Non-Medical): No  Physical Activity: Not on file  Stress: Not on file  Social Connections: Not on file  Intimate Partner Violence: Not on file    Review of Systems: 12 system ROS is negative except as noted above with addition of allergies, anxiety, arthritis, back pain, depression, fatigue, fever, headaches, menstrual pains, muscle pains, and insomnia.   Physical Exam: General:   Alert,  well-nourished, pleasant and cooperative in NAD Head:  Normocephalic and atraumatic. Eyes:  Sclera clear, no icterus.   Conjunctiva  pink. Ears:  Normal auditory acuity. Nose:  No deformity, discharge,  or lesions. Mouth:  No deformity or lesions.   Neck:  Supple; no masses or thyromegaly. Lungs:  Clear throughout to auscultation.   No wheezes. Heart:  Regular rate and rhythm; no murmurs. Abdomen:  Soft, central obesity, nontender, nondistended, normal bowel sounds, no rebound or guarding. No hepatosplenomegaly.  Well-healed surgical scars.  Rectal:  Deferred  Msk:  Symmetrical. No boney deformities LAD: No inguinal or umbilical LAD Extremities:  No clubbing or edema. Neurologic:  Alert and  oriented x4;  grossly nonfocal Skin:  Intact without significant lesions or rashes. Psych:  Alert and cooperative. Normal mood and affect.    Sarely Stracener L. Tarri Glenn, MD, MPH 04/12/2021, 10:36 AM

## 2021-04-15 ENCOUNTER — Other Ambulatory Visit: Payer: Self-pay | Admitting: Neurology

## 2021-04-17 ENCOUNTER — Other Ambulatory Visit: Payer: Self-pay | Admitting: Family Medicine

## 2021-04-17 ENCOUNTER — Other Ambulatory Visit: Payer: Self-pay | Admitting: Neurology

## 2021-04-17 MED ORDER — ZONISAMIDE 100 MG PO CAPS: 100.0000 mg | ORAL_CAPSULE | Freq: Every day | ORAL | 5 refills | Status: DC

## 2021-04-17 NOTE — Telephone Encounter (Signed)
.. ?  Encourage patient to contact the pharmacy for refills or they can request refills through [facility] ? ?LAST APPOINTMENT DATE:   ? ?NEXT APPOINTMENT DATE: 04/21/21 ? ?MEDICATION:zolpidem (AMBIEN) 5 MG tablet ? ?Is the patient out of medication? Yes ? ?PHARMACY: ?[facility] # [place], [place] Phone:  [phone]  ?Fax:  [phone]  ?  ? ? ?Let patient know to contact pharmacy at the end of the day to make sure medication is ready. ? ?Please notify patient to allow 48-72 hours to process  ?

## 2021-04-18 ENCOUNTER — Other Ambulatory Visit: Payer: Self-pay | Admitting: Family Medicine

## 2021-04-18 MED ORDER — ZOLPIDEM TARTRATE 5 MG PO TABS
5.0000 mg | ORAL_TABLET | Freq: Every evening | ORAL | 5 refills | Status: DC | PRN
Start: 2021-04-18 — End: 2023-10-18

## 2021-04-18 NOTE — Telephone Encounter (Signed)
F/u  Refax clinical notes to [facility] at [phone]

## 2021-04-19 ENCOUNTER — Other Ambulatory Visit (INDEPENDENT_AMBULATORY_CARE_PROVIDER_SITE_OTHER): Payer: 59

## 2021-04-19 ENCOUNTER — Other Ambulatory Visit: Payer: Self-pay | Admitting: Family Medicine

## 2021-04-19 ENCOUNTER — Other Ambulatory Visit: Payer: Self-pay

## 2021-04-19 DIAGNOSIS — Z13220 Encounter for screening for lipoid disorders: Secondary | ICD-10-CM

## 2021-04-19 DIAGNOSIS — M2550 Pain in unspecified joint: Secondary | ICD-10-CM

## 2021-04-19 DIAGNOSIS — E119 Type 2 diabetes mellitus without complications: Secondary | ICD-10-CM

## 2021-04-19 LAB — CBC
HCT: 36.2 % (ref 36.0–46.0)
Hemoglobin: 12.1 g/dL (ref 12.0–15.0)
MCHC: 33.3 g/dL (ref 30.0–36.0)
MCV: 79.7 fl (ref 78.0–100.0)
Platelets: 282 10*3/uL (ref 150.0–400.0)
RBC: 4.55 Mil/uL (ref 3.87–5.11)
RDW: 14.7 % (ref 11.5–15.5)
WBC: 5.2 10*3/uL (ref 4.0–10.5)

## 2021-04-19 LAB — COMPREHENSIVE METABOLIC PANEL
ALT: 10 U/L (ref 0–35)
AST: 9 U/L (ref 0–37)
Albumin: 4.1 g/dL (ref 3.5–5.2)
Alkaline Phosphatase: 87 U/L (ref 39–117)
BUN: 8 mg/dL (ref 6–23)
CO2: 24 mEq/L (ref 19–32)
Calcium: 9.1 mg/dL (ref 8.4–10.5)
Chloride: 104 mEq/L (ref 96–112)
Creatinine, Ser: 0.66 mg/dL (ref 0.40–1.20)
GFR: 105.81 mL/min (ref >60.00)
Glucose, Bld: 168 mg/dL — ABNORMAL HIGH (ref 70–99)
Potassium: 4.1 mEq/L (ref 3.5–5.1)
Sodium: 138 mEq/L (ref 135–145)
Total Bilirubin: 0.3 mg/dL (ref 0.2–1.2)
Total Protein: 6.4 g/dL (ref 6.0–8.3)

## 2021-04-19 LAB — SEDIMENTATION RATE: Sed Rate: 33 mm/hr — ABNORMAL HIGH (ref 0–20)

## 2021-04-19 LAB — TSH: TSH: 1.35 u[IU]/mL (ref 0.35–5.50)

## 2021-04-19 LAB — LIPID PANEL
Cholesterol: 230 mg/dL — ABNORMAL HIGH (ref 0–200)
HDL: 38.6 mg/dL — ABNORMAL LOW (ref >39.00)
LDL Cholesterol: 157 mg/dL — ABNORMAL HIGH (ref 0–99)
NonHDL: 191.85
Total CHOL/HDL Ratio: 6
Triglycerides: 173 mg/dL — ABNORMAL HIGH (ref 0.0–149.0)
VLDL: 34.6 mg/dL (ref 0.0–40.0)

## 2021-04-19 LAB — HEMOGLOBIN A1C: Hgb A1c MFr Bld: 8.8 % — ABNORMAL HIGH (ref 4.6–6.5)

## 2021-04-19 LAB — C-REACTIVE PROTEIN: CRP: 4.3 mg/dL (ref 0.5–20.0)

## 2021-04-20 LAB — RHEUMATOID FACTOR: Rheumatoid fact SerPl-aCnc: <14 IU/mL (ref <14)

## 2021-04-21 ENCOUNTER — Telehealth: Payer: Self-pay | Admitting: Family Medicine

## 2021-04-21 ENCOUNTER — Encounter: Payer: 59 | Admitting: Family Medicine

## 2021-04-21 DIAGNOSIS — M5417 Radiculopathy, lumbosacral region: Secondary | ICD-10-CM | POA: Insufficient documentation

## 2021-04-21 NOTE — Telephone Encounter (Signed)
Patient is having car issues and is not able to come in today- patient was able to rs cpe to 3/15- patient would like a call back from office re lab results and diabetic medication questions- if diabetic medication needs to be increased she would like it to be changed to a different brand.-  expecting a call back.  ?

## 2021-04-21 NOTE — Progress Notes (Signed)
Please inform patient of the following: ? ?Her cholesterol and blood sugar numbers are elevated compared to last year but everything else is stable.  She needs to schedule appointment soon to discuss changes to her treatment plan.

## 2021-04-21 NOTE — Telephone Encounter (Signed)
See lab results.  

## 2021-04-26 ENCOUNTER — Ambulatory Visit: Payer: 59 | Admitting: Physician Assistant

## 2021-04-26 ENCOUNTER — Encounter: Payer: 59 | Admitting: Family Medicine

## 2021-05-02 ENCOUNTER — Telehealth: Payer: Self-pay | Admitting: Neurology

## 2021-05-02 NOTE — Telephone Encounter (Signed)
Patient called to see if her authorization for Botox has gone through yet. ?

## 2021-05-02 NOTE — Telephone Encounter (Signed)
LMOVM, Will check to see if [facility] got your ov notes. And the status. ?

## 2021-05-10 ENCOUNTER — Encounter: Payer: 59 | Admitting: Family Medicine

## 2021-05-14 ENCOUNTER — Other Ambulatory Visit: Payer: Self-pay | Admitting: Family Medicine

## 2021-05-17 ENCOUNTER — Other Ambulatory Visit: Payer: Self-pay | Admitting: Family Medicine

## 2021-05-17 NOTE — Telephone Encounter (Signed)
Pt is requesting a prescription that was last prescribed by Dr [name] on 01/23/21 but discontinued by Dr Particia Nearing when pt was seen in ED on 03/17/21. Please advise ? ? ?MEDICATION:ZOFRAN-ODT) 4 MG disintegrating table ? ?PHARMACY: ?[id number] - [place], [place] - [address] Phone:  [phone]  ?Fax:  [phone]  ?  ? ?

## 2021-05-18 MED ORDER — ONDANSETRON 4 MG PO TBDP: 4.0000 mg | ORAL_TABLET | Freq: Three times a day (TID) | ORAL | 0 refills | Status: DC | PRN

## 2021-05-24 ENCOUNTER — Encounter: Payer: Self-pay | Admitting: Family Medicine

## 2021-05-24 ENCOUNTER — Ambulatory Visit (INDEPENDENT_AMBULATORY_CARE_PROVIDER_SITE_OTHER): Payer: Self-pay | Admitting: Family Medicine

## 2021-05-24 VITALS — BP 128/92 | HR 85 | Temp 97.9°F | Ht 63.0 in | Wt 206.2 lb

## 2021-05-24 DIAGNOSIS — M2550 Pain in unspecified joint: Secondary | ICD-10-CM

## 2021-05-24 DIAGNOSIS — E785 Hyperlipidemia, unspecified: Secondary | ICD-10-CM

## 2021-05-24 DIAGNOSIS — E1169 Type 2 diabetes mellitus with other specified complication: Secondary | ICD-10-CM

## 2021-05-24 DIAGNOSIS — Z0001 Encounter for general adult medical examination with abnormal findings: Secondary | ICD-10-CM

## 2021-05-24 DIAGNOSIS — F325 Major depressive disorder, single episode, in full remission: Secondary | ICD-10-CM

## 2021-05-24 DIAGNOSIS — E119 Type 2 diabetes mellitus without complications: Secondary | ICD-10-CM

## 2021-05-24 MED ORDER — TIRZEPATIDE 2.5 MG/0.5ML ~~LOC~~ SOAJ: 2.5000 mg | SUBCUTANEOUS | 0 refills | Status: DC

## 2021-05-24 MED ORDER — ROSUVASTATIN CALCIUM 20 MG PO TABS: 20.0000 mg | ORAL_TABLET | Freq: Every day | ORAL | 3 refills | Status: DC

## 2021-05-24 NOTE — Assessment & Plan Note (Signed)
Last lipid panel not at goal.  We will restart Crestor 20 mg daily.  Recheck 6 to 12 months. ?

## 2021-05-24 NOTE — Assessment & Plan Note (Signed)
A1c controlled at 8.8.  She has received several systemic steroids due to her back pain issues which could be contributing.  We will continue metformin 1000 mg twice daily.  We will start Mounjaro 2.5 mg weekly for 4 weeks and then increase dose as tolerated.  She will send me a message in few weeks via MyChart to let me know how she is doing.  We discussed potential side effects.  She will follow-up in 3 months to recheck A1c. ?

## 2021-05-24 NOTE — Assessment & Plan Note (Addendum)
Follows with psychiatry.  Currently on combination of Wellbutrin, Seroquel, and Caplyta which seems to be working well. ?

## 2021-05-24 NOTE — Progress Notes (Signed)
? ?Chief Complaint:  ?Heidi Holland is a 46 y.o. female who presents today for her annual comprehensive physical exam.   ? ?Assessment/Plan:  ?Chronic Problems Addressed Today: ?T2DM (type 2 diabetes mellitus) ([place]) ?A1c controlled at 8.8.  She has received several systemic steroids due to her back pain issues which could be contributing.  We will continue metformin 1000 mg twice daily.  We will start Mounjaro 2.5 mg weekly for 4 weeks and then increase dose as tolerated.  She will send me a message in few weeks via MyChart to let me know how she is doing.  We discussed potential side effects.  She will follow-up in 3 months to recheck A1c. ? ?Depression, major, single episode, complete remission ([place]) ?Follows with psychiatry.  Currently on combination of Wellbutrin, Seroquel, and Caplyta which seems to be working well. ? ?Dyslipidemia associated with type 2 diabetes mellitus ([place]) ?Last lipid panel not at goal.  We will restart Crestor 20 mg daily.  Recheck 6 to 12 months. ? ?Polyarthralgia ?Patient has been following with pain management at [facility].  She has been seeing Dr. Nelva Bush.  She has had 2 MRIs done recently which she was told did not show much of anything.  She still has quite a bit of pain.  We do not have any records available to review today.  We will be increasing her tramadol to 150 mg in the morning and 300 mg at night.  Continue current dose of tramadol.  Advised her to follow-up with Dr. Nelva Bush soon.  May need referral to neurosurgery depending on results of MRI. ? ? ?Preventative Healthcare: ?Follows with OB/GYN for cervical cancer screening.  She will be getting eye exam soon. ? ?Patient Counseling(The following topics were reviewed and/or handout was given): ? -Nutrition: Stressed importance of moderation in sodium/caffeine intake, saturated fat and cholesterol, caloric balance, sufficient intake of fresh fruits, vegetables, and fiber. ? -Stressed the importance of regular exercise.  ?  -Substance Abuse: Discussed cessation/primary prevention of tobacco, alcohol, or other drug use; driving or other dangerous activities under the influence; availability of treatment for abuse.  ? -Injury prevention: Discussed safety belts, safety helmets, smoke detector, smoking near bedding or upholstery.  ? -Sexuality: Discussed sexually transmitted diseases, partner selection, use of condoms, avoidance of unintended pregnancy and contraceptive alternatives.  ? -Dental health: Discussed importance of regular tooth brushing, flossing, and dental visits. ? -Health maintenance and immunizations reviewed. Please refer to Health maintenance section. ? ?Return to care in 1 year for next preventative visit.  ? ?  ?Subjective:  ?HPI: ? ?She has no acute complaints today. See A/p for status of chronic conditions.  ? ?Lifestyle ?Diet: None specific.  ?Exercise: Limited.  ? ? ?  05/24/2021  ? 10:44 AM  ?Depression screen PHQ 2/9  ?Decreased Interest 0  ?Down, Depressed, Hopeless 1  ?PHQ - 2 Score 1  ? ? ?Health Maintenance Due  ?Topic Date Due  ? FOOT EXAM  09/17/2019  ? URINE MICROALBUMIN  12/14/2020  ?  ? ?ROS: Per HPI, otherwise a complete review of systems was negative.  ? ?PMH: ? ?The following were reviewed and entered/updated in epic: ?Past Medical History:  ?Diagnosis Date  ? Allergy   ? Anxiety   ? Asthma   ? Depression   ? Diabetes mellitus without complication ([place])   ? Diverticulitis   ? GERD (gastroesophageal reflux disease)   ? HPV in female   ? Hyperlipidemia   ? Migraines   ? ?Patient  Active Problem List  ? Diagnosis Date Noted  ? CIN I (cervical intraepithelial neoplasia I) 03/03/2021  ? LSIL pap smear of cervix/human papillomavirus (HPV) positive 10/24/2020  ? Family history of early CAD 10/24/2020  ? B12 deficiency 07/26/2020  ? Somatic dysfunction of spine, cervical 07/21/2020  ? Status post left hip replacement 03/12/2019  ? T2DM (type 2 diabetes mellitus) ([place]) 07/31/2018  ? Dyslipidemia associated with  type 2 diabetes mellitus ([place]) 07/31/2018  ? Allergic rhinitis 07/31/2018  ? Mild intermittent asthma, uncomplicated 09/98/3382  ? GERD (gastroesophageal reflux disease) 07/31/2018  ? S/P gastric bypass 07/31/2018  ? Migraine 07/31/2018  ? Anxiety 07/31/2018  ? Depression, major, single episode, complete remission ([place]) 07/31/2018  ? Insomnia 07/31/2018  ? Umbilical hernia 50/53/9767  ? Iron deficiency anemia 06/09/2018  ? Cervicogenic headache 05/12/2018  ? Low back pain 05/12/2018  ? Polyarthralgia 05/12/2018  ? ?Past Surgical History:  ?Procedure Laterality Date  ? BOWEL RESECTION    ? gasticbypass    ? GASTRIC BYPASS    ? KNEE ARTHROSCOPY    ? TOTAL HIP ARTHROPLASTY Left   ? ? ?Family History  ?Problem Relation Age of Onset  ? Heart disease Mother   ? Lung cancer Mother   ? Rheum arthritis Mother   ? Arthritis Mother   ? Cancer Mother   ? COPD Mother   ? Miscarriages / [place] Mother   ? Diabetes Father   ? Heart disease Father   ? Breast cancer Paternal Aunt   ? ? ?Medications- reviewed and updated ?Current Outpatient Medications  ?Medication Sig Dispense Refill  ? albuterol (VENTOLIN HFA) 108 (90 Base) MCG/ACT inhaler INHALE 2 PUFFS BY MOUTH EVERY 6 HOURS AS NEEDED FOR WHEEZE OR SHORTNESS OF BREATH 8.5 each 2  ? azelastine (ASTELIN) 0.1 % nasal spray Place 2 sprays into both nostrils 2 (two) times daily. Use in each nostril as directed 30 mL 5  ? blood glucose meter kit and supplies KIT Dispense based on patient and insurance preference. Use up to four times daily as directed. (FOR ICD-9 250.00, 250.01). 1 each 0  ? buPROPion (WELLBUTRIN SR) 150 MG 12 hr tablet Take 150 mg by mouth daily.    ? clonazePAM (KLONOPIN) 0.5 MG tablet Take 1 tablet (0.5 mg total) by mouth 2 (two) times daily as needed for anxiety. 60 tablet 5  ? Continuous Blood Gluc Receiver (FREESTYLE LIBRE READER) DEVI 1 Device by Does not apply route daily as needed (to check blood sugar). 1 Device 0  ? Continuous Blood Gluc Sensor  (FREESTYLE LIBRE SENSOR SYSTEM) MISC Apply 1 sensor to skin every 14 days. 2 each 11  ? lumateperone tosylate (CAPLYTA) 42 MG capsule     ? metFORMIN (GLUCOPHAGE) 1000 MG tablet TAKE 1 TABLET BY MOUTH TWICE A DAY WITH A MEAL 180 tablet 3  ? ondansetron (ZOFRAN-ODT) 4 MG disintegrating tablet Take 1 tablet (4 mg total) by mouth every 8 (eight) hours as needed for nausea or vomiting. 20 tablet 0  ? pantoprazole (PROTONIX) 40 MG tablet TAKE 1 TABLET BY MOUTH EVERY DAY IN THE EVENING 90 tablet 0  ? pregabalin (LYRICA) 150 MG capsule Take 1 capsule (150 mg total) by mouth 2 (two) times daily. 60 capsule 5  ? rosuvastatin (CRESTOR) 20 MG tablet Take 1 tablet (20 mg total) by mouth daily. 90 tablet 3  ? SUMAtriptan (TOSYMRA) 10 MG/ACT SOLN PLACE 1 SPRAY INTO THE NOSE EVERY HOUR AS NEEDED (MAXIMUM 3 SPRAYS IN 24 HOURS)  6 each 5  ? tirzepatide (MOUNJARO) 2.5 MG/0.5ML Pen Inject 2.5 mg into the skin once a week. 2 mL 0  ? tiZANidine (ZANAFLEX) 4 MG tablet TAKE ONE TABLET BY MOUTH THREE TIMES DAILY 90 tablet 0  ? traMADol (ULTRAM) 50 MG tablet Take 1 tablet (50 mg total) by mouth every 8 (eight) hours as needed. 90 tablet 5  ? zolpidem (AMBIEN) 5 MG tablet Take 1 tablet (5 mg total) by mouth at bedtime as needed for sleep. 30 tablet 5  ? zonisamide (ZONEGRAN) 100 MG capsule Take 1 capsule (100 mg total) by mouth daily. 30 capsule 5  ? QUEtiapine (SEROQUEL) 100 MG tablet Take by mouth.    ? ?No current facility-administered medications for this visit.  ? ? ?Allergies-reviewed and updated ?Allergies  ?Allergen Reactions  ? Ciprofloxacin Itching  ? ? ?Social History  ? ?Socioeconomic History  ? Marital status: Single  ?  Spouse name: Not on file  ? Number of children: 0  ? Years of education: 78  ? Highest education level: Associate degree: occupational, Hotel manager, or vocational program  ?Occupational History  ?  Employer: [employer]  ?Tobacco Use  ? Smoking status: Never  ? Smokeless tobacco: Never  ?Vaping Use  ? Vaping Use: Never  used  ?Substance and Sexual Activity  ? Alcohol use: Not Currently  ?  Comment: Occ  ? Drug use: Yes  ?  Types: Marijuana  ? Sexual activity: Not Currently  ?Other Topics Concern  ? Not on file  ?Social History Narrat

## 2021-05-24 NOTE — Patient Instructions (Signed)
It was very nice to see you today! ? ?We will start Mounjaro.  Please take 2.5 mg weekly for 4 weeks and then send me a message before we increase to the higher dose. ? ?We will restart her cholesterol medication as well.  We may need to get you in to see a different doctor for your pain but I would like to see if we can get records for your MRI soon. ? ?We will see you back in 1 year for your next physical.  Please come back in 3 months to recheck your A1c. ? ?Take care, ?Dr [name] ? ?PLEASE NOTE: ? ?If you had any lab tests please let us know if you have not heard back within a few days. You may see your results on mychart before we have a chance to review them but we will give you a call once they are reviewed by [place]. If we ordered any referrals today, please let us know if you have not heard from their office within the next week.  ? ?Please try these tips to maintain a healthy lifestyle: ? ?Eat at least 3 REAL meals and 1-2 snacks per day.  Aim for no more than 5 hours between eating.  If you eat breakfast, please do so within one hour of getting up.  ? ?Each meal should contain half fruits/vegetables, one quarter protein, and one quarter carbs (no bigger than a computer mouse) ? ?Cut down on sweet beverages. This includes juice, soda, and sweet tea.  ? ?Drink at least 1 glass of water with each meal and aim for at least 8 glasses per day ? ?Exercise at least 150 minutes every week.   ?

## 2021-05-24 NOTE — Assessment & Plan Note (Signed)
Patient has been following with pain management at [facility].  She has been seeing Dr. Nelva Bush.  She has had 2 MRIs done recently which she was told did not show much of anything.  She still has quite a bit of pain.  We do not have any records available to review today.  We will be increasing her tramadol to 150 mg in the morning and 300 mg at night.  Continue current dose of tramadol.  Advised her to follow-up with Dr. Nelva Bush soon.  May need referral to neurosurgery depending on results of MRI. ?

## 2021-05-26 ENCOUNTER — Ambulatory Visit: Payer: BC Managed Care – PPO | Admitting: Neurology

## 2021-06-05 ENCOUNTER — Telehealth: Payer: Self-pay | Admitting: *Deleted

## 2021-06-05 ENCOUNTER — Telehealth: Payer: Self-pay | Admitting: Gastroenterology

## 2021-06-05 NOTE — Telephone Encounter (Signed)
My apologizes Dr. Tarri Holland wanted to give you heads up, she was scheduled for a colonoscopy on 06/07/21.  ?

## 2021-06-05 NOTE — Telephone Encounter (Signed)
Patient called states she will be getting new insurance and would like to reschedule for some time in August. ?

## 2021-06-05 NOTE — Telephone Encounter (Signed)
This request has been approved using information available on the patient's profile. CaseId:[id number];Status:Approved;Review Type:Prior Auth;Coverage Start Date:05/06/2021;Coverage End Date:06/05/2022 ?Pharmacy notified  ?

## 2021-06-05 NOTE — Telephone Encounter (Signed)
Key: [id number] ?Rx #: [id number] ?Mounjaro 2.5MG /0.5ML pen-injectors ?Waiting for determination  ?

## 2021-06-06 NOTE — Telephone Encounter (Signed)
Noted. Thanks.

## 2021-06-07 ENCOUNTER — Encounter: Payer: 59 | Admitting: Gastroenterology

## 2021-06-11 ENCOUNTER — Other Ambulatory Visit: Payer: Self-pay | Admitting: Family Medicine

## 2021-07-06 ENCOUNTER — Encounter (HOSPITAL_COMMUNITY): Payer: Self-pay | Admitting: *Deleted

## 2021-07-06 ENCOUNTER — Ambulatory Visit (HOSPITAL_COMMUNITY)
Admission: EM | Admit: 2021-07-06 | Discharge: 2021-07-06 | Disposition: A | Discharge status: Home / Self Care | Payer: BC Managed Care – PPO | Attending: Internal Medicine | Admitting: Internal Medicine

## 2021-07-06 ENCOUNTER — Other Ambulatory Visit: Payer: Self-pay

## 2021-07-06 DIAGNOSIS — J0191 Acute recurrent sinusitis, unspecified: Secondary | ICD-10-CM

## 2021-07-06 DIAGNOSIS — J029 Acute pharyngitis, unspecified: Secondary | ICD-10-CM

## 2021-07-06 LAB — POCT INFECTIOUS MONO SCREEN, ED / UC: Mono Screen: NEGATIVE

## 2021-07-06 LAB — POCT RAPID STREP A, ED / UC: Streptococcus, Group A Screen (Direct): NEGATIVE

## 2021-07-06 MED ORDER — AMOXICILLIN-POT CLAVULANATE 875-125 MG PO TABS: 1.0000 | ORAL_TABLET | Freq: Two times a day (BID) | ORAL | 0 refills | Status: DC

## 2021-07-06 NOTE — ED Triage Notes (Signed)
Pt reports swollen neck gland on Rt with an abscess inside of mouth for 2 months.

## 2021-07-06 NOTE — ED Provider Notes (Signed)
[facility]    CSN: [id number] Arrival date & time: 07/06/21  1249      History   Chief Complaint Chief Complaint  Patient presents with   Lymphadenopathy   Abscess    HPI Heidi Holland is a 46 y.o. female.   Patient presents with persistent throat irritation and lymphadenopathy that has been present for approximately 2 months.  Patient reports that her coworkers have had similar symptoms as well.  When symptoms first started, she had large white pieces that she could remove from her right tonsil.  Then she noticed some lymph node swelling in the right side of her neck.  She did not have any pain to her throat at that time, but sore throat developed approximately 2 to 3 weeks ago.  She has also had some associated upper respiratory nasal congestion and sinus pressure.  Denies any known fevers.  Denies chest pain, shortness of breath, cough, nausea, vomiting, diarrhea, abdominal pain.  Patient has not seen a doctor since symptoms started.  She has not taken any medications for symptoms.   Abscess  Past Medical History:  Diagnosis Date   Allergy    Anxiety    Asthma    Depression    Diabetes mellitus without complication ([place])    Diverticulitis    GERD (gastroesophageal reflux disease)    HPV in female    Hyperlipidemia    Migraines     Patient Active Problem List   Diagnosis Date Noted   CIN I (cervical intraepithelial neoplasia I) 03/03/2021   LSIL pap smear of cervix/human papillomavirus (HPV) positive 10/24/2020   Family history of early CAD 10/24/2020   B12 deficiency 07/26/2020   Somatic dysfunction of spine, cervical 07/21/2020   Status post left hip replacement 03/12/2019   T2DM (type 2 diabetes mellitus) ([place]) 07/31/2018   Dyslipidemia associated with type 2 diabetes mellitus ([place]) 07/31/2018   Allergic rhinitis 07/31/2018   Mild intermittent asthma, uncomplicated 69/45/0388   GERD (gastroesophageal reflux disease) 07/31/2018   S/P gastric bypass  07/31/2018   Migraine 07/31/2018   Anxiety 07/31/2018   Depression, major, single episode, complete remission ([place]) 07/31/2018   Insomnia 82/80/0349   Umbilical hernia 17/91/5056   Iron deficiency anemia 06/09/2018   Cervicogenic headache 05/12/2018   Low back pain 05/12/2018   Polyarthralgia 05/12/2018    Past Surgical History:  Procedure Laterality Date   BOWEL RESECTION     gasticbypass     GASTRIC BYPASS     KNEE ARTHROSCOPY     TOTAL HIP ARTHROPLASTY Left     OB History     Gravida  0   Para  0   Term  0   Preterm  0   AB  0   Living  0      SAB  0   IAB  0   Ectopic  0   Multiple  0   Live Births  0            Home Medications    Prior to Admission medications   Medication Sig Start Date End Date Taking? Authorizing Provider  amoxicillin-clavulanate (AUGMENTIN) 875-125 MG tablet Take 1 tablet by mouth every 12 (twelve) hours. 07/06/21  Yes Tekisha Darcey, Hildred Alamin E, FNP  albuterol (VENTOLIN HFA) 108 (90 Base) MCG/ACT inhaler INHALE 2 PUFFS BY MOUTH EVERY 6 HOURS AS NEEDED FOR WHEEZE OR SHORTNESS OF BREATH 04/10/21   Vivi Barrack, MD  azelastine (ASTELIN) 0.1 % nasal spray Place 2 sprays  into both nostrils 2 (two) times daily. Use in each nostril as directed 07/26/20   Vivi Barrack, MD  blood glucose meter kit and supplies KIT Dispense based on patient and insurance preference. Use up to four times daily as directed. (FOR ICD-9 250.00, 250.01). 07/31/18   Vivi Barrack, MD  buPROPion [facility] SR) 150 MG 12 hr tablet Take 150 mg by mouth daily.    [provider]  clonazePAM (KLONOPIN) 0.5 MG tablet Take 1 tablet (0.5 mg total) by mouth 2 (two) times daily as needed for anxiety. 02/17/21   Vivi Barrack, MD  Continuous Blood Gluc Receiver (FREESTYLE LIBRE READER) DEVI 1 Device by Does not apply route daily as needed (to check blood sugar). 08/14/18   Vivi Barrack, MD  Continuous Blood Gluc Sensor (FREESTYLE LIBRE SENSOR SYSTEM) MISC Apply 1  sensor to skin every 14 days. 08/14/18   Vivi Barrack, MD  lumateperone tosylate (CAPLYTA) 42 MG capsule  01/05/21   [provider]  metFORMIN (GLUCOPHAGE) 1000 MG tablet TAKE 1 TABLET BY MOUTH TWICE A DAY WITH A MEAL 03/20/21   Vivi Barrack, MD  ondansetron (ZOFRAN-ODT) 4 MG disintegrating tablet Take 1 tablet (4 mg total) by mouth every 8 (eight) hours as needed for nausea or vomiting. 05/18/21   Vivi Barrack, MD  pantoprazole (PROTONIX) 40 MG tablet TAKE 1 TABLET BY MOUTH EVERY DAY IN THE EVENING 04/10/21   Vivi Barrack, MD  pregabalin (LYRICA) 150 MG capsule Take 1 capsule (150 mg total) by mouth 2 (two) times daily. 02/16/21   Vivi Barrack, MD  QUEtiapine (SEROQUEL) 100 MG tablet Take by mouth. 05/03/21   [provider]  rosuvastatin (CRESTOR) 20 MG tablet Take 1 tablet (20 mg total) by mouth daily. 05/24/21   Vivi Barrack, MD  SUMAtriptan (TOSYMRA) 10 MG/ACT SOLN PLACE 1 SPRAY INTO THE NOSE EVERY HOUR AS NEEDED (MAXIMUM 3 SPRAYS IN 24 HOURS) 03/15/21   Tomi Likens, Adam R, DO  tirzepatide [facility]) 2.5 MG/0.5ML Pen Inject 2.5 mg into the skin once a week. 05/24/21   Vivi Barrack, MD  tiZANidine (ZANAFLEX) 4 MG tablet TAKE ONE TABLET BY MOUTH THREE TIMES DAILY 06/12/21   Vivi Barrack, MD  traMADol (ULTRAM) 50 MG tablet Take 1 tablet (50 mg total) by mouth every 8 (eight) hours as needed. 02/23/21   Vivi Barrack, MD  zolpidem (AMBIEN) 5 MG tablet Take 1 tablet (5 mg total) by mouth at bedtime as needed for sleep. 04/18/21   Vivi Barrack, MD  zonisamide (ZONEGRAN) 100 MG capsule Take 1 capsule (100 mg total) by mouth daily. 04/17/21   Pieter Partridge, DO    Family History Family History  Problem Relation Age of Onset   Heart disease Mother    Lung cancer Mother    Rheum arthritis Mother    Arthritis Mother    Cancer Mother    COPD Mother    17 / [place] Mother    Diabetes Father    Heart disease Father    Breast cancer Paternal Aunt     Social  History Social History   Tobacco Use   Smoking status: Never   Smokeless tobacco: Never  Vaping Use   Vaping Use: Never used  Substance Use Topics   Alcohol use: Not Currently    Comment: Occ   Drug use: Yes    Types: Marijuana     Allergies   Ciprofloxacin  Review of Systems Review of Systems Per HPI  Physical Exam Triage Vital Signs ED Triage Vitals  Enc Vitals Group     BP 07/06/21 1342 121/87     Pulse Rate 07/06/21 1342 83     Resp 07/06/21 1342 18     Temp 07/06/21 1342 99 F (37.2 C)     Temp src --      SpO2 07/06/21 1342 99 %     Weight --      Height --      Head Circumference --      Peak Flow --      Pain Score 07/06/21 1340 6     Pain Loc --      Pain Edu? --      Excl. in [place]? --    No data found.  Updated Vital Signs BP 121/87   Pulse 83   Temp 99 F (37.2 C)   Resp 18   LMP 06/06/2021   SpO2 99%   Visual Acuity Right Eye Distance:   Left Eye Distance:   Bilateral Distance:    Right Eye Near:   Left Eye Near:    Bilateral Near:     Physical Exam Constitutional:      General: She is not in acute distress.    Appearance: Normal appearance. She is not toxic-appearing or diaphoretic.  HENT:     Head: Normocephalic and atraumatic.     Right Ear: Tympanic membrane and ear canal normal.     Left Ear: Tympanic membrane and ear canal normal.     Nose: Congestion present.     Mouth/Throat:     Mouth: Mucous membranes are moist.     Pharynx: Posterior oropharyngeal erythema present.  Eyes:     Extraocular Movements: Extraocular movements intact.     Conjunctiva/sclera: Conjunctivae normal.     Pupils: Pupils are equal, round, and reactive to light.  Cardiovascular:     Rate and Rhythm: Normal rate and regular rhythm.     Pulses: Normal pulses.     Heart sounds: Normal heart sounds.  Pulmonary:     Effort: Pulmonary effort is normal. No respiratory distress.     Breath sounds: Normal breath sounds. No stridor. No wheezing,  rhonchi or rales.  Abdominal:     General: Abdomen is flat. Bowel sounds are normal.     Palpations: Abdomen is soft.  Musculoskeletal:        General: Normal range of motion.     Cervical back: Normal range of motion.  Lymphadenopathy:     Cervical: No cervical adenopathy.  Skin:    General: Skin is warm and dry.  Neurological:     General: No focal deficit present.     Mental Status: She is alert and oriented to person, place, and time. Mental status is at baseline.  Psychiatric:        Mood and Affect: Mood normal.        Behavior: Behavior normal.     UC Treatments / Results  Labs (all labs ordered are listed, but only abnormal results are displayed) Labs Reviewed  CULTURE, GROUP A STREP [facility])  POCT RAPID STREP A, ED / UC  POCT INFECTIOUS MONO SCREEN, ED / UC    EKG   Radiology No results found.  Procedures Procedures (including critical care time)  Medications Ordered in UC Medications - No data to display  Initial Impression / Assessment and Plan / UC Course  I have  reviewed the triage vital signs and the nursing notes.  Pertinent labs & imaging results that were available during my care of the patient were reviewed by me and considered in my medical decision making (see chart for details).     Rapid strep was negative. Suspect that patient's symptoms could have started as viral and I also suspect that tonsil stones could have also been present at one time given patient descriptions. Advised patient of supportive care with gargling salt water with tonsil stones. . Throat culture is pending. Rapid mono test was negative. Sinus infection appears to be present. Will treat with augmentin. Patient advised to follow up if symptoms persist or worsen. Patient verbalized understanding and was agreeable with plan.  Final Clinical Impressions(s) / UC Diagnoses   Final diagnoses:  Sore throat  Acute recurrent sinusitis, unspecified location     Discharge  Instructions      You have been prescribed an antibiotic to treat possible sinus infection.  Rapid strep and rapid monotest were negative.  Throat culture is pending.  We will call if it is positive.  I am unable to prescribe yeast medication at this time as it may interact with your daily medications.  Please follow-up if you develop symptoms of yeast infection with primary care doctor or urgent care.     ED Prescriptions     Medication Sig Dispense Auth. Provider   amoxicillin-clavulanate (AUGMENTIN) 875-125 MG tablet Take 1 tablet by mouth every 12 (twelve) hours. 14 tablet [place], Michele Rockers, [place]      PDMP not reviewed this encounter.   Teodora Medici, [place] 07/06/21 [phone]

## 2021-07-06 NOTE — Discharge Instructions (Signed)
You have been prescribed an antibiotic to treat possible sinus infection.  Rapid strep and rapid monotest were negative.  Throat culture is pending.  We will call if it is positive.  I am unable to prescribe yeast medication at this time as it may interact with your daily medications.  Please follow-up if you develop symptoms of yeast infection with primary care doctor or urgent care.

## 2021-07-09 ENCOUNTER — Other Ambulatory Visit: Payer: Self-pay | Admitting: Family Medicine

## 2021-07-09 LAB — CULTURE, GROUP A STREP (THRC)

## 2021-07-11 ENCOUNTER — Other Ambulatory Visit: Payer: Self-pay | Admitting: Family Medicine

## 2021-07-12 ENCOUNTER — Other Ambulatory Visit: Payer: Self-pay | Admitting: Family Medicine

## 2021-07-14 ENCOUNTER — Telehealth: Payer: BC Managed Care – PPO | Admitting: Physician Assistant

## 2021-07-14 DIAGNOSIS — F3162 Bipolar disorder, current episode mixed, moderate: Secondary | ICD-10-CM | POA: Diagnosis not present

## 2021-07-14 DIAGNOSIS — B9689 Other specified bacterial agents as the cause of diseases classified elsewhere: Secondary | ICD-10-CM | POA: Diagnosis not present

## 2021-07-14 DIAGNOSIS — J40 Bronchitis, not specified as acute or chronic: Secondary | ICD-10-CM

## 2021-07-14 DIAGNOSIS — J019 Acute sinusitis, unspecified: Secondary | ICD-10-CM

## 2021-07-14 DIAGNOSIS — F411 Generalized anxiety disorder: Secondary | ICD-10-CM | POA: Diagnosis not present

## 2021-07-14 DIAGNOSIS — F4312 Post-traumatic stress disorder, chronic: Secondary | ICD-10-CM | POA: Diagnosis not present

## 2021-07-14 MED ORDER — PSEUDOEPH-BROMPHEN-DM 30-2-10 MG/5ML PO SYRP: 5.0000 mL | ORAL_SOLUTION | Freq: Four times a day (QID) | ORAL | 0 refills | Status: DC | PRN

## 2021-07-14 MED ORDER — PREDNISONE 20 MG PO TABS: 40.0000 mg | ORAL_TABLET | Freq: Every day | ORAL | 0 refills | Status: DC

## 2021-07-14 MED ORDER — DOXYCYCLINE HYCLATE 100 MG PO TABS: 100.0000 mg | ORAL_TABLET | Freq: Two times a day (BID) | ORAL | 0 refills | Status: DC

## 2021-07-14 NOTE — Patient Instructions (Signed)
[name], thank you for joining Mar Daring, PA-C for today's virtual visit.  While this provider is not your primary care provider (PCP), if your PCP is located in our provider database this encounter information will be shared with them immediately following your visit.  Consent: (Patient) Kenyatta Gloeckner provided verbal consent for this virtual visit at the beginning of the encounter.  Current Medications:  Current Outpatient Medications:    brompheniramine-pseudoephedrine-DM 30-2-10 MG/5ML syrup, Take 5 mLs by mouth 4 (four) times daily as needed., Disp: 120 mL, Rfl: 0   doxycycline (VIBRA-TABS) 100 MG tablet, Take 1 tablet (100 mg total) by mouth 2 (two) times daily., Disp: 20 tablet, Rfl: 0   predniSONE (DELTASONE) 20 MG tablet, Take 2 tablets (40 mg total) by mouth daily with breakfast., Disp: 10 tablet, Rfl: 0   albuterol (VENTOLIN HFA) 108 (90 Base) MCG/ACT inhaler, INHALE 2 PUFFS BY MOUTH EVERY 6 HOURS AS NEEDED FOR WHEEZE OR SHORTNESS OF BREATH, Disp: 8.5 each, Rfl: 2   azelastine (ASTELIN) 0.1 % nasal spray, Place 2 sprays into both nostrils 2 (two) times daily. Use in each nostril as directed, Disp: 30 mL, Rfl: 5   blood glucose meter kit and supplies KIT, Dispense based on patient and insurance preference. Use up to four times daily as directed. (FOR ICD-9 250.00, 250.01)., Disp: 1 each, Rfl: 0   buPROPion (WELLBUTRIN SR) 150 MG 12 hr tablet, Take 150 mg by mouth daily., Disp: , Rfl:    clonazePAM (KLONOPIN) 0.5 MG tablet, Take 1 tablet (0.5 mg total) by mouth 2 (two) times daily as needed for anxiety., Disp: 60 tablet, Rfl: 5   Continuous Blood Gluc Receiver (FREESTYLE LIBRE READER) DEVI, 1 Device by Does not apply route daily as needed (to check blood sugar)., Disp: 1 Device, Rfl: 0   Continuous Blood Gluc Sensor (FREESTYLE LIBRE SENSOR SYSTEM) MISC, Apply 1 sensor to skin every 14 days., Disp: 2 each, Rfl: 11   lumateperone tosylate (CAPLYTA) 42 MG capsule, , Disp: ,  Rfl:    metFORMIN (GLUCOPHAGE) 1000 MG tablet, TAKE 1 TABLET BY MOUTH TWICE A DAY WITH A MEAL, Disp: 180 tablet, Rfl: 3   MOUNJARO 2.5 MG/0.5ML Pen, Inject 2.5 mg into the skin once a week, Disp: 2 mL, Rfl: 0   ondansetron (ZOFRAN-ODT) 4 MG disintegrating tablet, TAKE 1 TABLET BY MOUTH EVERY 8 HOURS AS NEEDED FOR NAUSEA OR VOMITING, Disp: 20 tablet, Rfl: 0   pantoprazole (PROTONIX) 40 MG tablet, TAKE 1 TABLET BY MOUTH EVERY DAY IN THE EVENING, Disp: 90 tablet, Rfl: 0   pregabalin (LYRICA) 150 MG capsule, Take 1 capsule (150 mg total) by mouth 2 (two) times daily., Disp: 60 capsule, Rfl: 5   QUEtiapine (SEROQUEL) 100 MG tablet, Take by mouth., Disp: , Rfl:    rosuvastatin (CRESTOR) 20 MG tablet, Take 1 tablet (20 mg total) by mouth daily., Disp: 90 tablet, Rfl: 3   SUMAtriptan (TOSYMRA) 10 MG/ACT SOLN, PLACE 1 SPRAY INTO THE NOSE EVERY HOUR AS NEEDED (MAXIMUM 3 SPRAYS IN 24 HOURS), Disp: 6 each, Rfl: 5   tiZANidine (ZANAFLEX) 4 MG tablet, TAKE ONE TABLET BY MOUTH THREE TIMES DAILY, Disp: 90 tablet, Rfl: 0   traMADol (ULTRAM) 50 MG tablet, Take 1 tablet (50 mg total) by mouth every 8 (eight) hours as needed., Disp: 90 tablet, Rfl: 5   zolpidem (AMBIEN) 5 MG tablet, Take 1 tablet (5 mg total) by mouth at bedtime as needed for sleep., Disp: 30 tablet, Rfl: 5  zonisamide (ZONEGRAN) 100 MG capsule, Take 1 capsule (100 mg total) by mouth daily., Disp: 30 capsule, Rfl: 5   Medications ordered in this encounter:  Meds ordered this encounter  Medications   doxycycline (VIBRA-TABS) 100 MG tablet    Sig: Take 1 tablet (100 mg total) by mouth 2 (two) times daily.    Dispense:  20 tablet    Refill:  0    Order Specific Question:   Supervising Provider    Answer:   MILLER, BRIAN [[id number]]   predniSONE (DELTASONE) 20 MG tablet    Sig: Take 2 tablets (40 mg total) by mouth daily with breakfast.    Dispense:  10 tablet    Refill:  0    Order Specific Question:   Supervising Provider    Answer:   Sabra Heck,  BRIAN [[id number]]   brompheniramine-pseudoephedrine-DM 30-2-10 MG/5ML syrup    Sig: Take 5 mLs by mouth 4 (four) times daily as needed.    Dispense:  120 mL    Refill:  0    Order Specific Question:   Supervising Provider    Answer:   Sabra Heck, BRIAN [[id number]]     *If you need refills on other medications prior to your next appointment, please contact your pharmacy*  Follow-Up: Call back or seek an in-person evaluation if the symptoms worsen or if the condition fails to improve as anticipated.  Other Instructions Acute Bronchitis, Adult  Acute bronchitis is when air tubes in the lungs (bronchi) suddenly get swollen. The condition can make it hard for you to breathe. In adults, acute bronchitis usually goes away within 2 weeks. A cough caused by bronchitis may last up to 3 weeks. Smoking, allergies, and asthma can make the condition worse. What are the causes? Germs that cause cold and flu (viruses). The most common cause of this condition is the virus that causes the common cold. Bacteria. Substances that bother (irritate) the lungs, including: Smoke from cigarettes and other types of tobacco. Dust and pollen. Fumes from chemicals, gases, or burned fuel. Indoor or outdoor air pollution. What increases the risk? A weak body's defense system. This is also called the immune system. Any condition that affects your lungs and breathing, such as asthma. What are the signs or symptoms? A cough. Coughing up clear, yellow, or green mucus. Making high-pitched whistling sounds when you breathe, most often when you breathe out (wheezing). Runny or stuffy nose. Having too much mucus in your lungs (chest congestion). Shortness of breath. Body aches. A sore throat. How is this treated? Acute bronchitis may go away over time without treatment. Your doctor may tell you to: Drink more fluids. This will help thin your mucus so it is easier to cough up. Use a device that gets medicine into your lungs  (inhaler). Use a vaporizer or a humidifier. These are machines that add water to the air. This helps with coughing and poor breathing. Take a medicine that thins mucus and helps clear it from your lungs. Take a medicine that prevents or stops coughing. It is not common to take an antibiotic medicine for this condition. Follow these instructions at home:  Take over-the-counter and prescription medicines only as told by your doctor. Use an inhaler, vaporizer, or humidifier as told by your doctor. Take two teaspoons (10 mL) of honey at bedtime. This helps lessen your coughing at night. Drink enough fluid to keep your pee (urine) pale yellow. Do not smoke or use any products that contain nicotine or tobacco. If  you need help quitting, ask your doctor. Get a lot of rest. Return to your normal activities when your doctor says that it is safe. Keep all follow-up visits. How is this prevented?  Wash your hands often with soap and water for at least 20 seconds. If you cannot use soap and water, use hand sanitizer. Avoid contact with people who have cold symptoms. Try not to touch your mouth, nose, or eyes with your hands. Avoid breathing in smoke or chemical fumes. Make sure to get the flu shot every year. Contact a doctor if: Your symptoms do not get better in 2 weeks. You have trouble coughing up the mucus. Your cough keeps you awake at night. You have a fever. Get help right away if: You cough up blood. You have chest pain. You have very bad shortness of breath. You faint or keep feeling like you are going to faint. You have a very bad headache. Your fever or chills get worse. These symptoms may be an emergency. Get help right away. Call your local emergency services (911 in the [place] Do not wait to see if the symptoms will go away. Do not drive yourself to the hospital. Summary Acute bronchitis is when air tubes in the lungs (bronchi) suddenly get swollen. In adults, acute  bronchitis usually goes away within 2 weeks. Drink more fluids. This will help thin your mucus so it is easier to cough up. Take over-the-counter and prescription medicines only as told by your doctor. Contact a doctor if your symptoms do not improve after 2 weeks of treatment. This information is not intended to replace advice given to you by your health care provider. Make sure you discuss any questions you have with your health care provider. Document Revised: 06/01/2020 Document Reviewed: 06/01/2020 Elsevier Patient Education  [place]. Sinus Infection, Adult A sinus infection, also called sinusitis, is inflammation of your sinuses. Sinuses are hollow spaces in the bones around your face. Your sinuses are located: Around your eyes. In the middle of your forehead. Behind your nose. In your cheekbones. Mucus normally drains out of your sinuses. When your nasal tissues become inflamed or swollen, mucus can become trapped or blocked. This allows bacteria, viruses, and fungi to grow, which leads to infection. Most infections of the sinuses are caused by a virus. A sinus infection can develop quickly. It can last for up to 4 weeks (acute) or for more than 12 weeks (chronic). A sinus infection often develops after a cold. What are the causes? This condition is caused by anything that creates swelling in the sinuses or stops mucus from draining. This includes: Allergies. Asthma. Infection from bacteria or viruses. Deformities or blockages in your nose or sinuses. Abnormal growths in the nose (nasal polyps). Pollutants, such as chemicals or irritants in the air. Infection from fungi. This is rare. What increases the risk? You are more likely to develop this condition if you: Have a weak body defense system (immune system). Do a lot of swimming or diving. Overuse nasal sprays. Smoke. What are the signs or symptoms? The main symptoms of this condition are pain and a feeling of  pressure around the affected sinuses. Other symptoms include: Stuffy nose or congestion that makes it difficult to breathe through your nose. Thick yellow or greenish drainage from your nose. Tenderness, swelling, and warmth over the affected sinuses. A cough that may get worse at night. Decreased sense of smell and taste. Extra mucus that collects in the throat or the  back of the nose (postnasal drip) causing a sore throat or bad breath. Tiredness (fatigue). Fever. How is this diagnosed? This condition is diagnosed based on: Your symptoms. Your medical history. A physical exam. Tests to find out if your condition is acute or chronic. This may include: Checking your nose for nasal polyps. Viewing your sinuses using a device that has a light (endoscope). Testing for allergies or bacteria. Imaging tests, such as an MRI or CT scan. In rare cases, a bone biopsy may be done to rule out more serious types of fungal sinus disease. How is this treated? Treatment for a sinus infection depends on the cause and whether your condition is chronic or acute. If caused by a virus, your symptoms should go away on their own within 10 days. You may be given medicines to relieve symptoms. They include: Medicines that shrink swollen nasal passages (decongestants). A spray that eases inflammation of the nostrils (topical intranasal corticosteroids). Rinses that help get rid of thick mucus in your nose (nasal saline washes). Medicines that treat allergies (antihistamines). Over-the-counter pain relievers. If caused by bacteria, your health care provider may recommend waiting to see if your symptoms improve. Most bacterial infections will get better without antibiotic medicine. You may be given antibiotics if you have: A severe infection. A weak immune system. If caused by narrow nasal passages or nasal polyps, surgery may be needed. Follow these instructions at home: Medicines Take, use, or apply  over-the-counter and prescription medicines only as told by your health care provider. These may include nasal sprays. If you were prescribed an antibiotic medicine, take it as told by your health care provider. Do not stop taking the antibiotic even if you start to feel better. Hydrate and humidify  Drink enough fluid to keep your urine pale yellow. Staying hydrated will help to thin your mucus. Use a cool mist humidifier to keep the humidity level in your home above 50%. Inhale steam for 10-15 minutes, 3-4 times a day, or as told by your health care provider. You can do this in the bathroom while a hot shower is running. Limit your exposure to cool or dry air. Rest Rest as much as possible. Sleep with your head raised (elevated). Make sure you get enough sleep each night. General instructions  Apply a warm, moist washcloth to your face 3-4 times a day or as told by your health care provider. This will help with discomfort. Use nasal saline washes as often as told by your health care provider. Wash your hands often with soap and water to reduce your exposure to germs. If soap and water are not available, use hand sanitizer. Do not smoke. Avoid being around people who are smoking (secondhand smoke). Keep all follow-up visits. This is important. Contact a health care provider if: You have a fever. Your symptoms get worse. Your symptoms do not improve within 10 days. Get help right away if: You have a severe headache. You have persistent vomiting. You have severe pain or swelling around your face or eyes. You have vision problems. You develop confusion. Your neck is stiff. You have trouble breathing. These symptoms may be an emergency. Get help right away. Call 911. Do not wait to see if the symptoms will go away. Do not drive yourself to the hospital. Summary A sinus infection is soreness and inflammation of your sinuses. Sinuses are hollow spaces in the bones around your  face. This condition is caused by nasal tissues that become inflamed or swollen.  The swelling traps or blocks the flow of mucus. This allows bacteria, viruses, and fungi to grow, which leads to infection. If you were prescribed an antibiotic medicine, take it as told by your health care provider. Do not stop taking the antibiotic even if you start to feel better. Keep all follow-up visits. This is important. This information is not intended to replace advice given to you by your health care provider. Make sure you discuss any questions you have with your health care provider. Document Revised: 01/03/2021 Document Reviewed: 01/03/2021 Elsevier Patient Education  [place].    If you have been instructed to have an in-person evaluation today at a local Urgent Care facility, please use the link below. It will take you to a list of all of our available [facility], including address, phone number and hours of operation. Please do not delay care.  [facility]  If you or a family member do not have a primary care provider, use the link below to schedule a visit and establish care. When you choose a [facility] primary care physician or advanced practice provider, you gain a long-term partner in health. Find a Primary Care Provider  Learn more about [facility]'s in-office and virtual care options: [facility]

## 2021-07-14 NOTE — Progress Notes (Signed)
Virtual Visit Consent   Heidi Holland, you are scheduled for a virtual visit with a [facility] provider today. Just as with appointments in the office, your consent must be obtained to participate. Your consent will be active for this visit and any virtual visit you may have with one of our providers in the next 365 days. If you have a MyChart account, a copy of this consent can be sent to you electronically.  As this is a virtual visit, video technology does not allow for your provider to perform a traditional examination. This may limit your provider's ability to fully assess your condition. If your provider identifies any concerns that need to be evaluated in person or the need to arrange testing (such as labs, EKG, etc.), we will make arrangements to do so. Although advances in technology are sophisticated, we cannot ensure that it will always work on either your end or our end. If the connection with a video visit is poor, the visit may have to be switched to a telephone visit. With either a video or telephone visit, we are not always able to ensure that we have a secure connection.  By engaging in this virtual visit, you consent to the provision of healthcare and authorize for your insurance to be billed (if applicable) for the services provided during this visit. Depending on your insurance coverage, you may receive a charge related to this service.  I need to obtain your verbal consent now. Are you willing to proceed with your visit today? Heidi Holland has provided verbal consent on 07/14/2021 for a virtual visit (video or telephone). Mar Daring, PA-C  Date: 07/14/2021 10:49 AM  Virtual Visit via Video Note   I, Mar Daring, connected with  Heidi Holland  ([id number], [birth year]) on 07/14/21 at 10:45 AM EDT by a video-enabled telemedicine application and verified that I am speaking with the correct person using two identifiers.  Location: Patient: Virtual Visit Location Patient:  Home Provider: Virtual Visit Location Provider: Home Office   I discussed the limitations of evaluation and management by telemedicine and the availability of in person appointments. The patient expressed understanding and agreed to proceed.    History of Present Illness: Heidi Holland is a 46 y.o. who identifies as a female who was assigned female at birth, and is being seen today for URI symptoms.  HPI: URI  This is a recurrent problem. The current episode started 1 to 4 weeks ago (seen at [facility] on 07/06/21, placed on Augmentin. Has had no improvements in symptoms and feels she has started to worsen again recently). The problem has been gradually worsening. There has been no fever. Associated symptoms include congestion, coughing, headaches, rhinorrhea, sinus pain, a sore throat and swollen glands. Pertinent negatives include no chest pain, ear pain or plugged ear sensation. Treatments tried: augmentin. The treatment provided no relief.     Problems:  Patient Active Problem List   Diagnosis Date Noted   CIN I (cervical intraepithelial neoplasia I) 03/03/2021   LSIL pap smear of cervix/human papillomavirus (HPV) positive 10/24/2020   Family history of early CAD 10/24/2020   B12 deficiency 07/26/2020   Somatic dysfunction of spine, cervical 07/21/2020   Status post left hip replacement 03/12/2019   T2DM (type 2 diabetes mellitus) ([place]) 07/31/2018   Dyslipidemia associated with type 2 diabetes mellitus ([place]) 07/31/2018   Allergic rhinitis 07/31/2018   Mild intermittent asthma, uncomplicated 51/03/5850   GERD (gastroesophageal reflux disease) 07/31/2018   S/P gastric bypass  07/31/2018   Migraine 07/31/2018   Anxiety 07/31/2018   Depression, major, single episode, complete remission ([place]) 07/31/2018   Insomnia 16/11/9602   Umbilical hernia 54/10/8117   Iron deficiency anemia 06/09/2018   Cervicogenic headache 05/12/2018   Low back pain 05/12/2018   Polyarthralgia 05/12/2018     Allergies:  Allergies  Allergen Reactions   Ciprofloxacin Itching   Medications:  Current Outpatient Medications:    brompheniramine-pseudoephedrine-DM 30-2-10 MG/5ML syrup, Take 5 mLs by mouth 4 (four) times daily as needed., Disp: 120 mL, Rfl: 0   doxycycline (VIBRA-TABS) 100 MG tablet, Take 1 tablet (100 mg total) by mouth 2 (two) times daily., Disp: 20 tablet, Rfl: 0   predniSONE (DELTASONE) 20 MG tablet, Take 2 tablets (40 mg total) by mouth daily with breakfast., Disp: 10 tablet, Rfl: 0   albuterol (VENTOLIN HFA) 108 (90 Base) MCG/ACT inhaler, INHALE 2 PUFFS BY MOUTH EVERY 6 HOURS AS NEEDED FOR WHEEZE OR SHORTNESS OF BREATH, Disp: 8.5 each, Rfl: 2   azelastine (ASTELIN) 0.1 % nasal spray, Place 2 sprays into both nostrils 2 (two) times daily. Use in each nostril as directed, Disp: 30 mL, Rfl: 5   blood glucose meter kit and supplies KIT, Dispense based on patient and insurance preference. Use up to four times daily as directed. (FOR ICD-9 250.00, 250.01)., Disp: 1 each, Rfl: 0   buPROPion (WELLBUTRIN SR) 150 MG 12 hr tablet, Take 150 mg by mouth daily., Disp: , Rfl:    clonazePAM (KLONOPIN) 0.5 MG tablet, Take 1 tablet (0.5 mg total) by mouth 2 (two) times daily as needed for anxiety., Disp: 60 tablet, Rfl: 5   Continuous Blood Gluc Receiver (FREESTYLE LIBRE READER) DEVI, 1 Device by Does not apply route daily as needed (to check blood sugar)., Disp: 1 Device, Rfl: 0   Continuous Blood Gluc Sensor (FREESTYLE LIBRE SENSOR SYSTEM) MISC, Apply 1 sensor to skin every 14 days., Disp: 2 each, Rfl: 11   lumateperone tosylate (CAPLYTA) 42 MG capsule, , Disp: , Rfl:    metFORMIN (GLUCOPHAGE) 1000 MG tablet, TAKE 1 TABLET BY MOUTH TWICE A DAY WITH A MEAL, Disp: 180 tablet, Rfl: 3   MOUNJARO 2.5 MG/0.5ML Pen, Inject 2.5 mg into the skin once a week, Disp: 2 mL, Rfl: 0   ondansetron (ZOFRAN-ODT) 4 MG disintegrating tablet, TAKE 1 TABLET BY MOUTH EVERY 8 HOURS AS NEEDED FOR NAUSEA OR VOMITING, Disp:  20 tablet, Rfl: 0   pantoprazole (PROTONIX) 40 MG tablet, TAKE 1 TABLET BY MOUTH EVERY DAY IN THE EVENING, Disp: 90 tablet, Rfl: 0   pregabalin (LYRICA) 150 MG capsule, Take 1 capsule (150 mg total) by mouth 2 (two) times daily., Disp: 60 capsule, Rfl: 5   QUEtiapine (SEROQUEL) 100 MG tablet, Take by mouth., Disp: , Rfl:    rosuvastatin (CRESTOR) 20 MG tablet, Take 1 tablet (20 mg total) by mouth daily., Disp: 90 tablet, Rfl: 3   SUMAtriptan (TOSYMRA) 10 MG/ACT SOLN, PLACE 1 SPRAY INTO THE NOSE EVERY HOUR AS NEEDED (MAXIMUM 3 SPRAYS IN 24 HOURS), Disp: 6 each, Rfl: 5   tiZANidine (ZANAFLEX) 4 MG tablet, TAKE ONE TABLET BY MOUTH THREE TIMES DAILY, Disp: 90 tablet, Rfl: 0   traMADol (ULTRAM) 50 MG tablet, Take 1 tablet (50 mg total) by mouth every 8 (eight) hours as needed., Disp: 90 tablet, Rfl: 5   zolpidem (AMBIEN) 5 MG tablet, Take 1 tablet (5 mg total) by mouth at bedtime as needed for sleep., Disp: 30 tablet, Rfl: 5  zonisamide (ZONEGRAN) 100 MG capsule, Take 1 capsule (100 mg total) by mouth daily., Disp: 30 capsule, Rfl: 5  Observations/Objective: Patient is well-developed, well-nourished in no acute distress.  Resting comfortably at home.  Head is normocephalic, atraumatic.  No labored breathing.  Speech is clear and coherent with logical content.  Patient is alert and oriented at baseline.    Assessment and Plan: 1. Acute bacterial sinusitis - doxycycline (VIBRA-TABS) 100 MG tablet; Take 1 tablet (100 mg total) by mouth 2 (two) times daily.  Dispense: 20 tablet; Refill: 0 - predniSONE (DELTASONE) 20 MG tablet; Take 2 tablets (40 mg total) by mouth daily with breakfast.  Dispense: 10 tablet; Refill: 0 - brompheniramine-pseudoephedrine-DM 30-2-10 MG/5ML syrup; Take 5 mLs by mouth 4 (four) times daily as needed.  Dispense: 120 mL; Refill: 0  2. Bronchitis - predniSONE (DELTASONE) 20 MG tablet; Take 2 tablets (40 mg total) by mouth daily with breakfast.  Dispense: 10 tablet; Refill:  0 - brompheniramine-pseudoephedrine-DM 30-2-10 MG/5ML syrup; Take 5 mLs by mouth 4 (four) times daily as needed.  Dispense: 120 mL; Refill: 0  - Worsening symptoms that have not responded to OTC medications.  - Will give Doxycycline, Prednisone, and Bromfed DM - Continue allergy medications.  - Steam and humidifier can help - Stay well hydrated and get plenty of rest.  - Seek in person evaluation if no symptom improvement or if symptoms worsen  Follow Up Instructions: I discussed the assessment and treatment plan with the patient. The patient was provided an opportunity to ask questions and all were answered. The patient agreed with the plan and demonstrated an understanding of the instructions.  A copy of instructions were sent to the patient via MyChart unless otherwise noted below.    The patient was advised to call back or seek an in-person evaluation if the symptoms worsen or if the condition fails to improve as anticipated.  Time:  I spent 8 minutes with the patient via telehealth technology discussing the above problems/concerns.    Mar Daring, PA-C

## 2021-07-26 ENCOUNTER — Telehealth: Payer: Self-pay | Admitting: Family Medicine

## 2021-07-26 NOTE — Telephone Encounter (Signed)
Patient called to ask if Dr. [name] could put in a referral for her to pain management. States she was previously seeing Dr. Ethelene Hal at [facility] but hasn't been able to get scheduled there due to owing a copay at this time. I offered for her to make an OV but she refused and stated she was told by Dr. [name] to just call if this was needed.

## 2021-07-26 NOTE — Telephone Encounter (Signed)
Patient called in to request an increased dosage of MOUNJARO 2.5 MG/0.5ML Pen to be called in. She is set to run out in early July. I have offered for her to make an OV but again she refused and stated she was told by Dr. [name] that she could just request this via telephone. Please Advise.

## 2021-07-27 ENCOUNTER — Other Ambulatory Visit: Payer: Self-pay | Admitting: *Deleted

## 2021-07-27 DIAGNOSIS — G8929 Other chronic pain: Secondary | ICD-10-CM

## 2021-07-27 NOTE — Telephone Encounter (Signed)
Referral Pain management placed

## 2021-07-29 NOTE — Telephone Encounter (Signed)
Ok with me. Please place any necessary orders. 

## 2021-07-31 NOTE — Telephone Encounter (Signed)
Please advise 

## 2021-07-31 NOTE — Telephone Encounter (Signed)
Patient has called back in regarding this.   Would like 5mg  dose to be sent to [facility].

## 2021-08-02 ENCOUNTER — Other Ambulatory Visit: Payer: Self-pay | Admitting: *Deleted

## 2021-08-02 MED ORDER — TIRZEPATIDE 5 MG/0.5ML ~~LOC~~ SOAJ: 5.0000 mg | SUBCUTANEOUS | 1 refills | Status: DC

## 2021-08-02 NOTE — Telephone Encounter (Signed)
Rx send to [facility] pharmacy  

## 2021-08-02 NOTE — Telephone Encounter (Signed)
Ok with me. Please place any necessary orders. 

## 2021-08-07 ENCOUNTER — Other Ambulatory Visit: Payer: Self-pay | Admitting: Family Medicine

## 2021-08-11 ENCOUNTER — Other Ambulatory Visit: Payer: Self-pay

## 2021-08-11 ENCOUNTER — Encounter (HOSPITAL_COMMUNITY): Payer: Self-pay | Admitting: *Deleted

## 2021-08-11 ENCOUNTER — Ambulatory Visit (HOSPITAL_COMMUNITY)
Admission: EM | Admit: 2021-08-11 | Discharge: 2021-08-11 | Disposition: A | Discharge status: Home / Self Care | Payer: BC Managed Care – PPO | Attending: Student | Admitting: Student

## 2021-08-11 DIAGNOSIS — F3162 Bipolar disorder, current episode mixed, moderate: Secondary | ICD-10-CM | POA: Diagnosis not present

## 2021-08-11 DIAGNOSIS — G43811 Other migraine, intractable, with status migrainosus: Secondary | ICD-10-CM

## 2021-08-11 DIAGNOSIS — F411 Generalized anxiety disorder: Secondary | ICD-10-CM | POA: Diagnosis not present

## 2021-08-11 DIAGNOSIS — F4312 Post-traumatic stress disorder, chronic: Secondary | ICD-10-CM | POA: Diagnosis not present

## 2021-08-11 MED ORDER — METHYLPREDNISOLONE SODIUM SUCC 125 MG IJ SOLR
80.0000 mg | Freq: Once | INTRAMUSCULAR | Status: AC
Start: 2021-08-11 — End: 2021-08-11
  Administered 2021-08-11: 80 mg via INTRAMUSCULAR

## 2021-08-11 MED ORDER — METHYLPREDNISOLONE SODIUM SUCC 125 MG IJ SOLR
INTRAMUSCULAR | Status: AC
  Filled 2021-08-11: qty 2

## 2021-08-11 NOTE — ED Triage Notes (Signed)
T reports she has a migraine fo r5 days.

## 2021-08-11 NOTE — Discharge Instructions (Addendum)
-  Continue prescribed migraine medications as directed  -Head to ED if unusual or new symptoms like weakness, vision changes, vision loss, worst headache of life, etc.

## 2021-08-11 NOTE — ED Provider Notes (Signed)
[place]    CSN: [id number] Arrival date & time: 08/11/21  1039      History   Chief Complaint Chief Complaint  Patient presents with   Migraine    HPI Courtney Bellizzi is a 46 y.o. female presenting with migraine x5 days. History migraine headaches. Describes throbbing pain with photophobia. Not relieved by daily zonegran and migraine nasal spray (unsure of name). States these are her typical migraine symptoms, she typically requires a shot of prednisone to resolve the migraine. Denies worst headache of life, thunderclap headache, weakness/sensation changes in arms/legs, vision changes, shortness of breath, chest pain/pressure, phonophobia, n/v/d.    HPI  Past Medical History:  Diagnosis Date   Allergy    Anxiety    Asthma    Depression    Diabetes mellitus without complication ([place])    Diverticulitis    GERD (gastroesophageal reflux disease)    HPV in female    Hyperlipidemia    Migraines     Patient Active Problem List   Diagnosis Date Noted   CIN I (cervical intraepithelial neoplasia I) 03/03/2021   LSIL pap smear of cervix/human papillomavirus (HPV) positive 10/24/2020   Family history of early CAD 10/24/2020   B12 deficiency 07/26/2020   Somatic dysfunction of spine, cervical 07/21/2020   Status post left hip replacement 03/12/2019   T2DM (type 2 diabetes mellitus) ([place]) 07/31/2018   Dyslipidemia associated with type 2 diabetes mellitus ([place]) 07/31/2018   Allergic rhinitis 07/31/2018   Mild intermittent asthma, uncomplicated 94/70/9628   GERD (gastroesophageal reflux disease) 07/31/2018   S/P gastric bypass 07/31/2018   Migraine 07/31/2018   Anxiety 07/31/2018   Depression, major, single episode, complete remission ([place]) 07/31/2018   Insomnia 36/62/9476   Umbilical hernia 54/65/0354   Iron deficiency anemia 06/09/2018   Cervicogenic headache 05/12/2018   Low back pain 05/12/2018   Polyarthralgia 05/12/2018    Past Surgical History:   Procedure Laterality Date   BOWEL RESECTION     gasticbypass     GASTRIC BYPASS     KNEE ARTHROSCOPY     TOTAL HIP ARTHROPLASTY Left     OB History     Gravida  0   Para  0   Term  0   Preterm  0   AB  0   Living  0      SAB  0   IAB  0   Ectopic  0   Multiple  0   Live Births  0            Home Medications    Prior to Admission medications   Medication Sig Start Date End Date Taking? Authorizing Provider  albuterol (VENTOLIN HFA) 108 (90 Base) MCG/ACT inhaler INHALE 2 PUFFS BY MOUTH EVERY 6 HOURS AS NEEDED FOR WHEEZE OR SHORTNESS OF BREATH 04/10/21   Vivi Barrack, MD  azelastine (ASTELIN) 0.1 % nasal spray Place 2 sprays into both nostrils 2 (two) times daily. Use in each nostril as directed 07/26/20   Vivi Barrack, MD  blood glucose meter kit and supplies KIT Dispense based on patient and insurance preference. Use up to four times daily as directed. (FOR ICD-9 250.00, 250.01). 07/31/18   Vivi Barrack, MD  brompheniramine-pseudoephedrine-DM 30-2-10 MG/5ML syrup Take 5 mLs by mouth 4 (four) times daily as needed. 07/14/21   Mar Daring, PA-C  buPROPion (WELLBUTRIN SR) 150 MG 12 hr tablet Take 150 mg by mouth daily.    [provider]  clonazePAM (KLONOPIN) 0.5 MG tablet Take 1 tablet (0.5 mg total) by mouth 2 (two) times daily as needed for anxiety. 02/17/21   Vivi Barrack, MD  Continuous Blood Gluc Receiver (FREESTYLE LIBRE READER) DEVI 1 Device by Does not apply route daily as needed (to check blood sugar). 08/14/18   Vivi Barrack, MD  Continuous Blood Gluc Sensor (FREESTYLE LIBRE SENSOR SYSTEM) MISC Apply 1 sensor to skin every 14 days. 08/14/18   Vivi Barrack, MD  doxycycline (VIBRA-TABS) 100 MG tablet Take 1 tablet (100 mg total) by mouth 2 (two) times daily. 07/14/21   Mar Daring, PA-C  lumateperone tosylate (CAPLYTA) 42 MG capsule  01/05/21   [provider]  metFORMIN (GLUCOPHAGE) 1000 MG tablet TAKE 1 TABLET BY  MOUTH TWICE A DAY WITH A MEAL 03/20/21   Vivi Barrack, MD  ondansetron (ZOFRAN-ODT) 4 MG disintegrating tablet TAKE 1 TABLET BY MOUTH EVERY 8 HOURS AS NEEDED FOR NAUSEA OR VOMITING 07/12/21   Vivi Barrack, MD  pantoprazole (PROTONIX) 40 MG tablet TAKE 1 TABLET BY MOUTH EVERY DAY IN THE EVENING 07/10/21   Vivi Barrack, MD  pregabalin (LYRICA) 150 MG capsule Take 1 capsule (150 mg total) by mouth 2 (two) times daily. 02/16/21   Vivi Barrack, MD  QUEtiapine (SEROQUEL) 100 MG tablet Take by mouth. 05/03/21   [provider]  rosuvastatin (CRESTOR) 20 MG tablet Take 1 tablet (20 mg total) by mouth daily. 05/24/21   Vivi Barrack, MD  SUMAtriptan (TOSYMRA) 10 MG/ACT SOLN PLACE 1 SPRAY INTO THE NOSE EVERY HOUR AS NEEDED (MAXIMUM 3 SPRAYS IN 24 HOURS) 03/15/21   Tomi Likens, Adam R, DO  tirzepatide [facility]) 5 MG/0.5ML Pen Inject 5 mg into the skin once a week. 08/02/21   Vivi Barrack, MD  tiZANidine (ZANAFLEX) 4 MG tablet TAKE ONE TABLET BY MOUTH THREE TIMES DAILY 08/07/21   Vivi Barrack, MD  traMADol (ULTRAM) 50 MG tablet Take 1 tablet (50 mg total) by mouth every 8 (eight) hours as needed. 02/23/21   Vivi Barrack, MD  zolpidem (AMBIEN) 5 MG tablet Take 1 tablet (5 mg total) by mouth at bedtime as needed for sleep. 04/18/21   Vivi Barrack, MD  zonisamide (ZONEGRAN) 100 MG capsule Take 1 capsule (100 mg total) by mouth daily. 04/17/21   Pieter Partridge, DO    Family History Family History  Problem Relation Age of Onset   Heart disease Mother    Lung cancer Mother    Rheum arthritis Mother    Arthritis Mother    Cancer Mother    COPD Mother    50 / [place] Mother    Diabetes Father    Heart disease Father    Breast cancer Paternal Aunt     Social History Social History   Tobacco Use   Smoking status: Never   Smokeless tobacco: Never  Vaping Use   Vaping Use: Never used  Substance Use Topics   Alcohol use: Not Currently    Comment: Occ   Drug use: Yes     Types: Marijuana     Allergies   Ciprofloxacin   Review of Systems Review of Systems  Eyes:  Positive for photophobia.  Neurological:  Positive for headaches.  All other systems reviewed and are negative.    Physical Exam Triage Vital Signs ED Triage Vitals  Enc Vitals Group     BP 08/11/21 1135 115/70     Pulse Rate  08/11/21 1135 77     Resp 08/11/21 1135 18     Temp 08/11/21 1135 98.6 F (37 C)     Temp Source 08/11/21 1135 Oral     SpO2 08/11/21 1135 99 %     Weight --      Height --      Head Circumference --      Peak Flow --      Pain Score 08/11/21 1132 10     Pain Loc --      Pain Edu? --      Excl. in [place]? --    No data found.  Updated Vital Signs BP 115/70   Pulse 77   Temp 98.6 F (37 C) (Oral)   Resp 18   LMP 08/11/2021   SpO2 99%   Visual Acuity Right Eye Distance:   Left Eye Distance:   Bilateral Distance:    Right Eye Near:   Left Eye Near:    Bilateral Near:     Physical Exam Vitals reviewed.  Constitutional:      General: She is not in acute distress.    Appearance: Normal appearance. She is not ill-appearing.  HENT:     Head: Normocephalic and atraumatic.  Eyes:     Extraocular Movements: Extraocular movements intact.     Pupils: Pupils are equal, round, and reactive to light.  Cardiovascular:     Rate and Rhythm: Normal rate and regular rhythm.     Heart sounds: Normal heart sounds.  Pulmonary:     Effort: Pulmonary effort is normal.     Breath sounds: Normal breath sounds. No wheezing, rhonchi or rales.  Musculoskeletal:     Cervical back: Normal range of motion and neck supple. No rigidity.  Lymphadenopathy:     Cervical: No cervical adenopathy.  Skin:    Capillary Refill: Capillary refill takes less than 2 seconds.  Neurological:     General: No focal deficit present.     Mental Status: She is alert and oriented to person, place, and time. Mental status is at baseline.     Cranial Nerves: No cranial nerve deficit or  facial asymmetry.     Sensory: Sensation is intact. No sensory deficit.     Motor: Motor function is intact. No weakness.     Coordination: Coordination is intact. Coordination normal.     Gait: Gait is intact. Gait normal.     Comments: PERRLA, EOMI. CN 2-12 intact. No weakness or numbness in UEs or LEs. Negative fingers to thumb, rhomberg, pronator drift.   Psychiatric:        Mood and Affect: Mood normal.        Behavior: Behavior normal.        Thought Content: Thought content normal.        Judgment: Judgment normal.      UC Treatments / Results  Labs (all labs ordered are listed, but only abnormal results are displayed) Labs Reviewed - No data to display  EKG   Radiology No results found.  Procedures Procedures (including critical care time)  Medications Ordered in UC Medications  methylPREDNISolone sodium succinate (SOLU-MEDROL) 125 mg/2 mL injection 80 mg (80 mg Intramuscular Given 08/11/21 1151)    Initial Impression / Assessment and Plan / UC Course  I have reviewed the triage vital signs and the nursing notes.  Pertinent labs & imaging results that were available during my care of the patient were reviewed by me and considered in my  medical decision making (see chart for details).     This patient is a very pleasant 45 y.o. year old female presenting with migraine headache. No red flag symptoms. Symptoms consistent with typical migraine per pt. Requesting prednisone IM which has resolved her migraines in the past. Administered as below. ED return precautions discussed. Patient verbalizes understanding and agreement.  .   Final Clinical Impressions(s) / UC Diagnoses   Final diagnoses:  Other migraine with status migrainosus, intractable     Discharge Instructions      -Continue prescribed migraine medications as directed  -Head to ED if unusual or new symptoms like weakness, vision changes, vision loss, worst headache of life, etc.    ED  Prescriptions   None    PDMP not reviewed this encounter.   Hazel Sams, PA-C 08/11/21 1157

## 2021-08-16 ENCOUNTER — Telehealth: Payer: BC Managed Care – PPO | Admitting: Nurse Practitioner

## 2021-08-16 DIAGNOSIS — J0110 Acute frontal sinusitis, unspecified: Secondary | ICD-10-CM | POA: Diagnosis not present

## 2021-08-16 MED ORDER — PREDNISONE 10 MG (21) PO TBPK: ORAL_TABLET | ORAL | 0 refills | Status: DC

## 2021-08-16 MED ORDER — AMOXICILLIN-POT CLAVULANATE 875-125 MG PO TABS: 1.0000 | ORAL_TABLET | Freq: Two times a day (BID) | ORAL | 0 refills | Status: DC

## 2021-08-16 NOTE — Progress Notes (Signed)
Virtual Visit Consent   Heidi Holland, you are scheduled for a virtual visit with a [facility] provider today. Just as with appointments in the office, your consent must be obtained to participate. Your consent will be active for this visit and any virtual visit you may have with one of our providers in the next 365 days. If you have a MyChart account, a copy of this consent can be sent to you electronically.  As this is a virtual visit, video technology does not allow for your provider to perform a traditional examination. This may limit your provider's ability to fully assess your condition. If your provider identifies any concerns that need to be evaluated in person or the need to arrange testing (such as labs, EKG, etc.), we will make arrangements to do so. Although advances in technology are sophisticated, we cannot ensure that it will always work on either your end or our end. If the connection with a video visit is poor, the visit may have to be switched to a telephone visit. With either a video or telephone visit, we are not always able to ensure that we have a secure connection.  By engaging in this virtual visit, you consent to the provision of healthcare and authorize for your insurance to be billed (if applicable) for the services provided during this visit. Depending on your insurance coverage, you may receive a charge related to this service.  I need to obtain your verbal consent now. Are you willing to proceed with your visit today? Heidi Holland has provided verbal consent on 08/16/2021 for a virtual visit (video or telephone). Heidi Schneiders, FNP  Date: 08/16/2021 10:14 AM  Virtual Visit via Video Note   I, Heidi Holland, connected with  Heidi Holland  ([id number], Apr 23, 1975) on 08/16/21 at 10:15 AM EDT by a video-enabled telemedicine application and verified that I am speaking with the correct person using two identifiers.  Location: Patient: Virtual Visit Location Patient: Home Provider:  Virtual Visit Location Provider: Home Office   I discussed the limitations of evaluation and management by telemedicine and the availability of in person appointments. The patient expressed understanding and agreed to proceed.    History of Present Illness: Heidi Holland is a 46 y.o. who identifies as a female who was assigned female at birth, and is being seen today with complaints of headache for the past 10 days, she went to UC 5 days ago for a steroid injection that has helped relieve her headaches in the past. She has not had full relief. Her pain and pressure is mostly in her forehead and she is now relating her headache to sinusitis.   She is more congested today with pressure in her ears and PND.   She has had these in the past     Problems:  Patient Active Problem List   Diagnosis Date Noted   CIN I (cervical intraepithelial neoplasia I) 03/03/2021   LSIL pap smear of cervix/human papillomavirus (HPV) positive 10/24/2020   Family history of early CAD 10/24/2020   B12 deficiency 07/26/2020   Somatic dysfunction of spine, cervical 07/21/2020   Status post left hip replacement 03/12/2019   T2DM (type 2 diabetes mellitus) ([place]) 07/31/2018   Dyslipidemia associated with type 2 diabetes mellitus ([place]) 07/31/2018   Allergic rhinitis 07/31/2018   Mild intermittent asthma, uncomplicated 34/19/3790   GERD (gastroesophageal reflux disease) 07/31/2018   S/P gastric bypass 07/31/2018   Migraine 07/31/2018   Anxiety 07/31/2018   Depression, major, single episode, complete  remission ([place]) 07/31/2018   Insomnia 17/49/4496   Umbilical hernia 75/91/6384   Iron deficiency anemia 06/09/2018   Cervicogenic headache 05/12/2018   Low back pain 05/12/2018   Polyarthralgia 05/12/2018    Allergies:  Allergies  Allergen Reactions   Ciprofloxacin Itching   Medications:  Current Outpatient Medications:    albuterol (VENTOLIN HFA) 108 (90 Base) MCG/ACT inhaler, INHALE 2 PUFFS BY MOUTH EVERY 6  HOURS AS NEEDED FOR WHEEZE OR SHORTNESS OF BREATH, Disp: 8.5 each, Rfl: 2   azelastine (ASTELIN) 0.1 % nasal spray, Place 2 sprays into both nostrils 2 (two) times daily. Use in each nostril as directed, Disp: 30 mL, Rfl: 5   blood glucose meter kit and supplies KIT, Dispense based on patient and insurance preference. Use up to four times daily as directed. (FOR ICD-9 250.00, 250.01)., Disp: 1 each, Rfl: 0   brompheniramine-pseudoephedrine-DM 30-2-10 MG/5ML syrup, Take 5 mLs by mouth 4 (four) times daily as needed., Disp: 120 mL, Rfl: 0   buPROPion (WELLBUTRIN SR) 150 MG 12 hr tablet, Take 150 mg by mouth daily., Disp: , Rfl:    clonazePAM (KLONOPIN) 0.5 MG tablet, Take 1 tablet (0.5 mg total) by mouth 2 (two) times daily as needed for anxiety., Disp: 60 tablet, Rfl: 5   Continuous Blood Gluc Receiver (FREESTYLE LIBRE READER) DEVI, 1 Device by Does not apply route daily as needed (to check blood sugar)., Disp: 1 Device, Rfl: 0   Continuous Blood Gluc Sensor (FREESTYLE LIBRE SENSOR SYSTEM) MISC, Apply 1 sensor to skin every 14 days., Disp: 2 each, Rfl: 11   doxycycline (VIBRA-TABS) 100 MG tablet, Take 1 tablet (100 mg total) by mouth 2 (two) times daily., Disp: 20 tablet, Rfl: 0   lumateperone tosylate (CAPLYTA) 42 MG capsule, , Disp: , Rfl:    metFORMIN (GLUCOPHAGE) 1000 MG tablet, TAKE 1 TABLET BY MOUTH TWICE A DAY WITH A MEAL, Disp: 180 tablet, Rfl: 3   ondansetron (ZOFRAN-ODT) 4 MG disintegrating tablet, TAKE 1 TABLET BY MOUTH EVERY 8 HOURS AS NEEDED FOR NAUSEA OR VOMITING, Disp: 20 tablet, Rfl: 0   pantoprazole (PROTONIX) 40 MG tablet, TAKE 1 TABLET BY MOUTH EVERY DAY IN THE EVENING, Disp: 90 tablet, Rfl: 0   pregabalin (LYRICA) 150 MG capsule, Take 1 capsule (150 mg total) by mouth 2 (two) times daily., Disp: 60 capsule, Rfl: 5   QUEtiapine (SEROQUEL) 100 MG tablet, Take by mouth., Disp: , Rfl:    rosuvastatin (CRESTOR) 20 MG tablet, Take 1 tablet (20 mg total) by mouth daily., Disp: 90 tablet,  Rfl: 3   SUMAtriptan (TOSYMRA) 10 MG/ACT SOLN, PLACE 1 SPRAY INTO THE NOSE EVERY HOUR AS NEEDED (MAXIMUM 3 SPRAYS IN 24 HOURS), Disp: 6 each, Rfl: 5   tirzepatide (MOUNJARO) 5 MG/0.5ML Pen, Inject 5 mg into the skin once a week., Disp: 6 mL, Rfl: 1   tiZANidine (ZANAFLEX) 4 MG tablet, TAKE ONE TABLET BY MOUTH THREE TIMES DAILY, Disp: 90 tablet, Rfl: 0   traMADol (ULTRAM) 50 MG tablet, Take 1 tablet (50 mg total) by mouth every 8 (eight) hours as needed., Disp: 90 tablet, Rfl: 5   zolpidem (AMBIEN) 5 MG tablet, Take 1 tablet (5 mg total) by mouth at bedtime as needed for sleep., Disp: 30 tablet, Rfl: 5   zonisamide (ZONEGRAN) 100 MG capsule, Take 1 capsule (100 mg total) by mouth daily., Disp: 30 capsule, Rfl: 5  Observations/Objective: Patient is well-developed, well-nourished in no acute distress.  Resting comfortably  at home.  Head  is normocephalic, atraumatic.  No labored breathing.  Speech is clear and coherent with logical content.  Patient is alert and oriented at baseline.    Assessment and Plan: 1. Acute non-recurrent frontal sinusitis  - predniSONE (STERAPRED UNI-PAK 21 TAB) 10 MG (21) TBPK tablet; Take 6 tablets on day one, 5 on day two, 4 on day three, 3 on day four, 2 on day five, and 1 on day six. Take with food.  Dispense: 21 tablet; Refill: 0 - amoxicillin-clavulanate (AUGMENTIN) 875-125 MG tablet; Take 1 tablet by mouth 2 (two) times daily for 7 days. Take with food  Dispense: 14 tablet; Refill: 0     Follow Up Instructions: I discussed the assessment and treatment plan with the patient. The patient was provided an opportunity to ask questions and all were answered. The patient agreed with the plan and demonstrated an understanding of the instructions.  A copy of instructions were sent to the patient via MyChart unless otherwise noted below.    The patient was advised to call back or seek an in-person evaluation if the symptoms worsen or if the condition fails to improve  as anticipated.  Time:  I spent  10 minutes with the patient via telehealth technology discussing the above problems/concerns.    Heidi Schneiders, FNP

## 2021-08-18 ENCOUNTER — Other Ambulatory Visit: Payer: Self-pay | Admitting: Family Medicine

## 2021-08-19 ENCOUNTER — Other Ambulatory Visit: Payer: Self-pay | Admitting: Family Medicine

## 2021-08-23 ENCOUNTER — Other Ambulatory Visit: Payer: Self-pay | Admitting: Family Medicine

## 2021-08-23 ENCOUNTER — Telehealth: Payer: Self-pay | Admitting: *Deleted

## 2021-08-23 NOTE — Telephone Encounter (Signed)
Key: [id number] - Rx #: [id number] Status Sent to Plan today Drug traMADol HCl 50MG  tablets Waiting for determination

## 2021-08-23 NOTE — Telephone Encounter (Signed)
Pt requesting refill for Clonazepam 0.5 mg. Last OV 08/16/2021.

## 2021-08-25 ENCOUNTER — Telehealth (HOSPITAL_COMMUNITY): Payer: Self-pay | Admitting: Pharmacy Technician

## 2021-08-25 ENCOUNTER — Telehealth: Payer: Self-pay | Admitting: Neurology

## 2021-08-25 ENCOUNTER — Other Ambulatory Visit (HOSPITAL_COMMUNITY): Payer: Self-pay

## 2021-08-25 NOTE — Telephone Encounter (Signed)
Patient Advocate Encounter   Received notification that prior authorization for Botox 200UNIT solution is required.   PA submitted on 08/25/2021 Key [id number]  Status is pending       Roland Earl, CPhT Pharmacy Patient Advocate Specialist [facility] Patient Advocate Team Direct Number: [phone]  Fax: [phone]

## 2021-08-25 NOTE — Telephone Encounter (Signed)
Patient needs a call back to discuss her ins for botox and migraine meds. She has been having a lot of HA and doesn't want to miss her meds. She now has [employer] and needs to know if it will need PA for her botox. She has an appt 03/2022, wants to know if she needs to be seen sooner for ins to work. The medication she is taking now is requiring PA. azelastine

## 2021-08-25 NOTE — Telephone Encounter (Signed)
Submitted benefits in [employer] Portal: [id number]

## 2021-08-25 NOTE — Telephone Encounter (Signed)
Messaged PA team to start PA for Botox. Patient last seen 03/2021. Patient may be added to any of Dr.Jaffe Botox days per last visit notes.   LMOVM for patient to call us back in regards to medication refill.

## 2021-08-28 ENCOUNTER — Other Ambulatory Visit (HOSPITAL_COMMUNITY): Payer: Self-pay

## 2021-08-28 NOTE — Telephone Encounter (Signed)
Deniedon July 13 PA Case: [id number], Status: Denied. Notification: Completed

## 2021-08-28 NOTE — Telephone Encounter (Signed)
Full benefits summary uploaded to patient's chart:

## 2021-08-30 ENCOUNTER — Ambulatory Visit: Payer: BC Managed Care – PPO | Admitting: Physician Assistant

## 2021-08-31 NOTE — Telephone Encounter (Signed)
Patient Advocate Encounter  Prior Authorization for Botox 200UNIT solution has been approved.    PA# [id number] Effective dates: 08/25/2021 through 02/25/2022  Buy and Cicero Duck, CPhT Pharmacy Patient Advocate Specialist [facility] Patient Advocate Team Direct Number: [phone]  Fax: [phone]

## 2021-08-31 NOTE — Telephone Encounter (Signed)
Tried calling patient to schedule a visit for Botox in august 18th.   Annabelle Harman can you call patient back to schedule her for the Botox day in august.

## 2021-09-05 ENCOUNTER — Telehealth: Payer: Self-pay | Admitting: Family Medicine

## 2021-09-05 ENCOUNTER — Other Ambulatory Visit: Payer: Self-pay | Admitting: Family Medicine

## 2021-09-05 NOTE — Telephone Encounter (Signed)
Patient requests RX to go up on dosage to 7.5 for [facility] be sent to  [facility] # [address] [place] - [address] Phone:  [phone]  Fax:  [phone]

## 2021-09-07 MED ORDER — TIRZEPATIDE 7.5 MG/0.5ML ~~LOC~~ SOAJ: 7.5000 mg | SUBCUTANEOUS | 0 refills | Status: DC

## 2021-09-07 NOTE — Telephone Encounter (Signed)
Ok with me. Please place any necessary orders. 

## 2021-09-07 NOTE — Telephone Encounter (Signed)
Pt notified Rx for Mounjaro 7.5 mg sent to [facility].

## 2021-09-07 NOTE — Telephone Encounter (Signed)
Please advise 

## 2021-09-11 ENCOUNTER — Telehealth: Payer: Self-pay | Admitting: *Deleted

## 2021-09-11 NOTE — Telephone Encounter (Signed)
Key: [id number] - PA Case ID: [id number] - Rx #: [id number] Status Sent to Plan today Drug Mounjaro 7.5MG /0.5ML pen-injectors Waiting for determination

## 2021-09-13 ENCOUNTER — Telehealth: Payer: Self-pay | Admitting: Family Medicine

## 2021-09-13 ENCOUNTER — Encounter: Payer: Self-pay | Admitting: Family Medicine

## 2021-09-13 ENCOUNTER — Ambulatory Visit (INDEPENDENT_AMBULATORY_CARE_PROVIDER_SITE_OTHER): Payer: BC Managed Care – PPO | Admitting: Family Medicine

## 2021-09-13 VITALS — BP 125/81 | HR 76 | Temp 97.7°F | Ht 63.0 in | Wt 190.8 lb

## 2021-09-13 DIAGNOSIS — E119 Type 2 diabetes mellitus without complications: Secondary | ICD-10-CM | POA: Diagnosis not present

## 2021-09-13 DIAGNOSIS — R202 Paresthesia of skin: Secondary | ICD-10-CM | POA: Diagnosis not present

## 2021-09-13 DIAGNOSIS — M5416 Radiculopathy, lumbar region: Secondary | ICD-10-CM | POA: Diagnosis not present

## 2021-09-13 DIAGNOSIS — J309 Allergic rhinitis, unspecified: Secondary | ICD-10-CM

## 2021-09-13 LAB — POCT GLYCOSYLATED HEMOGLOBIN (HGB A1C): Hemoglobin A1C: 5.8 % — AB (ref 4.0–5.6)

## 2021-09-13 MED ORDER — AMOXICILLIN-POT CLAVULANATE 875-125 MG PO TABS: 1.0000 | ORAL_TABLET | Freq: Two times a day (BID) | ORAL | 0 refills | Status: DC

## 2021-09-13 MED ORDER — PREDNISONE 50 MG PO TABS: ORAL_TABLET | ORAL | 0 refills | Status: DC

## 2021-09-13 NOTE — Assessment & Plan Note (Signed)
A1c is much better today at 5.8.  Her insurance will no longer pay for [place].  She will try to get a discount program.  She may want to stretch back to Ozempic.  We will continue her current dose of Mounjaro for now they will approve it.  We will also continue metformin 1000 mg twice daily.  We can recheck A1c in 3 to 6 months.

## 2021-09-13 NOTE — Telephone Encounter (Signed)
Patient states:  - Pharmacy informed her that a PA is required for mounjaro 7.5 mg in order to be refilled.  - Pharmacy informed her that the PA was sent to [place] this morning.   Patient requests:  - Someone contact her upon confirmation that the PA has been received by PCP or PCP's team.  - Completion of the PA as soon as possible.

## 2021-09-13 NOTE — Patient Instructions (Signed)
It was very nice to see you today!  I will send in prednisone and antibiotics for your sinus infection.  Your A1c looks great today.  Please come back in 3 to 6 months to recheck your A1c.  Take care, Dr [name]  PLEASE NOTE:  If you had any lab tests please let us know if you have not heard back within a few days. You may see your results on mychart before we have a chance to review them but we will give you a call once they are reviewed by [place]. If we ordered any referrals today, please let us know if you have not heard from their office within the next week.   Please try these tips to maintain a healthy lifestyle:  Eat at least 3 REAL meals and 1-2 snacks per day.  Aim for no more than 5 hours between eating.  If you eat breakfast, please do so within one hour of getting up.   Each meal should contain half fruits/vegetables, one quarter protein, and one quarter carbs (no bigger than a computer mouse)  Cut down on sweet beverages. This includes juice, soda, and sweet tea.   Drink at least 1 glass of water with each meal and aim for at least 8 glasses per day  Exercise at least 150 minutes every week.

## 2021-09-13 NOTE — Assessment & Plan Note (Signed)
Overall stable.  She can continue Astelin.  We will be treating her sinus infection as above.

## 2021-09-13 NOTE — Assessment & Plan Note (Signed)
She has been following with orthopedics for this.  She thinks she may have meralgia paresthetica.  Her descriptions of her symptoms are consistent with this.  She has had MRI done recently which was not revealing.  She will be following up again with orthopedics later today.  She is already on Lyrica 150 mg daily.  Her pain can sometimes be severe and debilitating.  We will defer further management to orthopedics though she may benefit from increasing her dose of Lyrica versus starting medication such as cymbalta.

## 2021-09-13 NOTE — Progress Notes (Signed)
   Heidi Holland is a 46 y.o. female who presents today for an office visit.  Assessment/Plan:  New/Acute Problems: Sinusitis  No red flags.  Given length of symptoms we will start course of Augmentin and prednisone.  This is worked well for her in the past.  She can continue over-the-counter meds.  Chronic Problems Addressed Today: Paresthesia She has been following with orthopedics for this.  She thinks she may have meralgia paresthetica.  Her descriptions of her symptoms are consistent with this.  She has had MRI done recently which was not revealing.  She will be following up again with orthopedics later today.  She is already on Lyrica 150 mg daily.  Her pain can sometimes be severe and debilitating.  We will defer further management to orthopedics though she may benefit from increasing her dose of Lyrica versus starting medication such as cymbalta.   Allergic rhinitis Overall stable.  She can continue Astelin.  We will be treating her sinus infection as above.  T2DM (type 2 diabetes mellitus) (HCC) A1c is much better today at 5.8.  Her insurance will no longer pay for [facility].  She will try to get a discount program.  She may want to stretch back to Ozempic.  We will continue her current dose of Mounjaro for now they will approve it.  We will also continue metformin 1000 mg twice daily.  We can recheck A1c in 3 to 6 months.     Subjective:  HPI:  See A/p for status of chronic conditions.   She is concerned about a sinus infection.  Started about a week ago.  Consistent with previous sinus infections.  A lot of congestion and drainage.  No fevers or chills.  Over-the-counter meds have not helped.       Objective:  Physical Exam: BP 125/81   Pulse 76   Temp 97.7 F (36.5 C) (Temporal)   Ht 5\' 3"  (1.6 m)   Wt 190 lb 12.8 oz (86.5 kg)   LMP 08/30/2021   SpO2 98%   BMI 33.80 kg/m   Wt Readings from Last 3 Encounters:  09/13/21 190 lb 12.8 oz (86.5 kg)  05/24/21 206 lb 3.2  oz (93.5 kg)  04/12/21 205 lb 9.6 oz (93.3 kg)  Gen: No acute distress, resting comfortably Neuro: Grossly normal, moves all extremities Psych: Normal affect and thought content      Shuntell Foody M. 06/12/21, MD 09/13/2021 11:32 AM

## 2021-09-14 DIAGNOSIS — F3162 Bipolar disorder, current episode mixed, moderate: Secondary | ICD-10-CM | POA: Diagnosis not present

## 2021-09-14 DIAGNOSIS — F4312 Post-traumatic stress disorder, chronic: Secondary | ICD-10-CM | POA: Diagnosis not present

## 2021-09-14 DIAGNOSIS — F411 Generalized anxiety disorder: Secondary | ICD-10-CM | POA: Diagnosis not present

## 2021-09-14 NOTE — Telephone Encounter (Signed)
KeyRanda Spike - PA Case ID: [id number] - Rx #: [id number] Sent to Plan today Drug Mounjaro 7.5MG /0.5ML pen-injectors Waiting for determination

## 2021-09-19 NOTE — Telephone Encounter (Signed)
PA #[id number] key [id number] Rx Mounjaro send today Waiting for determination

## 2021-09-19 NOTE — Telephone Encounter (Signed)
PA denied today  Patient notified  Stated if is possible to send Ozempic to [facility] pharmacy

## 2021-09-19 NOTE — Telephone Encounter (Signed)
Ok to send in ozempic 0.25mg  weekly x 4 weeks and then increase to 0.5mg  weekly.  Heidi Holland. [name], MD 09/19/2021 1:27 PM

## 2021-09-19 NOTE — Telephone Encounter (Signed)
Spoke with patient stated tried Ozempic and Victoza in the past Will call insurance for PA [employer]

## 2021-09-19 NOTE — Telephone Encounter (Signed)
Patient requests to be called at ph# [phone] re: Patient states she received a determination letter that states PA for Greggory Keen has been denied unless Patient has tried 2 unsuccessful preferred GLP1 receptor antagonists such as Ozempic or Victoza.  Patient states her A1C has gone down 3 points in 4 months while on Mounjaro.

## 2021-09-20 ENCOUNTER — Other Ambulatory Visit: Payer: Self-pay | Admitting: *Deleted

## 2021-09-20 MED ORDER — OZEMPIC (0.25 OR 0.5 MG/DOSE) 2 MG/3ML ~~LOC~~ SOPN: 0.2500 mg | PEN_INJECTOR | SUBCUTANEOUS | 1 refills | Status: DC

## 2021-09-20 NOTE — Telephone Encounter (Signed)
Rx send to [facility] pharmacy  

## 2021-09-21 ENCOUNTER — Telehealth: Payer: Self-pay | Admitting: *Deleted

## 2021-09-21 ENCOUNTER — Ambulatory Visit: Payer: BC Managed Care – PPO | Admitting: Physician Assistant

## 2021-09-21 NOTE — Telephone Encounter (Signed)
(  Key: [id number]) Rx #: [id number] Ozempic (0.25 or 0.5 MG/DOSE) 2MG /3ML pen-injectors Waiting for determination

## 2021-09-22 NOTE — Telephone Encounter (Signed)
Message from [facility] PA Case: [id number], Status: Denied. Notification: Completed Patient notified   Patient requesting Referral to endocrinology,  Ok to placed referral?

## 2021-09-22 NOTE — Telephone Encounter (Signed)
Patient called in for an update on ozempic prescription. Informed patient that her ozempic was sent in on 08/09 to the [facility] on [address].   Patient requests: - Medication be sent to [facility] on [address] since they have the medication in stock there.  - Clearance to start at 0.5 mg weekly instead of 0.25 mg.

## 2021-09-25 ENCOUNTER — Other Ambulatory Visit: Payer: Self-pay | Admitting: *Deleted

## 2021-09-25 DIAGNOSIS — E119 Type 2 diabetes mellitus without complications: Secondary | ICD-10-CM

## 2021-09-25 NOTE — Telephone Encounter (Signed)
Ok with me. Please place any necessary orders. 

## 2021-09-25 NOTE — Telephone Encounter (Signed)
Referral for Endo placed

## 2021-09-26 ENCOUNTER — Telehealth: Payer: Self-pay

## 2021-09-26 NOTE — Telephone Encounter (Signed)
Letter rec. 09/26/21,  Need botox delivery for [facility]. Pat script on hold until payment.  If she is buy and bill please let heather know so we can call the patient.

## 2021-09-27 ENCOUNTER — Encounter (HOSPITAL_COMMUNITY): Payer: Self-pay

## 2021-09-27 ENCOUNTER — Emergency Department (HOSPITAL_COMMUNITY): Payer: BC Managed Care – PPO

## 2021-09-27 ENCOUNTER — Emergency Department (HOSPITAL_COMMUNITY)
Admission: EM | Admit: 2021-09-27 | Discharge: 2021-09-27 | Disposition: A | Discharge status: Home / Self Care | Payer: BC Managed Care – PPO | Attending: Emergency Medicine | Admitting: Emergency Medicine

## 2021-09-27 DIAGNOSIS — R109 Unspecified abdominal pain: Secondary | ICD-10-CM | POA: Insufficient documentation

## 2021-09-27 DIAGNOSIS — Z7984 Long term (current) use of oral hypoglycemic drugs: Secondary | ICD-10-CM | POA: Diagnosis not present

## 2021-09-27 DIAGNOSIS — E119 Type 2 diabetes mellitus without complications: Secondary | ICD-10-CM | POA: Insufficient documentation

## 2021-09-27 DIAGNOSIS — R519 Headache, unspecified: Secondary | ICD-10-CM | POA: Diagnosis not present

## 2021-09-27 DIAGNOSIS — R197 Diarrhea, unspecified: Secondary | ICD-10-CM | POA: Diagnosis not present

## 2021-09-27 DIAGNOSIS — R112 Nausea with vomiting, unspecified: Secondary | ICD-10-CM | POA: Diagnosis not present

## 2021-09-27 DIAGNOSIS — K5900 Constipation, unspecified: Secondary | ICD-10-CM

## 2021-09-27 DIAGNOSIS — K9189 Other postprocedural complications and disorders of digestive system: Secondary | ICD-10-CM | POA: Diagnosis not present

## 2021-09-27 LAB — COMPREHENSIVE METABOLIC PANEL
ALT: 20 U/L (ref 0–44)
AST: 17 U/L (ref 15–41)
Albumin: 4 g/dL (ref 3.5–5.0)
Alkaline Phosphatase: 54 U/L (ref 38–126)
Anion gap: 9 (ref 5–15)
BUN: 25 mg/dL — ABNORMAL HIGH (ref 6–20)
CO2: 22 mmol/L (ref 22–32)
Calcium: 9.5 mg/dL (ref 8.9–10.3)
Chloride: 109 mmol/L (ref 98–111)
Creatinine, Ser: 0.64 mg/dL (ref 0.44–1.00)
GFR, Estimated: >60 mL/min (ref >60)
Glucose, Bld: 133 mg/dL — ABNORMAL HIGH (ref 70–99)
Potassium: 4.6 mmol/L (ref 3.5–5.1)
Sodium: 140 mmol/L (ref 135–145)
Total Bilirubin: 0.3 mg/dL (ref 0.3–1.2)
Total Protein: 7.2 g/dL (ref 6.5–8.1)

## 2021-09-27 LAB — URINALYSIS, ROUTINE W REFLEX MICROSCOPIC
Bilirubin Urine: NEGATIVE
Glucose, UA: NEGATIVE mg/dL
Hgb urine dipstick: NEGATIVE
Ketones, ur: NEGATIVE mg/dL
Nitrite: NEGATIVE
Protein, ur: NEGATIVE mg/dL
Specific Gravity, Urine: 1.012 (ref 1.005–1.030)
pH: 5 (ref 5.0–8.0)

## 2021-09-27 LAB — CBC WITH DIFFERENTIAL/PLATELET
Abs Immature Granulocytes: 0.03 10*3/uL (ref 0.00–0.07)
Basophils Absolute: 0 10*3/uL (ref 0.0–0.1)
Basophils Relative: 1 %
Eosinophils Absolute: 0 10*3/uL (ref 0.0–0.5)
Eosinophils Relative: 0 %
HCT: 35.6 % — ABNORMAL LOW (ref 36.0–46.0)
Hemoglobin: 11.4 g/dL — ABNORMAL LOW (ref 12.0–15.0)
Immature Granulocytes: 0 %
Lymphocytes Relative: 22 %
Lymphs Abs: 1.8 10*3/uL (ref 0.7–4.0)
MCH: 26 pg (ref 26.0–34.0)
MCHC: 32 g/dL (ref 30.0–36.0)
MCV: 81.3 fL (ref 80.0–100.0)
Monocytes Absolute: 0.6 10*3/uL (ref 0.1–1.0)
Monocytes Relative: 7 %
Neutro Abs: 5.8 10*3/uL (ref 1.7–7.7)
Neutrophils Relative %: 70 %
Platelets: 270 10*3/uL (ref 150–400)
RBC: 4.38 MIL/uL (ref 3.87–5.11)
RDW: 14.3 % (ref 11.5–15.5)
WBC: 8.2 10*3/uL (ref 4.0–10.5)
nRBC: 0 % (ref 0.0–0.2)

## 2021-09-27 LAB — I-STAT BETA HCG BLOOD, ED (MC, WL, AP ONLY): I-stat hCG, quantitative: <5 m[IU]/mL (ref <5)

## 2021-09-27 LAB — LIPASE, BLOOD: Lipase: 51 U/L (ref 11–51)

## 2021-09-27 MED ORDER — DIAZEPAM 5 MG PO TABS
5.0000 mg | ORAL_TABLET | Freq: Once | ORAL | Status: AC
  Administered 2021-09-27: 5 mg via ORAL
  Filled 2021-09-27: qty 1

## 2021-09-27 MED ORDER — KETOROLAC TROMETHAMINE 30 MG/ML IJ SOLN
30.0000 mg | Freq: Once | INTRAMUSCULAR | Status: AC
  Administered 2021-09-27: 30 mg via INTRAVENOUS
  Filled 2021-09-27: qty 1

## 2021-09-27 MED ORDER — SODIUM CHLORIDE 0.9 % IV BOLUS
1000.0000 mL | Freq: Once | INTRAVENOUS | Status: AC
Start: 2021-09-27 — End: 2021-09-27
  Administered 2021-09-27: 1000 mL via INTRAVENOUS

## 2021-09-27 MED ORDER — IOHEXOL 300 MG/ML  SOLN
100.0000 mL | Freq: Once | INTRAMUSCULAR | Status: AC | PRN
  Administered 2021-09-27: 100 mL via INTRAVENOUS

## 2021-09-27 MED ORDER — DIPHENHYDRAMINE HCL 50 MG/ML IJ SOLN
12.5000 mg | Freq: Once | INTRAMUSCULAR | Status: AC
  Administered 2021-09-27: 12.5 mg via INTRAVENOUS
  Filled 2021-09-27: qty 1

## 2021-09-27 MED ORDER — METOCLOPRAMIDE HCL 5 MG/ML IJ SOLN
10.0000 mg | Freq: Once | INTRAMUSCULAR | Status: AC
  Administered 2021-09-27: 10 mg via INTRAVENOUS
  Filled 2021-09-27: qty 2

## 2021-09-27 MED ORDER — MAGNESIUM SULFATE 2 GM/50ML IV SOLN
2.0000 g | Freq: Once | INTRAVENOUS | Status: AC
  Administered 2021-09-27: 2 g via INTRAVENOUS
  Filled 2021-09-27: qty 50

## 2021-09-27 MED ORDER — DEXAMETHASONE SODIUM PHOSPHATE 10 MG/ML IJ SOLN
6.0000 mg | Freq: Once | INTRAMUSCULAR | Status: AC
  Administered 2021-09-27: 6 mg via INTRAVENOUS
  Filled 2021-09-27: qty 1

## 2021-09-27 NOTE — ED Provider Triage Note (Signed)
Emergency Medicine Provider Triage Evaluation Note  Heidi Holland , a 46 y.o. female  was evaluated in triage.  Pt complains of nausea, vomiting does have been ongoing for the past week.  Does have a history of migraines, reports she gets Botox for this, last time she gave injections was a while ago.  She is due for these on Friday.  She also reports abdominal pain, feeling more distended with a last bowel movement this morning of diarrhea.  Is a prior gastric bypass patient and is concerned for obstruction versus diverticulitis.  Review of Systems  Positive: Nausea, vomiting, diarrhea Negative: Fever, chest pain  Physical Exam  BP 114/78   Pulse 82   Temp 98.6 F (37 C) (Oral)   Resp 16   Ht 5\' 3"  (1.6 m)   Wt 86.2 kg   LMP 08/30/2021   SpO2 97%   BMI 33.66 kg/m  Gen:   Awake, no distress   Resp:  Normal effort  MSK:   Moves extremities without difficulty  Other:   Medical Decision Making  Medically screening exam initiated at 11:42 AM.  Appropriate orders placed.  Heidi Holland was informed that the remainder of the evaluation will be completed by another provider, this initial triage assessment does not replace that evaluation, and the importance of remaining in the ED until their evaluation is complete.     Heidi Ahmadi, PA-C 09/27/21 1146

## 2021-09-27 NOTE — ED Provider Notes (Signed)
   ED Course / MDM   Clinical Course as of 09/27/21 1806  Wed Sep 27, 2021  1619 CT reviewed - moderate stool burden, no SBO, no acute findings [MT]  1654 Received sign out from Dr. Renaye Rakers pending re-assessment from migraine headache. Feels like prior headaches. Also with some abdominal pain with negative CT scan. Will re-assess [WS]  1712 No improvement of HA after initial migraine meds -we'll try valium and magnesium, I suspect there is strongly an occipital muscular component to the headache [MT]  1805 Patient feels much better. Requests discharge to home.  Request prescription for Reglan which she says has worked for her in the past.  We will prescription.  Patient has follow-up with neurology for Botox on Friday. Will discharge patient to home. All questions answered. Patient comfortable with plan of discharge. Return precautions discussed with patient and specified on the after visit summary.  [WS]    Clinical Course User Index [MT] Trifan, Kermit Balo, MD [WS] Lonell Grandchild, MD   Medical Decision Making Amount and/or Complexity of Data Reviewed Radiology: ordered.  Risk Prescription drug management.        Lonell Grandchild, MD 09/27/21 1806

## 2021-09-27 NOTE — ED Provider Notes (Signed)
[facility] DEPT Provider Note   CSN: [id number] Arrival date & time: 09/27/21  1053     History  Chief Complaint  Patient presents with   Headache   Nausea   Emesis   Diarrhea   Abdominal Pain    Helina Hullum is a 46 y.o. female with history of diabetes, chronic migraine headaches, diverticulitis, gastric bypass, abdominal hernia status post mesh repair, presenting to ED with complaint of headache and abdominal pain and fullness.  Patient reports she has had a headache for about 5 days she describes her typical migraine, which is in her posterior head and upper neck.  She says she has been getting Botox injections in the past for this which helped tremendously, unfortunately due to insurance changes, she was unable to get Botox for several months.  She is now scheduled for an injection on Friday but does not feel she can make it on Friday with her head pain.  She denies any new features to her headache otherwise.  She reports separately she is also had abdominal distention, nausea, poor appetite.  Very small bowel movement yesterday.  She is concerned about possible diverticulitis, which has had in the past will, or bowel obstruction.  She has a history of gastric bypass and hernia with mesh repair.  She has no localized pain in her abdomen.  HPI     Home Medications Prior to Admission medications   Medication Sig Start Date End Date Taking? Authorizing Provider  albuterol (VENTOLIN HFA) 108 (90 Base) MCG/ACT inhaler INHALE 2 PUFFS BY MOUTH EVERY 6 HOURS AS NEEDED FOR WHEEZE OR SHORTNESS OF BREATH 04/10/21   Vivi Barrack, MD  amoxicillin-clavulanate (AUGMENTIN) 875-125 MG tablet Take 1 tablet by mouth 2 (two) times daily. 09/13/21   Vivi Barrack, MD  azelastine (ASTELIN) 0.1 % nasal spray Place 2 sprays into both nostrils 2 (two) times daily. Use in each nostril as directed 07/26/20   Vivi Barrack, MD  blood glucose meter kit and supplies KIT  Dispense based on patient and insurance preference. Use up to four times daily as directed. (FOR ICD-9 250.00, 250.01). 07/31/18   Vivi Barrack, MD  buPROPion [facility] SR) 150 MG 12 hr tablet Take 150 mg by mouth daily.    [provider]  clonazePAM (KLONOPIN) 0.5 MG tablet Take 1 tablet by mouth 2 times daily as needed for anxiety 08/24/21   Vivi Barrack, MD  Continuous Blood Gluc Receiver (FREESTYLE LIBRE READER) DEVI 1 Device by Does not apply route daily as needed (to check blood sugar). 08/14/18   Vivi Barrack, MD  Continuous Blood Gluc Sensor (FREESTYLE LIBRE SENSOR SYSTEM) MISC Apply 1 sensor to skin every 14 days. 08/14/18   Vivi Barrack, MD  lumateperone tosylate (CAPLYTA) 42 MG capsule  01/05/21   [provider]  metFORMIN (GLUCOPHAGE) 1000 MG tablet TAKE 1 TABLET BY MOUTH TWICE A DAY WITH A MEAL 03/20/21   Vivi Barrack, MD  ondansetron (ZOFRAN-ODT) 4 MG disintegrating tablet DISSOLVE ONE TABLET ON TOP OF TONGUE EVERY EIGHT HOURS AS NEEDED FOR NAUSEA OR VOMITING 08/21/21   Vivi Barrack, MD  pantoprazole (PROTONIX) 40 MG tablet TAKE 1 TABLET BY MOUTH EVERY DAY IN THE EVENING 07/10/21   Vivi Barrack, MD  predniSONE (DELTASONE) 50 MG tablet Take 1 tablet daily for 5 days. 09/13/21   Vivi Barrack, MD  pregabalin (LYRICA) 150 MG capsule TAKE 1 CAPSULE BY MOUTH TWICE A  DAY 08/21/21   Vivi Barrack, MD  QUEtiapine (SEROQUEL) 100 MG tablet Take by mouth. 05/03/21   [provider]  rosuvastatin (CRESTOR) 20 MG tablet Take 1 tablet (20 mg total) by mouth daily. 05/24/21   Vivi Barrack, MD  Semaglutide,0.25 or 0.5MG/DOS, (OZEMPIC, 0.25 OR 0.5 MG/DOSE,) 2 MG/3ML SOPN Inject 0.25 mg into the skin once a week. x 4 weeks and then increase to 0.50m weekly. 09/20/21   PVivi Barrack MD  SUMAtriptan (TOSYMRA) 10 MG/ACT SOLN PLACE 1 SPRAY INTO THE NOSE EVERY HOUR AS NEEDED (MAXIMUM 3 SPRAYS IN 24 HOURS) 03/15/21   JTomi Likens Adam R, DO  tirzepatide ([facility] 7.5  MG/0.5ML Pen Inject 7.5 mg into the skin once a week. 09/07/21   PVivi Barrack MD  tiZANidine (ZANAFLEX) 4 MG tablet TAKE ONE TABLET BY MOUTH THREE TIMES DAILY 09/05/21   PVivi Barrack MD  traMADol (ULTRAM) 50 MG tablet TAKE ONE TABLET BY MOUTH EVERY EIGHT HOURS AS NEEDED 08/21/21   PVivi Barrack MD  zolpidem (AMBIEN) 5 MG tablet Take 1 tablet (5 mg total) by mouth at bedtime as needed for sleep. 04/18/21   PVivi Barrack MD  zonisamide (ZONEGRAN) 100 MG capsule Take 1 capsule (100 mg total) by mouth daily. 04/17/21   JPieter Partridge DO      Allergies    Ciprofloxacin    Review of Systems   Review of Systems  Physical Exam Updated Vital Signs BP 136/80   Pulse 80   Temp 98.1 F (36.7 C) (Oral)   Resp 17   Ht 5' 3"  (1.6 m)   Wt 86.2 kg   LMP 08/30/2021   SpO2 99%   BMI 33.66 kg/m  Physical Exam Constitutional:      General: She is not in acute distress. HENT:     Head: Normocephalic and atraumatic.  Eyes:     Conjunctiva/sclera: Conjunctivae normal.     Pupils: Pupils are equal, round, and reactive to light.  Cardiovascular:     Rate and Rhythm: Normal rate and regular rhythm.  Pulmonary:     Effort: Pulmonary effort is normal. No respiratory distress.  Abdominal:     Tenderness: There is no abdominal tenderness.  Skin:    General: Skin is warm and dry.  Neurological:     General: No focal deficit present.     Mental Status: She is alert. Mental status is at baseline.  Psychiatric:        Mood and Affect: Mood normal.        Behavior: Behavior normal.     ED Results / Procedures / Treatments   Labs (all labs ordered are listed, but only abnormal results are displayed) Labs Reviewed  CBC WITH DIFFERENTIAL/PLATELET - Abnormal; Notable for the following components:      Result Value   Hemoglobin 11.4 (*)    HCT 35.6 (*)    All other components within normal limits  COMPREHENSIVE METABOLIC PANEL - Abnormal; Notable for the following components:   Glucose,  Bld 133 (*)    BUN 25 (*)    All other components within normal limits  URINALYSIS, ROUTINE W REFLEX MICROSCOPIC - Abnormal; Notable for the following components:   Leukocytes,Ua TRACE (*)    Bacteria, UA FEW (*)    All other components within normal limits  LIPASE, BLOOD  I-STAT BETA HCG BLOOD, ED ([facility], [facility], [facility] ONLY)    EKG None  Radiology CT ABDOMEN PELVIS W CONTRAST  Result Date: 09/27/2021 CLINICAL DATA:  Bowel obstruction suspected history of gastric bypass hernia mesh repair, diverticulitis, nausea, vomiting abdominal distention EXAM: CT ABDOMEN AND PELVIS WITH CONTRAST TECHNIQUE: Multidetector CT imaging of the abdomen and pelvis was performed using the standard protocol following bolus administration of intravenous contrast. RADIATION DOSE REDUCTION: This exam was performed according to the departmental dose-optimization program which includes automated exposure control, adjustment of the mA and/or kV according to patient size and/or use of iterative reconstruction technique. CONTRAST:  181m OMNIPAQUE IOHEXOL 300 MG/ML  SOLN COMPARISON:  03/17/2021 FINDINGS: Lower chest: No acute abnormality. Hepatobiliary: No solid liver abnormality is seen. No gallstones, gallbladder wall thickening, or biliary dilatation. Pancreas: Unremarkable. No pancreatic ductal dilatation or surrounding inflammatory changes. Spleen: Splenomegaly, maximum span 14.7 cm. Adrenals/Urinary Tract: Adrenal glands are unremarkable. Kidneys are normal, without renal calculi, solid lesion, or hydronephrosis. Bladder is unremarkable. Stomach/Bowel: Status post Roux type gastric bypass. Appendix appears normal. No evidence of bowel wall thickening, distention, or inflammatory changes. Sigmoid colon resection and reanastomosis. Occasional diverticula of the remnant sigmoid colon. Moderate burden of stool throughout the colon. Vascular/Lymphatic: Scattered aortic atherosclerosis. No enlarged abdominal or pelvic lymph nodes.  Reproductive: No mass or other significant abnormality. Other: Low midline ventral hernia mesh repair (series 2, image 659. No ascites. Musculoskeletal: No acute or significant osseous findings. Status post left hip total arthroplasty. IMPRESSION: 1. No evidence of bowel obstruction. 2. Status post Roux type gastric bypass. 3. Sigmoid colon resection and reanastomosis. Occasional diverticula of the remnant sigmoid colon. No evidence of diverticulitis. 4. Low midline ventral hernia mesh repair. No evidence of recurrent hernia. 5. Mild splenomegaly. Aortic Atherosclerosis (ICD10-I70.0). Electronically Signed   By: ADelanna AhmadiM.D.   On: 09/27/2021 16:15    Procedures Procedures    Medications Ordered in ED Medications  magnesium sulfate IVPB 2 g 50 mL (has no administration in time range)  dexamethasone (DECADRON) injection 6 mg (6 mg Intravenous Given 09/27/21 1618)  metoCLOPramide (REGLAN) injection 10 mg (10 mg Intravenous Given 09/27/21 1617)  ketorolac (TORADOL) 30 MG/ML injection 30 mg (30 mg Intravenous Given 09/27/21 1632)  diphenhydrAMINE (BENADRYL) injection 12.5 mg (12.5 mg Intravenous Given 09/27/21 1617)  sodium chloride 0.9 % bolus 1,000 mL (1,000 mLs Intravenous New Bag/Given 09/27/21 1616)  iohexol (OMNIPAQUE) 300 MG/ML solution 100 mL (100 mLs Intravenous Contrast Given 09/27/21 1601)  diazepam (VALIUM) tablet 5 mg (5 mg Oral Given 09/27/21 1712)    ED Course/ Medical Decision Making/ A&P Clinical Course as of 09/27/21 1712  Wed Sep 27, 2021  1619 CT reviewed - moderate stool burden, no SBO, no acute findings [MT]  1654 Received sign out from Dr. TLangston Maskerpending re-assessment from migraine headache. Feels like prior headaches. Also with some abdominal pain with negative CT scan. Will re-assess [WS]  1712 No improvement of HA after initial migraine meds -we'll try valium and magnesium, I suspect there is strongly an occipital muscular component to the headache [MT]    Clinical Course  User Index [MT] Naseer Hearn, MCarola Rhine MD [WS] SCristie Hem MD                           Medical Decision Making Amount and/or Complexity of Data Reviewed Radiology: ordered.  Risk Prescription drug management.   This patient presents to the ED with concern for headache abdominal pain and bloating. This involves an extensive number of treatment options, and is a complaint that carries with it  a high risk of complications and morbidity.    The differential diagnosis includes migraine headache most likely for headache, given the typical pattern.  Migraine headache most likely for headache, given the typical pattern.  Less likely ICH or meningitis.  Do not feel that we need lumbar puncture or CT imaging at this time.   Differential diagnosis for abdominal distention and nausea is also broad, include bowel obstruction versus ileus versus colitis versus diverticulitis versus viral illness versus other.  She does not have rigidity or guarding on exam and does not appear toxic.  I have a lower suspicion for bowel perforation, though she reports she did have this issue in the past with her diverticulitis.  Co-morbidities that complicate the patient evaluation: Patient with a history of complex migraines and diabetes at high risk for gastroparesis and migraine complications.  I ordered and personally interpreted labs.  The pertinent results include: No significant findings on blood work.  I ordered imaging studies including CT abdomen pelvis with contrast I independently visualized and interpreted imaging which showed moderate the patient, no acute emergent findings I agree with the radiologist interpretation  I ordered medication including for migraine headache, nausea.  After the interventions noted above, I reevaluated the patient and found that they have: stayed the same         Final Clinical Impression(s) / ED Diagnoses Final diagnoses:  None    Rx / DC Orders ED  Discharge Orders     None         Cathline Dowen, Carola Rhine, MD 09/27/21 [phone]

## 2021-09-27 NOTE — Telephone Encounter (Signed)
Patient called to follow up with clinical staff about this and be sure she is good to keep her appointment on Friday, 09/29/21.

## 2021-09-27 NOTE — Telephone Encounter (Signed)
Pt called an informed that she is Buy and bill per her insurance and she is good her appointment on Friday

## 2021-09-27 NOTE — Telephone Encounter (Signed)
Patient left message with access nurse stating she spoke with insurance as long as the Dr is provided that onabatulinum toxina prior auth it should be covered.  [facility] shouldn't be [facility].  [facility] at [phone] is the preferred pharmacy.  Access note in mailbox.

## 2021-09-27 NOTE — ED Triage Notes (Signed)
Pt c/o abdominal discomfort for about a week, has had n/v/d over the past 2 days. Reports a migraine as well for about 5 days.

## 2021-09-27 NOTE — Telephone Encounter (Signed)
Patient requests Dr. [name] write out an appeal for Ozempic, which Patient states the PA for Ozempic was denied.  Patient states written appeal must be very detailed with information prior to the [facility] (numbers high prior to [facility] and then became low because of the Mounjaroand now will rise again without a GLP1 medication such as Ozempic-must give specific numbers)   Patient requests once written appeal for Ozempic is completed fax to Fax# [phone].  Patient requests Dr. [name] call Patient at ph# [phone] to discuss issues Patient has had at the office and the above request.

## 2021-09-28 ENCOUNTER — Other Ambulatory Visit (HOSPITAL_COMMUNITY): Payer: Self-pay

## 2021-09-29 ENCOUNTER — Ambulatory Visit (INDEPENDENT_AMBULATORY_CARE_PROVIDER_SITE_OTHER): Payer: BC Managed Care – PPO | Admitting: Neurology

## 2021-09-29 DIAGNOSIS — G43709 Chronic migraine without aura, not intractable, without status migrainosus: Secondary | ICD-10-CM | POA: Diagnosis not present

## 2021-09-29 MED ORDER — ONABOTULINUMTOXINA 100 UNITS IJ SOLR
200.0000 [IU] | Freq: Once | INTRAMUSCULAR | Status: AC
  Administered 2021-09-29: 155 [IU] via INTRAMUSCULAR

## 2021-09-29 NOTE — Progress Notes (Signed)
[facility]  ° °Procedure Note Botox ° °Attending: Dr. Ceci Taliaferro ° °Preoperative Diagnosis(es): Chronic migraine ° °Consent obtained from: The patient °Benefits discussed included, but were not limited to decreased muscle tightness, increased joint range of motion, and decreased pain.  Risk discussed included, but were not limited pain and discomfort, bleeding, bruising, excessive weakness, venous thrombosis, muscle atrophy and dysphagia.  Anticipated outcomes of the procedure as well as he risks and benefits of the alternatives to the procedure, and the roles and tasks of the personnel to be involved, were discussed with the patient, and the patient consents to the procedure and agrees to proceed. A copy of the patient medication guide was given to the patient which explains the blackbox warning. ° °Patients identity and treatment sites confirmed Yes.  . ° °Details of Procedure: °Skin was cleaned with alcohol. Prior to injection, the needle plunger was aspirated to make sure the needle was not within a blood vessel.  There was no blood retrieved on aspiration.   ° °Following is a summary of the muscles injected  And the amount of Botulinum toxin used: ° °Dilution °200 units of Botox was reconstituted with 4 ml of preservative free normal saline. °Time of reconstitution: At the time of the office visit (<30 minutes prior to injection)  ° °Injections  °155 total units of Botox was injected with a 30 gauge needle. ° °Injection Sites: °L occipitalis: 15 units- 3 sites  °R occiptalis: 15 units- 3 sites ° °L upper trapezius: 15 units- 3 sites °R upper trapezius: 15 units- 3 sits          °L paraspinal: 10 units- 2 sites °R paraspinal: 10 units- 2 sites ° °Face °L frontalis(2 injection sites):10 units   °R frontalis(2 injection sites):10 units         °L corrugator: 5 units   °R corrugator: 5 units           °Procerus: 5 units   °L temporalis: 20 units °R temporalis: 20 units  ° °Agent:  °200 units of botulinum Type  A (Onobotulinum Toxin type A) was reconstituted with 4 ml of preservative free normal saline.  °Time of reconstitution: At the time of the office visit (<30 minutes prior to injection)  ° ° ° Total injected (Units):  155 ° Total wasted (Units):  45 ° °Patient tolerated procedure well without complications.   °Reinjection is anticipated in 3 months. ° ° °

## 2021-09-30 ENCOUNTER — Other Ambulatory Visit: Payer: Self-pay | Admitting: Family Medicine

## 2021-09-30 NOTE — Telephone Encounter (Signed)
Pharmacy notified  Rx Available without authorization

## 2021-09-30 NOTE — Telephone Encounter (Signed)
PA Ozempic was send  Message from Plan Available without authorization.

## 2021-10-02 NOTE — Telephone Encounter (Signed)
Patient states: - She is still receiving denial letters for ozempic from her insurance company.  - She has told our office that a detailed appeal must be completed as described in Linda's message below on 08/16.  - Appeal letter needs to be completed by PCP and sent to the insurance company for processing. This is the only way her medication can be approved.    Patient requests: - PCP contact her at [phone] when appeal is completed and faxed to [phone].  - If this can not be handled by our office correctly, then she would like a referral to endocrinology.

## 2021-10-10 NOTE — Telephone Encounter (Signed)
Patient requests to be called at ph# [phone] for status of the appeal update (please see prior messages)

## 2021-10-13 ENCOUNTER — Telehealth: Payer: BC Managed Care – PPO | Admitting: Family Medicine

## 2021-10-13 DIAGNOSIS — B9689 Other specified bacterial agents as the cause of diseases classified elsewhere: Secondary | ICD-10-CM

## 2021-10-13 DIAGNOSIS — T3695XA Adverse effect of unspecified systemic antibiotic, initial encounter: Secondary | ICD-10-CM | POA: Diagnosis not present

## 2021-10-13 DIAGNOSIS — J019 Acute sinusitis, unspecified: Secondary | ICD-10-CM | POA: Diagnosis not present

## 2021-10-13 DIAGNOSIS — B379 Candidiasis, unspecified: Secondary | ICD-10-CM

## 2021-10-13 MED ORDER — DOXYCYCLINE HYCLATE 100 MG PO TABS: 100.0000 mg | ORAL_TABLET | Freq: Two times a day (BID) | ORAL | 0 refills | Status: AC

## 2021-10-13 MED ORDER — FLUCONAZOLE 150 MG PO TABS: 150.0000 mg | ORAL_TABLET | ORAL | 0 refills | Status: DC

## 2021-10-13 NOTE — Progress Notes (Signed)
Virtual Visit Consent   Heidi Holland, you are scheduled for a virtual visit with a [facility] provider today. Just as with appointments in the office, your consent must be obtained to participate. Your consent will be active for this visit and any virtual visit you may have with one of our providers in the next 365 days. If you have a MyChart account, a copy of this consent can be sent to you electronically.  As this is a virtual visit, video technology does not allow for your provider to perform a traditional examination. This may limit your provider's ability to fully assess your condition. If your provider identifies any concerns that need to be evaluated in person or the need to arrange testing (such as labs, EKG, etc.), we will make arrangements to do so. Although advances in technology are sophisticated, we cannot ensure that it will always work on either your end or our end. If the connection with a video visit is poor, the visit may have to be switched to a telephone visit. With either a video or telephone visit, we are not always able to ensure that we have a secure connection.  By engaging in this virtual visit, you consent to the provision of healthcare and authorize for your insurance to be billed (if applicable) for the services provided during this visit. Depending on your insurance coverage, you may receive a charge related to this service.  I need to obtain your verbal consent now. Are you willing to proceed with your visit today? Heidi Holland has provided verbal consent on 10/13/2021 for a virtual visit (video or telephone). Perlie Mayo, NP  Date: 10/13/2021 10:32 AM  Virtual Visit via Video Note   I, Perlie Mayo, connected with  Heidi Holland  ([id number], 02/09/1976) on 10/13/21 at 10:30 AM EDT by a video-enabled telemedicine application and verified that I am speaking with the correct person using two identifiers.  Location: Patient: Virtual Visit Location Patient:  Home Provider: Virtual Visit Location Provider: Home Office   I discussed the limitations of evaluation and management by telemedicine and the availability of in person appointments. The patient expressed understanding and agreed to proceed.    History of Present Illness: Heidi Holland is a 46 y.o. who identifies as a female who was assigned female at birth, and is being seen today for worsening sinus congestion and pressure. Was treated for sinus infection in office back on 8/2, waited 5 days prior to starting Augmentin- completed it seemed to get better but worsened again. Also developed a yeast infection. Reports pressure in sinuses and pain in left side of face and ear. Denies chest pain, SHOB, sore throat, headache, fevers or chills.   Problems:  Patient Active Problem List   Diagnosis Date Noted   Paresthesia 09/13/2021   CIN I (cervical intraepithelial neoplasia I) 03/03/2021   LSIL pap smear of cervix/human papillomavirus (HPV) positive 10/24/2020   Family history of early CAD 10/24/2020   B12 deficiency 07/26/2020   Somatic dysfunction of spine, cervical 07/21/2020   Status post left hip replacement 03/12/2019   T2DM (type 2 diabetes mellitus) ([place]) 07/31/2018   Dyslipidemia associated with type 2 diabetes mellitus ([place]) 07/31/2018   Allergic rhinitis 07/31/2018   Mild intermittent asthma, uncomplicated 82/99/3716   GERD (gastroesophageal reflux disease) 07/31/2018   S/P gastric bypass 07/31/2018   Migraine 07/31/2018   Anxiety 07/31/2018   Depression, major, single episode, complete remission ([place]) 07/31/2018   Insomnia 96/78/9381   Umbilical hernia 01/75/1025  Iron deficiency anemia 06/09/2018   Cervicogenic headache 05/12/2018   Low back pain 05/12/2018   Polyarthralgia 05/12/2018    Allergies:  Allergies  Allergen Reactions   Ciprofloxacin Itching   Medications:  Current Outpatient Medications:    albuterol (VENTOLIN HFA) 108 (90 Base) MCG/ACT inhaler, INHALE 2  PUFFS BY MOUTH EVERY 6 HOURS AS NEEDED FOR WHEEZE OR SHORTNESS OF BREATH, Disp: 8.5 each, Rfl: 2   amoxicillin-clavulanate (AUGMENTIN) 875-125 MG tablet, Take 1 tablet by mouth 2 (two) times daily., Disp: 14 tablet, Rfl: 0   azelastine (ASTELIN) 0.1 % nasal spray, Place 2 sprays into both nostrils 2 (two) times daily. Use in each nostril as directed, Disp: 30 mL, Rfl: 5   blood glucose meter kit and supplies KIT, Dispense based on patient and insurance preference. Use up to four times daily as directed. (FOR ICD-9 250.00, 250.01)., Disp: 1 each, Rfl: 0   buPROPion (WELLBUTRIN SR) 150 MG 12 hr tablet, Take 150 mg by mouth daily., Disp: , Rfl:    clonazePAM (KLONOPIN) 0.5 MG tablet, TAKE ONE TABLET BY MOUTH TWICE DAILY AS NEEDED FOR ANXIETY, Disp: 60 tablet, Rfl: 0   Continuous Blood Gluc Receiver (FREESTYLE LIBRE READER) DEVI, 1 Device by Does not apply route daily as needed (to check blood sugar)., Disp: 1 Device, Rfl: 0   Continuous Blood Gluc Sensor (FREESTYLE LIBRE SENSOR SYSTEM) MISC, Apply 1 sensor to skin every 14 days., Disp: 2 each, Rfl: 11   lumateperone tosylate (CAPLYTA) 42 MG capsule, , Disp: , Rfl:    metFORMIN (GLUCOPHAGE) 1000 MG tablet, TAKE 1 TABLET BY MOUTH TWICE A DAY WITH A MEAL, Disp: 180 tablet, Rfl: 3   ondansetron (ZOFRAN-ODT) 4 MG disintegrating tablet, DISSOLVE ONE TABLET ON TOP OF TONGUE EVERY EIGHT HOURS AS NEEDED FOR NAUSEA OR VOMITING, Disp: 20 tablet, Rfl: 0   pantoprazole (PROTONIX) 40 MG tablet, TAKE 1 TABLET BY MOUTH EVERY DAY IN THE EVENING, Disp: 90 tablet, Rfl: 0   predniSONE (DELTASONE) 50 MG tablet, Take 1 tablet daily for 5 days., Disp: 5 tablet, Rfl: 0   pregabalin (LYRICA) 150 MG capsule, TAKE 1 CAPSULE BY MOUTH TWICE A DAY, Disp: 60 capsule, Rfl: 5   QUEtiapine (SEROQUEL) 100 MG tablet, Take by mouth., Disp: , Rfl:    rosuvastatin (CRESTOR) 20 MG tablet, Take 1 tablet (20 mg total) by mouth daily., Disp: 90 tablet, Rfl: 3   Semaglutide,0.25 or 0.5MG/DOS,  (OZEMPIC, 0.25 OR 0.5 MG/DOSE,) 2 MG/3ML SOPN, Inject 0.25 mg into the skin once a week. x 4 weeks and then increase to 0.88m weekly., Disp: 3 mL, Rfl: 1   SUMAtriptan (TOSYMRA) 10 MG/ACT SOLN, PLACE 1 SPRAY INTO THE NOSE EVERY HOUR AS NEEDED (MAXIMUM 3 SPRAYS IN 24 HOURS), Disp: 6 each, Rfl: 5   tirzepatide (MOUNJARO) 7.5 MG/0.5ML Pen, Inject 7.5 mg into the skin once a week., Disp: 2 mL, Rfl: 0   tiZANidine (ZANAFLEX) 4 MG tablet, TAKE ONE TABLET BY MOUTH THREE TIMES DAILY, Disp: 90 tablet, Rfl: 0   traMADol (ULTRAM) 50 MG tablet, TAKE ONE TABLET BY MOUTH EVERY EIGHT HOURS AS NEEDED, Disp: 90 tablet, Rfl: 0   zolpidem (AMBIEN) 5 MG tablet, Take 1 tablet (5 mg total) by mouth at bedtime as needed for sleep., Disp: 30 tablet, Rfl: 5   zonisamide (ZONEGRAN) 100 MG capsule, Take 1 capsule (100 mg total) by mouth daily., Disp: 30 capsule, Rfl: 5  Observations/Objective: Patient is well-developed, well-nourished in no acute distress.  Resting comfortably  at home.  Head is normocephalic, atraumatic.  No labored breathing.  Speech is clear and coherent with logical content.  Patient is alert and oriented at baseline.   Assessment and Plan: 1. Acute bacterial sinusitis  - doxycycline (VIBRA-TABS) 100 MG tablet; Take 1 tablet (100 mg total) by mouth 2 (two) times daily for 10 days.  Dispense: 20 tablet; Refill: 0  2. Antibiotic-induced yeast infection  - fluconazole (DIFLUCAN) 150 MG tablet; Take 1 tablet (150 mg total) by mouth as directed. May repeat in 3 days, then last one once antibiotic finished  Dispense: 3 tablet; Refill: 0  -rest -hydrate -start meds today, take as directed and fully complete them  -if worse in next 72 hours or no improvement you need to be seen in person   Reviewed side effects, risks and benefits of medication.    Patient acknowledged agreement and understanding of the plan.   Past Medical, Surgical, Social History, Allergies, and Medications have been  Reviewed.    Follow Up Instructions: I discussed the assessment and treatment plan with the patient. The patient was provided an opportunity to ask questions and all were answered. The patient agreed with the plan and demonstrated an understanding of the instructions.  A copy of instructions were sent to the patient via MyChart unless otherwise noted below.    The patient was advised to call back or seek an in-person evaluation if the symptoms worsen or if the condition fails to improve as anticipated.  Time:  I spent 10 minutes with the patient via telehealth technology discussing the above problems/concerns.    Perlie Mayo, NP

## 2021-10-13 NOTE — Patient Instructions (Signed)
[name], thank you for joining Perlie Mayo, NP for today's virtual visit.  While this provider is not your primary care provider (PCP), if your PCP is located in our provider database this encounter information will be shared with them immediately following your visit.  Consent: (Patient) Heidi Holland provided verbal consent for this virtual visit at the beginning of the encounter.  Current Medications:  Current Outpatient Medications:    doxycycline (VIBRA-TABS) 100 MG tablet, Take 1 tablet (100 mg total) by mouth 2 (two) times daily for 10 days., Disp: 20 tablet, Rfl: 0   fluconazole (DIFLUCAN) 150 MG tablet, Take 1 tablet (150 mg total) by mouth as directed. May repeat in 3 days, then last one once antibiotic finished, Disp: 3 tablet, Rfl: 0   albuterol (VENTOLIN HFA) 108 (90 Base) MCG/ACT inhaler, INHALE 2 PUFFS BY MOUTH EVERY 6 HOURS AS NEEDED FOR WHEEZE OR SHORTNESS OF BREATH, Disp: 8.5 each, Rfl: 2   azelastine (ASTELIN) 0.1 % nasal spray, Place 2 sprays into both nostrils 2 (two) times daily. Use in each nostril as directed, Disp: 30 mL, Rfl: 5   blood glucose meter kit and supplies KIT, Dispense based on patient and insurance preference. Use up to four times daily as directed. (FOR ICD-9 250.00, 250.01)., Disp: 1 each, Rfl: 0   buPROPion (WELLBUTRIN SR) 150 MG 12 hr tablet, Take 150 mg by mouth daily., Disp: , Rfl:    clonazePAM (KLONOPIN) 0.5 MG tablet, TAKE ONE TABLET BY MOUTH TWICE DAILY AS NEEDED FOR ANXIETY, Disp: 60 tablet, Rfl: 0   Continuous Blood Gluc Receiver (FREESTYLE LIBRE READER) DEVI, 1 Device by Does not apply route daily as needed (to check blood sugar)., Disp: 1 Device, Rfl: 0   Continuous Blood Gluc Sensor (FREESTYLE LIBRE SENSOR SYSTEM) MISC, Apply 1 sensor to skin every 14 days., Disp: 2 each, Rfl: 11   lumateperone tosylate (CAPLYTA) 42 MG capsule, , Disp: , Rfl:    metFORMIN (GLUCOPHAGE) 1000 MG tablet, TAKE 1 TABLET BY MOUTH TWICE A DAY WITH A MEAL, Disp:  180 tablet, Rfl: 3   ondansetron (ZOFRAN-ODT) 4 MG disintegrating tablet, DISSOLVE ONE TABLET ON TOP OF TONGUE EVERY EIGHT HOURS AS NEEDED FOR NAUSEA OR VOMITING, Disp: 20 tablet, Rfl: 0   pantoprazole (PROTONIX) 40 MG tablet, TAKE 1 TABLET BY MOUTH EVERY DAY IN THE EVENING, Disp: 90 tablet, Rfl: 0   predniSONE (DELTASONE) 50 MG tablet, Take 1 tablet daily for 5 days., Disp: 5 tablet, Rfl: 0   pregabalin (LYRICA) 150 MG capsule, TAKE 1 CAPSULE BY MOUTH TWICE A DAY, Disp: 60 capsule, Rfl: 5   QUEtiapine (SEROQUEL) 100 MG tablet, Take by mouth., Disp: , Rfl:    rosuvastatin (CRESTOR) 20 MG tablet, Take 1 tablet (20 mg total) by mouth daily., Disp: 90 tablet, Rfl: 3   Semaglutide,0.25 or 0.5MG/DOS, (OZEMPIC, 0.25 OR 0.5 MG/DOSE,) 2 MG/3ML SOPN, Inject 0.25 mg into the skin once a week. x 4 weeks and then increase to 0.73m weekly., Disp: 3 mL, Rfl: 1   SUMAtriptan (TOSYMRA) 10 MG/ACT SOLN, PLACE 1 SPRAY INTO THE NOSE EVERY HOUR AS NEEDED (MAXIMUM 3 SPRAYS IN 24 HOURS), Disp: 6 each, Rfl: 5   tirzepatide (MOUNJARO) 7.5 MG/0.5ML Pen, Inject 7.5 mg into the skin once a week., Disp: 2 mL, Rfl: 0   tiZANidine (ZANAFLEX) 4 MG tablet, TAKE ONE TABLET BY MOUTH THREE TIMES DAILY, Disp: 90 tablet, Rfl: 0   traMADol (ULTRAM) 50 MG tablet, TAKE ONE TABLET BY MOUTH  EVERY EIGHT HOURS AS NEEDED, Disp: 90 tablet, Rfl: 0   zolpidem (AMBIEN) 5 MG tablet, Take 1 tablet (5 mg total) by mouth at bedtime as needed for sleep., Disp: 30 tablet, Rfl: 5   zonisamide (ZONEGRAN) 100 MG capsule, Take 1 capsule (100 mg total) by mouth daily., Disp: 30 capsule, Rfl: 5   Medications ordered in this encounter:  Meds ordered this encounter  Medications   doxycycline (VIBRA-TABS) 100 MG tablet    Sig: Take 1 tablet (100 mg total) by mouth 2 (two) times daily for 10 days.    Dispense:  20 tablet    Refill:  0    Order Specific Question:   Supervising Provider    Answer:   MILLER, BRIAN [[id number]]   fluconazole (DIFLUCAN) 150 MG tablet     Sig: Take 1 tablet (150 mg total) by mouth as directed. May repeat in 3 days, then last one once antibiotic finished    Dispense:  3 tablet    Refill:  0    Order Specific Question:   Supervising Provider    Answer:   Sabra Heck, [place]     *If you need refills on other medications prior to your next appointment, please contact your pharmacy*  Follow-Up: Call back or seek an in-person evaluation if the symptoms worsen or if the condition fails to improve as anticipated.  Other Instructions -rest -hydrate -start meds today, take as directed and fully complete them  -if worse in next 72 hours or no improvement you need to be seen in person   If you have been instructed to have an in-person evaluation today at a local Urgent Care facility, please use the link below. It will take you to a list of all of our available [facility], including address, phone number and hours of operation. Please do not delay care.  [facility]  If you or a family member do not have a primary care provider, use the link below to schedule a visit and establish care. When you choose a [facility] primary care physician or advanced practice provider, you gain a long-term partner in health. Find a Primary Care Provider  Learn more about [facility]'s in-office and virtual care options: [facility]

## 2021-10-17 ENCOUNTER — Other Ambulatory Visit: Payer: Self-pay | Admitting: Family Medicine

## 2021-10-18 ENCOUNTER — Other Ambulatory Visit: Payer: Self-pay | Admitting: Neurology

## 2021-10-18 DIAGNOSIS — F411 Generalized anxiety disorder: Secondary | ICD-10-CM | POA: Diagnosis not present

## 2021-10-18 DIAGNOSIS — F3162 Bipolar disorder, current episode mixed, moderate: Secondary | ICD-10-CM | POA: Diagnosis not present

## 2021-10-18 DIAGNOSIS — F4312 Post-traumatic stress disorder, chronic: Secondary | ICD-10-CM | POA: Diagnosis not present

## 2021-10-23 DIAGNOSIS — R300 Dysuria: Secondary | ICD-10-CM | POA: Diagnosis not present

## 2021-10-23 DIAGNOSIS — B3731 Acute candidiasis of vulva and vagina: Secondary | ICD-10-CM | POA: Diagnosis not present

## 2021-10-23 DIAGNOSIS — N898 Other specified noninflammatory disorders of vagina: Secondary | ICD-10-CM | POA: Diagnosis not present

## 2021-10-23 DIAGNOSIS — R0981 Nasal congestion: Secondary | ICD-10-CM | POA: Diagnosis not present

## 2021-10-26 ENCOUNTER — Ambulatory Visit: Payer: BC Managed Care – PPO | Admitting: Family Medicine

## 2021-10-26 ENCOUNTER — Telehealth: Payer: Self-pay | Admitting: Family Medicine

## 2021-10-26 NOTE — Telephone Encounter (Signed)
Patient states she will not have access to health insurance for 2 months and requests the following RX's be sent asap while Patient still has insurance (insurance ends 10/27/21)  LAST APPOINTMENT DATE:  Please schedule appointment if longer than 1 year  09/13/21  NEXT APPOINTMENT DATE:  MEDICATION:  rosuvastatin (CRESTOR) 20 MG tablet  AND    metFORMIN (GLUCOPHAGE) 1000 MG tablet  AND clonazePAM (KLONOPIN) 0.5 MG tablet  AND  Continuous Blood Gluc Receiver (FREESTYLE LIBRE READER) DEVI  AND  Continuous Blood Gluc Sensor (FREESTYLE LIBRE SENSOR SYSTEM) MISC     Is the patient out of medication? Almost  PHARMACY:  [id number] - [place], [place] - [address] Phone:  [phone]  Fax:  [phone]      Let patient know to contact pharmacy at the end of the day to make sure medication is ready.  Please notify patient to allow 48-72 hours to process

## 2021-10-27 DIAGNOSIS — F411 Generalized anxiety disorder: Secondary | ICD-10-CM | POA: Diagnosis not present

## 2021-10-27 DIAGNOSIS — F4312 Post-traumatic stress disorder, chronic: Secondary | ICD-10-CM | POA: Diagnosis not present

## 2021-10-27 DIAGNOSIS — F3162 Bipolar disorder, current episode mixed, moderate: Secondary | ICD-10-CM | POA: Diagnosis not present

## 2021-10-31 ENCOUNTER — Other Ambulatory Visit: Payer: Self-pay | Admitting: Family Medicine

## 2021-11-02 ENCOUNTER — Other Ambulatory Visit: Payer: Self-pay | Admitting: *Deleted

## 2021-11-02 MED ORDER — FREESTYLE LIBRE SENSOR SYSTEM MISC: 11 refills | Status: DC

## 2021-11-02 MED ORDER — FREESTYLE LIBRE READER DEVI: 1.0000 | Freq: Every day | 0 refills | Status: DC | PRN

## 2021-11-02 MED ORDER — METFORMIN HCL 1000 MG PO TABS: ORAL_TABLET | ORAL | 1 refills | Status: DC

## 2021-11-02 MED ORDER — ROSUVASTATIN CALCIUM 20 MG PO TABS: 20.0000 mg | ORAL_TABLET | Freq: Every day | ORAL | 3 refills | Status: DC

## 2021-11-02 NOTE — Telephone Encounter (Signed)
Prescription send to pharmacy Patient requesting Rx Klonopin

## 2021-11-02 NOTE — Telephone Encounter (Signed)
This was sent in yesterday.  Heidi Holland. Jerline Pain, MD 11/02/2021 2:00 PM

## 2021-11-06 ENCOUNTER — Encounter: Payer: Self-pay | Admitting: *Deleted

## 2021-11-27 ENCOUNTER — Encounter (HOSPITAL_COMMUNITY): Payer: Self-pay

## 2021-11-27 ENCOUNTER — Ambulatory Visit (HOSPITAL_COMMUNITY)
Admission: EM | Admit: 2021-11-27 | Discharge: 2021-11-27 | Disposition: A | Discharge status: Home / Self Care | Payer: BC Managed Care – PPO | Attending: Emergency Medicine | Admitting: Emergency Medicine

## 2021-11-27 DIAGNOSIS — R109 Unspecified abdominal pain: Secondary | ICD-10-CM

## 2021-11-27 LAB — POCT URINALYSIS DIPSTICK, ED / UC
Bilirubin Urine: NEGATIVE
Glucose, UA: NEGATIVE mg/dL
Hgb urine dipstick: NEGATIVE
Ketones, ur: NEGATIVE mg/dL
Leukocytes,Ua: NEGATIVE
Nitrite: NEGATIVE
Protein, ur: NEGATIVE mg/dL
Specific Gravity, Urine: 1.015 (ref 1.005–1.030)
Urobilinogen, UA: 0.2 mg/dL (ref 0.0–1.0)
pH: 8.5 — ABNORMAL HIGH (ref 5.0–8.0)

## 2021-11-27 LAB — POC URINE PREG, ED: Preg Test, Ur: NEGATIVE

## 2021-11-27 NOTE — ED Triage Notes (Signed)
Pt c/o sharp upper abdominal pain with distention, bloating, and nausea since Friday. States took mylanta with little relief. States hx of same, had gastric bypass in 20/16. States was taking a lot of advil last week for discomfort.

## 2021-11-27 NOTE — ED Provider Notes (Signed)
Presents with abdominal pain, 8/10 Reports distention, bloating, nausea x 3 days Pain does not radiate.  She had been taking 800 mg ibuprofen every 6 hours for the last week History of gastric bypass in 2016, bowel resection from obstruction  Tried mylanta without any relief. Drinking fluids made pain worse. At first wasn't having BM but had a little today No vomiting. No fevers.  She is very tender on exam. Appears uncomfortable. Stated she wanted to go to the ED but doesn't have insurance  With patient symptoms and history, recommend she be evaluated in the ED for higher level of care. Likely needs advanced imaging. Patient verbalizes understanding, discharged to ED via POV with sister.   Tessy Pawelski, Vernice Jefferson 11/27/21 2031

## 2021-11-27 NOTE — Discharge Instructions (Signed)
D/C to ED 

## 2021-11-27 NOTE — ED Notes (Signed)
Patient is being discharged from the Urgent Care and sent to the Emergency Department via POC . Per Wells Guiles, patient is in need of higher level of care due to need of further evaluation. Patient is aware and verbalizes understanding of plan of care.  Vitals:   11/27/21 2006  BP: (!) 157/95  Pulse: 83  Resp: 18  Temp: 99.3 F (37.4 C)  SpO2: 98%

## 2021-11-28 ENCOUNTER — Emergency Department (HOSPITAL_COMMUNITY): Payer: Self-pay

## 2021-11-28 ENCOUNTER — Emergency Department (HOSPITAL_COMMUNITY)
Admission: EM | Admit: 2021-11-28 | Discharge: 2021-11-29 | Disposition: A | Discharge status: Home / Self Care | Payer: Self-pay | Attending: Emergency Medicine | Admitting: Emergency Medicine

## 2021-11-28 ENCOUNTER — Encounter (HOSPITAL_COMMUNITY): Payer: Self-pay

## 2021-11-28 DIAGNOSIS — R1084 Generalized abdominal pain: Secondary | ICD-10-CM | POA: Insufficient documentation

## 2021-11-28 DIAGNOSIS — R109 Unspecified abdominal pain: Secondary | ICD-10-CM | POA: Diagnosis not present

## 2021-11-28 DIAGNOSIS — N281 Cyst of kidney, acquired: Secondary | ICD-10-CM | POA: Diagnosis not present

## 2021-11-28 DIAGNOSIS — R079 Chest pain, unspecified: Secondary | ICD-10-CM | POA: Diagnosis not present

## 2021-11-28 DIAGNOSIS — Z79899 Other long term (current) drug therapy: Secondary | ICD-10-CM | POA: Insufficient documentation

## 2021-11-28 LAB — I-STAT BETA HCG BLOOD, ED (MC, WL, AP ONLY): I-stat hCG, quantitative: <5 m[IU]/mL (ref <5)

## 2021-11-28 LAB — COMPREHENSIVE METABOLIC PANEL
ALT: 16 U/L (ref 0–44)
AST: 14 U/L — ABNORMAL LOW (ref 15–41)
Albumin: 3.8 g/dL (ref 3.5–5.0)
Alkaline Phosphatase: 63 U/L (ref 38–126)
Anion gap: 5 (ref 5–15)
BUN: 9 mg/dL (ref 6–20)
CO2: 24 mmol/L (ref 22–32)
Calcium: 8.9 mg/dL (ref 8.9–10.3)
Chloride: 107 mmol/L (ref 98–111)
Creatinine, Ser: 0.68 mg/dL (ref 0.44–1.00)
GFR, Estimated: >60 mL/min (ref >60)
Glucose, Bld: 171 mg/dL — ABNORMAL HIGH (ref 70–99)
Potassium: 3.7 mmol/L (ref 3.5–5.1)
Sodium: 136 mmol/L (ref 135–145)
Total Bilirubin: 0.4 mg/dL (ref 0.3–1.2)
Total Protein: 6.8 g/dL (ref 6.5–8.1)

## 2021-11-28 LAB — CBC
HCT: 36.5 % (ref 36.0–46.0)
Hemoglobin: 11.5 g/dL — ABNORMAL LOW (ref 12.0–15.0)
MCH: 25.1 pg — ABNORMAL LOW (ref 26.0–34.0)
MCHC: 31.5 g/dL (ref 30.0–36.0)
MCV: 79.7 fL — ABNORMAL LOW (ref 80.0–100.0)
Platelets: 276 10*3/uL (ref 150–400)
RBC: 4.58 MIL/uL (ref 3.87–5.11)
RDW: 13.8 % (ref 11.5–15.5)
WBC: 5.5 10*3/uL (ref 4.0–10.5)
nRBC: 0 % (ref 0.0–0.2)

## 2021-11-28 LAB — LIPASE, BLOOD: Lipase: 33 U/L (ref 11–51)

## 2021-11-28 MED ORDER — IOHEXOL 300 MG/ML  SOLN
100.0000 mL | Freq: Once | INTRAMUSCULAR | Status: AC | PRN
  Administered 2021-11-28: 100 mL via INTRAVENOUS

## 2021-11-28 NOTE — ED Notes (Signed)
Need IV for IV

## 2021-11-28 NOTE — ED Notes (Signed)
Pt taken to CT. IV started and scan completed. Pt placed back in the waiting room with IV still in place. Communicated with the pt to not leave with the IV still in place.  

## 2021-11-28 NOTE — ED Provider Triage Note (Signed)
Emergency Medicine Provider Triage Evaluation Note  Heidi Holland , a 46 y.o. female  was evaluated in triage.  Pt complains of abd pain. Hx of gastric bypass and hernia surgery, also diverticulitis --> colon resection.  States she also has some chest discomfort.   States she stopped passing gas over the past 1-2 days. Last BM was yesterday AM.   Review of Systems  Positive: Abd pain, NV Negative: Fever   Physical Exam  BP (!) 139/91 (BP Location: Left Arm)   Pulse 90   Temp 98.2 F (36.8 C) (Oral)   Resp 18   Ht 5\' 3"  (1.6 m)   Wt 86.2 kg   LMP 11/20/2021   SpO2 91%   BMI 33.66 kg/m  Gen:   Awake, no distress  Resp:  Normal effort  MSK:   Moves extremities without difficulty  Other:  Mild diffuse abd TTP.   Medical Decision Making  Medically screening exam initiated at 9:15 PM.  Appropriate orders placed.  Heidi Holland was informed that the remainder of the evaluation will be completed by another provider, this initial triage assessment does not replace that evaluation, and the importance of remaining in the ED until their evaluation is complete.  CT AP  Some chest discomfort I suspect from abd -- EKG and DG chest.    Tedd Sias, [place] 11/28/21 2120

## 2021-11-28 NOTE — ED Triage Notes (Signed)
Pt states that she has been having abd pain with nausea and distension since Friday.

## 2021-11-29 LAB — URINALYSIS, ROUTINE W REFLEX MICROSCOPIC
Bilirubin Urine: NEGATIVE
Glucose, UA: NEGATIVE mg/dL
Hgb urine dipstick: NEGATIVE
Ketones, ur: NEGATIVE mg/dL
Leukocytes,Ua: NEGATIVE
Nitrite: NEGATIVE
Protein, ur: NEGATIVE mg/dL
Specific Gravity, Urine: 1.032 — ABNORMAL HIGH (ref 1.005–1.030)
pH: 7 (ref 5.0–8.0)

## 2021-11-29 MED ORDER — ALUM & MAG HYDROXIDE-SIMETH 200-200-20 MG/5ML PO SUSP
30.0000 mL | Freq: Once | ORAL | Status: AC
  Administered 2021-11-29: 30 mL via ORAL
  Filled 2021-11-29: qty 30

## 2021-11-29 MED ORDER — ONDANSETRON 8 MG PO TBDP
8.0000 mg | ORAL_TABLET | Freq: Once | ORAL | Status: AC
  Administered 2021-11-29: 8 mg via ORAL
  Filled 2021-11-29: qty 1

## 2021-11-29 MED ORDER — DICYCLOMINE HCL 20 MG PO TABS: 20.0000 mg | ORAL_TABLET | Freq: Two times a day (BID) | ORAL | 0 refills | Status: DC

## 2021-11-29 MED ORDER — DICYCLOMINE HCL 10 MG/ML IM SOLN
20.0000 mg | Freq: Once | INTRAMUSCULAR | Status: AC
  Administered 2021-11-29: 20 mg via INTRAMUSCULAR
  Filled 2021-11-29: qty 2

## 2021-11-29 NOTE — ED Notes (Signed)
Pt is complaining of no change in pain level

## 2021-11-29 NOTE — ED Provider Notes (Signed)
[facility] DEPT Provider Note   CSN: [id number] Arrival date & time: 11/28/21  2049     History  Chief Complaint  Patient presents with   Abdominal Pain    Stefanny Pieri is a 46 y.o. female.  The history is provided by the patient.  Abdominal Pain Pain location:  Generalized Pain quality comment:  Cramping Pain radiates to:  Does not radiate Pain severity:  Moderate Onset quality:  Gradual Duration:  5 days Timing:  Constant Progression:  Unchanged Chronicity:  New Context: not trauma   Relieved by:  Nothing Worsened by:  Nothing Ineffective treatments:  None tried Associated symptoms: no chest pain, no diarrhea, no dysuria, no fever and no vomiting   Risk factors: multiple surgeries   Patient s/p gastric bypass in 2016 with pain since Friday and only a small BM yesterday.  Has had gas but not passing it.       Home Medications Prior to Admission medications   Medication Sig Start Date End Date Taking? Authorizing Provider  dicyclomine (BENTYL) 20 MG tablet Take 1 tablet (20 mg total) by mouth 2 (two) times daily. 11/29/21  Yes Breane Grunwald, MD  albuterol (VENTOLIN HFA) 108 (90 Base) MCG/ACT inhaler INHALE 2 PUFFS BY MOUTH EVERY 6 HOURS AS NEEDED FOR WHEEZE OR SHORTNESS OF BREATH 04/10/21   Vivi Barrack, MD  azelastine (ASTELIN) 0.1 % nasal spray Place 2 sprays into both nostrils 2 (two) times daily. Use in each nostril as directed 07/26/20   Vivi Barrack, MD  blood glucose meter kit and supplies KIT Dispense based on patient and insurance preference. Use up to four times daily as directed. (FOR ICD-9 250.00, 250.01). 07/31/18   Vivi Barrack, MD  buPROPion [facility] SR) 150 MG 12 hr tablet Take 150 mg by mouth daily.    [provider]  clonazePAM (KLONOPIN) 0.5 MG tablet TAKE ONE TABLET BY MOUTH TWICE DAILY AS NEEDED FOR ANXIETY 11/01/21   Vivi Barrack, MD  Continuous Blood Gluc Receiver (FREESTYLE LIBRE READER) DEVI  1 Device by Does not apply route daily as needed (to check blood sugar). 11/02/21   Vivi Barrack, MD  Continuous Blood Gluc Sensor (FREESTYLE LIBRE SENSOR SYSTEM) MISC Apply 1 sensor to skin every 14 days. 11/02/21   Vivi Barrack, MD  fluconazole (DIFLUCAN) 150 MG tablet Take 1 tablet (150 mg total) by mouth as directed. May repeat in 3 days, then last one once antibiotic finished 10/13/21   Perlie Mayo, NP  lumateperone tosylate (CAPLYTA) 42 MG capsule  01/05/21   [provider]  metFORMIN (GLUCOPHAGE) 1000 MG tablet TAKE 1 TABLET BY MOUTH TWICE A DAY WITH A MEAL 11/02/21   Vivi Barrack, MD  ondansetron (ZOFRAN-ODT) 4 MG disintegrating tablet DISSOLVE ONE TABLET ON TOP OF TONGUE EVERY EIGHT HOURS AS NEEDED FOR NAUSEA OR VOMITING 08/21/21   Vivi Barrack, MD  pantoprazole (PROTONIX) 40 MG tablet TAKE 1 TABLET BY MOUTH EVERY DAY IN THE EVENING 07/10/21   Vivi Barrack, MD  pregabalin (LYRICA) 150 MG capsule TAKE 1 CAPSULE BY MOUTH TWICE A DAY 08/21/21   Vivi Barrack, MD  QUEtiapine (SEROQUEL) 100 MG tablet Take by mouth. 05/03/21   [provider]  rosuvastatin (CRESTOR) 20 MG tablet Take 1 tablet (20 mg total) by mouth daily. 11/02/21   Vivi Barrack, MD  Semaglutide,0.25 or 0.5MG/DOS, (OZEMPIC, 0.25 OR 0.5 MG/DOSE,) 2 MG/3ML SOPN Inject 0.25 mg into the  skin once a week. x 4 weeks and then increase to 0.5m weekly. 09/20/21   PVivi Barrack MD  SUMAtriptan (TOSYMRA) 10 MG/ACT SOLN PLACE 1 SPRAY INTO THE NOSE EVERY HOUR AS NEEDED (MAXIMUM 3 SPRAYS IN 24 HOURS) 03/15/21   JTomi Likens Adam R, DO  tirzepatide ([facility] 7.5 MG/0.5ML Pen Inject 7.5 mg into the skin once a week. 09/07/21   PVivi Barrack MD  tiZANidine (ZANAFLEX) 4 MG tablet TAKE ONE TABLET BY MOUTH THREE TIMES DAILY 10/17/21   PVivi Barrack MD  traMADol (ULTRAM) 50 MG tablet TAKE ONE TABLET BY MOUTH EVERY EIGHT HOURS AS NEEDED 08/21/21   PVivi Barrack MD  zolpidem (AMBIEN) 5 MG tablet Take 1 tablet (5 mg total)  by mouth at bedtime as needed for sleep. 04/18/21   PVivi Barrack MD  zonisamide (ZONEGRAN) 100 MG capsule TAKE ONE CAPSULE BY MOUTH ONE TIME DAILY 10/20/21   JPieter Partridge DO      Allergies    Ciprofloxacin    Review of Systems   Review of Systems  Constitutional:  Negative for fever.  HENT:  Negative for facial swelling.   Eyes:  Negative for photophobia and redness.  Respiratory:  Negative for wheezing and stridor.   Cardiovascular:  Negative for chest pain.  Gastrointestinal:  Positive for abdominal pain. Negative for diarrhea and vomiting.  Genitourinary:  Negative for dysuria.  All other systems reviewed and are negative.   Physical Exam Updated Vital Signs BP (!) 170/86   Pulse 76   Temp 98.3 F (36.8 C) (Oral)   Resp 18   Ht _0  (1.6 m)   Wt 86.2 kg   LMP 11/20/2021   SpO2 99%   BMI 33.66 kg/m  Physical Exam Vitals and nursing note reviewed.  Constitutional:      General: She is not in acute distress.    Appearance: She is well-developed.  HENT:     Head: Normocephalic and atraumatic.     Nose: Nose normal.  Eyes:     Pupils: Pupils are equal, round, and reactive to light.  Cardiovascular:     Rate and Rhythm: Normal rate and regular rhythm.     Pulses: Normal pulses.     Heart sounds: Normal heart sounds.  Pulmonary:     Effort: Pulmonary effort is normal. No respiratory distress.     Breath sounds: Normal breath sounds.  Abdominal:     General: Abdomen is flat and scaphoid. There is no distension.     Palpations: Abdomen is soft.     Tenderness: There is no abdominal tenderness. There is no guarding or rebound.     Comments: Gassy throughout   Genitourinary:    Vagina: No vaginal discharge.  Musculoskeletal:        General: Normal range of motion.     Cervical back: Neck supple.  Skin:    General: Skin is warm and dry.     Capillary Refill: Capillary refill takes less than 2 seconds.     Findings: No erythema or rash.  Neurological:      General: No focal deficit present.     Mental Status: She is alert and oriented to person, place, and time.     Deep Tendon Reflexes: Reflexes normal.  Psychiatric:        Mood and Affect: Mood normal.        Behavior: Behavior normal.     ED Results / Procedures / Treatments   Labs (  all labs ordered are listed, but only abnormal results are displayed) Results for orders placed or performed during the hospital encounter of 11/28/21  Lipase, blood  Result Value Ref Range   Lipase 33 11 - 51 U/L  Comprehensive metabolic panel  Result Value Ref Range   Sodium 136 135 - 145 mmol/L   Potassium 3.7 3.5 - 5.1 mmol/L   Chloride 107 98 - 111 mmol/L   CO2 24 22 - 32 mmol/L   Glucose, Bld 171 (H) 70 - 99 mg/dL   BUN 9 6 - 20 mg/dL   Creatinine, Ser 0.68 0.44 - 1.00 mg/dL   Calcium 8.9 8.9 - 10.3 mg/dL   Total Protein 6.8 6.5 - 8.1 g/dL   Albumin 3.8 3.5 - 5.0 g/dL   AST 14 (L) 15 - 41 U/L   ALT 16 0 - 44 U/L   Alkaline Phosphatase 63 38 - 126 U/L   Total Bilirubin 0.4 0.3 - 1.2 mg/dL   GFR, Estimated >60 >60 mL/min   Anion gap 5 5 - 15  CBC  Result Value Ref Range   WBC 5.5 4.0 - 10.5 K/uL   RBC 4.58 3.87 - 5.11 MIL/uL   Hemoglobin 11.5 (L) 12.0 - 15.0 g/dL   HCT 36.5 36.0 - 46.0 %   MCV 79.7 (L) 80.0 - 100.0 fL   MCH 25.1 (L) 26.0 - 34.0 pg   MCHC 31.5 30.0 - 36.0 g/dL   RDW 13.8 11.5 - 15.5 %   Platelets 276 150 - 400 K/uL   nRBC 0.0 0.0 - 0.2 %  Urinalysis, Routine w reflex microscopic  Result Value Ref Range   Color, Urine STRAW (A) YELLOW   APPearance CLEAR CLEAR   Specific Gravity, Urine 1.032 (H) 1.005 - 1.030   pH 7.0 5.0 - 8.0   Glucose, UA NEGATIVE NEGATIVE mg/dL   Hgb urine dipstick NEGATIVE NEGATIVE   Bilirubin Urine NEGATIVE NEGATIVE   Ketones, ur NEGATIVE NEGATIVE mg/dL   Protein, ur NEGATIVE NEGATIVE mg/dL   Nitrite NEGATIVE NEGATIVE   Leukocytes,Ua NEGATIVE NEGATIVE  I-Stat beta hCG blood, ED  Result Value Ref Range   I-stat hCG, quantitative <5.0 <5  mIU/mL   Comment 3           CT ABDOMEN PELVIS W CONTRAST  Result Date: 11/28/2021 CLINICAL DATA:  Abdominal pain, acute. History of gastric bypass and hernia surgery. Diverticulitis. History of colon resection. EXAM: CT ABDOMEN AND PELVIS WITH CONTRAST TECHNIQUE: Multidetector CT imaging of the abdomen and pelvis was performed using the standard protocol following bolus administration of intravenous contrast. RADIATION DOSE REDUCTION: This exam was performed according to the departmental dose-optimization program which includes automated exposure control, adjustment of the mA and/or kV according to patient size and/or use of iterative reconstruction technique. CONTRAST:  157m OMNIPAQUE IOHEXOL 300 MG/ML  SOLN COMPARISON:  CT examination dated September 27, 2021 FINDINGS: Lower chest: No acute abnormality. Hepatobiliary: No focal liver abnormality is seen. No gallstones, gallbladder wall thickening, or biliary dilatation. Pancreas: Unremarkable. No pancreatic ductal dilatation or surrounding inflammatory changes. Spleen: Normal in size without focal abnormality. Adrenals/Urinary Tract: Adrenal glands are unremarkable. Exophytic simple renal cyst in the upper pole of the right kidney measuring up to 2.4 cm. No renal calculi, focal lesion, or hydronephrosis. Bladder is unremarkable. Stomach/Bowel: Postsurgical changes for prior gastric bypass surgery. Bowel loops are normal in caliber. Normal appendix. Postsurgical changes for prior colonic resection. No evidence of bowel obstruction, colitis or diverticulitis. Vascular/Lymphatic: No significant  vascular findings are present. No enlarged abdominal or pelvic lymph nodes. Reproductive: Uterus and bilateral adnexa are unremarkable. Other: No abdominal wall hernia or abnormality. No abdominopelvic ascites. Musculoskeletal: Status post left hip arthroplasty. No acute osseous abnormality. IMPRESSION: 1. No CT evidence of acute abdominal/pelvic process. 2. Postsurgical  changes for prior gastric bypass surgery and colonic resection. No evidence of bowel obstruction, colitis or diverticulitis. 3. Status post left hip arthroplasty. Electronically Signed   By: Keane Police D.O.   On: 11/28/2021 23:10   DG Chest 2 View  Result Date: 11/28/2021 CLINICAL DATA:  Chest pain EXAM: CHEST - 2 VIEW COMPARISON:  08/03/2020 FINDINGS: The heart size and mediastinal contours are within normal limits. Both lungs are clear. The visualized skeletal structures are unremarkable. IMPRESSION: Normal study. Electronically Signed   By: Rolm Baptise M.D.   On: 11/28/2021 21:41    Radiology CT ABDOMEN PELVIS W CONTRAST  Result Date: 11/28/2021 CLINICAL DATA:  Abdominal pain, acute. History of gastric bypass and hernia surgery. Diverticulitis. History of colon resection. EXAM: CT ABDOMEN AND PELVIS WITH CONTRAST TECHNIQUE: Multidetector CT imaging of the abdomen and pelvis was performed using the standard protocol following bolus administration of intravenous contrast. RADIATION DOSE REDUCTION: This exam was performed according to the departmental dose-optimization program which includes automated exposure control, adjustment of the mA and/or kV according to patient size and/or use of iterative reconstruction technique. CONTRAST:  153m OMNIPAQUE IOHEXOL 300 MG/ML  SOLN COMPARISON:  CT examination dated September 27, 2021 FINDINGS: Lower chest: No acute abnormality. Hepatobiliary: No focal liver abnormality is seen. No gallstones, gallbladder wall thickening, or biliary dilatation. Pancreas: Unremarkable. No pancreatic ductal dilatation or surrounding inflammatory changes. Spleen: Normal in size without focal abnormality. Adrenals/Urinary Tract: Adrenal glands are unremarkable. Exophytic simple renal cyst in the upper pole of the right kidney measuring up to 2.4 cm. No renal calculi, focal lesion, or hydronephrosis. Bladder is unremarkable. Stomach/Bowel: Postsurgical changes for prior gastric bypass  surgery. Bowel loops are normal in caliber. Normal appendix. Postsurgical changes for prior colonic resection. No evidence of bowel obstruction, colitis or diverticulitis. Vascular/Lymphatic: No significant vascular findings are present. No enlarged abdominal or pelvic lymph nodes. Reproductive: Uterus and bilateral adnexa are unremarkable. Other: No abdominal wall hernia or abnormality. No abdominopelvic ascites. Musculoskeletal: Status post left hip arthroplasty. No acute osseous abnormality. IMPRESSION: 1. No CT evidence of acute abdominal/pelvic process. 2. Postsurgical changes for prior gastric bypass surgery and colonic resection. No evidence of bowel obstruction, colitis or diverticulitis. 3. Status post left hip arthroplasty. Electronically Signed   By: IKeane PoliceD.O.   On: 11/28/2021 23:10   DG Chest 2 View  Result Date: 11/28/2021 CLINICAL DATA:  Chest pain EXAM: CHEST - 2 VIEW COMPARISON:  08/03/2020 FINDINGS: The heart size and mediastinal contours are within normal limits. Both lungs are clear. The visualized skeletal structures are unremarkable. IMPRESSION: Normal study. Electronically Signed   By: KRolm BaptiseM.D.   On: 11/28/2021 21:41    Procedures Procedures    Medications Ordered in ED Medications  iohexol (OMNIPAQUE) 300 MG/ML solution 100 mL (100 mLs Intravenous Contrast Given 11/28/21 2253)  alum & mag hydroxide-simeth (MAALOX/MYLANTA) 200-200-20 MG/5ML suspension 30 mL (30 mLs Oral Given 11/29/21 0038)  ondansetron (ZOFRAN-ODT) disintegrating tablet 8 mg (8 mg Oral Given 11/29/21 0039)  dicyclomine (BENTYL) injection 20 mg (20 mg Intramuscular Given 11/29/21 0039)    ED Course/ Medical Decision Making/ A&P  Medical Decision Making Patient with gastric bypass in 2016 with pain   Amount and/or Complexity of Data Reviewed External Data Reviewed: notes.    Details: Previous notes reviewed  Labs: ordered.    Details: All labs reviewed: preg  is negative. Lipase normal 33.  Normal sodium 136, potassium 3.7, normal creatinine .68 normal LFTs. Normal white count 5.5, low hemoglobin 11.5, normal platelet count. Normal urine  Radiology: ordered and independent interpretation performed.    Details: Normal acute on CT by me   Risk OTC drugs. Prescription drug management.    Final Clinical Impression(s) / ED Diagnoses Final diagnoses:  None  Return for intractable cough, coughing up blood, fevers > 100.4 unrelieved by medication, shortness of breath, intractable vomiting, chest pain, shortness of breath, weakness, numbness, changes in speech, facial asymmetry, abdominal pain, passing out, Inability to tolerate liquids or food, cough, altered mental status or any concerns. No signs of systemic illness or infection. The patient is nontoxic-appearing on exam and vital signs are within normal limits.  I have reviewed the triage vital signs and the nursing notes. Pertinent labs & imaging results that were available during my care of the patient were reviewed by me and considered in my medical decision making (see chart for details). After history, exam, and medical workup I feel the patient has been appropriately medically screened and is safe for discharge home. Pertinent diagnoses were discussed with the patient. Patient was given return precautions.  Rx / DC Orders ED Discharge Orders          Ordered    dicyclomine (BENTYL) 20 MG tablet  2 times daily        11/29/21 0158              Johnathan Tortorelli, MD 11/29/21 0222

## 2021-11-30 ENCOUNTER — Other Ambulatory Visit: Payer: Self-pay | Admitting: Family Medicine

## 2021-11-30 ENCOUNTER — Telehealth: Payer: Self-pay | Admitting: Family Medicine

## 2021-11-30 NOTE — Telephone Encounter (Signed)
Caller is Lennette Bihari, Software engineer from [employer] states: -He is in need of completing extra paper work needed by [employer] for patient's medications. Questions are as needed below.    - Diagnosis codes for pain medications  - How long patient has been a patient with office   - Last date of service   - Has there ever been a drug test performed? If so, when?  - Has patient trialed any other therapy prior to medication   - How long do you expect to treat patient   Lennette Bihari will need these response verbally disclosed. He can be reached at [phone].

## 2021-12-01 NOTE — Telephone Encounter (Signed)
[place] and spoke to [place] told her if this is for the Tramadol that Dr. Jerline Pain prescribes, Guido Sander said she is not sure which medication and Lennette Bihari is not here but pt is also on Ambien and Clonazepam. Told her yes, I can answer questions for pain med Dx is Polyarthralgia and Chronic low back pain, pt has been with our practice since 2020, last seen 09/13/2021 in office, no drug screen done, she has tried Celebrex, Meloxicam and Relafen and expect to treat indefinite at this time. Told her if you need anything else please let us know. Amani verbalized understanding.

## 2021-12-12 ENCOUNTER — Emergency Department (HOSPITAL_COMMUNITY)
Admission: EM | Admit: 2021-12-12 | Discharge: 2021-12-13 | Discharge status: Against Medical Advice | Payer: Self-pay | Attending: Emergency Medicine | Admitting: Emergency Medicine

## 2021-12-12 ENCOUNTER — Encounter (HOSPITAL_COMMUNITY): Payer: Self-pay

## 2021-12-12 ENCOUNTER — Other Ambulatory Visit: Payer: Self-pay

## 2021-12-12 ENCOUNTER — Emergency Department (HOSPITAL_COMMUNITY): Payer: Self-pay

## 2021-12-12 DIAGNOSIS — R1010 Upper abdominal pain, unspecified: Secondary | ICD-10-CM | POA: Insufficient documentation

## 2021-12-12 DIAGNOSIS — R079 Chest pain, unspecified: Secondary | ICD-10-CM | POA: Insufficient documentation

## 2021-12-12 DIAGNOSIS — R110 Nausea: Secondary | ICD-10-CM | POA: Insufficient documentation

## 2021-12-12 DIAGNOSIS — Z5321 Procedure and treatment not carried out due to patient leaving prior to being seen by health care provider: Secondary | ICD-10-CM | POA: Insufficient documentation

## 2021-12-12 DIAGNOSIS — R0789 Other chest pain: Secondary | ICD-10-CM | POA: Diagnosis not present

## 2021-12-12 DIAGNOSIS — E119 Type 2 diabetes mellitus without complications: Secondary | ICD-10-CM | POA: Insufficient documentation

## 2021-12-12 DIAGNOSIS — R509 Fever, unspecified: Secondary | ICD-10-CM | POA: Insufficient documentation

## 2021-12-12 LAB — BASIC METABOLIC PANEL
Anion gap: 8 (ref 5–15)
BUN: 8 mg/dL (ref 6–20)
CO2: 20 mmol/L — ABNORMAL LOW (ref 22–32)
Calcium: 9.1 mg/dL (ref 8.9–10.3)
Chloride: 109 mmol/L (ref 98–111)
Creatinine, Ser: 0.61 mg/dL (ref 0.44–1.00)
GFR, Estimated: >60 mL/min (ref >60)
Glucose, Bld: 110 mg/dL — ABNORMAL HIGH (ref 70–99)
Potassium: 3.8 mmol/L (ref 3.5–5.1)
Sodium: 137 mmol/L (ref 135–145)

## 2021-12-12 LAB — URINALYSIS, ROUTINE W REFLEX MICROSCOPIC
Bilirubin Urine: NEGATIVE
Glucose, UA: NEGATIVE mg/dL
Hgb urine dipstick: NEGATIVE
Ketones, ur: NEGATIVE mg/dL
Leukocytes,Ua: NEGATIVE
Nitrite: NEGATIVE
Protein, ur: NEGATIVE mg/dL
Specific Gravity, Urine: 1.006 (ref 1.005–1.030)
pH: 6 (ref 5.0–8.0)

## 2021-12-12 LAB — CBC
HCT: 34.9 % — ABNORMAL LOW (ref 36.0–46.0)
Hemoglobin: 11 g/dL — ABNORMAL LOW (ref 12.0–15.0)
MCH: 25.1 pg — ABNORMAL LOW (ref 26.0–34.0)
MCHC: 31.5 g/dL (ref 30.0–36.0)
MCV: 79.7 fL — ABNORMAL LOW (ref 80.0–100.0)
Platelets: 259 10*3/uL (ref 150–400)
RBC: 4.38 MIL/uL (ref 3.87–5.11)
RDW: 14.2 % (ref 11.5–15.5)
WBC: 6.4 10*3/uL (ref 4.0–10.5)
nRBC: 0 % (ref 0.0–0.2)

## 2021-12-12 LAB — TROPONIN I (HIGH SENSITIVITY): Troponin I (High Sensitivity): <2 ng/L (ref <18)

## 2021-12-12 LAB — PREGNANCY, URINE: Preg Test, Ur: NEGATIVE

## 2021-12-12 NOTE — ED Triage Notes (Addendum)
Pt states that she has been having sharp center chest pain since today. Pt reports having pressure behind her left shoulder.

## 2021-12-12 NOTE — ED Provider Triage Note (Signed)
Emergency Medicine Provider Triage Evaluation Note  Heidi Holland , a 46 y.o. female  was evaluated in triage.  History of diabetes, gastric bypass Pt complains of chest pain and upper abdominal pain with bloating. Chest pain onset this morning, radiates to left shoulder. Recently seen for similar complaints. Subjective fever. Nausea without vomiting. Normal bowel movements (formed).  Review of Systems  Positive: Abdominal pain, chest pain Negative: Shortness of breath, vomiting, diarrhea  Physical Exam  BP 122/84   Pulse 80   Temp 98.4 F (36.9 C) (Oral)   Resp 16   Ht 5\' 3"  (1.6 m)   Wt 86.2 kg   LMP 11/20/2021   SpO2 98%   BMI 33.66 kg/m  Gen:   Awake, no distress   Resp:  Normal effort  MSK:   Moves extremities without difficulty  Other:  Abdomen distended  Medical Decision Making  Medically screening exam initiated at 8:01 PM.  Appropriate orders placed.  Weronika Birch was informed that the remainder of the evaluation will be completed by another provider, this initial triage assessment does not replace that evaluation, and the importance of remaining in the ED until their evaluation is complete.     Etta Quill, NP 12/12/21 2019

## 2021-12-14 ENCOUNTER — Other Ambulatory Visit: Payer: Self-pay | Admitting: Family Medicine

## 2021-12-18 ENCOUNTER — Ambulatory Visit (INDEPENDENT_AMBULATORY_CARE_PROVIDER_SITE_OTHER): Payer: Self-pay | Admitting: Family Medicine

## 2021-12-18 VITALS — BP 124/81 | HR 79 | Temp 97.5°F | Ht 63.0 in | Wt 194.8 lb

## 2021-12-18 DIAGNOSIS — K219 Gastro-esophageal reflux disease without esophagitis: Secondary | ICD-10-CM

## 2021-12-18 DIAGNOSIS — R1013 Epigastric pain: Secondary | ICD-10-CM

## 2021-12-18 DIAGNOSIS — Z9884 Bariatric surgery status: Secondary | ICD-10-CM

## 2021-12-18 LAB — H. PYLORI ANTIBODY, IGG: H Pylori IgG: NEGATIVE

## 2021-12-18 MED ORDER — PANTOPRAZOLE SODIUM 40 MG PO TBEC: DELAYED_RELEASE_TABLET | ORAL | 0 refills | Status: DC

## 2021-12-18 MED ORDER — DICYCLOMINE HCL 20 MG PO TABS: 20.0000 mg | ORAL_TABLET | Freq: Two times a day (BID) | ORAL | 3 refills | Status: DC

## 2021-12-18 NOTE — Patient Instructions (Signed)
It was very nice to see you today!  I am concerned she may have a gallstone.  We will check to make sure that you do not have H. pylori.  I will refill your Protonix and dicyclomine.  Please let us know if not improving in the next couple of weeks.  Take care, Dr Jerline Pain  PLEASE NOTE:  If you had any lab tests please let us know if you have not heard back within a few days. You may see your results on mychart before we have a chance to review them but we will give you a call once they are reviewed by [place]. If we ordered any referrals today, please let us know if you have not heard from their office within the next week.   Please try these tips to maintain a healthy lifestyle:  Eat at least 3 REAL meals and 1-2 snacks per day.  Aim for no more than 5 hours between eating.  If you eat breakfast, please do so within one hour of getting up.   Each meal should contain half fruits/vegetables, one quarter protein, and one quarter carbs (no bigger than a computer mouse)  Cut down on sweet beverages. This includes juice, soda, and sweet tea.   Drink at least 1 glass of water with each meal and aim for at least 8 glasses per day  Exercise at least 150 minutes every week.

## 2021-12-18 NOTE — Progress Notes (Signed)
   Heidi Holland is a 46 y.o. female who presents today for an office visit.  Assessment/Plan:  New/Acute Problems: Abdominal Pain Concern for biliary colic based on her symptoms.  Discussed with patient that CT scan does not always pick this up and that ultrasound would be the next step.  She is self-pay and will have to check with pricing before she can proceed with this.  She is worried about H. pylori.  We will check an antibody level today and empirically treat if positive.  She is status post gastric bypass and had taken NSAIDs a few weeks ago-doubt this could potentially be peptic ulcer.  We will restart her Protonix and advised her to avoid NSAIDs going forward.  We discussed reasons to return to care.  Chronic Problems Addressed Today: No problem-specific Assessment & Plan notes found for this encounter.     Subjective:  HPI:  Patient here for ED follow-up.  She has been twice over the last month with abdominal pain.  Pain located in her upper stomach.  Has a lot of associated bloating and nausea as well.  Had a CT scan which was reassuring and she was discharged home. She was given an rx for bentyl which has helped. She has had a lot of bloating and foul smelling gas production. Nausea gets better after eating but then she gets more bloated afterwards.  She is worried about H. Pylori.  She does have a history of gastric bypass.  She has been taking more ibuprofen has not taken any for the last few weeks.  She is also been on Protonix for last several weeks.      Objective:  Physical Exam: BP 124/81   Pulse 79   Temp (!) 97.5 F (36.4 C) (Temporal)   Ht 5\' 3"  (1.6 m)   Wt 194 lb 12.8 oz (88.4 kg)   LMP 11/20/2021   SpO2 99%   BMI 34.51 kg/m   Gen: No acute distress, resting comfortably CV: Regular rate and rhythm with no murmurs appreciated Pulm: Normal work of breathing, clear to auscultation bilaterally with no crackles, wheezes, or rhonchi GI: Bowel sounds present,  tenderness to palpation in epigastric area.  No rebound or guarding.   Neuro: Grossly normal, moves all extremities Psych: Normal affect and thought content  Time Spent: 30 minutes of total time was spent on the date of the encounter performing the following actions: chart review prior to seeing the patient including recent Ed visit, obtaining history, performing a medically necessary exam, counseling on the treatment plan, placing orders, and documenting in our EHR.        Algis Greenhouse. Jerline Pain, MD 12/18/2021 11:44 AM

## 2021-12-20 NOTE — Progress Notes (Signed)
Please inform patient of the following:  Her H. pylori test is negative.  Would like for her to take the Protonix for 1 to 2 weeks and let us know if her symptoms or not improving.  Do not need to start any antibiotics at this time since her H. pylori test was negative.  If her symptoms are not improving we will need to get an ultrasound of her gallbladder as we discussed at her office visit.

## 2021-12-24 ENCOUNTER — Emergency Department (HOSPITAL_COMMUNITY): Payer: Self-pay

## 2021-12-24 ENCOUNTER — Other Ambulatory Visit: Payer: Self-pay

## 2021-12-24 ENCOUNTER — Encounter (HOSPITAL_COMMUNITY): Payer: Self-pay

## 2021-12-24 ENCOUNTER — Emergency Department (HOSPITAL_COMMUNITY)
Admission: EM | Admit: 2021-12-24 | Discharge: 2021-12-25 | Disposition: A | Discharge status: Home / Self Care | Payer: Self-pay | Attending: Emergency Medicine | Admitting: Emergency Medicine

## 2021-12-24 DIAGNOSIS — N9489 Other specified conditions associated with female genital organs and menstrual cycle: Secondary | ICD-10-CM | POA: Insufficient documentation

## 2021-12-24 DIAGNOSIS — R1013 Epigastric pain: Secondary | ICD-10-CM

## 2021-12-24 DIAGNOSIS — N3000 Acute cystitis without hematuria: Secondary | ICD-10-CM | POA: Insufficient documentation

## 2021-12-24 LAB — COMPREHENSIVE METABOLIC PANEL
ALT: 15 U/L (ref 0–44)
AST: 15 U/L (ref 15–41)
Albumin: 4 g/dL (ref 3.5–5.0)
Alkaline Phosphatase: 59 U/L (ref 38–126)
Anion gap: 7 (ref 5–15)
BUN: 8 mg/dL (ref 6–20)
CO2: 24 mmol/L (ref 22–32)
Calcium: 8.9 mg/dL (ref 8.9–10.3)
Chloride: 105 mmol/L (ref 98–111)
Creatinine, Ser: 0.72 mg/dL (ref 0.44–1.00)
GFR, Estimated: >60 mL/min (ref >60)
Glucose, Bld: 135 mg/dL — ABNORMAL HIGH (ref 70–99)
Potassium: 3.9 mmol/L (ref 3.5–5.1)
Sodium: 136 mmol/L (ref 135–145)
Total Bilirubin: 0.4 mg/dL (ref 0.3–1.2)
Total Protein: 7.4 g/dL (ref 6.5–8.1)

## 2021-12-24 LAB — CBC WITH DIFFERENTIAL/PLATELET
Abs Immature Granulocytes: 0.03 10*3/uL (ref 0.00–0.07)
Basophils Absolute: 0.1 10*3/uL (ref 0.0–0.1)
Basophils Relative: 1 %
Eosinophils Absolute: 0 10*3/uL (ref 0.0–0.5)
Eosinophils Relative: 0 %
HCT: 38.3 % (ref 36.0–46.0)
Hemoglobin: 12 g/dL (ref 12.0–15.0)
Immature Granulocytes: 0 %
Lymphocytes Relative: 36 %
Lymphs Abs: 2.7 10*3/uL (ref 0.7–4.0)
MCH: 24.8 pg — ABNORMAL LOW (ref 26.0–34.0)
MCHC: 31.3 g/dL (ref 30.0–36.0)
MCV: 79.3 fL — ABNORMAL LOW (ref 80.0–100.0)
Monocytes Absolute: 0.7 10*3/uL (ref 0.1–1.0)
Monocytes Relative: 9 %
Neutro Abs: 4.1 10*3/uL (ref 1.7–7.7)
Neutrophils Relative %: 54 %
Platelets: 280 10*3/uL (ref 150–400)
RBC: 4.83 MIL/uL (ref 3.87–5.11)
RDW: 14.6 % (ref 11.5–15.5)
WBC: 7.6 10*3/uL (ref 4.0–10.5)
nRBC: 0 % (ref 0.0–0.2)

## 2021-12-24 LAB — URINALYSIS, ROUTINE W REFLEX MICROSCOPIC
Bilirubin Urine: NEGATIVE
Glucose, UA: NEGATIVE mg/dL
Ketones, ur: NEGATIVE mg/dL
Nitrite: NEGATIVE
Protein, ur: 30 mg/dL — AB
Specific Gravity, Urine: 1.002 — ABNORMAL LOW (ref 1.005–1.030)
pH: 6 (ref 5.0–8.0)

## 2021-12-24 LAB — I-STAT BETA HCG BLOOD, ED (MC, WL, AP ONLY): I-stat hCG, quantitative: <5 m[IU]/mL (ref <5)

## 2021-12-24 LAB — LIPASE, BLOOD: Lipase: 35 U/L (ref 11–51)

## 2021-12-24 NOTE — ED Triage Notes (Signed)
Pt to er, pt states that she had gastric bypass back in 2016, states that she has been here three times this past month, states that she has been to see her pmd and was going to rule out gallstones but can't afford the ultrasound.  Pt states that nothing seems to make her pain better or worse, pt has a [employer] drink with her.

## 2021-12-24 NOTE — ED Provider Triage Note (Signed)
Emergency Medicine Provider Triage Evaluation Note  Heidi Holland , a 46 y.o. female  was evaluated in triage.  Pt complains of RUQ abdominal pain. States that same has been ongoing for the past month. Endorses associated abdominal distention and nausea. Has had several abdominal Cts recently for same without acute finidngs. Saw pcp who wanted her to have an [place] but she is unable to afford this outpatient due to being uninsured. Endorses intermittent fevers with Tmax 101, but none today. Hx gastric bypass in 2016  Review of Systems  Positive:  Negative:   Physical Exam  BP 138/87 (BP Location: Right Arm)   Pulse 86   Temp 98.2 F (36.8 C) (Oral)   Resp 16   Ht 5\' 3"  (1.6 m)   Wt 88 kg   LMP 11/20/2021   SpO2 99%   BMI 34.37 kg/m  Gen:   Awake, no distress   Resp:  Normal effort  MSK:   Moves extremities without difficulty  Other:  RUQ tenderness  Medical Decision Making  Medically screening exam initiated at 6:52 PM.  Appropriate orders placed.  Heidi Holland was informed that the remainder of the evaluation will be completed by another provider, this initial triage assessment does not replace that evaluation, and the importance of remaining in the ED until their evaluation is complete.     Shanae, Luo 12/24/21 13/12/23

## 2021-12-25 MED ORDER — CEPHALEXIN 500 MG PO CAPS: 500.0000 mg | ORAL_CAPSULE | Freq: Two times a day (BID) | ORAL | 0 refills | Status: DC

## 2021-12-25 MED ORDER — ALUM & MAG HYDROXIDE-SIMETH 200-200-20 MG/5ML PO SUSP
30.0000 mL | Freq: Once | ORAL | Status: AC
  Administered 2021-12-25: 30 mL via ORAL
  Filled 2021-12-25: qty 30

## 2021-12-25 MED ORDER — SUCRALFATE 1 G PO TABS: 1.0000 g | ORAL_TABLET | Freq: Three times a day (TID) | ORAL | 1 refills | Status: DC

## 2021-12-25 MED ORDER — SUCRALFATE 1 G PO TABS
1.0000 g | ORAL_TABLET | Freq: Once | ORAL | Status: AC
  Administered 2021-12-25: 1 g via ORAL
  Filled 2021-12-25: qty 1

## 2021-12-25 MED ORDER — ACETAMINOPHEN 500 MG PO TABS
1000.0000 mg | ORAL_TABLET | Freq: Once | ORAL | Status: AC
  Administered 2021-12-25: 1000 mg via ORAL
  Filled 2021-12-25: qty 2

## 2021-12-25 MED ORDER — OMEPRAZOLE 20 MG PO CPDR: 20.0000 mg | DELAYED_RELEASE_CAPSULE | Freq: Every day | ORAL | 1 refills | Status: DC

## 2021-12-25 MED ORDER — FAMOTIDINE 20 MG PO TABS
20.0000 mg | ORAL_TABLET | Freq: Once | ORAL | Status: AC
  Administered 2021-12-25: 20 mg via ORAL
  Filled 2021-12-25: qty 1

## 2021-12-25 MED ORDER — FLUCONAZOLE 150 MG PO TABS: 150.0000 mg | ORAL_TABLET | Freq: Every day | ORAL | 0 refills | Status: DC

## 2021-12-25 NOTE — Discharge Instructions (Addendum)
The ultrasound of your gallbladder was normal.  Please take medications as prescribed.  If you have change or worsening in symptoms, follow-up with your doctor or return to the emergency department.  You might need to consider being seen by a gastroenterologist.  I have listed contact information above.

## 2021-12-25 NOTE — ED Provider Notes (Signed)
[facility] DE[facility] Emergency Department Provider Note MRN:  [id number]  Arrival date & time: 12/25/21     Chief Complaint   Abdominal Pain   History of Present Illness   Heidi Holland is a 46 y.o. year-old female presents to the ED with chief complaint of epic gastric abdominal pain.  She states that she has had these symptoms intermittently for the past several weeks to months.  She does have history of gastric bypass.  Has had several fairly recent reassuring work-ups with CT imaging and ultrasound.  She states that the pain is a sharp stabbing and burning sensation.  She states that her diet has not been very good lately.  She reports some new urinary frequency and subjective fevers, but denies dysuria.  History provided by patient.   Review of Systems  Pertinent positive and negative review of systems noted in HPI.    Physical Exam   Vitals:   12/24/21 1833  BP: 138/87  Pulse: 86  Resp: 16  Temp: 98.2 F (36.8 C)  SpO2: 99%    CONSTITUTIONAL:  well-appearing, NAD NEURO:  Alert and oriented x 3, CN 3-12 grossly intact EYES:  eyes equal and reactive ENT/NECK:  Supple, no stridor  CARDIO:  normal rate, regular rhythm, appears well-perfused  PULM:  No respiratory distress, CTAB GI/GU:  non-distended, non-tender, normal bowel sounds throughout MSK/SPINE:  No gross deformities, no edema, moves all extremities  SKIN:  no rash, atraumatic   *Additional and/or pertinent findings included in MDM below  Diagnostic and Interventional Summary    EKG Interpretation  Date/Time:    Ventricular Rate:    PR Interval:    QRS Duration:   QT Interval:    QTC Calculation:   R Axis:     Text Interpretation:         Labs Reviewed  COMPREHENSIVE METABOLIC PANEL - Abnormal; Notable for the following components:      Result Value   Glucose, Bld 135 (*)    All other components within normal limits  CBC WITH DIFFERENTIAL/PLATELET - Abnormal; Notable for the  following components:   MCV 79.3 (*)    MCH 24.8 (*)    All other components within normal limits  URINALYSIS, ROUTINE W REFLEX MICROSCOPIC - Abnormal; Notable for the following components:   Color, Urine RED (*)    APPearance HAZY (*)    Specific Gravity, Urine 1.002 (*)    Hgb urine dipstick MODERATE (*)    Protein, ur 30 (*)    Leukocytes,Ua SMALL (*)    Bacteria, UA MANY (*)    All other components within normal limits  LIPASE, BLOOD  I-STAT BETA HCG BLOOD, ED ([facility], [facility], [facility] ONLY)    US Abdomen Limited RUQ (LIVER/GB)  Final Result      Medications  alum & mag hydroxide-simeth (MAALOX/MYLANTA) 200-200-20 MG/5ML suspension 30 mL (has no administration in time range)  sucralfate (CARAFATE) tablet 1 g (has no administration in time range)  famotidine (PEPCID) tablet 20 mg (has no administration in time range)     Procedures  /  Critical Care Procedures  ED Course and Medical Decision Making  I have reviewed the triage vital signs, the nursing notes, and pertinent available records from the EMR.  Social Determinants Affecting Complexity of Care: Patient has no clinically significant social determinants affecting this chief complaint. has decreased access to medical care.   ED Course:    Medical Decision Making Patient here with epigastric abdominal pain and  nausea.  She has had symptoms for the past several weeks to months.  She has had recent CT scans which have been reassuring.  She states that she has had some mild intermittent fevers and new urinary frequency.  History of gastric bypass in 2016.  Ultrasound ordered in triage is negative for gallstones or signs of cholecystitis.  The way that she describes her symptoms with epigastric burning sensation, seems more consistent with GERD or peptic ulcer disease.  She does not appear toxic.  No evidence of acute abdomen.  No think that she requires any further emergent work-up.  We will trial prescription of Carafate and  omeprazole.  We will also treat urinary frequency for UTI given subjective fevers with Keflex.  Patient states that she gets yeast infections, so we will give Diflucan.  Amount and/or Complexity of Data Reviewed Labs: ordered.    Details: My interpretation of the lab results are: urinalysis concerning for infection, pregnancy test negative, no leukocytosis, no significant electrolyte derangement Radiology: independent interpretation performed.    Details: No gallstones seen  Risk OTC drugs. Prescription drug management. Decision regarding hospitalization.     Consultants: No consultations were needed in caring for this patient.   Treatment and Plan: I considered admission due to patient's initial presentation, but after considering the examination and diagnostic results, patient will not require admission and can be discharged with outpatient follow-up.    Final Clinical Impressions(s) / ED Diagnoses     ICD-10-CM   1. Epigastric pain  R10.13     2. Acute cystitis without hematuria  N30.00       ED Discharge Orders          Ordered    fluconazole (DIFLUCAN) 150 MG tablet  Daily        12/25/21 0300    cephALEXin (KEFLEX) 500 MG capsule  2 times daily        12/25/21 0300    sucralfate (CARAFATE) 1 g tablet  3 times daily with meals & bedtime        12/25/21 0300    omeprazole (PRILOSEC) 20 MG capsule  Daily        12/25/21 0300              Discharge Instructions Discussed with and Provided to Patient:     Discharge Instructions      The ultrasound of your gallbladder was normal.  Please take medications as prescribed.  If you have change or worsening in symptoms, follow-up with your doctor or return to the emergency department.  You might need to consider being seen by a gastroenterologist.  I have listed contact information above.       Montine Circle, PA-C 12/25/21 0301    Quintella Reichert, MD 12/25/21 [phone]

## 2021-12-25 NOTE — ED Notes (Signed)
Pt called x1 in ED lobby. Pt could not be located within ED lobby, and did not respond to name call.   °

## 2021-12-29 ENCOUNTER — Ambulatory Visit: Payer: BC Managed Care – PPO | Admitting: Neurology

## 2022-01-02 ENCOUNTER — Encounter (HOSPITAL_COMMUNITY): Payer: Self-pay | Admitting: Emergency Medicine

## 2022-01-02 ENCOUNTER — Ambulatory Visit (HOSPITAL_COMMUNITY)
Admission: EM | Admit: 2022-01-02 | Discharge: 2022-01-02 | Disposition: A | Discharge status: Home / Self Care | Payer: Self-pay | Attending: Nurse Practitioner | Admitting: Nurse Practitioner

## 2022-01-02 DIAGNOSIS — J0100 Acute maxillary sinusitis, unspecified: Secondary | ICD-10-CM

## 2022-01-02 MED ORDER — AMOXICILLIN-POT CLAVULANATE 875-125 MG PO TABS: 1.0000 | ORAL_TABLET | Freq: Two times a day (BID) | ORAL | 0 refills | Status: DC

## 2022-01-02 MED ORDER — PREDNISONE 10 MG (21) PO TBPK: ORAL_TABLET | Freq: Every day | ORAL | 0 refills | Status: DC

## 2022-01-02 NOTE — ED Triage Notes (Signed)
Pt c/o sinus pain/pressure for 2-3 weeks. Reports ear painful, right ear worse that started couple days ago.  Pt on antibiotics for UTI and to cover sinus infection, but symptoms havent gotten better.

## 2022-01-02 NOTE — Discharge Instructions (Addendum)
Finish antibiotics  Start prescription steroids in the morning  Stop afrin  Astelin at night  Flonase in the morning  Continue sudafed  Saline nasal rinses as needed  Humidifier at night  Change toothbrush  Drink plenty of fluids    I have also sent in a 7-day course of Augmentin to your pharmacy. I would like for you to wait at least 3 days before getting it filled as I do not believe that your sinus issues are due to a bacterial infection.  If you are not seeing any improvement in symptoms then go ahead and start the new course of antibiotics.

## 2022-01-02 NOTE — ED Provider Notes (Signed)
[place]    CSN: [id number] Arrival date & time: 01/02/22  1948      History   Chief Complaint Chief Complaint  Patient presents with   Facial Pain    HPI Heidi Holland is a 46 y.o. female.   Subjective:   Heidi Holland is a 46 y.o. female who presents for evaluation of possible sinus infection. Symptoms include bilateral ear pressure/pain, achiness, cough described as productive of yellow sputum, facial pain, headache, low grade fever, chills, and sinus pressure. She denies any fever, night sweats or weight loss. Onset of symptoms was 3 weeks ago and has been unchanged since that time.  She is drinking plenty of fluids.  She has been on Keflex since 12/24/2021 for UTI.  She asked the prescribing provider to give her medication that would also cover her sinuses.  UTI symptoms are better but sinus symptoms are unchanged.  She is also taken Sudafed, Astelin nasal spray and Afrin for her symptoms.  Patient reports history of chronic sinus issues but no chronic lung issues.  Patient is a non-smoker.    The following portions of the patient's history were reviewed and updated as appropriate: allergies, current medications, past family history, past medical history, past social history, past surgical history, and problem list.             Past Medical History:  Diagnosis Date   Allergy    Anxiety    Asthma    Depression    Diabetes mellitus without complication ([place])    Diverticulitis    GERD (gastroesophageal reflux disease)    HPV in female    Hyperlipidemia    Migraines     Patient Active Problem List   Diagnosis Date Noted   Paresthesia 09/13/2021   CIN I (cervical intraepithelial neoplasia I) 03/03/2021   LSIL pap smear of cervix/human papillomavirus (HPV) positive 10/24/2020   Family history of early CAD 10/24/2020   B12 deficiency 07/26/2020   Somatic dysfunction of spine, cervical 07/21/2020   Status post left hip replacement 03/12/2019   T2DM  (type 2 diabetes mellitus) ([place]) 07/31/2018   Dyslipidemia associated with type 2 diabetes mellitus ([place]) 07/31/2018   Allergic rhinitis 07/31/2018   Mild intermittent asthma, uncomplicated 00/93/8182   GERD (gastroesophageal reflux disease) 07/31/2018   S/P gastric bypass 07/31/2018   Migraine 07/31/2018   Anxiety 07/31/2018   Depression, major, single episode, complete remission ([place]) 07/31/2018   Insomnia 99/37/1696   Umbilical hernia 78/93/8101   Iron deficiency anemia 06/09/2018   Cervicogenic headache 05/12/2018   Low back pain 05/12/2018   Polyarthralgia 05/12/2018    Past Surgical History:  Procedure Laterality Date   BOWEL RESECTION     gasticbypass     GASTRIC BYPASS     KNEE ARTHROSCOPY     TOTAL HIP ARTHROPLASTY Left     OB History     Gravida  0   Para  0   Term  0   Preterm  0   AB  0   Living  0      SAB  0   IAB  0   Ectopic  0   Multiple  0   Live Births  0            Home Medications    Prior to Admission medications   Medication Sig Start Date End Date Taking? Authorizing Provider  amoxicillin-clavulanate (AUGMENTIN) 875-125 MG tablet Take 1 tablet by mouth every 12 (twelve) hours. 01/02/22  Yes Murrill, Samantha, FNP  predniSONE (STERAPRED UNI-PAK 21 TAB) 10 MG (21) TBPK tablet Take by mouth daily. Take 6 tabs by mouth daily  for 2 days, then 5 tabs for 2 days, then 4 tabs for 2 days, then 3 tabs for 2 days, 2 tabs for 2 days, then 1 tab by mouth daily for 2 days 01/02/22  Yes Murrill, Samantha, FNP  albuterol (VENTOLIN HFA) 108 (90 Base) MCG/ACT inhaler INHALE 2 PUFFS BY MOUTH EVERY 6 HOURS AS NEEDED FOR WHEEZE OR SHORTNESS OF BREATH 04/10/21   Parker, Caleb M, MD  azelastine (ASTELIN) 0.1 % nasal spray Place 2 sprays into both nostrils 2 (two) times daily. Use in each nostril as directed 07/26/20   Parker, Caleb M, MD  blood glucose meter kit and supplies KIT Dispense based on patient and insurance preference. Use up to four times  daily as directed. (FOR ICD-9 250.00, 250.01). 07/31/18   Parker, Caleb M, MD  buPROPion (WELLBUTRIN SR) 150 MG 12 hr tablet Take 150 mg by mouth daily.    [provider]  cephALEXin (KEFLEX) 500 MG capsule Take 1 capsule (500 mg total) by mouth 2 (two) times daily. 12/25/21   Browning, Robert, PA-C  clonazePAM (KLONOPIN) 0.5 MG tablet TAKE ONE TABLET BY MOUTH TWICE DAILY AS NEEDED FOR ANXIETY 11/30/21   Parker, Caleb M, MD  Continuous Blood Gluc Receiver (FREESTYLE LIBRE READER) DEVI 1 Device by Does not apply route daily as needed (to check blood sugar). 11/02/21   Parker, Caleb M, MD  Continuous Blood Gluc Sensor (FREESTYLE LIBRE SENSOR SYSTEM) MISC Apply 1 sensor to skin every 14 days. 11/02/21   Parker, Caleb M, MD  dicyclomine (BENTYL) 20 MG tablet Take 1 tablet (20 mg total) by mouth 2 (two) times daily. 12/18/21   Parker, Caleb M, MD  fluconazole (DIFLUCAN) 150 MG tablet Take 1 tablet (150 mg total) by mouth daily. 12/25/21   Browning, Robert, PA-C  lumateperone tosylate (CAPLYTA) 42 MG capsule  01/05/21   [provider]  metFORMIN (GLUCOPHAGE) 1000 MG tablet TAKE 1 TABLET BY MOUTH TWICE A DAY WITH A MEAL 11/02/21   Parker, Caleb M, MD  omeprazole (PRILOSEC) 20 MG capsule Take 1 capsule (20 mg total) by mouth daily. 12/25/21   Browning, Robert, PA-C  ondansetron (ZOFRAN-ODT) 4 MG disintegrating tablet DISSOLVE ONE TABLET ON TOP OF TONGUE EVERY EIGHT HOURS AS NEEDED FOR NAUSEA OR VOMITING 11/30/21   Parker, Caleb M, MD  pantoprazole (PROTONIX) 40 MG tablet TAKE 1 TABLET BY MOUTH EVERY DAY IN THE EVENING 12/18/21   Parker, Caleb M, MD  pregabalin (LYRICA) 150 MG capsule TAKE 1 CAPSULE BY MOUTH TWICE A DAY 08/21/21   Parker, Caleb M, MD  QUEtiapine (SEROQUEL) 100 MG tablet Take by mouth. 05/03/21   [provider]  rosuvastatin (CRESTOR) 20 MG tablet Take 1 tablet (20 mg total) by mouth daily. 11/02/21   Parker, Caleb M, MD  sucralfate (CARAFATE) 1 g tablet Take 1 tablet (1 g  total) by mouth 4 (four) times daily -  with meals and at bedtime. 12/25/21   Browning, Robert, PA-C  tiZANidine (ZANAFLEX) 4 MG tablet TAKE ONE TABLET BY MOUTH THREE TIMES DAILY 12/14/21   Parker, Caleb M, MD  zolpidem (AMBIEN) 5 MG tablet Take 1 tablet (5 mg total) by mouth at bedtime as needed for sleep. 04/18/21   Parker, Caleb M, MD  zonisamide (ZONEGRAN) 100 MG capsule TAKE ONE CAPSULE BY MOUTH ONE TIME DAILY 10/20/21     Pieter Partridge, DO    Family History Family History  Problem Relation Age of Onset   Heart disease Mother    Lung cancer Mother    Rheum arthritis Mother    Arthritis Mother    Cancer Mother    COPD Mother    10 / [place] Mother    Diabetes Father    Heart disease Father    Breast cancer Paternal Aunt     Social History Social History   Tobacco Use   Smoking status: Never   Smokeless tobacco: Never  Vaping Use   Vaping Use: Never used  Substance Use Topics   Alcohol use: Not Currently   Drug use: Yes    Types: Marijuana     Allergies   Ciprofloxacin   Review of Systems Review of Systems  Constitutional:  Positive for fatigue.  HENT:  Positive for congestion, ear pain, sinus pressure and sinus pain. Negative for rhinorrhea and sore throat.   Respiratory:  Positive for cough. Negative for shortness of breath.   Gastrointestinal:  Negative for nausea and vomiting.  Neurological:  Positive for headaches.     Physical Exam Triage Vital Signs ED Triage Vitals  Enc Vitals Group     BP 01/02/22 1956 129/87     Pulse Rate 01/02/22 1956 80     Resp 01/02/22 1956 17     Temp 01/02/22 1956 98.2 F (36.8 C)     Temp Source 01/02/22 1956 Oral     SpO2 01/02/22 1956 97 %     Weight --      Height --      Head Circumference --      Peak Flow --      Pain Score 01/02/22 1955 6     Pain Loc --      Pain Edu? --      Excl. in [place]? --    No data found.  Updated Vital Signs BP 129/87 (BP Location: Left Arm)   Pulse 80   Temp 98.2 F  (36.8 C) (Oral)   Resp 17   LMP 12/24/2021   SpO2 97%   Visual Acuity Right Eye Distance:   Left Eye Distance:   Bilateral Distance:    Right Eye Near:   Left Eye Near:    Bilateral Near:     Physical Exam Vitals reviewed.  Constitutional:      General: She is not in acute distress.    Appearance: Normal appearance. She is not ill-appearing, toxic-appearing or diaphoretic.  HENT:     Head: Normocephalic.     Right Ear: Hearing, tympanic membrane, ear canal and external ear normal.     Left Ear: Hearing, tympanic membrane, ear canal and external ear normal.     Nose: Congestion present.     Right Sinus: Maxillary sinus tenderness present.     Left Sinus: Maxillary sinus tenderness present.     Mouth/Throat:     Lips: Pink.     Mouth: Mucous membranes are moist.     Pharynx: Oropharynx is clear. Uvula midline. No pharyngeal swelling, oropharyngeal exudate or posterior oropharyngeal erythema.  Eyes:     Conjunctiva/sclera: Conjunctivae normal.  Cardiovascular:     Rate and Rhythm: Normal rate.  Pulmonary:     Breath sounds: Normal breath sounds.  Musculoskeletal:        General: Normal range of motion.     Cervical back: Normal range of motion and neck supple.  Lymphadenopathy:  Cervical: No cervical adenopathy.  Skin:    General: Skin is warm and dry.  Neurological:     General: No focal deficit present.     Mental Status: She is alert and oriented to person, place, and time.      UC Treatments / Results  Labs (all labs ordered are listed, but only abnormal results are displayed) Labs Reviewed - No data to display  EKG   Radiology No results found.  Procedures Procedures (including critical care time)  Medications Ordered in UC Medications - No data to display  Initial Impression / Assessment and Plan / UC Course  I have reviewed the triage vital signs and the nursing notes.  Pertinent labs & imaging results that were available during my care of  the patient were reviewed by me and considered in my medical decision making (see chart for details).    46 yo female presenting with a 3 week history of sinus issues that has not improved despite being on keflex since 12/24/21. She has one more day of medications left. Patient is afebrile.  Nontoxic.  Vital signs stable.  Symptoms are likely due to a viral process.  Prednisone Dosepak prescribed.  Advised patient to stop Afrin.  Continue Sudafed and Astelin.  She may also add Flonase to her current regimen.  Patient concerned that she may not get better and would eventually need another course of antibiotics.  She is uninsured and does not want to pay for another office visit.  Inform patient that a 7-day course of Augmentin will be sent to her pharmacy; however, pharmacist has been instructed not to fill the prescription any sooner than 01/06/2022.  Patient may pick up new prescription if her symptoms does not improve within the next 72 hours.  Today's evaluation has revealed no signs of a dangerous process. Discussed diagnosis with patient and/or guardian. Patient and/or guardian aware of their diagnosis, possible red flag symptoms to watch out for and need for close follow up. Patient and/or guardian understands verbal and written discharge instructions. Patient and/or guardian comfortable with plan and disposition.  Patient and/or guardian has a clear mental status at this time, good insight into illness (after discussion and teaching) and has clear judgment to make decisions regarding their care  Documentation was completed with the aid of voice recognition software. Transcription may contain typographical errors. Final Clinical Impressions(s) / UC Diagnoses   Final diagnoses:  Acute maxillary sinusitis, recurrence not specified     Discharge Instructions      Finish antibiotics  Start prescription steroids in the morning  Stop afrin  Astelin at night  Flonase in the morning  Continue  sudafed  Saline nasal rinses as needed  Humidifier at night  Change toothbrush  Drink plenty of fluids    I have also sent in a 7-day course of Augmentin to your pharmacy. I would like for you to wait at least 3 days before getting it filled as I do not believe that your sinus issues are due to a bacterial infection.  If you are not seeing any improvement in symptoms then go ahead and start the new course of antibiotics.     ED Prescriptions     Medication Sig Dispense Auth. Provider   predniSONE (STERAPRED UNI-PAK 21 TAB) 10 MG (21) TBPK tablet Take by mouth daily. Take 6 tabs by mouth daily  for 2 days, then 5 tabs for 2 days, then 4 tabs for 2 days, then 3 tabs for 2   days, 2 tabs for 2 days, then 1 tab by mouth daily for 2 days 42 tablet Murrill, Samantha, FNP   amoxicillin-clavulanate (AUGMENTIN) 875-125 MG tablet Take 1 tablet by mouth every 12 (twelve) hours. 14 tablet Murrill, Samantha, FNP      PDMP not reviewed this encounter.   Murrill, Samantha, FNP 01/02/22 2022  

## 2022-01-03 ENCOUNTER — Other Ambulatory Visit: Payer: Self-pay | Admitting: Family Medicine

## 2022-01-09 ENCOUNTER — Other Ambulatory Visit: Payer: Self-pay | Admitting: Family Medicine

## 2022-02-07 ENCOUNTER — Other Ambulatory Visit: Payer: Self-pay | Admitting: Family Medicine

## 2022-02-07 NOTE — Telephone Encounter (Signed)
  Encourage patient to contact the pharmacy for refills or they can request refills through [facility]  LAST APPOINTMENT DATE:  Please schedule appointment if longer than 1 year  NEXT APPOINTMENT DATE:  MEDICATION: zolpidem (AMBIEN) 5 MG tablet   Is the patient out of medication? Yes  PHARMACY:  [id number] - [place], [place] - [address] Phone: [phone]  Fax: [phone]      Let patient know to contact pharmacy at the end of the day to make sure medication is ready.  Please notify patient to allow 48-72 hours to process

## 2022-02-08 ENCOUNTER — Telehealth: Payer: Self-pay | Admitting: Family Medicine

## 2022-02-08 NOTE — Telephone Encounter (Signed)
Last OV: 12/18/21  Next OV: none scheduled  Last filled: 04/18/21  Quantity: 30 w/ 5 refills

## 2022-02-09 NOTE — Telephone Encounter (Signed)
Can we clarify with patient? This was last refilled by her psychiatrist and further refills should come from them.

## 2022-02-13 NOTE — Telephone Encounter (Signed)
Looks like this was last filled by a psychiatrist. Can we clarify with patient? We should not be duplicating prescriptions.

## 2022-02-14 ENCOUNTER — Other Ambulatory Visit: Payer: Self-pay | Admitting: Family Medicine

## 2022-02-14 NOTE — Telephone Encounter (Signed)
  LAST APPOINTMENT DATE:  12/18/21  NEXT APPOINTMENT DATE: None  MEDICATION:  clonazePAM (KLONOPIN) 0.5 MG tablet   Is the patient out of medication? Yes  PHARMACY: [facility] # [place], [place], [place] [address] Phone: [phone]  Fax: [phone]    Patient is requesting a 30 day supply so it will be covered.

## 2022-02-16 DIAGNOSIS — E782 Mixed hyperlipidemia: Secondary | ICD-10-CM | POA: Diagnosis not present

## 2022-02-16 DIAGNOSIS — F319 Bipolar disorder, unspecified: Secondary | ICD-10-CM | POA: Insufficient documentation

## 2022-02-16 DIAGNOSIS — Z7984 Long term (current) use of oral hypoglycemic drugs: Secondary | ICD-10-CM | POA: Diagnosis not present

## 2022-02-16 DIAGNOSIS — E559 Vitamin D deficiency, unspecified: Secondary | ICD-10-CM | POA: Insufficient documentation

## 2022-02-16 DIAGNOSIS — E1165 Type 2 diabetes mellitus with hyperglycemia: Secondary | ICD-10-CM | POA: Diagnosis not present

## 2022-02-16 DIAGNOSIS — E1159 Type 2 diabetes mellitus with other circulatory complications: Secondary | ICD-10-CM | POA: Diagnosis not present

## 2022-02-16 DIAGNOSIS — Z6835 Body mass index (BMI) 35.0-35.9, adult: Secondary | ICD-10-CM | POA: Diagnosis not present

## 2022-02-16 DIAGNOSIS — I152 Hypertension secondary to endocrine disorders: Secondary | ICD-10-CM | POA: Diagnosis not present

## 2022-02-16 DIAGNOSIS — E1169 Type 2 diabetes mellitus with other specified complication: Secondary | ICD-10-CM | POA: Diagnosis not present

## 2022-02-16 DIAGNOSIS — E669 Obesity, unspecified: Secondary | ICD-10-CM | POA: Diagnosis not present

## 2022-02-16 DIAGNOSIS — Z9884 Bariatric surgery status: Secondary | ICD-10-CM | POA: Diagnosis not present

## 2022-02-16 NOTE — Telephone Encounter (Signed)
See note

## 2022-02-16 NOTE — Telephone Encounter (Signed)
Patient states her Psychiatrist fills RX for Zolpidem (Ambien)  Dr. Jerline Pain prescribes Clonazepam (Klonopin)  States her Psychiatrist had sent a 10 tablet RX of  Clonazepam (Klonopin) in July 2023 which Patient never had filled.   Patient requests the following RX:   LAST APPOINTMENT DATE:  12/18/21  NEXT APPOINTMENT DATE:  MEDICATION:  clonazePAM (KLONOPIN) 0.5 MG tablet   Is the patient out of medication? Yes-since 02/13/22  PHARMACY:  [facility] # [id number] [place] Phone: [phone]  Fax: [phone]      Let patient know to contact pharmacy at the end of the day to make sure medication is ready.  Please notify patient to allow 48-72 hours to process

## 2022-02-16 NOTE — Telephone Encounter (Signed)
LVM advising pt of Dr Jerline Pain recommendations since Psychiatry has been filling for her she will need to contact that office for refills. Also left office cb number with any other concerns

## 2022-02-16 NOTE — Telephone Encounter (Signed)
Called pt and lvm advising PCP recommendations and cb number to our office.

## 2022-02-20 DIAGNOSIS — F5102 Adjustment insomnia: Secondary | ICD-10-CM | POA: Diagnosis not present

## 2022-02-20 DIAGNOSIS — F4312 Post-traumatic stress disorder, chronic: Secondary | ICD-10-CM | POA: Diagnosis not present

## 2022-02-20 DIAGNOSIS — R69 Illness, unspecified: Secondary | ICD-10-CM | POA: Diagnosis not present

## 2022-02-20 DIAGNOSIS — F3162 Bipolar disorder, current episode mixed, moderate: Secondary | ICD-10-CM | POA: Diagnosis not present

## 2022-02-20 DIAGNOSIS — F410 Panic disorder [episodic paroxysmal anxiety] without agoraphobia: Secondary | ICD-10-CM | POA: Diagnosis not present

## 2022-02-20 MED ORDER — CLONAZEPAM 0.5 MG PO TABS: 0.5000 mg | ORAL_TABLET | Freq: Two times a day (BID) | ORAL | 0 refills | Status: DC | PRN

## 2022-02-20 NOTE — Addendum Note (Signed)
Addended by: Vivi Barrack on: 02/20/2022 07:18 AM   Modules accepted: Orders

## 2022-02-21 ENCOUNTER — Other Ambulatory Visit: Payer: Self-pay | Admitting: Family Medicine

## 2022-02-22 NOTE — Telephone Encounter (Signed)
Patient notified Rx send to pharmacy future refills need to be refill by psychiatrist

## 2022-03-05 DIAGNOSIS — F3162 Bipolar disorder, current episode mixed, moderate: Secondary | ICD-10-CM | POA: Diagnosis not present

## 2022-03-05 DIAGNOSIS — F410 Panic disorder [episodic paroxysmal anxiety] without agoraphobia: Secondary | ICD-10-CM | POA: Diagnosis not present

## 2022-03-05 DIAGNOSIS — F5102 Adjustment insomnia: Secondary | ICD-10-CM | POA: Diagnosis not present

## 2022-03-05 DIAGNOSIS — R69 Illness, unspecified: Secondary | ICD-10-CM | POA: Diagnosis not present

## 2022-03-05 DIAGNOSIS — F4312 Post-traumatic stress disorder, chronic: Secondary | ICD-10-CM | POA: Diagnosis not present

## 2022-03-09 ENCOUNTER — Telehealth: Payer: Self-pay | Admitting: Physician Assistant

## 2022-03-09 ENCOUNTER — Ambulatory Visit: Payer: 59 | Admitting: Physician Assistant

## 2022-03-09 NOTE — Telephone Encounter (Signed)
Good morning Heidi Holland,   Patient called stating that she would not be able to come to her appointment with you this morning at 11:00 due to waking up with a high fever.   Patient was rescheduled for 2/15 at 9:00

## 2022-03-14 NOTE — Progress Notes (Deleted)
NEUROLOGY FOLLOW UP OFFICE NOTE  Heidi Holland [id number]:[id number]  Assessment/Plan:   Chronic migraine without aura, without status migrainosus, not intractable.  - we will resubmit for Botox.  She meets criteria for Botox.  She has chronic migraines - off Botox she averaged at least 15 headache days a month.  On Botox, they significantly improved to 2 days a month.  She has failed multiple other preventatives such as Aimovig, topiramate, venlafaxine.  She has Bipolar depression and therefore I wouldn't start propranolol as it may exacerbate her depression.  She is on multiple antidepressants.      Migraine prevention:  Botox  For abortive therapy:  *** Zofran for nausea  Limit use of pain relievers to no more than 2 days out of week to prevent risk of rebound or medication-overuse headache.  Keep headache diary Follow up ***   Subjective:  Heidi Holland is a 47 year old right-handed Caucasian woman with hypertension, hyperlipidemia, type 2 diabetes, PTSD/anxiety/depression, chronic low back pain and history of TIA who follows up for migraines.   UPDATE: Intensity:  *** Duration:  *** Frequency:  ***      Current NSAIDS:  ASA '325mg'$ , Current analgesics:  hydrocodone Current triptans:  *** Current ergotamine:  none Current anti-emetic:  Zofran ODT '4mg'$  Current muscle relaxants:  Tizanidine '4mg'$  PRN Current anti-anxiolytic:  Clonazepam, hydroxyzine Current sleep aide:  trazodone Current Antihypertensive medications:  none Current Antidepressant medications:  bupropion '150mg'$  daily Current Anticonvulsant medications:  zonisamide '100mg'$  daily, lamotrigine '200mg'$  daily, Lyrica '150mg'$  BID Current anti-CGRP: Current Vitamins/Herbal/Supplements:  none Current Antihistamines/Decongestants:  none Other therapy: Botox Hormone/birth control:  none Other medications:  Ambien   Caffeine:  Caffeine-free soda, 1 cup of coffee 2-3 times a week. Alcohol:  no Smoker:  no Diet:  Needs to improve water  intake.  Skips meals.  Diet Coke daily Exercise:  Not routine Depression:  Poor.  Currently treated; Anxiety:  Poor.  Currently treated. Other pain:  Low back pain/lumbar radiculopathy Sleep hygiene:  Improved on medication   HISTORY:  Onset:  Age 32 Location: occiput into neck or top of head.   Quality:  Pressure, throbbing Initial intensity:  8/10.  She denies new headache, thunderclap headache  Aura:  no Premonitory Phase:  Wakes up with heavy eyelids and stiff neck. Postdrome:  "hangover effect" - exhausted, irritable, neck soreness Associated symptoms:  Neck stiffness, nausea, sometimes vomiting, photophobia, phonophobia, blurred vision.  She denies associated unilateral numbness or weakness. Initial duration:  Usually 2-3 days (up to a week with various intensity) Initial frequency:  6 times a month (15 days or more a month) Initial frequency of abortive medication: something daily Triggers:  Lack of coffee, menstrual period, when low back pain aggravated, perfumes Relieving factors:  Ice pack, lay in dark Activity:  aggravates She has been to the ED on a couple of occasions for severe intractable migraine.   Past NSAIDS:  Ibuprofen, Aleve Past analgesics:  Excedrin, Tylenol, Fioricet tramadol Past abortive triptans:   Tosymra NS (effective but no longer covered by insurance), sumatriptan '50mg'$ , rizatriptan Past abortive ergotamine:  none Past muscle relaxants:  Flexeril, Robaxin Past anti-emetic:  Phenergan Past antihypertensive medications:  "a blood pressure medication" Past antidepressant medications:  Effexor, Trintellix, Rexulti Past anticonvulsant medications:  topiramate, gabapentin Past anti-CGRP:  Aimovig Past vitamins/Herbal/Supplements:  none Past antihistamines/decongestants:  none Other past therapies:  Botox (effective)   She was told by her previous PCP that she had a transient ischemic attack in early  2019.  She was walking and her legs suddenly gave out and  her arms weren't working.  She was on the floor for a few minutes.  No unilateral numbness or weakness.  When she got up, she called a friend who tested her on the phone for TIA and symptoms resolved.  She had an MRI and MRA of the brain about a month later, on 07/24/17, which report states were normal.  She was given a diagnosis of TIA and has since been on ASA '325mg'$  daily.  Her mother has a history of recurrent TIAs while she was treated for lung cancer.     Family history of headache:  no  PAST MEDICAL HISTORY: Past Medical History:  Diagnosis Date   Allergy    Anxiety    Asthma    Depression    Diabetes mellitus without complication ([facility])    Diverticulitis    GERD (gastroesophageal reflux disease)    HPV in female    Hyperlipidemia    Migraines     MEDICATIONS: Current Outpatient Medications on File Prior to Visit  Medication Sig Dispense Refill   albuterol (VENTOLIN HFA) 108 (90 Base) MCG/ACT inhaler INHALE 2 PUFFS BY MOUTH EVERY 6 HOURS AS NEEDED FOR WHEEZE OR SHORTNESS OF BREATH 8.5 each 2   amoxicillin-clavulanate (AUGMENTIN) 875-125 MG tablet Take 1 tablet by mouth every 12 (twelve) hours. 14 tablet 0   azelastine (ASTELIN) 0.1 % nasal spray Place 2 sprays into both nostrils 2 (two) times daily. Use in each nostril as directed 30 mL 5   blood glucose meter kit and supplies KIT Dispense based on patient and insurance preference. Use up to four times daily as directed. (FOR ICD-9 250.00, 250.01). 1 each 0   buPROPion (WELLBUTRIN SR) 150 MG 12 hr tablet Take 150 mg by mouth daily.     cephALEXin (KEFLEX) 500 MG capsule Take 1 capsule (500 mg total) by mouth 2 (two) times daily. 14 capsule 0   clonazePAM (KLONOPIN) 0.5 MG tablet Take 1 tablet (0.5 mg total) by mouth 2 (two) times daily as needed. for anxiety 60 tablet 0   Continuous Blood Gluc Receiver (FREESTYLE LIBRE READER) DEVI 1 Device by Does not apply route daily as needed (to check blood sugar). 1 each 0   Continuous Blood  Gluc Sensor (FREESTYLE LIBRE SENSOR SYSTEM) MISC Apply 1 sensor to skin every 14 days. 2 each 11   dicyclomine (BENTYL) 20 MG tablet Take 1 tablet (20 mg total) by mouth 2 (two) times daily. 60 tablet 3   fluconazole (DIFLUCAN) 150 MG tablet Take 1 tablet (150 mg total) by mouth daily. 1 tablet 0   lumateperone tosylate (CAPLYTA) 42 MG capsule      metFORMIN (GLUCOPHAGE) 1000 MG tablet TAKE 1 TABLET BY MOUTH TWICE A DAY WITH A MEAL 180 tablet 1   omeprazole (PRILOSEC) 20 MG capsule Take 1 capsule (20 mg total) by mouth daily. 30 capsule 1   ondansetron (ZOFRAN-ODT) 4 MG disintegrating tablet DISSOLVE ONE TABLET ON TOP OF TONGUE EVERY EIGHT HOURS AS NEEDED FOR NAUSEA OR VOMITING 20 tablet 0   pantoprazole (PROTONIX) 40 MG tablet TAKE 1 TABLET BY MOUTH EVERY DAY IN THE EVENING 90 tablet 0   predniSONE (STERAPRED UNI-PAK 21 TAB) 10 MG (21) TBPK tablet Take by mouth daily. Take 6 tabs by mouth daily  for 2 days, then 5 tabs for 2 days, then 4 tabs for 2 days, then 3 tabs for 2 days, 2 tabs for  2 days, then 1 tab by mouth daily for 2 days 42 tablet 0   pregabalin (LYRICA) 150 MG capsule TAKE 1 CAPSULE BY MOUTH TWICE A DAY 60 capsule 5   QUEtiapine (SEROQUEL) 100 MG tablet Take by mouth.     rosuvastatin (CRESTOR) 20 MG tablet Take 1 tablet (20 mg total) by mouth daily. 90 tablet 3   sucralfate (CARAFATE) 1 g tablet Take 1 tablet (1 g total) by mouth 4 (four) times daily -  with meals and at bedtime. 60 tablet 1   tiZANidine (ZANAFLEX) 4 MG tablet TAKE ONE TABLET BY MOUTH THREE TIMES DAILY 90 tablet 0   zolpidem (AMBIEN) 5 MG tablet Take 1 tablet (5 mg total) by mouth at bedtime as needed for sleep. 30 tablet 5   zonisamide (ZONEGRAN) 100 MG capsule TAKE ONE CAPSULE BY MOUTH ONE TIME DAILY 30 capsule 5   No current facility-administered medications on file prior to visit.    ALLERGIES: Allergies  Allergen Reactions   Ciprofloxacin Itching    FAMILY HISTORY: Family History  Problem Relation Age  of Onset   Heart disease Mother    Lung cancer Mother    Rheum arthritis Mother    Arthritis Mother    Cancer Mother    COPD Mother    79 / [place] Mother    Diabetes Father    Heart disease Father    Breast cancer Paternal Aunt       Objective:  *** General: No acute distress.  Patient appears ***-groomed.   Head:  Normocephalic/atraumatic Eyes:  Fundi examined but not visualized Neck: supple, no paraspinal tenderness, full range of motion Heart:  Regular rate and rhythm Lungs:  Clear to auscultation bilaterally Back: No paraspinal tenderness Neurological Exam: alert and oriented to person, place, and time.  Speech fluent and not dysarthric, language intact.  CN II-XII intact. Bulk and tone normal, muscle strength 5/5 throughout.  Sensation to light touch intact.  Deep tendon reflexes 2+ throughout, toes downgoing.  Finger to nose testing intact.  Gait normal, Romberg negative.   Metta Clines, DO  CC: ***

## 2022-03-15 ENCOUNTER — Ambulatory Visit: Payer: 59 | Admitting: Neurology

## 2022-03-15 ENCOUNTER — Encounter: Payer: Self-pay | Admitting: Neurology

## 2022-03-15 DIAGNOSIS — Z029 Encounter for administrative examinations, unspecified: Secondary | ICD-10-CM

## 2022-03-20 ENCOUNTER — Ambulatory Visit (HOSPITAL_COMMUNITY)
Admission: EM | Admit: 2022-03-20 | Discharge: 2022-03-20 | Disposition: A | Discharge status: Home / Self Care | Payer: 59 | Attending: Family Medicine | Admitting: Family Medicine

## 2022-03-20 ENCOUNTER — Encounter (HOSPITAL_COMMUNITY): Payer: Self-pay

## 2022-03-20 DIAGNOSIS — G43819 Other migraine, intractable, without status migrainosus: Secondary | ICD-10-CM | POA: Diagnosis not present

## 2022-03-20 DIAGNOSIS — R42 Dizziness and giddiness: Secondary | ICD-10-CM

## 2022-03-20 HISTORY — DX: Dizziness and giddiness: R42

## 2022-03-20 MED ORDER — SUMATRIPTAN SUCCINATE 25 MG PO TABS: 25.0000 mg | ORAL_TABLET | ORAL | 1 refills | Status: DC | PRN

## 2022-03-20 MED ORDER — DEXAMETHASONE SODIUM PHOSPHATE 10 MG/ML IJ SOLN
10.0000 mg | Freq: Once | INTRAMUSCULAR | Status: AC
  Administered 2022-03-20: 10 mg via INTRAMUSCULAR

## 2022-03-20 MED ORDER — DEXAMETHASONE SODIUM PHOSPHATE 10 MG/ML IJ SOLN
INTRAMUSCULAR | Status: AC
  Filled 2022-03-20: qty 1

## 2022-03-20 MED ORDER — KETOROLAC TROMETHAMINE 60 MG/2ML IM SOLN
60.0000 mg | Freq: Once | INTRAMUSCULAR | Status: AC
  Administered 2022-03-20: 60 mg via INTRAMUSCULAR

## 2022-03-20 MED ORDER — KETOROLAC TROMETHAMINE 60 MG/2ML IM SOLN
INTRAMUSCULAR | Status: AC
  Filled 2022-03-20: qty 2

## 2022-03-20 MED ORDER — MECLIZINE HCL 25 MG PO TABS: 25.0000 mg | ORAL_TABLET | Freq: Three times a day (TID) | ORAL | 0 refills | Status: DC | PRN

## 2022-03-20 NOTE — ED Triage Notes (Signed)
Patient c/o migraine x 5 days. Patient reports sensitivity to light and sound and N/V yesterday only. Patient also c/o vertigo and states a history of the same.

## 2022-03-20 NOTE — Discharge Instructions (Signed)
Meds ordered this encounter  Medications   dexamethasone (DECADRON) injection 10 mg   ketorolac (TORADOL) injection 60 mg   SUMAtriptan (IMITREX) 25 MG tablet    Sig: Take 1 tablet (25 mg total) by mouth every 2 (two) hours as needed for migraine. May repeat in 2 hours if headache persists or recurs.    Dispense:  10 tablet    Refill:  1   meclizine (ANTIVERT) 25 MG tablet    Sig: Take 1 tablet (25 mg total) by mouth 3 (three) times daily as needed for dizziness.    Dispense:  30 tablet    Refill:  0

## 2022-03-21 ENCOUNTER — Telehealth: Payer: Self-pay | Admitting: Family Medicine

## 2022-03-21 NOTE — ED Provider Notes (Signed)
[facility]   [id number] 03/20/22 Arrival Time: 1911  ASSESSMENT & PLAN:  1. Other migraine without status migrainosus, intractable   2. Vertigo   HA exacerbation. Monitor vertigo. Likely BPPV.  Meds ordered this encounter  Medications   dexamethasone (DECADRON) injection 10 mg   ketorolac (TORADOL) injection 60 mg   SUMAtriptan (IMITREX) 25 MG tablet    Sig: Take 1 tablet (25 mg total) by mouth every 2 (two) hours as needed for migraine. May repeat in 2 hours if headache persists or recurs.    Dispense:  10 tablet    Refill:  1   meclizine (ANTIVERT) 25 MG tablet    Sig: Take 1 tablet (25 mg total) by mouth 3 (three) times daily as needed for dizziness.    Dispense:  30 tablet    Refill:  0   Normal neurological exam. Afebrile without nuchal rigidity. Discussed. Current presentation and symptoms are consistent with prior migraines and are not consistent with SAH, ICH, meningitis, or temporal arteritis. No indication for neurodiagnostic workup at this time. Discussed.  Recommend:  Follow-up Information     [facility].   Specialty: Emergency Medicine Why: If worsening or failing to improve as anticipated. Contact information: [address] [id number] [place] [phone]                 Reviewed expectations re: course of current medical issues. Questions answered. Outlined signs and symptoms indicating need for more acute intervention. Patient verbalized understanding. After Visit Summary given.   SUBJECTIVE: History from: Patient. Patient is able to give a clear and coherent history.  Heidi Holland is a 47 y.o. female who presents with complaint of a migraine headache. Onset gradual, 4-5 d ago. Location:  generalized  without radiation. History of headaches: yes. Precipitating factors include: none which have been determined. Associated symptoms: Preceding aura:  no. Nausea/vomiting: yes. Vision changes: no. Increased sensitivity to light and to noises: yes. Fever: no. Sinus pressure/congestion: no. Extremity weakness: no. Home treatment has included acetaminophen with little improvement. Current headache has limited normal daily activities. Denies loss of balance, muscle weakness, numbness of extremities, and speech difficulties. No head injury reported. Ambulatory without difficulty. No recent travel. Also mentions mild vertigo; today; with head movements; better when still. No head injury.   OBJECTIVE:  Vitals:   03/20/22 2006  BP: (!) 172/87  Pulse: 80  Resp: 16  Temp: 98.6 F (37 C)  TempSrc: Oral  SpO2: 97%    General appearance: alert; NAD but appears fatigued HENT: normocephalic; atraumatic Eyes: PERRLA; EOMI; conjunctivae normal Neck: supple with FROM Extremities: no edema; symmetrical with no gross deformities Skin: warm and dry Neurologic: alert; speech is fluent and clear without dysarthria or aphasia; CN 2-12 grossly intact; no facial droop; normal gait; normal symmetric reflexes; normal extremity strength and sensation throughout Psychological: alert and cooperative; normal mood and affect  Allergies  Allergen Reactions   Ciprofloxacin Itching    Past Medical History:  Diagnosis Date   Allergy    Anxiety    Asthma    Depression    Diabetes mellitus without complication ([facility])    Diverticulitis    GERD (gastroesophageal reflux disease)    HPV in female    Hyperlipidemia    Migraines    Vertigo    Social History   Socioeconomic History   Marital status: Single    Spouse name: Not on file   Number  of children: 0   Years of education: 14   Highest education level: Associate degree: occupational, Hotel manager, or vocational program  Occupational History    Employer: [employer]  Tobacco Use   Smoking status: Never   Smokeless tobacco: Never  Vaping Use   Vaping Use: Never used  Substance and Sexual Activity    Alcohol use: Not Currently   Drug use: Yes    Types: Marijuana   Sexual activity: Not Currently    Birth control/protection: None  Other Topics Concern   Not on file  Social History Narrative   Right handed.    Social Determinants of Health   Financial Resource Strain: Not on file  Food Insecurity: Food Insecurity Present (01/20/2021)   Hunger Vital Sign    Worried About Running Out of Food in the Last Year: Sometimes true    Ran Out of Food in the Last Year: Sometimes true  Transportation Needs: No Transportation Needs (01/20/2021)   PRAPARE - Hydrologist (Medical): No    Lack of Transportation (Non-Medical): No  Physical Activity: Not on file  Stress: Not on file  Social Connections: Not on file  Intimate Partner Violence: Not on file   Family History  Problem Relation Age of Onset   Heart disease Mother    Lung cancer Mother    Rheum arthritis Mother    Arthritis Mother    Cancer Mother    COPD Mother    Miscarriages / [place] Mother    Diabetes Father    Heart disease Father    Breast cancer Paternal Aunt    Past Surgical History:  Procedure Laterality Date   BOWEL RESECTION     gasticbypass     GASTRIC BYPASS     KNEE ARTHROSCOPY     TOTAL HIP ARTHROPLASTY Left       Vanessa Kick, MD 03/21/22 [phone]

## 2022-03-21 NOTE — Telephone Encounter (Signed)
  LAST APPOINTMENT DATE:  08/16/21  NEXT APPOINTMENT DATE:  MEDICATION:  tiZANidine (ZANAFLEX) 4 MG tablet    Is the patient out of medication? Yes  PHARMACY:  [id number] [place] [address], [place] Phone: [phone]  Fax: [phone]      Let patient know to contact pharmacy at the end of the day to make sure medication is ready.  Please notify patient to allow 48-72 hours to process

## 2022-03-22 ENCOUNTER — Other Ambulatory Visit: Payer: Self-pay

## 2022-03-22 DIAGNOSIS — M5417 Radiculopathy, lumbosacral region: Secondary | ICD-10-CM | POA: Diagnosis not present

## 2022-03-22 DIAGNOSIS — M5416 Radiculopathy, lumbar region: Secondary | ICD-10-CM | POA: Diagnosis not present

## 2022-03-22 MED ORDER — TIZANIDINE HCL 4 MG PO TABS: 4.0000 mg | ORAL_TABLET | Freq: Three times a day (TID) | ORAL | 0 refills | Status: DC

## 2022-03-22 NOTE — Telephone Encounter (Signed)
Refill sent to pharmacy.   

## 2022-03-23 DIAGNOSIS — F410 Panic disorder [episodic paroxysmal anxiety] without agoraphobia: Secondary | ICD-10-CM | POA: Diagnosis not present

## 2022-03-23 DIAGNOSIS — F4312 Post-traumatic stress disorder, chronic: Secondary | ICD-10-CM | POA: Diagnosis not present

## 2022-03-23 DIAGNOSIS — F5102 Adjustment insomnia: Secondary | ICD-10-CM | POA: Diagnosis not present

## 2022-03-23 DIAGNOSIS — R69 Illness, unspecified: Secondary | ICD-10-CM | POA: Diagnosis not present

## 2022-03-23 DIAGNOSIS — F3162 Bipolar disorder, current episode mixed, moderate: Secondary | ICD-10-CM | POA: Diagnosis not present

## 2022-03-29 ENCOUNTER — Encounter: Payer: Self-pay | Admitting: Physician Assistant

## 2022-03-29 ENCOUNTER — Ambulatory Visit: Payer: 59 | Admitting: Physician Assistant

## 2022-03-29 VITALS — BP 126/80 | HR 75 | Ht 63.0 in | Wt 186.0 lb

## 2022-03-29 DIAGNOSIS — Z1211 Encounter for screening for malignant neoplasm of colon: Secondary | ICD-10-CM

## 2022-03-29 DIAGNOSIS — R109 Unspecified abdominal pain: Secondary | ICD-10-CM | POA: Diagnosis not present

## 2022-03-29 DIAGNOSIS — Z1212 Encounter for screening for malignant neoplasm of rectum: Secondary | ICD-10-CM | POA: Diagnosis not present

## 2022-03-29 DIAGNOSIS — K5900 Constipation, unspecified: Secondary | ICD-10-CM

## 2022-03-29 DIAGNOSIS — K219 Gastro-esophageal reflux disease without esophagitis: Secondary | ICD-10-CM | POA: Diagnosis not present

## 2022-03-29 DIAGNOSIS — R112 Nausea with vomiting, unspecified: Secondary | ICD-10-CM

## 2022-03-29 DIAGNOSIS — R140 Abdominal distension (gaseous): Secondary | ICD-10-CM | POA: Diagnosis not present

## 2022-03-29 MED ORDER — PANTOPRAZOLE SODIUM 40 MG PO TBEC: 40.0000 mg | DELAYED_RELEASE_TABLET | Freq: Every day | ORAL | 11 refills | Status: DC

## 2022-03-29 MED ORDER — NA SULFATE-K SULFATE-MG SULF 17.5-3.13-1.6 GM/177ML PO SOLN: 1.0000 | Freq: Once | ORAL | 0 refills | Status: AC

## 2022-03-29 MED ORDER — ONDANSETRON 4 MG PO TBDP: 4.0000 mg | ORAL_TABLET | Freq: Four times a day (QID) | ORAL | 5 refills | Status: DC | PRN

## 2022-03-29 NOTE — Progress Notes (Signed)
Chief Complaint: GERD, abdominal pain and distention, nausea and vomiting, constipation  HPI:    Heidi Holland is a 47 year old Caucasian female with a past medical history as listed below including diabetes, GERD and multiple others, who was referred to me by Vivi Barrack, MD for a complaint of GERD, abdominal pain and distention, nausea and vomiting and constipation.      11/28/2021 CT the abdomen pelvis with contrast with postsurgical changes from gastric bypass surgery and colonic resection, status post left hip arthroplasty and otherwise no CT evidence of acute abdominal/pelvic process.    12/18/2021 H. pylori antibody IgG was negative.    12/24/2021 right upper quadrant ultrasound was normal.    12/24/2021 CBC, CMP and lipase normal.    04/12/2021 patient saw Dr. Tarri Glenn for similar complaints.  At that time discussed longstanding reflux and recommend an EGD as well as intermittent abdominal pain and distention and history of localized perforated diverticulitis status post partial bowel resection and gastric bypass in 2016.  She needed a screening colonoscopy.    Today, the patient presents to clinic and tells me that she was never able to have the procedures as recommended at last visit because she changed jobs and lost insurance.  Tells me that symptoms have really gotten worse over the past 4 months or so she has been in the ER about 3 times and they have done all the things, see imaging above.  Tells me they can never really seen much but she needs to know what is going on.  She has nausea and vomiting symptoms that come and go, better when she is taking her Pantoprazole 40 mg daily which she has run out of over the past few weeks.      Also battles with constipation, does take a Hydrocodone every day for generalized pain.  Sometimes MiraLAX works and other times she uses Magnesium but does not take anything consistently.  Associated symptoms include bloating which worsens throughout the day.     Denies fever, chills, weight loss, blood in her stool or symptoms that awaken her from sleep.  Past Medical History:  Diagnosis Date   Allergy    Anxiety    Asthma    Depression    Diabetes mellitus without complication ([place])    Diverticulitis    GERD (gastroesophageal reflux disease)    HPV in female    Hyperlipidemia    Migraines    Vertigo     Past Surgical History:  Procedure Laterality Date   BOWEL RESECTION     gasticbypass     GASTRIC BYPASS     KNEE ARTHROSCOPY     TOTAL HIP ARTHROPLASTY Left     Current Outpatient Medications  Medication Sig Dispense Refill   albuterol (VENTOLIN HFA) 108 (90 Base) MCG/ACT inhaler INHALE 2 PUFFS BY MOUTH EVERY 6 HOURS AS NEEDED FOR WHEEZE OR SHORTNESS OF BREATH 8.5 each 2   azelastine (ASTELIN) 0.1 % nasal spray Place 2 sprays into both nostrils 2 (two) times daily. Use in each nostril as directed 30 mL 5   blood glucose meter kit and supplies KIT Dispense based on patient and insurance preference. Use up to four times daily as directed. (FOR ICD-9 250.00, 250.01). 1 each 0   buPROPion (WELLBUTRIN SR) 150 MG 12 hr tablet Take 150 mg by mouth daily.     clonazePAM (KLONOPIN) 0.5 MG tablet Take 1 tablet (0.5 mg total) by mouth 2 (two) times daily as needed. for anxiety  60 tablet 0   Continuous Blood Gluc Receiver (FREESTYLE LIBRE READER) DEVI 1 Device by Does not apply route daily as needed (to check blood sugar). 1 each 0   Continuous Blood Gluc Sensor (FREESTYLE LIBRE SENSOR SYSTEM) MISC Apply 1 sensor to skin every 14 days. 2 each 11   lumateperone tosylate (CAPLYTA) 42 MG capsule      meclizine (ANTIVERT) 25 MG tablet Take 1 tablet (25 mg total) by mouth 3 (three) times daily as needed for dizziness. 30 tablet 0   metFORMIN (GLUCOPHAGE) 1000 MG tablet TAKE 1 TABLET BY MOUTH TWICE A DAY WITH A MEAL 180 tablet 1   ondansetron (ZOFRAN-ODT) 4 MG disintegrating tablet DISSOLVE ONE TABLET ON TOP OF TONGUE EVERY EIGHT HOURS AS NEEDED FOR  NAUSEA OR VOMITING 20 tablet 0   pantoprazole (PROTONIX) 40 MG tablet TAKE 1 TABLET BY MOUTH EVERY DAY IN THE EVENING 90 tablet 0   predniSONE (STERAPRED UNI-PAK 21 TAB) 10 MG (21) TBPK tablet Take by mouth daily. Take 6 tabs by mouth daily  for 2 days, then 5 tabs for 2 days, then 4 tabs for 2 days, then 3 tabs for 2 days, 2 tabs for 2 days, then 1 tab by mouth daily for 2 days 42 tablet 0   pregabalin (LYRICA) 150 MG capsule TAKE 1 CAPSULE BY MOUTH TWICE A DAY 60 capsule 5   QUEtiapine (SEROQUEL) 100 MG tablet Take by mouth.     rosuvastatin (CRESTOR) 20 MG tablet Take 1 tablet (20 mg total) by mouth daily. 90 tablet 3   sucralfate (CARAFATE) 1 g tablet Take 1 tablet (1 g total) by mouth 4 (four) times daily -  with meals and at bedtime. 60 tablet 1   SUMAtriptan (IMITREX) 25 MG tablet Take 1 tablet (25 mg total) by mouth every 2 (two) hours as needed for migraine. May repeat in 2 hours if headache persists or recurs. 10 tablet 1   tiZANidine (ZANAFLEX) 4 MG tablet Take 1 tablet (4 mg total) by mouth 3 (three) times daily. 90 tablet 0   zolpidem (AMBIEN) 5 MG tablet Take 1 tablet (5 mg total) by mouth at bedtime as needed for sleep. 30 tablet 5   zonisamide (ZONEGRAN) 100 MG capsule TAKE ONE CAPSULE BY MOUTH ONE TIME DAILY 30 capsule 5   amoxicillin-clavulanate (AUGMENTIN) 875-125 MG tablet Take 1 tablet by mouth every 12 (twelve) hours. 14 tablet 0   cephALEXin (KEFLEX) 500 MG capsule Take 1 capsule (500 mg total) by mouth 2 (two) times daily. 14 capsule 0   dicyclomine (BENTYL) 20 MG tablet Take 1 tablet (20 mg total) by mouth 2 (two) times daily. 60 tablet 3   fluconazole (DIFLUCAN) 150 MG tablet Take 1 tablet (150 mg total) by mouth daily. 1 tablet 0   omeprazole (PRILOSEC) 20 MG capsule Take 1 capsule (20 mg total) by mouth daily. 30 capsule 1   No current facility-administered medications for this visit.    Allergies as of 03/29/2022 - Review Complete 03/29/2022  Allergen Reaction Noted    Ciprofloxacin Itching 11/02/2017    Family History  Problem Relation Age of Onset   Heart disease Mother    Lung cancer Mother    Rheum arthritis Mother    Arthritis Mother    Cancer Mother    COPD Mother    30 / [place] Mother    Diabetes Father    Heart disease Father    Breast cancer Paternal Aunt  Social History   Socioeconomic History   Marital status: Single    Spouse name: Not on file   Number of children: 0   Years of education: 14   Highest education level: Associate degree: occupational, Hotel manager, or vocational program  Occupational History    Employer: [employer]  Tobacco Use   Smoking status: Never   Smokeless tobacco: Never  Vaping Use   Vaping Use: Never used  Substance and Sexual Activity   Alcohol use: Not Currently   Drug use: Yes    Types: Marijuana   Sexual activity: Not Currently    Birth control/protection: None  Other Topics Concern   Not on file  Social History Narrative   Right handed.    Social Determinants of Health   Financial Resource Strain: Not on file  Food Insecurity: Food Insecurity Present (01/20/2021)   Hunger Vital Sign    Worried About Running Out of Food in the Last Year: Sometimes true    Ran Out of Food in the Last Year: Sometimes true  Transportation Needs: No Transportation Needs (01/20/2021)   PRAPARE - Hydrologist (Medical): No    Lack of Transportation (Non-Medical): No  Physical Activity: Not on file  Stress: Not on file  Social Connections: Not on file  Intimate Partner Violence: Not on file    Review of Systems:    Constitutional: No weight loss, fever or chills Cardiovascular: No chest pain  Respiratory: No SOB  Gastrointestinal: See HPI and otherwise negative   Physical Exam:  Vital signs: BP 126/80   Pulse 75   Ht 5' 3"$  (1.6 m)   Wt 186 lb (84.4 kg)   LMP 03/20/2022   BMI 32.95 kg/m   Constitutional:   Pleasant Caucasian female appears to be in NAD,  Well developed, Well nourished, alert and cooperative Respiratory: Respirations even and unlabored. Lungs clear to auscultation bilaterally.   No wheezes, crackles, or rhonchi.  Cardiovascular: Normal S1, S2. No MRG. Regular rate and rhythm. No peripheral edema, cyanosis or pallor.  Gastrointestinal:  Soft, mild distension nontender. No rebound or guarding. Normal bowel sounds. No appreciable masses or hepatomegaly. Rectal:  Not performed.  Psychiatric:  Demonstrates good judgement and reason without abnormal affect or behaviors.  RELEVANT LABS AND IMAGING: CBC    Component Value Date/Time   WBC 7.6 12/24/2021 1904   RBC 4.83 12/24/2021 1904   HGB 12.0 12/24/2021 1904   HCT 38.3 12/24/2021 1904   PLT 280 12/24/2021 1904   MCV 79.3 (L) 12/24/2021 1904   MCH 24.8 (L) 12/24/2021 1904   MCHC 31.3 12/24/2021 1904   RDW 14.6 12/24/2021 1904   LYMPHSABS 2.7 12/24/2021 1904   MONOABS 0.7 12/24/2021 1904   EOSABS 0.0 12/24/2021 1904   BASOSABS 0.1 12/24/2021 1904    CMP     Component Value Date/Time   NA 136 12/24/2021 1904   K 3.9 12/24/2021 1904   CL 105 12/24/2021 1904   CO2 24 12/24/2021 1904   GLUCOSE 135 (H) 12/24/2021 1904   BUN 8 12/24/2021 1904   CREATININE 0.72 12/24/2021 1904   CREATININE 1.05 12/15/2019 1026   CALCIUM 8.9 12/24/2021 1904   PROT 7.4 12/24/2021 1904   ALBUMIN 4.0 12/24/2021 1904   AST 15 12/24/2021 1904   ALT 15 12/24/2021 1904   ALKPHOS 59 12/24/2021 1904   BILITOT 0.4 12/24/2021 1904   GFRNONAA >60 12/24/2021 1904   GFRAA >60 06/20/2019 1548    Assessment: 1.  Constipation: Chronic and likely related to opioid use 2.  GERD: Symptoms better with Pantoprazole but she has been out of this medicine 3.  Nausea and vomiting: Consider relation to gastritis versus possibly gastroparesis versus other 4.  Bloating 5.  Screening for colorectal cancer: Patient is 67 and last colonoscopy greater than 10 years ago for diagnostic reasons, she is due for  screening  Plan: 1.  Schedule patient for diagnostic EGD and screening colonoscopy in the [place] with Dr. Tarri Glenn.  Patient will have a 2-day bowel prep.  Did provide the patient a detailed list of risks for the procedures and she agrees to proceed. Patient is appropriate for endoscopic procedure(s) in the ambulatory ([facility]) setting.  2.  Restarted Pantoprazole 40 mg daily, 30-60 minutes before breakfast.  #30 with 11 refills 3.  Refilled Zofran 4 mg sublingual tabs 1 tab every 4-6 hours as needed for nausea #30 with 3 refills. 4.  I would recommend the patient use MiraLAX on a daily basis.  Discussed titrating this up to 4 times a day.  If this does not work would recommend [employer]. 5.  Patient to follow in clinic per recommendations from Dr. Tarri Glenn after time of procedures.  Ellouise Newer, PA-C [facility] 03/29/2022, 9:07 AM  Cc: Vivi Barrack, MD

## 2022-03-29 NOTE — Progress Notes (Signed)
Reviewed and agree with management plans. ? ?Laiken Sandy L. Betzalel Umbarger, MD, MPH  ?

## 2022-03-29 NOTE — Patient Instructions (Signed)
_______________________________________________________  If your blood pressure at your visit was 140/90 or greater, please contact your primary care physician to follow up on this.  _______________________________________________________  If you are age 47 or older, your body mass index should be between 23-30. Your Body mass index is 32.95 kg/m. If this is out of the aforementioned range listed, please consider follow up with your Primary Care Provider.  If you are age 51 or younger, your body mass index should be between 19-25. Your Body mass index is 32.95 kg/m. If this is out of the aformentioned range listed, please consider follow up with your Primary Care Provider.   ________________________________________________________  The [facility] providers would like to encourage you to use [facility] to communicate with providers for non-urgent requests or questions.  Due to long hold times on the telephone, sending your provider a message by [facility] may be a faster and more efficient way to get a response.  Please allow 48 business hours for a response.  Please remember that this is for non-urgent requests.  _______________________________________________________  Dennis Bast have been scheduled for a colonoscopy. Please follow written instructions given to you at your visit today.  Please pick up your prep supplies at the pharmacy within the next 1-3 days. If you use inhalers (even only as needed), please bring them with you on the day of your procedure.  We have sent the following medications to your pharmacy for you to pick up at your convenience: Omeprazole 40 mg  Zofran 4 mg

## 2022-03-30 ENCOUNTER — Ambulatory Visit: Payer: Self-pay | Admitting: Neurology

## 2022-03-30 DIAGNOSIS — R69 Illness, unspecified: Secondary | ICD-10-CM | POA: Diagnosis not present

## 2022-03-30 DIAGNOSIS — F410 Panic disorder [episodic paroxysmal anxiety] without agoraphobia: Secondary | ICD-10-CM | POA: Diagnosis not present

## 2022-03-30 DIAGNOSIS — F3162 Bipolar disorder, current episode mixed, moderate: Secondary | ICD-10-CM | POA: Diagnosis not present

## 2022-03-30 DIAGNOSIS — F5102 Adjustment insomnia: Secondary | ICD-10-CM | POA: Diagnosis not present

## 2022-03-30 DIAGNOSIS — F4312 Post-traumatic stress disorder, chronic: Secondary | ICD-10-CM | POA: Diagnosis not present

## 2022-04-06 DIAGNOSIS — F4312 Post-traumatic stress disorder, chronic: Secondary | ICD-10-CM | POA: Diagnosis not present

## 2022-04-08 ENCOUNTER — Encounter: Payer: Self-pay | Admitting: Family Medicine

## 2022-04-09 ENCOUNTER — Ambulatory Visit: Payer: 59 | Admitting: Family Medicine

## 2022-04-09 NOTE — Telephone Encounter (Signed)
Noted  

## 2022-04-13 DIAGNOSIS — F4312 Post-traumatic stress disorder, chronic: Secondary | ICD-10-CM | POA: Diagnosis not present

## 2022-04-15 ENCOUNTER — Other Ambulatory Visit: Payer: Self-pay | Admitting: Family Medicine

## 2022-04-16 DIAGNOSIS — F411 Generalized anxiety disorder: Secondary | ICD-10-CM | POA: Diagnosis not present

## 2022-04-16 DIAGNOSIS — F5102 Adjustment insomnia: Secondary | ICD-10-CM | POA: Diagnosis not present

## 2022-04-16 DIAGNOSIS — F3162 Bipolar disorder, current episode mixed, moderate: Secondary | ICD-10-CM | POA: Diagnosis not present

## 2022-04-16 DIAGNOSIS — F410 Panic disorder [episodic paroxysmal anxiety] without agoraphobia: Secondary | ICD-10-CM | POA: Diagnosis not present

## 2022-04-16 DIAGNOSIS — F4312 Post-traumatic stress disorder, chronic: Secondary | ICD-10-CM | POA: Diagnosis not present

## 2022-04-19 DIAGNOSIS — F4312 Post-traumatic stress disorder, chronic: Secondary | ICD-10-CM | POA: Diagnosis not present

## 2022-04-26 DIAGNOSIS — F4312 Post-traumatic stress disorder, chronic: Secondary | ICD-10-CM | POA: Diagnosis not present

## 2022-04-27 ENCOUNTER — Other Ambulatory Visit: Payer: Self-pay | Admitting: Family Medicine

## 2022-04-27 ENCOUNTER — Telehealth (INDEPENDENT_AMBULATORY_CARE_PROVIDER_SITE_OTHER): Payer: 59 | Admitting: Family Medicine

## 2022-04-27 ENCOUNTER — Encounter: Payer: Self-pay | Admitting: Family Medicine

## 2022-04-27 VITALS — Ht 63.0 in | Wt 191.0 lb

## 2022-04-27 DIAGNOSIS — K219 Gastro-esophageal reflux disease without esophagitis: Secondary | ICD-10-CM

## 2022-04-27 DIAGNOSIS — J309 Allergic rhinitis, unspecified: Secondary | ICD-10-CM

## 2022-04-27 DIAGNOSIS — J4520 Mild intermittent asthma, uncomplicated: Secondary | ICD-10-CM

## 2022-04-27 DIAGNOSIS — E119 Type 2 diabetes mellitus without complications: Secondary | ICD-10-CM | POA: Diagnosis not present

## 2022-04-27 DIAGNOSIS — Z8249 Family history of ischemic heart disease and other diseases of the circulatory system: Secondary | ICD-10-CM

## 2022-04-27 DIAGNOSIS — L578 Other skin changes due to chronic exposure to nonionizing radiation: Secondary | ICD-10-CM | POA: Diagnosis not present

## 2022-04-27 DIAGNOSIS — J329 Chronic sinusitis, unspecified: Secondary | ICD-10-CM

## 2022-04-27 MED ORDER — METFORMIN HCL 1000 MG PO TABS: ORAL_TABLET | ORAL | 1 refills | Status: DC

## 2022-04-27 MED ORDER — SEMAGLUTIDE(0.25 OR 0.5MG/DOS) 2 MG/3ML ~~LOC~~ SOPN: 0.2500 mg | PEN_INJECTOR | SUBCUTANEOUS | 0 refills | Status: DC

## 2022-04-27 MED ORDER — PANTOPRAZOLE SODIUM 40 MG PO TBEC: 40.0000 mg | DELAYED_RELEASE_TABLET | Freq: Every day | ORAL | 11 refills | Status: DC

## 2022-04-27 MED ORDER — PREDNISONE 50 MG PO TABS: ORAL_TABLET | ORAL | 0 refills | Status: DC

## 2022-04-27 MED ORDER — FLUCONAZOLE 150 MG PO TABS: ORAL_TABLET | ORAL | 0 refills | Status: DC

## 2022-04-27 MED ORDER — AZELASTINE HCL 0.1 % NA SOLN: 2.0000 | Freq: Two times a day (BID) | NASAL | 12 refills | Status: DC

## 2022-04-27 MED ORDER — AMOXICILLIN-POT CLAVULANATE 875-125 MG PO TABS: 1.0000 | ORAL_TABLET | Freq: Two times a day (BID) | ORAL | 0 refills | Status: DC

## 2022-04-27 NOTE — Assessment & Plan Note (Signed)
Asthma overall seems to be stable.  No wheezing.  No shortness of breath.  Has albuterol to use as needed.  Does not need refill today.

## 2022-04-27 NOTE — Assessment & Plan Note (Signed)
Symptoms are not controlled.  Will be restarting Astelin as above.  Symptoms have been progressively worsening for several years.  Will place referral to allergy for further testing per patient request.

## 2022-04-27 NOTE — Assessment & Plan Note (Signed)
Recently saw endocrinology.  A1c was not controlled at 7.6.  Insurance did not pay for [employer].  She would like to go back to Ozempic.  Will refill today.  Start 2.5 mg weekly 4 weeks and then increase to 0.5 mg weekly.  She will send me a message in a few weeks and we can titrate the dose as needed.  Will refill her metformin 1000 mg twice daily.

## 2022-04-27 NOTE — Progress Notes (Signed)
   Page Heidi Holland is a 47 y.o. female who presents today for a virtual office visit.  Assessment/Plan:  New/Acute Problems: Sinusitis  No red flags.  Given length of symptoms would be reasonable to start antibiotics.  Will start Augmentin.  She has done well with prednisone in the past and request this today as well.  Will send in prescription for this.  Also restart Astelin nasal spray.  Will give a pocket prescription for Diflucan in case she develops yeast infection.  She can continue using over-the-counter meds.  Encouraged hydration.  We discussed reasons to return to care.  Chronic Problems Addressed Today: Allergic rhinitis Symptoms are not controlled.  Will be restarting Astelin as above.  Symptoms have been progressively worsening for several years.  Will place referral to allergy for further testing per patient request.  Family history of early CAD She has several personal risk factors including dyslipidemia and diabetes.  Will place referral to cardiology per patient request.  Mild intermittent asthma, uncomplicated Asthma overall seems to be stable.  No wheezing.  No shortness of breath.  Has albuterol to use as needed.  Does not need refill today.  T2DM (type 2 diabetes mellitus) [facility]) Recently saw endocrinology.  A1c was not controlled at 7.6.  Insurance did not pay for [employer].  She would like to go back to Ozempic.  Will refill today.  Start 2.5 mg weekly 4 weeks and then increase to 0.5 mg weekly.  She will send me a message in a few weeks and we can titrate the dose as needed.  Will refill her metformin 1000 mg twice daily.  GERD (gastroesophageal reflux disease) Follows with GI.  Continue Protonix 40 mg daily.  Sun-damaged skin Will place referral to dermatology per patient request.    Subjective:  HPI:  See A/p for status of chronic conditions.  Her main concern today is sinus congestion. This has been going on for a few weeks. Some sore throat. Some cough. Some  fevers.  Some facial pain and pressure.  Consistent with previous sinus infections.  Sister has been sick with similar symptoms.       Objective/Observations  Physical Exam: Gen: NAD, resting comfortably Pulm: Normal work of breathing Neuro: Grossly normal, moves all extremities Psych: Normal affect and thought content  Virtual Visit via Video   I connected with Sophea Hamaker on 04/27/22 at  8:20 AM EDT by a video enabled telemedicine application and verified that I am speaking with the correct person using two identifiers. The limitations of evaluation and management by telemedicine and the availability of in person appointments were discussed. The patient expressed understanding and agreed to proceed.   Patient location: Home Provider location: [place] participating in the virtual visit: Myself and Patient     Algis Greenhouse. Jerline Pain, MD 04/27/2022 9:06 AM

## 2022-04-27 NOTE — Telephone Encounter (Signed)
INS NOT COVER.per pharmacy

## 2022-04-27 NOTE — Assessment & Plan Note (Signed)
Follows with GI.  Continue Protonix 40 mg daily.

## 2022-04-27 NOTE — Assessment & Plan Note (Signed)
She has several personal risk factors including dyslipidemia and diabetes.  Will place referral to cardiology per patient request.

## 2022-04-30 ENCOUNTER — Telehealth: Payer: Self-pay | Admitting: Family Medicine

## 2022-04-30 NOTE — Telephone Encounter (Signed)
Please advise 

## 2022-04-30 NOTE — Telephone Encounter (Signed)
Patient states she got antibiotics following VV on 3/15 with PCP and now has yeast infection vaginally and in mouth. Requests pcp send in Nystatin mouthwash to treat oral yeast infection. States already has vaginal medication.

## 2022-05-01 NOTE — Telephone Encounter (Signed)
Patient notified Rx will treat oral yeast infection as well.

## 2022-05-01 NOTE — Telephone Encounter (Signed)
Has she taken the diflucan? This well treat oral yeast infection as well.  Algis Greenhouse. Jerline Pain, MD 05/01/2022 12:56 PM

## 2022-05-03 DIAGNOSIS — F4312 Post-traumatic stress disorder, chronic: Secondary | ICD-10-CM | POA: Diagnosis not present

## 2022-05-10 DIAGNOSIS — F4312 Post-traumatic stress disorder, chronic: Secondary | ICD-10-CM | POA: Diagnosis not present

## 2022-05-14 ENCOUNTER — Encounter: Payer: Self-pay | Admitting: Gastroenterology

## 2022-05-15 ENCOUNTER — Encounter: Payer: 59 | Admitting: Gastroenterology

## 2022-05-15 DIAGNOSIS — F411 Generalized anxiety disorder: Secondary | ICD-10-CM | POA: Diagnosis not present

## 2022-05-15 DIAGNOSIS — F3162 Bipolar disorder, current episode mixed, moderate: Secondary | ICD-10-CM | POA: Diagnosis not present

## 2022-05-15 DIAGNOSIS — F410 Panic disorder [episodic paroxysmal anxiety] without agoraphobia: Secondary | ICD-10-CM | POA: Diagnosis not present

## 2022-05-15 DIAGNOSIS — F4312 Post-traumatic stress disorder, chronic: Secondary | ICD-10-CM | POA: Diagnosis not present

## 2022-05-15 DIAGNOSIS — F5102 Adjustment insomnia: Secondary | ICD-10-CM | POA: Diagnosis not present

## 2022-05-17 DIAGNOSIS — F4312 Post-traumatic stress disorder, chronic: Secondary | ICD-10-CM | POA: Diagnosis not present

## 2022-05-20 ENCOUNTER — Telehealth: Payer: 59 | Admitting: Family Medicine

## 2022-05-20 DIAGNOSIS — J309 Allergic rhinitis, unspecified: Secondary | ICD-10-CM | POA: Diagnosis not present

## 2022-05-20 DIAGNOSIS — B3731 Acute candidiasis of vulva and vagina: Secondary | ICD-10-CM | POA: Diagnosis not present

## 2022-05-20 DIAGNOSIS — J019 Acute sinusitis, unspecified: Secondary | ICD-10-CM | POA: Diagnosis not present

## 2022-05-20 DIAGNOSIS — B370 Candidal stomatitis: Secondary | ICD-10-CM

## 2022-05-20 DIAGNOSIS — B9689 Other specified bacterial agents as the cause of diseases classified elsewhere: Secondary | ICD-10-CM | POA: Diagnosis not present

## 2022-05-20 MED ORDER — NYSTATIN 100000 UNIT/ML MT SUSP: 5.0000 mL | Freq: Four times a day (QID) | OROMUCOSAL | 0 refills | Status: AC

## 2022-05-20 MED ORDER — FLUCONAZOLE 150 MG PO TABS: 150.0000 mg | ORAL_TABLET | Freq: Once | ORAL | 0 refills | Status: AC

## 2022-05-20 MED ORDER — DOXYCYCLINE HYCLATE 100 MG PO TABS: 100.0000 mg | ORAL_TABLET | Freq: Two times a day (BID) | ORAL | 0 refills | Status: AC

## 2022-05-20 MED ORDER — PREDNISONE 10 MG (21) PO TBPK: ORAL_TABLET | ORAL | 0 refills | Status: AC

## 2022-05-20 NOTE — Patient Instructions (Signed)

## 2022-05-20 NOTE — Progress Notes (Signed)
Virtual Visit Consent   Heidi Holland, you are scheduled for a virtual visit with a [facility] provider today. Just as with appointments in the office, your consent must be obtained to participate. Your consent will be active for this visit and any virtual visit you may have with one of our providers in the next 365 days. If you have a MyChart account, a copy of this consent can be sent to you electronically.  As this is a virtual visit, video technology does not allow for your provider to perform a traditional examination. This may limit your provider's ability to fully assess your condition. If your provider identifies any concerns that need to be evaluated in person or the need to arrange testing (such as labs, EKG, etc.), we will make arrangements to do so. Although advances in technology are sophisticated, we cannot ensure that it will always work on either your end or our end. If the connection with a video visit is poor, the visit may have to be switched to a telephone visit. With either a video or telephone visit, we are not always able to ensure that we have a secure connection.  By engaging in this virtual visit, you consent to the provision of healthcare and authorize for your insurance to be billed (if applicable) for the services provided during this visit. Depending on your insurance coverage, you may receive a charge related to this service.  I need to obtain your verbal consent now. Are you willing to proceed with your visit today? Heidi Holland has provided verbal consent on 05/20/2022 for a virtual visit (video or telephone). Heidi Curio, FNP  Date: 05/20/2022 10:50 AM  Virtual Visit via Video Note   I, Heidi Holland, connected with  Heidi Holland  ([id number], 28-Jul-1975) on 05/20/22 at 10:45 AM EDT by a video-enabled telemedicine application and verified that I am speaking with the correct person using two identifiers.  Location: Patient: Virtual Visit Location Patient: Home Provider:  Virtual Visit Location Provider: Home Office   I discussed the limitations of evaluation and management by telemedicine and the availability of in person appointments. The patient expressed understanding and agreed to proceed.    History of Present Illness: Heidi Holland is a 47 y.o. who identifies as a female who was assigned female at birth, and is being seen today for sinus pressure and pain with post nasal drainage. Treated with augmentin a month ago and sx have returned. Has an apptmt with allergist. She also says she has a yeast infection and thrush from augmentin. She also says she normally needs prednisone to resolve problems and didn't take that a month ago.   HPI: HPI  Problems:  Patient Active Problem List   Diagnosis Date Noted   Paresthesia 09/13/2021   CIN I (cervical intraepithelial neoplasia I) 03/03/2021   LSIL pap smear of cervix/human papillomavirus (HPV) positive 10/24/2020   Family history of early CAD 10/24/2020   B12 deficiency 07/26/2020   Somatic dysfunction of spine, cervical 07/21/2020   Status post left hip replacement 03/12/2019   T2DM (type 2 diabetes mellitus) 07/31/2018   Dyslipidemia associated with type 2 diabetes mellitus 07/31/2018   Allergic rhinitis 07/31/2018   Mild intermittent asthma, uncomplicated 07/31/2018   GERD (gastroesophageal reflux disease) 07/31/2018   S/P gastric bypass 07/31/2018   Migraine 07/31/2018   Anxiety 07/31/2018   Depression, major, single episode, complete remission 07/31/2018   Insomnia 07/31/2018   Umbilical hernia 07/31/2018   Iron deficiency anemia 06/09/2018   Cervicogenic  headache 05/12/2018   Low back pain 05/12/2018   Polyarthralgia 05/12/2018    Allergies:  Allergies  Allergen Reactions   Ciprofloxacin Itching   Medications:  Current Outpatient Medications:    doxycycline (VIBRA-TABS) 100 MG tablet, Take 1 tablet (100 mg total) by mouth 2 (two) times daily for 10 days., Disp: 20 tablet, Rfl: 0    fluconazole (DIFLUCAN) 150 MG tablet, Take 1 tablet (150 mg total) by mouth once for 1 dose., Disp: 1 tablet, Rfl: 0   nystatin (MYCOSTATIN) 100000 UNIT/ML suspension, Take 5 mLs (500,000 Units total) by mouth 4 (four) times daily for 10 days., Disp: 200 mL, Rfl: 0   predniSONE (STERAPRED UNI-PAK 21 TAB) 10 MG (21) TBPK tablet, Prednisone 10 mg- 12 day dose pack as directed- taper, Disp: 1 each, Rfl: 0   albuterol (VENTOLIN HFA) 108 (90 Base) MCG/ACT inhaler, INHALE 2 PUFFS BY MOUTH EVERY 6 HOURS AS NEEDED FOR WHEEZE OR SHORTNESS OF BREATH, Disp: 8.5 each, Rfl: 2   amoxicillin-clavulanate (AUGMENTIN) 875-125 MG tablet, Take 1 tablet by mouth 2 (two) times daily., Disp: 20 tablet, Rfl: 0   azelastine (ASTELIN) 0.1 % nasal spray, Place 2 sprays into both nostrils 2 (two) times daily., Disp: 30 mL, Rfl: 12   blood glucose meter kit and supplies KIT, Dispense based on patient and insurance preference. Use up to four times daily as directed. (FOR ICD-9 250.00, 250.01)., Disp: 1 each, Rfl: 0   buPROPion (WELLBUTRIN SR) 150 MG 12 hr tablet, Take 150 mg by mouth daily., Disp: , Rfl:    clonazePAM (KLONOPIN) 0.5 MG tablet, Take 1 tablet (0.5 mg total) by mouth 2 (two) times daily as needed. for anxiety, Disp: 60 tablet, Rfl: 0   Continuous Blood Gluc Receiver (FREESTYLE LIBRE READER) DEVI, 1 Device by Does not apply route daily as needed (to check blood sugar)., Disp: 1 each, Rfl: 0   Continuous Blood Gluc Sensor (FREESTYLE LIBRE SENSOR SYSTEM) MISC, Apply 1 sensor to skin every 14 days., Disp: 2 each, Rfl: 11   Dulaglutide (TRULICITY) 0.75 MG/0.5ML SOPN, Inject 0.75 mg into the skin once a week., Disp: 2 mL, Rfl: 0   fluconazole (DIFLUCAN) 150 MG tablet, Take every 3 days as needed., Disp: 2 tablet, Rfl: 0   lumateperone tosylate (CAPLYTA) 42 MG capsule, , Disp: , Rfl:    meclizine (ANTIVERT) 25 MG tablet, Take 1 tablet (25 mg total) by mouth 3 (three) times daily as needed for dizziness., Disp: 30 tablet,  Rfl: 0   metFORMIN (GLUCOPHAGE) 1000 MG tablet, TAKE 1 TABLET BY MOUTH TWICE A DAY WITH A MEAL, Disp: 180 tablet, Rfl: 1   ondansetron (ZOFRAN-ODT) 4 MG disintegrating tablet, Take 1 tablet (4 mg total) by mouth every 6 (six) hours as needed for nausea or vomiting., Disp: 30 tablet, Rfl: 5   pantoprazole (PROTONIX) 40 MG tablet, Take 1 tablet (40 mg total) by mouth daily. TAKE 1 TABLET BY MOUTH 30-60 minutes before breakfast, Disp: 30 tablet, Rfl: 11   predniSONE (DELTASONE) 50 MG tablet, Take 1 tablet daily for 5 days., Disp: 5 tablet, Rfl: 0   pregabalin (LYRICA) 150 MG capsule, TAKE 1 CAPSULE BY MOUTH TWICE A DAY, Disp: 60 capsule, Rfl: 5   QUEtiapine (SEROQUEL) 100 MG tablet, Take by mouth., Disp: , Rfl:    rosuvastatin (CRESTOR) 20 MG tablet, Take 1 tablet (20 mg total) by mouth daily., Disp: 90 tablet, Rfl: 3   sucralfate (CARAFATE) 1 g tablet, Take 1 tablet (1 g total)  by mouth 4 (four) times daily -  with meals and at bedtime., Disp: 60 tablet, Rfl: 1   SUMAtriptan (IMITREX) 25 MG tablet, Take 1 tablet (25 mg total) by mouth every 2 (two) hours as needed for migraine. May repeat in 2 hours if headache persists or recurs., Disp: 10 tablet, Rfl: 1   tiZANidine (ZANAFLEX) 4 MG tablet, TAKE 1 TABLET BY MOUTH 3 TIMES DAILY., Disp: 270 tablet, Rfl: 1   zolpidem (AMBIEN) 5 MG tablet, Take 1 tablet (5 mg total) by mouth at bedtime as needed for sleep., Disp: 30 tablet, Rfl: 5   zonisamide (ZONEGRAN) 100 MG capsule, TAKE ONE CAPSULE BY MOUTH ONE TIME DAILY, Disp: 30 capsule, Rfl: 5  Observations/Objective: Patient is well-developed, well-nourished in no acute distress.  Resting comfortably  at home.  Head is normocephalic, atraumatic.  No labored breathing.  Speech is clear and coherent with logical content.  Patient is alert and oriented at baseline.    Assessment and Plan: 1. Acute bacterial sinusitis  2. Allergic rhinitis, unspecified seasonality, unspecified trigger  3. Candidiasis,  vagina  4. Thrush  Increase fluids, continue allergy regimen, med use discussed,m UC if sx worsen.   Follow Up Instructions: I discussed the assessment and treatment plan with the patient. The patient was provided an opportunity to ask questions and all were answered. The patient agreed with the plan and demonstrated an understanding of the instructions.  A copy of instructions were sent to the patient via MyChart unless otherwise noted below.     The patient was advised to call back or seek an in-person evaluation if the symptoms worsen or if the condition fails to improve as anticipated.  Time:  I spent 10 minutes with the patient via telehealth technology discussing the above problems/concerns.    Heidi Curio, FNP

## 2022-05-21 ENCOUNTER — Telehealth: Payer: Self-pay | Admitting: Gastroenterology

## 2022-05-21 NOTE — Telephone Encounter (Signed)
Patient returned call

## 2022-05-21 NOTE — Telephone Encounter (Signed)
Inbound call from patient, states she has a yeast infection in her mouth and has been prescribed antibiotics that she is supposed to start taking today. Her  Endo Colon is scheduled for 4/10. She is requesting to speak with a nurse to see if this will affect her procedure in any way. Please advise.

## 2022-05-21 NOTE — Telephone Encounter (Signed)
Left message on machine to call back  

## 2022-05-22 NOTE — Telephone Encounter (Signed)
The pt has thrush and is being treated with diflucan and wants to confirm that she can have procedure as planned.  She has been advised that she can keep appt as planned.

## 2022-05-23 ENCOUNTER — Encounter: Payer: Self-pay | Admitting: Gastroenterology

## 2022-05-23 ENCOUNTER — Ambulatory Visit (AMBULATORY_SURGERY_CENTER): Payer: 59 | Admitting: Gastroenterology

## 2022-05-23 VITALS — BP 125/68 | HR 65 | Temp 97.1°F | Resp 14 | Ht 63.0 in | Wt 186.0 lb

## 2022-05-23 DIAGNOSIS — K319 Disease of stomach and duodenum, unspecified: Secondary | ICD-10-CM | POA: Diagnosis not present

## 2022-05-23 DIAGNOSIS — K219 Gastro-esophageal reflux disease without esophagitis: Secondary | ICD-10-CM | POA: Diagnosis not present

## 2022-05-23 DIAGNOSIS — F419 Anxiety disorder, unspecified: Secondary | ICD-10-CM | POA: Diagnosis not present

## 2022-05-23 DIAGNOSIS — E119 Type 2 diabetes mellitus without complications: Secondary | ICD-10-CM | POA: Diagnosis not present

## 2022-05-23 DIAGNOSIS — Z1211 Encounter for screening for malignant neoplasm of colon: Secondary | ICD-10-CM

## 2022-05-23 DIAGNOSIS — D124 Benign neoplasm of descending colon: Secondary | ICD-10-CM

## 2022-05-23 DIAGNOSIS — K5900 Constipation, unspecified: Secondary | ICD-10-CM

## 2022-05-23 DIAGNOSIS — K635 Polyp of colon: Secondary | ICD-10-CM | POA: Diagnosis not present

## 2022-05-23 DIAGNOSIS — K2100 Gastro-esophageal reflux disease with esophagitis, without bleeding: Secondary | ICD-10-CM

## 2022-05-23 DIAGNOSIS — F32A Depression, unspecified: Secondary | ICD-10-CM | POA: Diagnosis not present

## 2022-05-23 DIAGNOSIS — J45909 Unspecified asthma, uncomplicated: Secondary | ICD-10-CM | POA: Diagnosis not present

## 2022-05-23 HISTORY — PX: COLONOSCOPY: SHX174

## 2022-05-23 HISTORY — PX: UPPER GI ENDOSCOPY: SHX6162

## 2022-05-23 MED ORDER — SODIUM CHLORIDE 0.9 % IV SOLN: 500.0000 mL | Freq: Once | INTRAVENOUS | Status: DC

## 2022-05-23 NOTE — Patient Instructions (Addendum)
Thank you for letting us take care of your  healthcare needs today. Please see handouts given to you on Esophagitis, Polyps and Diverticulosis.     YOU HAD AN ENDOSCOPIC PROCEDURE TODAY AT THE [facility]:   Refer to the procedure report that was given to you for any specific questions about what was found during the examination.  If the procedure report does not answer your questions, please call your gastroenterologist to clarify.  If you requested that your care partner not be given the details of your procedure findings, then the procedure report has been included in a sealed envelope for you to review at your convenience later.  YOU SHOULD EXPECT: Some feelings of bloating in the abdomen. Passage of more gas than usual.  Walking can help get rid of the air that was put into your GI tract during the procedure and reduce the bloating. If you had a lower endoscopy (such as a colonoscopy or flexible sigmoidoscopy) you may notice spotting of blood in your stool or on the toilet paper. If you underwent a bowel prep for your procedure, you may not have a normal bowel movement for a few days.  Please Note:  You might notice some irritation and congestion in your nose or some drainage.  This is from the oxygen used during your procedure.  There is no need for concern and it should clear up in a day or so.  SYMPTOMS TO REPORT IMMEDIATELY:  Following lower endoscopy (colonoscopy or flexible sigmoidoscopy):  Excessive amounts of blood in the stool  Significant tenderness or worsening of abdominal pains  Swelling of the abdomen that is new, acute  Fever of 100F or higher  Following upper endoscopy (EGD)  Vomiting of blood or coffee ground material  New chest pain or pain under the shoulder blades  Painful or persistently difficult swallowing  New shortness of breath  Fever of 100F or higher  Black, tarry-looking stools  For urgent or emergent issues, a gastroenterologist can be  reached at any hour by calling [phone]. Do not use MyChart messaging for urgent concerns.    DIET:  We do recommend a small meal at first, but then you may proceed to your regular diet.  Drink plenty of fluids but you should avoid alcoholic beverages for 24 hours.  ACTIVITY:  You should plan to take it easy for the rest of today and you should NOT DRIVE or use heavy machinery until tomorrow (because of the sedation medicines used during the test).    FOLLOW UP: Our staff will call the number listed on your records the next business day following your procedure.  We will call around 7:15- 8:00 am to check on you and address any questions or concerns that you may have regarding the information given to you following your procedure. If we do not reach you, we will leave a message.     If any biopsies were taken you will be contacted by phone or by letter within the next 1-3 weeks.  Please call us at [phone] if you have not heard about the biopsies in 3 weeks.    SIGNATURES/CONFIDENTIALITY: You and/or your care partner have signed paperwork which will be entered into your electronic medical record.  These signatures attest to the fact that that the information above on your After Visit Summary has been reviewed and is understood.  Full responsibility of the confidentiality of this discharge information lies with you and/or your care-partner.

## 2022-05-23 NOTE — Op Note (Signed)
[facility] Patient Name: Heidi AhmadiSarah Holland Procedure Date: 05/23/2022 7:23 AM MRN: [id number] Endoscopist: Tressia DanasKimberly Menna Abeln MD, MD, [phone] Age: 47 Referring MD:  Date of Birth: 03/04/1975 Gender: Female Account #: [id number] Procedure:                Colonoscopy Indications:              Screening for colorectal malignant neoplasm, This                            is the patient's first colonoscopy Medicines:                Monitored Anesthesia Care Procedure:                Pre-Anesthesia Assessment:                           - Prior to the procedure, a History and Physical                            was performed, and patient medications and                            allergies were reviewed. The patient's tolerance of                            previous anesthesia was also reviewed. The risks                            and benefits of the procedure and the sedation                            options and risks were discussed with the patient.                            All questions were answered, and informed consent                            was obtained. Prior Anticoagulants: The patient has                            taken no anticoagulant or antiplatelet agents. ASA                            Grade Assessment: II - A patient with mild systemic                            disease. After reviewing the risks and benefits,                            the patient was deemed in satisfactory condition to                            undergo the procedure.  After obtaining informed consent, the colonoscope                            was passed under direct vision. Throughout the                            procedure, the patient's blood pressure, pulse, and                            oxygen saturations were monitored continuously. The                            CF [id number] #[id number] was introduced through the anus                            and advanced to the  3 cm into the ileum. The                            colonoscopy was technically difficult and complex                            due to a redundant colon. Successful completion of                            the procedure was aided by withdrawing and                            reinserting the scope. The patient tolerated the                            procedure well. The quality of the bowel                            preparation was excellent. The terminal ileum,                            ileocecal valve, appendiceal orifice, and rectum                            were photographed. Scope In: 8:30:16 AM Scope Out: 8:49:20 AM Scope Withdrawal Time: 0 hours 13 minutes 45 seconds  Total Procedure Duration: 0 hours 19 minutes 4 seconds  Findings:                 The perianal and digital rectal examinations were                            normal.                           Many medium-mouthed and small-mouthed diverticula                            were found in the sigmoid colon and ascending colon.  There was evidence of a prior end-to-end                            colo-colonic anastomosis in the sigmoid colon. This                            was patent and was characterized by healthy                            appearing mucosa.                           A 2 mm polyp was found in the descending colon. The                            polyp was sessile. The polyp was removed with a                            cold snare. Resection and retrieval were complete.                            Estimated blood loss was minimal.                           The exam was otherwise without abnormality on                            direct and retroflexion views. Complications:            No immediate complications. Estimated Blood Loss:     Estimated blood loss was minimal. Impression:               - Diverticulosis in the sigmoid colon and in the                            ascending  colon.                           - Patent end-to-end colo-colonic anastomosis,                            characterized by healthy appearing mucosa.                           - One 2 mm polyp in the descending colon, removed                            with a cold snare. Resected and retrieved.                           - The examination was otherwise normal on direct                            and retroflexion views. Recommendation:           - Patient has a  contact number available for                            emergencies. The signs and symptoms of potential                            delayed complications were discussed with the                            patient. Return to normal activities tomorrow.                            Written discharge instructions were provided to the                            patient.                           - Continue present medications.                           - Await pathology results.                           - Repeat colonoscopy date to be determined after                            pending pathology results are reviewed for                            surveillance.                           - Follow a high fiber diet. Drink at least 64                            ounces of water daily. Add a daily stool bulking                            agent such as psyllium (an exampled would be                            Metamucil).                           - Limit the use of NSAIDs and COX-2 inhibitors.                           - Emerging evidence supports eating a diet of                            fruits, vegetables, grains, calcium, and yogurt                            while reducing red meat and alcohol may reduce the  risk of colon cancer.                           - Thank you for allowing me to be involved in your                            colon cancer prevention. Tressia Danas MD, MD 05/23/2022 9:05:21 AM This  report has been signed electronically.

## 2022-05-23 NOTE — Progress Notes (Signed)
Referring Provider: Ardith Dark, MD Primary Care Physician:  Ardith Dark, MD   Indication for EGD: Nausea and vomiting, bloating Indication for Colonoscopy:  Colon cancer screening   IMPRESSION:  Nausea and vomiting Bloating Family history of celiac (sister) Need for colon cancer screening Appropriate candidate for monitored anesthesia care  PLAN: EGD and colonoscopy in the [facility] today   HPI: Heidi Holland is a 47 y.o. female presents for screening colonoscopy and diagnostic upper endoscopy to evaluate nausea, vomiting, and bloating.  Patient has longstanding reflux with intermittent abdominal pain and distention with progressive escalation in symptoms over the last 4 to 5 months.  In fact she is required multiple ER visits.  CBC CMP and lipase have been normal.  CT of the abdomen and pelvis with contrast and right upper quadrant ultrasound showed no changes for her symptoms.  There is a family history of celiac disease.  There is no known family history of colon cancer or polyps.  Past Medical History:  Diagnosis Date   Allergy    Anxiety    Asthma    Depression    Diabetes mellitus without complication    Diverticulitis    GERD (gastroesophageal reflux disease)    HPV in female    Hyperlipidemia    Migraines    Vertigo     Past Surgical History:  Procedure Laterality Date   BOWEL RESECTION     gasticbypass     GASTRIC BYPASS     KNEE ARTHROSCOPY     TOTAL HIP ARTHROPLASTY Left     Current Outpatient Medications  Medication Sig Dispense Refill   albuterol (VENTOLIN HFA) 108 (90 Base) MCG/ACT inhaler INHALE 2 PUFFS BY MOUTH EVERY 6 HOURS AS NEEDED FOR WHEEZE OR SHORTNESS OF BREATH 8.5 each 2   AUVELITY 45-105 MG TBCR Take by mouth.     azelastine (ASTELIN) 0.1 % nasal spray Place 2 sprays into both nostrils 2 (two) times daily. 30 mL 12   blood glucose meter kit and supplies KIT Dispense based on patient and insurance preference. Use up to four times  daily as directed. (FOR ICD-9 250.00, 250.01). 1 each 0   clonazePAM (KLONOPIN) 0.5 MG tablet Take 1 tablet (0.5 mg total) by mouth 2 (two) times daily as needed. for anxiety 60 tablet 0   Continuous Blood Gluc Receiver (FREESTYLE LIBRE READER) DEVI 1 Device by Does not apply route daily as needed (to check blood sugar). 1 each 0   Continuous Blood Gluc Sensor (FREESTYLE LIBRE SENSOR SYSTEM) MISC Apply 1 sensor to skin every 14 days. 2 each 11   HYDROcodone-acetaminophen (NORCO) 10-325 MG tablet Take 1 tablet by mouth every 6 (six) hours as needed.     lumateperone tosylate (CAPLYTA) 42 MG capsule      MAGNESIUM PO Take by mouth.     metFORMIN (GLUCOPHAGE) 1000 MG tablet TAKE 1 TABLET BY MOUTH TWICE A DAY WITH A MEAL 180 tablet 1   pantoprazole (PROTONIX) 40 MG tablet Take 1 tablet (40 mg total) by mouth daily. TAKE 1 TABLET BY MOUTH 30-60 minutes before breakfast 30 tablet 11   pregabalin (LYRICA) 150 MG capsule TAKE 1 CAPSULE BY MOUTH TWICE A DAY 60 capsule 5   QUEtiapine (SEROQUEL) 100 MG tablet Take by mouth.     tiZANidine (ZANAFLEX) 4 MG tablet TAKE 1 TABLET BY MOUTH 3 TIMES DAILY. 270 tablet 1   zolpidem (AMBIEN) 5 MG tablet Take 1 tablet (5 mg total) by mouth at  bedtime as needed for sleep. 30 tablet 5   zonisamide (ZONEGRAN) 100 MG capsule TAKE ONE CAPSULE BY MOUTH ONE TIME DAILY 30 capsule 5   amoxicillin-clavulanate (AUGMENTIN) 875-125 MG tablet Take 1 tablet by mouth 2 (two) times daily. 20 tablet 0   buPROPion (WELLBUTRIN SR) 150 MG 12 hr tablet Take 150 mg by mouth daily.     doxycycline (VIBRA-TABS) 100 MG tablet Take 1 tablet (100 mg total) by mouth 2 (two) times daily for 10 days. 20 tablet 0   Dulaglutide (TRULICITY) 0.75 MG/0.5ML SOPN Inject 0.75 mg into the skin once a week. 2 mL 0   fluconazole (DIFLUCAN) 150 MG tablet Take every 3 days as needed. 2 tablet 0   meclizine (ANTIVERT) 25 MG tablet Take 1 tablet (25 mg total) by mouth 3 (three) times daily as needed for dizziness.  30 tablet 0   nystatin (MYCOSTATIN) 100000 UNIT/ML suspension Take 5 mLs (500,000 Units total) by mouth 4 (four) times daily for 10 days. 200 mL 0   ondansetron (ZOFRAN-ODT) 4 MG disintegrating tablet Take 1 tablet (4 mg total) by mouth every 6 (six) hours as needed for nausea or vomiting. 30 tablet 5   predniSONE (DELTASONE) 50 MG tablet Take 1 tablet daily for 5 days. 5 tablet 0   predniSONE (STERAPRED UNI-PAK 21 TAB) 10 MG (21) TBPK tablet Prednisone 10 mg- 12 day dose pack as directed- taper 1 each 0   rosuvastatin (CRESTOR) 20 MG tablet Take 1 tablet (20 mg total) by mouth daily. 90 tablet 3   sucralfate (CARAFATE) 1 g tablet Take 1 tablet (1 g total) by mouth 4 (four) times daily -  with meals and at bedtime. 60 tablet 1   SUMAtriptan (IMITREX) 25 MG tablet Take 1 tablet (25 mg total) by mouth every 2 (two) hours as needed for migraine. May repeat in 2 hours if headache persists or recurs. 10 tablet 1   Current Facility-Administered Medications  Medication Dose Route Frequency Provider Last Rate Last Admin   0.9 %  sodium chloride infusion  500 mL Intravenous Once Tressia Danas, MD        Allergies as of 05/23/2022 - Review Complete 05/23/2022  Allergen Reaction Noted   Ciprofloxacin Itching 11/02/2017    Family History  Problem Relation Age of Onset   Heart disease Mother    Lung cancer Mother    Rheum arthritis Mother    Arthritis Mother    Cancer Mother    COPD Mother    Miscarriages / [place] Mother    Diabetes Father    Heart disease Father    Breast cancer Paternal Aunt      Physical Exam: General:   Alert,  well-nourished, pleasant and cooperative in NAD Head:  Normocephalic and atraumatic. Eyes:  Sclera clear, no icterus.   Conjunctiva pink. Mouth:  No deformity or lesions.   Neck:  Supple; no masses or thyromegaly. Lungs:  Clear throughout to auscultation.   No wheezes. Heart:  Regular rate and rhythm; no murmurs. Abdomen:  Soft, non-tender,  nondistended, normal bowel sounds, no rebound or guarding.  Msk:  Symmetrical. No boney deformities LAD: No inguinal or umbilical LAD Extremities:  No clubbing or edema. Neurologic:  Alert and  oriented x4;  grossly nonfocal Skin:  No obvious rash or bruise. Psych:  Alert and cooperative. Normal mood and affect.     Studies/Results: No results found.    Mliss Wedin L. Orvan Falconer, MD, MPH 05/23/2022, 8:08 AM

## 2022-05-23 NOTE — Progress Notes (Signed)
Called to room to assist during endoscopic procedure.  Patient ID and intended procedure confirmed with present staff. Received instructions for my participation in the procedure from the performing physician.  

## 2022-05-23 NOTE — Progress Notes (Signed)
Uneventful anesthetic. Report to pacu rn. Vss. Care resumed by rn. 

## 2022-05-23 NOTE — Progress Notes (Signed)
VS completed by DT.  Pt's states no medical or surgical changes since previsit or office visit.  

## 2022-05-23 NOTE — Op Note (Signed)
[facility] Patient Name: Heidi Holland Procedure Date: 05/23/2022 7:23 AM MRN: [id number] Endoscopist: Tressia Danas MD, MD, [id number] Age: 47 Referring MD:  Date of Birth: 1975/03/18 Gender: Female Account #: [id number] Procedure:                Upper GI endoscopy Indications:              Abdominal bloating, Nausea with vomiting, Family                            history of celiac (sister) Medicines:                Monitored Anesthesia Care Procedure:                Pre-Anesthesia Assessment:                           - Prior to the procedure, a History and Physical                            was performed, and patient medications and                            allergies were reviewed. The patient's tolerance of                            previous anesthesia was also reviewed. The risks                            and benefits of the procedure and the sedation                            options and risks were discussed with the patient.                            All questions were answered, and informed consent                            was obtained. Prior Anticoagulants: The patient has                            taken no anticoagulant or antiplatelet agents. ASA                            Grade Assessment: II - A patient with mild systemic                            disease. After reviewing the risks and benefits,                            the patient was deemed in satisfactory condition to                            undergo the procedure.  After obtaining informed consent, the endoscope was                            passed under direct vision. Throughout the                            procedure, the patient's blood pressure, pulse, and                            oxygen saturations were monitored continuously. The                            GIF [id number] #[id number] was introduced through the                            mouth, and advanced to the  jejunum. The upper GI                            endoscopy was accomplished without difficulty. Scope In: Scope Out: Findings:                 LA Grade A (one or more mucosal breaks less than 5                            mm, not extending between tops of 2 mucosal folds)                            esophagitis with no bleeding was found 36 cm from                            the incisors. Biopsies were obtained from the                            mid/proximal and distal esophagus with cold forceps                            for histology of suspected eosinophilic esophagitis.                           Evidence of prior gastric surgery - suspected                            Billroth II anatomy - was found in the distal                            stomach. This was characterized by healthy                            appearing mucosa and visible sutures. The remaining                            gastric mucosa appeared normal. Biopsies were  obtained for histology.                           The examined jejunum was normal except for focal                            erythema. This was biopsied with a cold forceps for                            histology. Estimated blood loss was minimal. Complications:            No immediate complications. Estimated Blood Loss:     Estimated blood loss was minimal. Impression:               - LA Grade A esophagitis with no bleeding.                           - A Billroth II anastomosis was found,                            characterized by healthy appearing mucosa and                            visible sutures.                           - Normal gastric mucosa was biopsied.                           - Normal examined jejunum except for focal                            erythema. Biopsied.                           - Biopsies were taken with a cold forceps for                            evaluation of eosinophilic  esophagitis. Recommendation:           - Patient has a contact number available for                            emergencies. The signs and symptoms of potential                            delayed complications were discussed with the                            patient. Return to normal activities tomorrow.                            Written discharge instructions were provided to the                            patient.                           -  Resume previous diet.                           - Continue present medications.                           - Await pathology results.                           - No aspirin, ibuprofen, naproxen, or other                            non-steroidal anti-inflammatory drugs.                           - Office follow-up. Tressia DanasKimberly Ercie Eliasen MD, MD 05/23/2022 9:02:12 AM This report has been signed electronically.

## 2022-05-24 ENCOUNTER — Telehealth: Payer: Self-pay | Admitting: *Deleted

## 2022-05-24 DIAGNOSIS — F4312 Post-traumatic stress disorder, chronic: Secondary | ICD-10-CM | POA: Diagnosis not present

## 2022-05-24 NOTE — Telephone Encounter (Signed)
Follow up call attempt.  LVM to call if any questions or concerns. 

## 2022-05-29 ENCOUNTER — Encounter: Payer: Self-pay | Admitting: Gastroenterology

## 2022-05-29 NOTE — Progress Notes (Deleted)
  Cardiology Office Note:   Date:  05/29/2022  ID:  Heidi Holland, DOB 1975/07/21, MRN [id number]  History of Present Illness:   Heidi Holland is a 47 y.o. female anxiety, depression, HLD, and DMII who was referred by Dr. [name] for further evaluation of family history of CAD.  Today, ***  Past Medical History:  Diagnosis Date   Allergy    Anxiety    Asthma    Depression    Diabetes mellitus without complication    Diverticulitis    GERD (gastroesophageal reflux disease)    HPV in female    Hyperlipidemia    Migraines    Vertigo      ROS: ***  Studies Reviewed:    EKG:  ***       Risk Assessment/Calculations:   {Does this patient have ATRIAL FIBRILLATION?[phone]} No BP recorded.  {Refresh Note OR Click here to enter BP  :1}***        Physical Exam:   VS:  There were no vitals taken for this visit.   Wt Readings from Last 3 Encounters:  05/23/22 186 lb (84.4 kg)  04/27/22 191 lb (86.6 kg)  03/29/22 186 lb (84.4 kg)     GEN: Well nourished, well developed in no acute distress NECK: No JVD; No carotid bruits CARDIAC: ***RRR, no murmurs, rubs, gallops RESPIRATORY:  Clear to auscultation without rales, wheezing or rhonchi  ABDOMEN: Soft, non-tender, non-distended EXTREMITIES:  No edema; No deformity   ASSESSMENT AND PLAN:    #Family History of CAD: -Check Ca score  #HLD: -Continue crestor  daily  #DMII: -Continue metformin -Management per PCP    {Are you ordering a CV Procedure (e.g. stress test, cath, DCCV, TEE, etc)?   Press F2        :[id number]}   Signed, Meriam Sprague, MD

## 2022-05-30 ENCOUNTER — Telehealth: Payer: Self-pay | Admitting: *Deleted

## 2022-05-30 NOTE — Telephone Encounter (Signed)
Follow up appointment made for Hyacinth Meeker, PA on 07/26/22 @ 3 pm.

## 2022-05-31 ENCOUNTER — Ambulatory Visit: Payer: 59 | Admitting: Cardiology

## 2022-05-31 DIAGNOSIS — F4312 Post-traumatic stress disorder, chronic: Secondary | ICD-10-CM | POA: Diagnosis not present

## 2022-05-31 NOTE — Telephone Encounter (Signed)
The pt has been advised of the appt with Hyacinth Meeker to discuss ongoing symptoms

## 2022-05-31 NOTE — Telephone Encounter (Signed)
Inbound call from patent stating she is still experiencing bloating and nausea and requesting a call back from a nurse to discuss symptoms. Please advise.  Thank you

## 2022-06-11 DIAGNOSIS — F3162 Bipolar disorder, current episode mixed, moderate: Secondary | ICD-10-CM | POA: Diagnosis not present

## 2022-06-11 DIAGNOSIS — F410 Panic disorder [episodic paroxysmal anxiety] without agoraphobia: Secondary | ICD-10-CM | POA: Diagnosis not present

## 2022-06-11 DIAGNOSIS — F411 Generalized anxiety disorder: Secondary | ICD-10-CM | POA: Diagnosis not present

## 2022-06-11 DIAGNOSIS — F5102 Adjustment insomnia: Secondary | ICD-10-CM | POA: Diagnosis not present

## 2022-06-11 DIAGNOSIS — F4312 Post-traumatic stress disorder, chronic: Secondary | ICD-10-CM | POA: Diagnosis not present

## 2022-06-12 DIAGNOSIS — F3189 Other bipolar disorder: Secondary | ICD-10-CM | POA: Diagnosis not present

## 2022-06-21 DIAGNOSIS — F3189 Other bipolar disorder: Secondary | ICD-10-CM | POA: Diagnosis not present

## 2022-06-28 DIAGNOSIS — F3189 Other bipolar disorder: Secondary | ICD-10-CM | POA: Diagnosis not present

## 2022-06-29 DIAGNOSIS — J45909 Unspecified asthma, uncomplicated: Secondary | ICD-10-CM | POA: Diagnosis not present

## 2022-06-29 DIAGNOSIS — E1142 Type 2 diabetes mellitus with diabetic polyneuropathy: Secondary | ICD-10-CM | POA: Diagnosis not present

## 2022-06-29 DIAGNOSIS — M1990 Unspecified osteoarthritis, unspecified site: Secondary | ICD-10-CM | POA: Diagnosis not present

## 2022-06-29 DIAGNOSIS — I10 Essential (primary) hypertension: Secondary | ICD-10-CM | POA: Diagnosis not present

## 2022-06-29 DIAGNOSIS — K219 Gastro-esophageal reflux disease without esophagitis: Secondary | ICD-10-CM | POA: Diagnosis not present

## 2022-06-29 DIAGNOSIS — E1151 Type 2 diabetes mellitus with diabetic peripheral angiopathy without gangrene: Secondary | ICD-10-CM | POA: Diagnosis not present

## 2022-06-29 DIAGNOSIS — Z8249 Family history of ischemic heart disease and other diseases of the circulatory system: Secondary | ICD-10-CM | POA: Diagnosis not present

## 2022-06-29 DIAGNOSIS — Z833 Family history of diabetes mellitus: Secondary | ICD-10-CM | POA: Diagnosis not present

## 2022-06-29 DIAGNOSIS — R32 Unspecified urinary incontinence: Secondary | ICD-10-CM | POA: Diagnosis not present

## 2022-06-29 DIAGNOSIS — F319 Bipolar disorder, unspecified: Secondary | ICD-10-CM | POA: Diagnosis not present

## 2022-06-29 DIAGNOSIS — Z823 Family history of stroke: Secondary | ICD-10-CM | POA: Diagnosis not present

## 2022-06-29 DIAGNOSIS — Z7984 Long term (current) use of oral hypoglycemic drugs: Secondary | ICD-10-CM | POA: Diagnosis not present

## 2022-07-02 ENCOUNTER — Encounter: Payer: 59 | Admitting: Advanced Practice Midwife

## 2022-07-05 DIAGNOSIS — F3189 Other bipolar disorder: Secondary | ICD-10-CM | POA: Diagnosis not present

## 2022-07-13 ENCOUNTER — Ambulatory Visit: Payer: 59 | Admitting: Allergy

## 2022-07-17 ENCOUNTER — Encounter: Payer: Self-pay | Admitting: Cardiology

## 2022-07-17 ENCOUNTER — Ambulatory Visit: Payer: 59 | Admitting: Cardiology

## 2022-07-23 NOTE — Progress Notes (Unsigned)
po

## 2022-07-24 ENCOUNTER — Telehealth: Payer: Self-pay | Admitting: Physician Assistant

## 2022-07-24 ENCOUNTER — Ambulatory Visit: Payer: 59 | Admitting: Family Medicine

## 2022-07-24 ENCOUNTER — Other Ambulatory Visit: Payer: Self-pay | Admitting: Family Medicine

## 2022-07-24 VITALS — BP 136/80 | HR 78 | Temp 98.2°F | Ht 63.0 in | Wt 194.4 lb

## 2022-07-24 DIAGNOSIS — F5102 Adjustment insomnia: Secondary | ICD-10-CM | POA: Diagnosis not present

## 2022-07-24 DIAGNOSIS — Z13220 Encounter for screening for lipoid disorders: Secondary | ICD-10-CM | POA: Diagnosis not present

## 2022-07-24 DIAGNOSIS — M2550 Pain in unspecified joint: Secondary | ICD-10-CM

## 2022-07-24 DIAGNOSIS — Z7985 Long-term (current) use of injectable non-insulin antidiabetic drugs: Secondary | ICD-10-CM

## 2022-07-24 DIAGNOSIS — F410 Panic disorder [episodic paroxysmal anxiety] without agoraphobia: Secondary | ICD-10-CM | POA: Diagnosis not present

## 2022-07-24 DIAGNOSIS — E119 Type 2 diabetes mellitus without complications: Secondary | ICD-10-CM

## 2022-07-24 DIAGNOSIS — F3162 Bipolar disorder, current episode mixed, moderate: Secondary | ICD-10-CM | POA: Diagnosis not present

## 2022-07-24 DIAGNOSIS — F4312 Post-traumatic stress disorder, chronic: Secondary | ICD-10-CM | POA: Diagnosis not present

## 2022-07-24 DIAGNOSIS — F411 Generalized anxiety disorder: Secondary | ICD-10-CM | POA: Diagnosis not present

## 2022-07-24 LAB — CBC
HCT: 35.6 % — ABNORMAL LOW (ref 36.0–46.0)
Hemoglobin: 11.3 g/dL — ABNORMAL LOW (ref 12.0–15.0)
MCHC: 31.6 g/dL (ref 30.0–36.0)
MCV: 78.1 fl (ref 78.0–100.0)
Platelets: 319 10*3/uL (ref 150.0–400.0)
RBC: 4.56 Mil/uL (ref 3.87–5.11)
RDW: 15.8 % — ABNORMAL HIGH (ref 11.5–15.5)
WBC: 8.3 10*3/uL (ref 4.0–10.5)

## 2022-07-24 LAB — COMPREHENSIVE METABOLIC PANEL
ALT: 9 U/L (ref 0–35)
AST: 11 U/L (ref 0–37)
Albumin: 4.2 g/dL (ref 3.5–5.2)
Alkaline Phosphatase: 54 U/L (ref 39–117)
BUN: 13 mg/dL (ref 6–23)
CO2: 26 mEq/L (ref 19–32)
Calcium: 9.1 mg/dL (ref 8.4–10.5)
Chloride: 106 mEq/L (ref 96–112)
Creatinine, Ser: 0.65 mg/dL (ref 0.40–1.20)
GFR: 105.26 mL/min (ref >60.00)
Glucose, Bld: 107 mg/dL — ABNORMAL HIGH (ref 70–99)
Potassium: 4.1 mEq/L (ref 3.5–5.1)
Sodium: 138 mEq/L (ref 135–145)
Total Bilirubin: 0.3 mg/dL (ref 0.2–1.2)
Total Protein: 6.7 g/dL (ref 6.0–8.3)

## 2022-07-24 LAB — MICROALBUMIN / CREATININE URINE RATIO
Creatinine,U: 50.5 mg/dL
Microalb Creat Ratio: 1.4 mg/g (ref 0.0–30.0)
Microalb, Ur: <0.7 mg/dL (ref 0.0–1.9)

## 2022-07-24 LAB — POCT GLYCOSYLATED HEMOGLOBIN (HGB A1C): Hemoglobin A1C: 7 % — AB (ref 4.0–5.6)

## 2022-07-24 LAB — LIPID PANEL
Cholesterol: 176 mg/dL (ref 0–200)
HDL: 40.3 mg/dL (ref >39.00)
LDL Cholesterol: 116 mg/dL — ABNORMAL HIGH (ref 0–99)
NonHDL: 135.99
Total CHOL/HDL Ratio: 4
Triglycerides: 101 mg/dL (ref 0.0–149.0)
VLDL: 20.2 mg/dL (ref 0.0–40.0)

## 2022-07-24 LAB — SEDIMENTATION RATE: Sed Rate: 12 mm/hr (ref 0–20)

## 2022-07-24 LAB — HIGH SENSITIVITY CRP: CRP, High Sensitivity: 4.92 mg/L (ref 0.000–5.000)

## 2022-07-24 LAB — TSH: TSH: 0.44 u[IU]/mL (ref 0.35–5.50)

## 2022-07-24 MED ORDER — OZEMPIC (0.25 OR 0.5 MG/DOSE) 2 MG/3ML ~~LOC~~ SOPN: 0.2500 mg | PEN_INJECTOR | SUBCUTANEOUS | 5 refills | Status: DC

## 2022-07-24 MED ORDER — SUMATRIPTAN SUCCINATE 25 MG PO TABS: 25.0000 mg | ORAL_TABLET | ORAL | 1 refills | Status: DC | PRN

## 2022-07-24 NOTE — Telephone Encounter (Signed)
Please advise 

## 2022-07-24 NOTE — Assessment & Plan Note (Signed)
Longstanding issue.  Pain is still not controlled.  She does follow with Dr. Horald Chestnut at [facility] who is helping with pain control though she would like to do further evaluation to look for any other potential underlying causes.  Will check labs today.  She is also seen sports medicine for this in the past and is interested in seeing them again.  Will place referral today.

## 2022-07-24 NOTE — Patient Instructions (Addendum)
It was very nice to see you today!  Your A1c today is 7.0.  We will send in Ozempic.  You can also try the Trulicity if your insurance would not pay for the Ozempic.  We will check blood work and refer you to see the sports medicine doctor.  I will refill your migraine medications.  Return in about 3 months (around 10/24/2022) for Follow Up.   Take care, Dr [name]  PLEASE NOTE:  If you had any lab tests, please let us know if you have not heard back within a few days. You may see your results on mychart before we have a chance to review them but we will give you a call once they are reviewed by [place].   If we ordered any referrals today, please let us know if you have not heard from their office within the next week.   If you had any urgent prescriptions sent in today, please check with the pharmacy within an hour of our visit to make sure the prescription was transmitted appropriately.   Please try these tips to maintain a healthy lifestyle:  Eat at least 3 REAL meals and 1-2 snacks per day.  Aim for no more than 5 hours between eating.  If you eat breakfast, please do so within one hour of getting up.   Each meal should contain half fruits/vegetables, one quarter protein, and one quarter carbs (no bigger than a computer mouse)  Cut down on sweet beverages. This includes juice, soda, and sweet tea.   Drink at least 1 glass of water with each meal and aim for at least 8 glasses per day  Exercise at least 150 minutes every week.

## 2022-07-24 NOTE — Assessment & Plan Note (Signed)
A1c 7.0.  Could not get started on Mounjaro due to insurance.  We will try Ozempic but may need to go back with Trulicity.  Will recheck again in 3 months.  She is on metformin 1000 mg twice daily.  Tolerating well.

## 2022-07-24 NOTE — Telephone Encounter (Signed)
Inbound call from patient states she will not have transportation for appointment on 6/13. She is wondering if she could do a virtual visit. Requesting a call back to be advised. Thank you.

## 2022-07-25 LAB — ANA: Anti Nuclear Antibody (ANA): NEGATIVE

## 2022-07-25 NOTE — Telephone Encounter (Signed)
Pharmacy comment: Alternative Requested:NON FORMULARY.

## 2022-07-25 NOTE — Telephone Encounter (Signed)
Spoke with patient she is still having symptoms but not able to afford a ride. Will call back to reschedule.

## 2022-07-25 NOTE — Telephone Encounter (Signed)
Please send in Trulicity 0.75 mg weeky instead.  [name]. [name], MD 07/25/2022 10:34 AM

## 2022-07-26 ENCOUNTER — Ambulatory Visit: Payer: 59 | Admitting: Physician Assistant

## 2022-07-26 NOTE — Progress Notes (Signed)
Cholesterol level  is much better than last year.  She should keep working on diet and exercise and we can recheck this in a year or so.  She is slightly anemic but this is stable compared to values over the last year or so.  Would be a good idea for her to take an iron supplement if she is not doing so already.  Recommend ferrous sulfate 65 mg every other day.  We can recheck again in 3 to 6 months.  The rest of her labs are all normal.  No clear source for her pain.She should follow back up with sports medicine as we discussed at her office visit.

## 2022-08-06 NOTE — Progress Notes (Deleted)
Aleen Sells D.[facility] [address] [place] [address] Phone: [phone]   Assessment and Plan:     There are no diagnoses linked to this encounter.  ***   Pertinent previous records reviewed include ***   Follow Up: ***     Subjective:   I, Kewan Mcnease, am serving as a Neurosurgeon for Doctor Richardean Sale  Chief Complaint: multiple skeletal issues   HPI:   07/21/2020 Heidi Holland is a 47 y.o. female coming in with complaint of back pain. Seen for OMT in 2020. Patient states she is having similar pain as before. Back pain near the scapulas. States she is getting 2 injections in her back soon at [facility]. Numbness and tingling in the left leg. Patient states she gets migraines and gets Botox from neurology. Most of her pain is on the left side. History of hip replacement on the left side.    Onset-  Location - scapulas  Duration-  Character- sharp, throbbing  Aggravating factors- sitting, standing  Reliving factors- walking  Therapies tried- ice, heat  Severity- 10/10 at its worse has had to go to Urgent Care due to pain  Patient was last seen in 2020 and was responding well to manipulation.  08/13/2022 Patient states  Relevant Historical Information: ***  Additional pertinent review of systems negative.   Current Outpatient Medications:    albuterol (VENTOLIN HFA) 108 (90 Base) MCG/ACT inhaler, INHALE 2 PUFFS BY MOUTH EVERY 6 HOURS AS NEEDED FOR WHEEZE OR SHORTNESS OF BREATH, Disp: 8.5 each, Rfl: 2   AUVELITY 45-105 MG TBCR, Take by mouth., Disp: , Rfl:    azelastine (ASTELIN) 0.1 % nasal spray, Place 2 sprays into both nostrils 2 (two) times daily., Disp: 30 mL, Rfl: 12   blood glucose meter kit and supplies KIT, Dispense based on patient and insurance preference. Use up to four times daily as directed. (FOR ICD-9 250.00, 250.01)., Disp: 1 each, Rfl: 0   clonazePAM (KLONOPIN) 0.5 MG tablet, Take 1 tablet (0.5 mg  total) by mouth 2 (two) times daily as needed. for anxiety, Disp: 60 tablet, Rfl: 0   Continuous Blood Gluc Receiver (FREESTYLE LIBRE READER) DEVI, 1 Device by Does not apply route daily as needed (to check blood sugar)., Disp: 1 each, Rfl: 0   Continuous Blood Gluc Sensor (FREESTYLE LIBRE SENSOR SYSTEM) MISC, Apply 1 sensor to skin every 14 days., Disp: 2 each, Rfl: 11   HYDROcodone-acetaminophen (NORCO) 10-325 MG tablet, Take 1 tablet by mouth every 6 (six) hours as needed., Disp: , Rfl:    lumateperone tosylate (CAPLYTA) 42 MG capsule, , Disp: , Rfl:    MAGNESIUM PO, Take by mouth., Disp: , Rfl:    metFORMIN (GLUCOPHAGE) 1000 MG tablet, TAKE 1 TABLET BY MOUTH TWICE A DAY WITH A MEAL, Disp: 180 tablet, Rfl: 1   ondansetron (ZOFRAN-ODT) 4 MG disintegrating tablet, Take 1 tablet (4 mg total) by mouth every 6 (six) hours as needed for nausea or vomiting., Disp: 30 tablet, Rfl: 5   pantoprazole (PROTONIX) 40 MG tablet, Take 1 tablet (40 mg total) by mouth daily. TAKE 1 TABLET BY MOUTH 30-60 minutes before breakfast, Disp: 30 tablet, Rfl: 11   pregabalin (LYRICA) 150 MG capsule, TAKE 1 CAPSULE BY MOUTH TWICE A DAY, Disp: 60 capsule, Rfl: 5   QUEtiapine (SEROQUEL) 100 MG tablet, Take by mouth., Disp: , Rfl:    rosuvastatin (CRESTOR) 20 MG tablet, Take 1 tablet (20 mg total) by  mouth daily., Disp: 90 tablet, Rfl: 3   Semaglutide,0.25 or 0.5MG /DOS, (OZEMPIC, 0.25 OR 0.5 MG/DOSE,) 2 MG/3ML SOPN, Inject 0.25-0.5 mg into the skin once a week., Disp: 3 mL, Rfl: 5   SUMAtriptan (IMITREX) 25 MG tablet, Take 1 tablet (25 mg total) by mouth every 2 (two) hours as needed for migraine. May repeat in 2 hours if headache persists or recurs., Disp: 10 tablet, Rfl: 1   tiZANidine (ZANAFLEX) 4 MG tablet, TAKE 1 TABLET BY MOUTH 3 TIMES DAILY., Disp: 270 tablet, Rfl: 1   zolpidem (AMBIEN) 5 MG tablet, Take 1 tablet (5 mg total) by mouth at bedtime as needed for sleep., Disp: 30 tablet, Rfl: 5   zonisamide (ZONEGRAN) 100 MG  capsule, TAKE ONE CAPSULE BY MOUTH ONE TIME DAILY, Disp: 30 capsule, Rfl: 5   Objective:     There were no vitals filed for this visit.    There is no height or weight on file to calculate BMI.    Physical Exam:    ***   Electronically signed by:  Aleen Sells D.[facility] 7:25 AM 08/06/22

## 2022-08-08 ENCOUNTER — Ambulatory Visit: Payer: 59 | Admitting: Gastroenterology

## 2022-08-13 ENCOUNTER — Ambulatory Visit: Payer: 59 | Admitting: Sports Medicine

## 2022-08-22 DIAGNOSIS — F5102 Adjustment insomnia: Secondary | ICD-10-CM | POA: Diagnosis not present

## 2022-08-22 DIAGNOSIS — F4312 Post-traumatic stress disorder, chronic: Secondary | ICD-10-CM | POA: Diagnosis not present

## 2022-08-22 DIAGNOSIS — F411 Generalized anxiety disorder: Secondary | ICD-10-CM | POA: Diagnosis not present

## 2022-08-22 DIAGNOSIS — F410 Panic disorder [episodic paroxysmal anxiety] without agoraphobia: Secondary | ICD-10-CM | POA: Diagnosis not present

## 2022-08-22 DIAGNOSIS — F3162 Bipolar disorder, current episode mixed, moderate: Secondary | ICD-10-CM | POA: Diagnosis not present

## 2022-08-24 ENCOUNTER — Ambulatory Visit: Payer: 59 | Admitting: Physician Assistant

## 2022-08-24 ENCOUNTER — Encounter: Payer: Self-pay | Admitting: Physician Assistant

## 2022-08-24 VITALS — BP 150/100 | HR 76 | Temp 98.0°F | Ht 63.0 in | Wt 198.2 lb

## 2022-08-24 DIAGNOSIS — Z7985 Long-term (current) use of injectable non-insulin antidiabetic drugs: Secondary | ICD-10-CM | POA: Diagnosis not present

## 2022-08-24 DIAGNOSIS — M79641 Pain in right hand: Secondary | ICD-10-CM

## 2022-08-24 DIAGNOSIS — E119 Type 2 diabetes mellitus without complications: Secondary | ICD-10-CM | POA: Diagnosis not present

## 2022-08-24 DIAGNOSIS — M2550 Pain in unspecified joint: Secondary | ICD-10-CM

## 2022-08-24 DIAGNOSIS — N926 Irregular menstruation, unspecified: Secondary | ICD-10-CM

## 2022-08-24 DIAGNOSIS — R202 Paresthesia of skin: Secondary | ICD-10-CM | POA: Diagnosis not present

## 2022-08-24 DIAGNOSIS — M79642 Pain in left hand: Secondary | ICD-10-CM | POA: Diagnosis not present

## 2022-08-24 DIAGNOSIS — R21 Rash and other nonspecific skin eruption: Secondary | ICD-10-CM

## 2022-08-24 DIAGNOSIS — G43801 Other migraine, not intractable, with status migrainosus: Secondary | ICD-10-CM

## 2022-08-24 MED ORDER — KETOROLAC TROMETHAMINE 30 MG/ML IJ SOLN
30.0000 mg | Freq: Once | INTRAMUSCULAR | Status: AC
Start: 2022-08-24 — End: 2022-08-24
  Administered 2022-08-24: 30 mg via INTRAMUSCULAR

## 2022-08-24 MED ORDER — METHYLPREDNISOLONE 4 MG PO TBPK: ORAL_TABLET | ORAL | 0 refills | Status: DC

## 2022-08-24 MED ORDER — METHYLPREDNISOLONE ACETATE 40 MG/ML IJ SUSP
40.0000 mg | Freq: Once | INTRAMUSCULAR | Status: AC
Start: 2022-08-24 — End: 2022-08-24
  Administered 2022-08-24: 40 mg via INTRAMUSCULAR

## 2022-08-24 MED ORDER — FREESTYLE LIBRE SENSOR SYSTEM MISC: 11 refills | Status: DC

## 2022-08-24 MED ORDER — UBRELVY 100 MG PO TABS: ORAL_TABLET | ORAL | Status: DC

## 2022-08-24 MED ORDER — NURTEC 75 MG PO TBDP: ORAL_TABLET | ORAL | Status: DC

## 2022-08-24 NOTE — Progress Notes (Signed)
Heidi Holland is a 47 y.o. female here for a follow up of a pre-existing problem.  History of Present Illness:    Chief Complaint  Patient presents with   Joint Pain    Pt c/o pain hands, knees and feet.     HPI  Polyarthralgia: Complains of pain in her hands, knees, and feet.  Endorses associated swelling of her hands, waking up with numbness in her legs, tingling in her feet, and general inflammation. Notes she does have nerve damage in her left leg and has had her left hip replaced which may be contributing to pain.   Reports that 5-325 mg Vicodin did not relieve pain. PCP referred her to sports medicine for further evaluation however upon further consideration she does not want to proceed with this as she feels she would best be served with rheumatology. Interested in receiving steroid shot to manage pain.   Would like a referral for a rheumatologist.   Rash She notes raised skin-colored rash in little patches all over her body They seem to come about once she has pain She is concerned there is a correlation or underlying autoimmune issue  Diabetes:  Compliant with Metformin 1000 mg twice daily. Tolerates well. Notes that the GLP-1's work well for her but they are cost prohibitive. She reports that steroid injections typically do not cause significant elevations in her blood sugars. Lab Results  Component Value Date   HGBA1C 7.0 (A) 07/24/2022    Elevated blood pressure reading Currently taking no medication Endorses history of hypertension Has had at least two cups of coffee today and she reports that is a significant amount for her Patient denies chest pain, SOB, blurred vision, dizziness, unusual headaches, lower leg swelling.   BP Readings from Last 3 Encounters:  08/24/22 (!) 150/100  07/24/22 136/80  05/23/22 125/68   Scheduled to see cardiologoist Donato Schultz, MD from [facility] 09/19/2022.   Migraine:  Migraine diffuse to neck during today's  visit.  Reports she previously received Botox to manage episodes -- these were most effective but are cost prohibitive. Currently uses 25mg  Imitrex when needed with temporary relief for the rest of the day. Experiences drowsiness after use.   Denies thunderclap onset, vision changes, or other new issues with this migraine.  Irregular Menstrual Cycle: Reports that her periods have become irregular, her last period was two weeks late, bleeding was abnormally heavy and lasted only 2 days.  She believes it may be perimenopause due to her age. Notes that she was previously diagnosed with PCOS and is HPV positive.   Bipolar Disorder She is currently overall well controlled She states that steroids do not tend to cause mania Denies SI/HI  Past Medical History:  Diagnosis Date   Allergy    Anxiety    Asthma    Depression    Diabetes mellitus without complication (HCC)    Diverticulitis    GERD (gastroesophageal reflux disease)    HPV in female    Hyperlipidemia    Migraines    Vertigo      Social History   Tobacco Use   Smoking status: Never   Smokeless tobacco: Never  Vaping Use   Vaping status: Never Used  Substance Use Topics   Alcohol use: Not Currently   Drug use: Yes    Types: Marijuana    Comment: last time yesterday 05/22/22 per patient    Past Surgical History:  Procedure Laterality Date   BOWEL RESECTION  gasticbypass     GASTRIC BYPASS     KNEE ARTHROSCOPY     TOTAL HIP ARTHROPLASTY Left     Family History  Problem Relation Age of Onset   Heart disease Mother    Lung cancer Mother    Rheum arthritis Mother    Arthritis Mother    Cancer Mother    COPD Mother    Miscarriages / Stillbirths Mother    Fibromyalgia Mother    Diabetes Father    Heart disease Father    Breast cancer Paternal Aunt     Allergies  Allergen Reactions   Ciprofloxacin Itching    Current Medications:   Current Outpatient Medications:    albuterol (VENTOLIN HFA) 108 (90  Base) MCG/ACT inhaler, INHALE 2 PUFFS BY MOUTH EVERY 6 HOURS AS NEEDED FOR WHEEZE OR SHORTNESS OF BREATH, Disp: 8.5 each, Rfl: 2   AUVELITY 45-105 MG TBCR, Take by mouth., Disp: , Rfl:    azelastine (ASTELIN) 0.1 % nasal spray, Place 2 sprays into both nostrils 2 (two) times daily., Disp: 30 mL, Rfl: 12   blood glucose meter kit and supplies KIT, Dispense based on patient and insurance preference. Use up to four times daily as directed. (FOR ICD-9 250.00, 250.01)., Disp: 1 each, Rfl: 0   clonazePAM (KLONOPIN) 0.5 MG tablet, Take 1 tablet (0.5 mg total) by mouth 2 (two) times daily as needed. for anxiety, Disp: 60 tablet, Rfl: 0   Continuous Blood Gluc Receiver (FREESTYLE LIBRE READER) DEVI, 1 Device by Does not apply route daily as needed (to check blood sugar)., Disp: 1 each, Rfl: 0   HYDROcodone-acetaminophen (NORCO) 10-325 MG tablet, Take 1 tablet by mouth every 6 (six) hours as needed., Disp: , Rfl:    lumateperone tosylate (CAPLYTA) 42 MG capsule, , Disp: , Rfl:    MAGNESIUM PO, Take by mouth., Disp: , Rfl:    metFORMIN (GLUCOPHAGE) 1000 MG tablet, TAKE 1 TABLET BY MOUTH TWICE A DAY WITH A MEAL, Disp: 180 tablet, Rfl: 1   methylPREDNISolone (MEDROL DOSEPAK) 4 MG TBPK tablet, Take as directed, Disp: 1 each, Rfl: 0   ondansetron (ZOFRAN-ODT) 4 MG disintegrating tablet, Take 1 tablet (4 mg total) by mouth every 6 (six) hours as needed for nausea or vomiting., Disp: 30 tablet, Rfl: 5   pantoprazole (PROTONIX) 40 MG tablet, Take 1 tablet (40 mg total) by mouth daily. TAKE 1 TABLET BY MOUTH 30-60 minutes before breakfast, Disp: 30 tablet, Rfl: 11   pregabalin (LYRICA) 150 MG capsule, TAKE 1 CAPSULE BY MOUTH TWICE A DAY, Disp: 60 capsule, Rfl: 5   QUEtiapine (SEROQUEL) 100 MG tablet, Take by mouth., Disp: , Rfl:    Rimegepant Sulfate (NURTEC) 75 MG TBDP, Take as directed, Disp: , Rfl:    Semaglutide,0.25 or 0.5MG /DOS, (OZEMPIC, 0.25 OR 0.5 MG/DOSE,) 2 MG/3ML SOPN, Inject 0.25-0.5 mg into the skin once  a week., Disp: 3 mL, Rfl: 5   SUMAtriptan (IMITREX) 25 MG tablet, Take 1 tablet (25 mg total) by mouth every 2 (two) hours as needed for migraine. May repeat in 2 hours if headache persists or recurs., Disp: 10 tablet, Rfl: 1   tiZANidine (ZANAFLEX) 4 MG tablet, TAKE 1 TABLET BY MOUTH 3 TIMES DAILY., Disp: 270 tablet, Rfl: 1   Ubrogepant (UBRELVY) 100 MG TABS, Take as directed, Disp: , Rfl:    zolpidem (AMBIEN) 5 MG tablet, Take 1 tablet (5 mg total) by mouth at bedtime as needed for sleep., Disp: 30 tablet, Rfl: 5  Continuous Glucose Sensor (FREESTYLE LIBRE SENSOR SYSTEM) MISC, Apply 1 sensor to skin every 14 days., Disp: 2 each, Rfl: 11   rosuvastatin (CRESTOR) 20 MG tablet, Take 1 tablet (20 mg total) by mouth daily. (Patient not taking: Reported on 08/24/2022), Disp: 90 tablet, Rfl: 3   Review of Systems:   Review of Systems  Genitourinary:        (+) Abnormal Menstrual Cycle -- See HPI   Musculoskeletal:  Joint pain: hands, knees, and feet.       (+) Swelling (hands) (+) General inflammation   Neurological:  Positive for tingling (feet) and headaches (Migraine diffuse towards neck).    Vitals:   Vitals:   08/24/22 1418 08/24/22 1450  BP: (!) 150/100 (!) 150/100  Pulse: 76   Temp: 98 F (36.7 C)   TempSrc: Temporal   SpO2: 97%   Weight: 198 lb 4 oz (89.9 kg)   Height: 5\' 3"  (1.6 m)      Body mass index is 35.12 kg/m.  Physical Exam:   Physical Exam Vitals and nursing note reviewed.  Constitutional:      General: She is not in acute distress.    Appearance: She is well-developed. She is not ill-appearing or toxic-appearing.  Cardiovascular:     Rate and Rhythm: Normal rate and regular rhythm.     Pulses: Normal pulses.     Heart sounds: Normal heart sounds, S1 normal and S2 normal.  Pulmonary:     Effort: Pulmonary effort is normal.     Breath sounds: Normal breath sounds.  Skin:    General: Skin is warm and dry.     Comments: Scattered areas of approximately  half-dollar sized raised flesh-colored papules on b/l arms, legs and trunk  Neurological:     Mental Status: She is alert.     GCS: GCS eye subscore is 4. GCS verbal subscore is 5. GCS motor subscore is 6.  Psychiatric:        Speech: Speech normal.        Behavior: Behavior normal. Behavior is cooperative.     Assessment and Plan:   Bilateral hand pain; Paresthesia; Polyarthralgia No red flags Having ongoing symptom(s) consistent with past episodes Requesting Toradol and Prednisone injections -- provided today Also requesting Medrol dose pack -- to use tomorrow due to concern for ongoing pain and has responded well in the past to this  Rash and nonspecific skin eruption Unclear etiology Denies any significant symptom(s) associated with this Will defer to rheumatology or Primary Care Provider (PCP) for further evaluation -- sooner if causes symptom(s) We are treating with prednisone as well for above issue which may help with symptom(s)   Type 2 diabetes mellitus without complication, without long-term current use of insulin (HCC) Reviewed risk of increased blood sugars with steroid injection and oral prednisone Provided Ozempic sample per her request I have asked her to call us if blood sugars remain elevated Free Style Libre samples provided for patient today as well Follow-up with PCP for ongoing mgmt  Irregular periods Requesting referral to her gynecologist as she tried to schedule and was told needs referral as she hasn't been there in quite some time -- new referral placed  Other migraine with status migrainosus, not intractable Uncontrolled I provided samples for Nurtec and Ubrelvy -- I have given instructions on AVS on how to administer -- I've asked her to reach out if she would like one of these prescribed   I,Emily Lagle,acting as a scribe  for Jarold Motto, PA.,have documented all relevant documentation on the behalf of Jarold Motto, PA,as directed by   Jarold Motto, PA while in the presence of Jarold Motto, [place].  I, Jarold Motto, [place], have reviewed all documentation for this visit. The documentation on 08/24/22 for the exam, diagnosis, procedures, and orders are all accurate and complete.   Jarold Motto, PA-C

## 2022-08-24 NOTE — Patient Instructions (Signed)
It was great to see you!  For your next migraine may trial Nurtec - may only take ONE TABLET in 24 hours  If insufficient relief, on the next day may trial 50 mg ubrelvy and then repeat 50 mg ubrelvy in 2 hours  Let us know if you want one of these sent in -- I'm going to wait to send this in until I hear from you!  Start 0.25 mg weekly Ozempic - -follow-up with Dr. [name] for this  Start Free-Style libre - monitor blood sugars while receiving oral and injected steroids  If blood sugars get high, please call our office   Gynecology and Rheumatology referrals placed   Take care,  Jarold Motto PA-C

## 2022-09-03 ENCOUNTER — Ambulatory Visit: Payer: 59 | Admitting: Family Medicine

## 2022-09-10 DIAGNOSIS — F411 Generalized anxiety disorder: Secondary | ICD-10-CM | POA: Diagnosis not present

## 2022-09-10 DIAGNOSIS — F3162 Bipolar disorder, current episode mixed, moderate: Secondary | ICD-10-CM | POA: Diagnosis not present

## 2022-09-10 DIAGNOSIS — F4312 Post-traumatic stress disorder, chronic: Secondary | ICD-10-CM | POA: Diagnosis not present

## 2022-09-10 DIAGNOSIS — F410 Panic disorder [episodic paroxysmal anxiety] without agoraphobia: Secondary | ICD-10-CM | POA: Diagnosis not present

## 2022-09-10 DIAGNOSIS — F5102 Adjustment insomnia: Secondary | ICD-10-CM | POA: Diagnosis not present

## 2022-09-12 ENCOUNTER — Encounter (INDEPENDENT_AMBULATORY_CARE_PROVIDER_SITE_OTHER): Payer: Self-pay

## 2022-09-12 DIAGNOSIS — M5417 Radiculopathy, lumbosacral region: Secondary | ICD-10-CM | POA: Diagnosis not present

## 2022-09-12 DIAGNOSIS — M25552 Pain in left hip: Secondary | ICD-10-CM | POA: Diagnosis not present

## 2022-09-12 DIAGNOSIS — Z79899 Other long term (current) drug therapy: Secondary | ICD-10-CM | POA: Diagnosis not present

## 2022-09-12 DIAGNOSIS — M5416 Radiculopathy, lumbar region: Secondary | ICD-10-CM | POA: Diagnosis not present

## 2022-09-12 DIAGNOSIS — Z5181 Encounter for therapeutic drug level monitoring: Secondary | ICD-10-CM | POA: Diagnosis not present

## 2022-09-19 ENCOUNTER — Encounter: Payer: Self-pay | Admitting: Cardiology

## 2022-09-19 ENCOUNTER — Ambulatory Visit: Payer: 59 | Attending: Cardiology | Admitting: Cardiology

## 2022-09-23 ENCOUNTER — Other Ambulatory Visit: Payer: Self-pay | Admitting: Family Medicine

## 2022-09-25 ENCOUNTER — Encounter: Payer: 59 | Admitting: Family Medicine

## 2022-09-25 ENCOUNTER — Other Ambulatory Visit: Payer: Self-pay | Admitting: Neurology

## 2022-10-18 ENCOUNTER — Ambulatory Visit: Payer: 59 | Admitting: Family Medicine

## 2022-10-18 ENCOUNTER — Encounter: Payer: Self-pay | Admitting: Family Medicine

## 2022-10-18 NOTE — Progress Notes (Signed)
Patient did not keep appointment today. She may call to reschedule.  

## 2022-10-29 ENCOUNTER — Other Ambulatory Visit: Payer: Self-pay | Admitting: Family Medicine

## 2022-11-08 ENCOUNTER — Ambulatory Visit (HOSPITAL_COMMUNITY): Payer: 59

## 2022-11-16 ENCOUNTER — Ambulatory Visit (HOSPITAL_COMMUNITY): Payer: 59

## 2022-12-22 DIAGNOSIS — F410 Panic disorder [episodic paroxysmal anxiety] without agoraphobia: Secondary | ICD-10-CM | POA: Diagnosis not present

## 2022-12-22 DIAGNOSIS — F3131 Bipolar disorder, current episode depressed, mild: Secondary | ICD-10-CM | POA: Diagnosis not present

## 2022-12-22 DIAGNOSIS — F4310 Post-traumatic stress disorder, unspecified: Secondary | ICD-10-CM | POA: Diagnosis not present

## 2022-12-22 DIAGNOSIS — F5102 Adjustment insomnia: Secondary | ICD-10-CM | POA: Diagnosis not present

## 2022-12-22 DIAGNOSIS — F411 Generalized anxiety disorder: Secondary | ICD-10-CM | POA: Diagnosis not present

## 2022-12-27 ENCOUNTER — Ambulatory Visit (HOSPITAL_COMMUNITY): Payer: 59

## 2022-12-28 ENCOUNTER — Inpatient Hospital Stay (HOSPITAL_COMMUNITY): Admission: RE | Admit: 2022-12-28 | Payer: 59 | Source: Ambulatory Visit

## 2022-12-28 ENCOUNTER — Ambulatory Visit (HOSPITAL_COMMUNITY): Payer: 59

## 2022-12-30 ENCOUNTER — Other Ambulatory Visit: Payer: Self-pay

## 2022-12-30 ENCOUNTER — Ambulatory Visit (HOSPITAL_COMMUNITY)
Admission: RE | Admit: 2022-12-30 | Discharge: 2022-12-30 | Disposition: A | Discharge status: Home / Self Care | Payer: 59 | Source: Ambulatory Visit | Attending: Physician Assistant | Admitting: Physician Assistant

## 2022-12-30 ENCOUNTER — Encounter (HOSPITAL_COMMUNITY): Payer: Self-pay

## 2022-12-30 VITALS — BP 163/93 | HR 91 | Temp 97.9°F | Resp 20

## 2022-12-30 DIAGNOSIS — R1030 Lower abdominal pain, unspecified: Secondary | ICD-10-CM | POA: Insufficient documentation

## 2022-12-30 DIAGNOSIS — N730 Acute parametritis and pelvic cellulitis: Secondary | ICD-10-CM | POA: Insufficient documentation

## 2022-12-30 DIAGNOSIS — R102 Pelvic and perineal pain: Secondary | ICD-10-CM | POA: Insufficient documentation

## 2022-12-30 DIAGNOSIS — I10 Essential (primary) hypertension: Secondary | ICD-10-CM | POA: Insufficient documentation

## 2022-12-30 DIAGNOSIS — N898 Other specified noninflammatory disorders of vagina: Secondary | ICD-10-CM | POA: Insufficient documentation

## 2022-12-30 LAB — POCT URINALYSIS DIP (MANUAL ENTRY)
Bilirubin, UA: NEGATIVE
Glucose, UA: NEGATIVE mg/dL
Ketones, POC UA: NEGATIVE mg/dL — AB
Leukocytes, UA: NEGATIVE
Nitrite, UA: NEGATIVE
Protein Ur, POC: NEGATIVE mg/dL
Spec Grav, UA: <=1.005 — AB (ref 1.010–1.025)
Urobilinogen, UA: 0.2 U/dL
pH, UA: 6 (ref 5.0–8.0)

## 2022-12-30 LAB — POCT URINE PREGNANCY: Preg Test, Ur: NEGATIVE

## 2022-12-30 MED ORDER — CEFTRIAXONE SODIUM 500 MG IJ SOLR
500.0000 mg | INTRAMUSCULAR | Status: DC
  Administered 2022-12-30: 500 mg via INTRAMUSCULAR

## 2022-12-30 MED ORDER — AMLODIPINE BESYLATE 5 MG PO TABS: 5.0000 mg | ORAL_TABLET | Freq: Every day | ORAL | 1 refills | Status: DC

## 2022-12-30 MED ORDER — CEFTRIAXONE SODIUM 500 MG IJ SOLR
INTRAMUSCULAR | Status: AC
Start: 2022-12-30 — End: ?
  Filled 2022-12-30: qty 500

## 2022-12-30 MED ORDER — DOXYCYCLINE HYCLATE 100 MG PO CAPS: 100.0000 mg | ORAL_CAPSULE | Freq: Two times a day (BID) | ORAL | 0 refills | Status: DC

## 2022-12-30 MED ORDER — LIDOCAINE HCL (PF) 1 % IJ SOLN
INTRAMUSCULAR | Status: AC
  Filled 2022-12-30: qty 2

## 2022-12-30 MED ORDER — METRONIDAZOLE 500 MG PO TABS
500.0000 mg | ORAL_TABLET | Freq: Two times a day (BID) | ORAL | 0 refills | Status: DC
Start: 2022-12-30 — End: 2023-02-11

## 2022-12-30 NOTE — Discharge Instructions (Signed)
We are treating for a pelvic infection.  We gave an injection today and then I would like you to start doxycycline 100 mg twice daily for 2 weeks.  Stay out of the sun while on this medication.  Take metronidazole twice daily for 2 weeks.  Do not drink any alcohol while on this medication for 3 days after completing course.  If you develop any pelvic pain, abdominal pain, fever, nausea, vomiting you need to be seen in the emergency room.  Your blood pressure is elevated.  Start amlodipine 5 mg daily.  Monitor your blood pressure and if this remains above 140/90 after you have been on the medication for several weeks please return as we can adjust your medicine.  If you develop any chest pain, shortness of breath, headache, vision change, dizziness in setting of high blood pressure you need to go to the emergency room.  Avoid decongestants, caffeine, sodium, NSAIDs (aspirin, ibuprofen/Advil, naproxen/Aleve).

## 2022-12-30 NOTE — ED Provider Notes (Signed)
[facility]    CSN: [id number] Arrival date & time: 12/30/22  1445      History   Chief Complaint Chief Complaint  Patient presents with   Urinary Frequency    Uti - Entered by patient   Appointment    15:00    HPI Heidi Holland is a 47 y.o. female.   Patient presents today with multiple symptoms.  She reports that for the past several weeks she has not been feeling well.  She has had headache, lower abdominal pain, back pain with associated muscle cramping, cough/congestion.  She did not think much of this as she has a history of chronic pain and is followed by pain management as well as migraines.  She has been taking her prescribed medication without improvement of symptoms.  Over the past several days she has developed dysuria and frequent urination.  She reports 1 episode of urgency that resulted in incontinence.  She denies any saddle anesthesia, additional bowel/bladder incontinence or retention, lower extremity weakness.  She denies any recent urogenital procedure, self-catheterization.  She does have a history of diabetes but does not take SGLT2 inhibitor.  In addition, patient is known to have elevated blood pressure.  Review of chart indicates that she has had elevated blood pressure readings for the past several months.  She is having headaches but attributed this to her migraines.  She is not currently prescribed antihypertensive medication.  She has been taking sumatriptan and over-the-counter analgesics regularly because of her pain and believes this could be contributing to her symptoms.  She has had hypertension in the past but was able to discontinue these medications after gastric bypass many years ago.  She denies any chest pain, shortness of breath, dizziness.  She does report an ongoing headache but states this is not the worst headache of her life and believes this is mostly attributed to her chronic pain.    Past Medical History:  Diagnosis Date    Allergy    Anxiety    Asthma    Depression    Diabetes mellitus without complication (HCC)    Diverticulitis    GERD (gastroesophageal reflux disease)    HPV in female    Hyperlipidemia    Migraines    Vertigo     Patient Active Problem List   Diagnosis Date Noted   Bipolar 1 disorder, depressed (HCC) 02/16/2022   Paresthesia 09/13/2021   CIN I (cervical intraepithelial neoplasia I) 03/03/2021   LSIL pap smear of cervix/human papillomavirus (HPV) positive 10/24/2020   Family history of early CAD 10/24/2020   B12 deficiency 07/26/2020   Somatic dysfunction of spine, cervical 07/21/2020   Status post left hip replacement 03/12/2019   T2DM (type 2 diabetes mellitus) (HCC) 07/31/2018   Dyslipidemia associated with type 2 diabetes mellitus (HCC) 07/31/2018   Allergic rhinitis 07/31/2018   Mild intermittent asthma, uncomplicated 07/31/2018   GERD (gastroesophageal reflux disease) 07/31/2018   S/P gastric bypass 07/31/2018   Migraine 07/31/2018   Anxiety 07/31/2018   Depression, major, single episode, complete remission (HCC) 07/31/2018   Insomnia 07/31/2018   Umbilical hernia 07/31/2018   Iron deficiency anemia 06/09/2018   Cervicogenic headache 05/12/2018   Low back pain 05/12/2018   Polyarthralgia 05/12/2018    Past Surgical History:  Procedure Laterality Date   BOWEL RESECTION     gasticbypass     GASTRIC BYPASS     KNEE ARTHROSCOPY     TOTAL HIP ARTHROPLASTY Left     OB  History     Gravida  0   Para  0   Term  0   Preterm  0   AB  0   Living  0      SAB  0   IAB  0   Ectopic  0   Multiple  0   Live Births  0            Home Medications    Prior to Admission medications   Medication Sig Start Date End Date Taking? Authorizing Provider  amLODipine (NORVASC) 5 MG tablet Take 1 tablet (5 mg total) by mouth daily. 12/30/22  Yes Tyrin Herbers K, PA-C  doxycycline (VIBRAMYCIN) 100 MG capsule Take 1 capsule (100 mg total) by mouth 2 (two)  times daily. 12/30/22  Yes Antinette Keough K, PA-C  metroNIDAZOLE (FLAGYL) 500 MG tablet Take 1 tablet (500 mg total) by mouth 2 (two) times daily. 12/30/22  Yes Teran Daughenbaugh K, PA-C  albuterol (VENTOLIN HFA) 108 (90 Base) MCG/ACT inhaler INHALE 2 PUFFS BY MOUTH EVERY 6 HOURS AS NEEDED FOR WHEEZE OR SHORTNESS OF BREATH 04/10/21   Ardith Dark, MD  AUVELITY 45-105 MG TBCR Take by mouth. Patient not taking: Reported on 12/30/2022 05/10/22   [provider]  azelastine (ASTELIN) 0.1 % nasal spray Place 2 sprays into both nostrils 2 (two) times daily. 04/27/22   Ardith Dark, MD  blood glucose meter kit and supplies KIT Dispense based on patient and insurance preference. Use up to four times daily as directed. (FOR ICD-9 250.00, 250.01). 07/31/18   Ardith Dark, MD  clonazePAM (KLONOPIN) 0.5 MG tablet Take 1 tablet (0.5 mg total) by mouth 2 (two) times daily as needed. for anxiety 02/20/22   Ardith Dark, MD  Continuous Blood Gluc Receiver (FREESTYLE LIBRE READER) DEVI 1 Device by Does not apply route daily as needed (to check blood sugar). 11/02/21   Ardith Dark, MD  Continuous Glucose Sensor (FREESTYLE LIBRE SENSOR SYSTEM) MISC Apply 1 sensor to skin every 14 days. 08/24/22   Ardith Dark, MD  HYDROcodone-acetaminophen [facility]) 10-325 MG tablet Take 1 tablet by mouth every 6 (six) hours as needed. 05/07/22   [provider]  lumateperone tosylate (CAPLYTA) 42 MG capsule  01/05/21   [provider]  MAGNESIUM PO Take by mouth. 02/16/22   [provider]  metFORMIN (GLUCOPHAGE) 1000 MG tablet TAKE 1 TABLET BY MOUTH TWICE A DAY WITH FOOD 10/29/22   Ardith Dark, MD  methylPREDNISolone (MEDROL DOSEPAK) 4 MG TBPK tablet Take as directed Patient not taking: Reported on 12/30/2022 08/24/22   Jarold Motto, PA  ondansetron (ZOFRAN-ODT) 4 MG disintegrating tablet Take 1 tablet (4 mg total) by mouth every 6 (six) hours as needed for nausea or vomiting. 03/29/22    Unk Lightning, PA  pantoprazole (PROTONIX) 40 MG tablet Take 1 tablet (40 mg total) by mouth daily. TAKE 1 TABLET BY MOUTH 30-60 minutes before breakfast 04/27/22   Ardith Dark, MD  pregabalin (LYRICA) 150 MG capsule TAKE 1 CAPSULE BY MOUTH TWICE A DAY 08/21/21   Ardith Dark, MD  QUEtiapine (SEROQUEL) 100 MG tablet Take by mouth. 05/03/21   [provider]  Rimegepant Sulfate (NURTEC) 75 MG TBDP Take as directed 08/24/22   Jarold Motto, PA  rosuvastatin (CRESTOR) 20 MG tablet Take 1 tablet (20 mg total) by mouth daily. Patient not taking: Reported on 08/24/2022 11/02/21   Ardith Dark, MD  Semaglutide,0.25  or 0.5MG /DOS, (OZEMPIC, 0.25 OR 0.5 MG/DOSE,) 2 MG/3ML SOPN Inject 0.25-0.5 mg into the skin once a week. 07/24/22   Ardith Dark, MD  SUMAtriptan (IMITREX) 25 MG tablet Take 1 tablet (25 mg total) by mouth every 2 (two) hours as needed for migraine. May repeat in 2 hours if headache persists or recurs. 07/24/22   Ardith Dark, MD  tiZANidine (ZANAFLEX) 4 MG tablet TAKE 1 TABLET BY MOUTH THREE TIMES A DAY 09/24/22   Ardith Dark, MD  Ubrogepant (UBRELVY) 100 MG TABS Take as directed 08/24/22   Jarold Motto, PA  zolpidem (AMBIEN) 5 MG tablet Take 1 tablet (5 mg total) by mouth at bedtime as needed for sleep. 04/18/21   Ardith Dark, MD    Family History Family History  Problem Relation Age of Onset   Heart disease Mother    Lung cancer Mother    Rheum arthritis Mother    Arthritis Mother    Cancer Mother    COPD Mother    Miscarriages / [place] Mother    Fibromyalgia Mother    Diabetes Father    Heart disease Father    Breast cancer Paternal Aunt     Social History Social History   Tobacco Use   Smoking status: Never   Smokeless tobacco: Never  Vaping Use   Vaping status: Never Used  Substance Use Topics   Alcohol use: Not Currently   Drug use: Yes    Types: Marijuana     Allergies   Ciprofloxacin   Review of Systems Review  of Systems  Constitutional:  Positive for activity change. Negative for appetite change, fatigue and fever.  Respiratory:  Negative for shortness of breath.   Cardiovascular:  Negative for chest pain.  Gastrointestinal:  Positive for abdominal pain and nausea. Negative for diarrhea and vomiting.  Genitourinary:  Positive for dysuria, enuresis, frequency and pelvic pain. Negative for urgency, vaginal bleeding, vaginal discharge and vaginal pain.  Musculoskeletal:  Positive for back pain (chronic) and myalgias. Negative for arthralgias.     Physical Exam Triage Vital Signs ED Triage Vitals  Encounter Vitals Group     BP 12/30/22 1523 (!) 179/97     Systolic BP Percentile --      Diastolic BP Percentile --      Pulse Rate 12/30/22 1523 91     Resp 12/30/22 1523 20     Temp 12/30/22 1523 97.9 F (36.6 C)     Temp Source 12/30/22 1523 Oral     SpO2 12/30/22 1523 98 %     Weight --      Height --      Head Circumference --      Peak Flow --      Pain Score 12/30/22 1520 8     Pain Loc --      Pain Education --      Exclude from Growth Chart --    No data found.  Updated Vital Signs BP (!) 163/93 (BP Location: Left Arm) Comment (BP Location): repositioned arm  Pulse 91   Temp 97.9 F (36.6 C) (Oral)   Resp 20   LMP 12/03/2022 (Approximate)   SpO2 98%   Visual Acuity Right Eye Distance:   Left Eye Distance:   Bilateral Distance:    Right Eye Near:   Left Eye Near:    Bilateral Near:     Physical Exam Vitals reviewed. Exam conducted with a chaperone present.  Constitutional:  General: She is awake. She is not in acute distress.    Appearance: Normal appearance. She is well-developed. She is not ill-appearing.     Comments: Very pleasant female appears stated age in no acute distress sitting comfortably in exam room  HENT:     Head: Normocephalic and atraumatic.     Right Ear: Tympanic membrane, ear canal and external ear normal.     Left Ear: Tympanic membrane,  ear canal and external ear normal.     Nose: Nose normal.     Mouth/Throat:     Pharynx: Uvula midline. No oropharyngeal exudate or posterior oropharyngeal erythema.  Cardiovascular:     Rate and Rhythm: Normal rate and regular rhythm.     Heart sounds: Normal heart sounds, S1 normal and S2 normal. No murmur heard. Pulmonary:     Effort: Pulmonary effort is normal.     Breath sounds: Normal breath sounds. No wheezing, rhonchi or rales.     Comments: Clear to auscultation bilaterally Abdominal:     General: Bowel sounds are normal.     Palpations: Abdomen is soft.     Tenderness: There is abdominal tenderness in the suprapubic area. There is no right CVA tenderness, left CVA tenderness, guarding or rebound.  Genitourinary:    Labia:        Right: No rash or tenderness.        Left: No rash or tenderness.      Vagina: Vaginal discharge present.     Cervix: Cervical motion tenderness present.     Uterus: Normal. No uterine prolapse.      Adnexa:        Right: Tenderness present.        Left: Tenderness present.      Comments: Kim, RN present as chaperone during exam.  Significant thick brown discharge noted posterior vaginal vault.  Tenderness on bimanual exam. Musculoskeletal:     Cervical back: No tenderness or bony tenderness.     Thoracic back: Tenderness present. No bony tenderness.     Lumbar back: Tenderness present. No bony tenderness.     Comments: Tenderness palpation throughout thoracic and lumbar back.  Psychiatric:        Behavior: Behavior is cooperative.      UC Treatments / Results  Labs (all labs ordered are listed, but only abnormal results are displayed) Labs Reviewed  POCT URINALYSIS DIP (MANUAL ENTRY) - Abnormal; Notable for the following components:      Result Value   Color, UA light yellow (*)    Ketones, POC UA negative (*)    Spec Grav, UA <=1.005 (*)    Blood, UA trace-intact (*)    All other components within normal limits  URINE CULTURE   POCT URINE PREGNANCY  CERVICOVAGINAL ANCILLARY ONLY    EKG   Radiology No results found.  Procedures Procedures (including critical care time)  Medications Ordered in UC Medications  cefTRIAXone (ROCEPHIN) injection 500 mg (has no administration in time range)    Initial Impression / Assessment and Plan / UC Course  I have reviewed the triage vital signs and the nursing notes.  Pertinent labs & imaging results that were available during my care of the patient were reviewed by me and considered in my medical decision making (see chart for details).     Patient is well-appearing, afebrile, nontoxic, nontachycardic.  Urine pregnancy was negative.  UA showed no significant evidence of infection other than trace blood.  Will send this for  culture given her symptoms but defer additional antibiotics until culture results available.  Given ongoing pelvic pain without obvious urinary etiology pelvic exam was performed that showed significant tenderness concerning for PID.  Was empirically treated for PID and STI swab was collected and is pending.  We discussed that she is to avoid sun exposure while on doxycycline and alcohol consumption while on metronidazole due to concern for side effects/adverse reaction.  She is to rest and drink plenty of fluid.  We discussed that if she has any worsening or changing symptoms including pelvic pain, abdominal pain, fever, nausea, vomiting she needs to be seen emergently.  Strict return precautions given.  Excuse note provided.  Patient's blood pressure was elevated.  She is having headache but this is her chronic headache so we will defer ER evaluation at this time but we did discuss that if her symptoms do not improve or if anything worsens she should go to the emergency room.  She was started on amlodipine to manage her blood pressure.  Discussed that she is not to take NSAIDs, decongestants, caffeine, sodium to manage her blood pressure.  Discussed that  if she develops any chest pain, shortness of breath, headache, vision change, dizziness in the setting of high blood pressure she should go to the emergency room.  She is to follow-up with either our clinic or primary care within a few weeks for recheck.  Final Clinical Impressions(s) / UC Diagnoses   Final diagnoses:  PID (acute pelvic inflammatory disease)  Pelvic pain  Lower abdominal pain  Vaginal discharge  Elevated blood pressure reading with diagnosis of hypertension     Discharge Instructions      We are treating for a pelvic infection.  We gave an injection today and then I would like you to start doxycycline 100 mg twice daily for 2 weeks.  Stay out of the sun while on this medication.  Take metronidazole twice daily for 2 weeks.  Do not drink any alcohol while on this medication for 3 days after completing course.  If you develop any pelvic pain, abdominal pain, fever, nausea, vomiting you need to be seen in the emergency room.  Your blood pressure is elevated.  Start amlodipine 5 mg daily.  Monitor your blood pressure and if this remains above 140/90 after you have been on the medication for several weeks please return as we can adjust your medicine.  If you develop any chest pain, shortness of breath, headache, vision change, dizziness in setting of high blood pressure you need to go to the emergency room.  Avoid decongestants, caffeine, sodium, NSAIDs (aspirin, ibuprofen/Advil, naproxen/Aleve).     ED Prescriptions     Medication Sig Dispense Auth. Provider   amLODipine (NORVASC) 5 MG tablet Take 1 tablet (5 mg total) by mouth daily. 30 tablet Chantelle Verdi K, PA-C   doxycycline (VIBRAMYCIN) 100 MG capsule Take 1 capsule (100 mg total) by mouth 2 (two) times daily. 28 capsule [place] Rosamond K, PA-C   metroNIDAZOLE (FLAGYL) 500 MG tablet Take 1 tablet (500 mg total) by mouth 2 (two) times daily. 28 tablet Kaylib Furness, Noberto Retort, PA-C      PDMP not reviewed this encounter.    Jeani Hawking, PA-C 12/30/22 1619

## 2022-12-30 NOTE — ED Notes (Signed)
Reviewed work note 

## 2022-12-30 NOTE — ED Triage Notes (Signed)
Urinary symptoms started yesterday, back pain, headache, cramping pain for 1 1/2 weeks.  Patient has burning with urination and frequent urination

## 2022-12-31 ENCOUNTER — Telehealth (HOSPITAL_COMMUNITY): Payer: Self-pay | Admitting: Emergency Medicine

## 2022-12-31 ENCOUNTER — Telehealth (HOSPITAL_COMMUNITY): Payer: Self-pay

## 2022-12-31 LAB — CERVICOVAGINAL ANCILLARY ONLY
Bacterial Vaginitis (gardnerella): NEGATIVE
Candida Glabrata: NEGATIVE
Candida Vaginitis: NEGATIVE
Chlamydia: NEGATIVE
Comment: NEGATIVE
Comment: NEGATIVE
Comment: NEGATIVE
Comment: NEGATIVE
Comment: NEGATIVE
Comment: NORMAL
Neisseria Gonorrhea: NEGATIVE
Trichomonas: NEGATIVE

## 2022-12-31 MED ORDER — NYSTATIN 100000 UNIT/ML MT SUSP: 500000.0000 [IU] | Freq: Four times a day (QID) | OROMUCOSAL | 0 refills | Status: DC

## 2022-12-31 MED ORDER — FLUCONAZOLE 150 MG PO TABS: 150.0000 mg | ORAL_TABLET | ORAL | 0 refills | Status: DC

## 2022-12-31 MED ORDER — FLUCONAZOLE 150 MG PO TABS: 150.0000 mg | ORAL_TABLET | Freq: Every day | ORAL | 0 refills | Status: DC

## 2022-12-31 NOTE — Telephone Encounter (Signed)
Patient called staff requesting oral rinse and Diflucan d/t risk of yeast infections orally and vaginally. Sent to patient's pharmacy per request.

## 2022-12-31 NOTE — Telephone Encounter (Signed)
I received a message from nursing staff requesting that I contact patient regarding yeast infection medication.  It appears that this had already been sent in by another staff member but was sent to the incorrect pharmacy.  I called the patient and she reported that she needs these medications sent to a local pharmacy and also typically requires 3 doses of Diflucan to manage her symptoms.  She does take Seroquel but has safely taken Diflucan for short courses in the past.  She also requested restrictions be added to her work note but discussed that this is not something we can provide in urgent care.  All questions and concerns addressed to patient's satisfaction.

## 2022-12-31 NOTE — Telephone Encounter (Signed)
Patient called requesting medication for a yeast infection due to being prescribed antibiotic for UTI. Staff had [place] (NP) to send in a prescription for the patient.

## 2023-01-01 LAB — URINE CULTURE: Culture: NO GROWTH

## 2023-01-14 ENCOUNTER — Telehealth: Payer: Self-pay | Admitting: Family Medicine

## 2023-01-14 NOTE — Telephone Encounter (Signed)
Patient requests TOC from Dr. [name] to [facility].  Please advise.

## 2023-01-14 NOTE — Telephone Encounter (Signed)
Ok with me.  Heidi Holland. [name], MD 01/14/2023 12:25 PM

## 2023-01-15 NOTE — Telephone Encounter (Signed)
S [place],PA decline transfer of care

## 2023-01-16 ENCOUNTER — Emergency Department (HOSPITAL_COMMUNITY)
Admission: EM | Admit: 2023-01-16 | Discharge: 2023-01-17 | Disposition: A | Discharge status: Home / Self Care | Payer: Medicaid Other | Attending: Emergency Medicine | Admitting: Emergency Medicine

## 2023-01-16 ENCOUNTER — Other Ambulatory Visit: Payer: Self-pay

## 2023-01-16 ENCOUNTER — Emergency Department (HOSPITAL_COMMUNITY): Payer: Medicaid Other

## 2023-01-16 ENCOUNTER — Encounter (HOSPITAL_COMMUNITY): Payer: Self-pay

## 2023-01-16 DIAGNOSIS — N83201 Unspecified ovarian cyst, right side: Secondary | ICD-10-CM | POA: Diagnosis not present

## 2023-01-16 DIAGNOSIS — R1084 Generalized abdominal pain: Secondary | ICD-10-CM

## 2023-01-16 DIAGNOSIS — Z79899 Other long term (current) drug therapy: Secondary | ICD-10-CM | POA: Insufficient documentation

## 2023-01-16 DIAGNOSIS — R109 Unspecified abdominal pain: Secondary | ICD-10-CM | POA: Diagnosis present

## 2023-01-16 DIAGNOSIS — I10 Essential (primary) hypertension: Secondary | ICD-10-CM | POA: Diagnosis not present

## 2023-01-16 LAB — URINALYSIS, ROUTINE W REFLEX MICROSCOPIC
Bilirubin Urine: NEGATIVE
Glucose, UA: NEGATIVE mg/dL
Hgb urine dipstick: NEGATIVE
Ketones, ur: NEGATIVE mg/dL
Leukocytes,Ua: NEGATIVE
Nitrite: NEGATIVE
Protein, ur: NEGATIVE mg/dL
Specific Gravity, Urine: 1.001 — ABNORMAL LOW (ref 1.005–1.030)
pH: 7 (ref 5.0–8.0)

## 2023-01-16 LAB — COMPREHENSIVE METABOLIC PANEL
ALT: 15 U/L (ref 0–44)
AST: 13 U/L — ABNORMAL LOW (ref 15–41)
Albumin: 4.1 g/dL (ref 3.5–5.0)
Alkaline Phosphatase: 62 U/L (ref 38–126)
Anion gap: 10 (ref 5–15)
BUN: 6 mg/dL (ref 6–20)
CO2: 22 mmol/L (ref 22–32)
Calcium: 9.5 mg/dL (ref 8.9–10.3)
Chloride: 103 mmol/L (ref 98–111)
Creatinine, Ser: 0.65 mg/dL (ref 0.44–1.00)
GFR, Estimated: >60 mL/min (ref >60)
Glucose, Bld: 86 mg/dL (ref 70–99)
Potassium: 3.5 mmol/L (ref 3.5–5.1)
Sodium: 135 mmol/L (ref 135–145)
Total Bilirubin: 0.5 mg/dL (ref <1.2)
Total Protein: 7.4 g/dL (ref 6.5–8.1)

## 2023-01-16 LAB — CBC
HCT: 38.3 % (ref 36.0–46.0)
Hemoglobin: 12 g/dL (ref 12.0–15.0)
MCH: 24.9 pg — ABNORMAL LOW (ref 26.0–34.0)
MCHC: 31.3 g/dL (ref 30.0–36.0)
MCV: 79.5 fL — ABNORMAL LOW (ref 80.0–100.0)
Platelets: 349 10*3/uL (ref 150–400)
RBC: 4.82 MIL/uL (ref 3.87–5.11)
RDW: 14.6 % (ref 11.5–15.5)
WBC: 9.1 10*3/uL (ref 4.0–10.5)
nRBC: 0 % (ref 0.0–0.2)

## 2023-01-16 LAB — HCG, SERUM, QUALITATIVE: Preg, Serum: NEGATIVE

## 2023-01-16 LAB — LIPASE, BLOOD: Lipase: 40 U/L (ref 11–51)

## 2023-01-16 MED ORDER — HYDROMORPHONE HCL 1 MG/ML IJ SOLN
0.5000 mg | Freq: Once | INTRAMUSCULAR | Status: AC
  Administered 2023-01-17: 0.5 mg via INTRAVENOUS
  Filled 2023-01-16: qty 1

## 2023-01-16 MED ORDER — SODIUM CHLORIDE 0.9 % IV BOLUS
1000.0000 mL | Freq: Once | INTRAVENOUS | Status: AC
  Administered 2023-01-16: 1000 mL via INTRAVENOUS

## 2023-01-16 MED ORDER — HYDROMORPHONE HCL 1 MG/ML IJ SOLN
0.5000 mg | Freq: Once | INTRAMUSCULAR | Status: AC
  Administered 2023-01-16: 0.5 mg via INTRAVENOUS
  Filled 2023-01-16: qty 1

## 2023-01-16 MED ORDER — AMLODIPINE BESYLATE 5 MG PO TABS
5.0000 mg | ORAL_TABLET | Freq: Once | ORAL | Status: AC
  Administered 2023-01-17: 5 mg via ORAL
  Filled 2023-01-16: qty 1

## 2023-01-16 MED ORDER — ONDANSETRON HCL 4 MG/2ML IJ SOLN
4.0000 mg | Freq: Once | INTRAMUSCULAR | Status: AC
  Administered 2023-01-16: 4 mg via INTRAVENOUS
  Filled 2023-01-16: qty 2

## 2023-01-16 MED ORDER — IOHEXOL 300 MG/ML  SOLN
100.0000 mL | Freq: Once | INTRAMUSCULAR | Status: AC | PRN
  Administered 2023-01-16: 100 mL via INTRAVENOUS

## 2023-01-16 NOTE — ED Provider Notes (Incomplete)
[facility] AT [facility] Provider Note   CSN: [id number] Arrival date & time: 01/16/23  1714     History {Add pertinent medical, surgical, social history, OB history to HPI:1} Chief Complaint  Patient presents with  . Abdominal Pain    Heidi Holland is a 47 y.o. female.  Patient complains of abdominal pain.  Patient reports that she has had recent diarrhea.  Patient reports that she has had weight loss.  Patient reports she has had a history of gastric bypass.  Patient has had a bowel resection in the past secondary to diverticulitis.  The history is provided by the patient. No language interpreter was used.  Abdominal Pain      Home Medications Prior to Admission medications   Medication Sig Start Date End Date Taking? Authorizing Provider  albuterol (VENTOLIN HFA) 108 (90 Base) MCG/ACT inhaler INHALE 2 PUFFS BY MOUTH EVERY 6 HOURS AS NEEDED FOR WHEEZE OR SHORTNESS OF BREATH 04/10/21   Ardith Dark, MD  amLODipine (NORVASC) 5 MG tablet Take 1 tablet (5 mg total) by mouth daily. 12/30/22   Raspet, Erin K, PA-C  AUVELITY 45-105 MG TBCR Take by mouth. Patient not taking: Reported on 12/30/2022 05/10/22   [provider]  azelastine (ASTELIN) 0.1 % nasal spray Place 2 sprays into both nostrils 2 (two) times daily. 04/27/22   Ardith Dark, MD  blood glucose meter kit and supplies KIT Dispense based on patient and insurance preference. Use up to four times daily as directed. (FOR ICD-9 250.00, 250.01). 07/31/18   Ardith Dark, MD  clonazePAM (KLONOPIN) 0.5 MG tablet Take 1 tablet (0.5 mg total) by mouth 2 (two) times daily as needed. for anxiety 02/20/22   Ardith Dark, MD  Continuous Blood Gluc Receiver (FREESTYLE LIBRE READER) DEVI 1 Device by Does not apply route daily as needed (to check blood sugar). 11/02/21   Ardith Dark, MD  Continuous Glucose Sensor (FREESTYLE LIBRE SENSOR SYSTEM) MISC Apply 1 sensor to skin every 14 days.  08/24/22   Ardith Dark, MD  doxycycline (VIBRAMYCIN) 100 MG capsule Take 1 capsule (100 mg total) by mouth 2 (two) times daily. 12/30/22   Raspet, Noberto Retort, PA-C  fluconazole (DIFLUCAN) 150 MG tablet Take 1 tablet (150 mg total) by mouth once a week. 12/31/22   Raspet, Noberto Retort, PA-C  HYDROcodone-acetaminophen (NORCO) 10-325 MG tablet Take 1 tablet by mouth every 6 (six) hours as needed. 05/07/22   [provider]  lumateperone tosylate (CAPLYTA) 42 MG capsule  01/05/21   [provider]  MAGNESIUM PO Take by mouth. 02/16/22   [provider]  metFORMIN (GLUCOPHAGE) 1000 MG tablet TAKE 1 TABLET BY MOUTH TWICE A DAY WITH FOOD 10/29/22   Ardith Dark, MD  methylPREDNISolone (MEDROL DOSEPAK) 4 MG TBPK tablet Take as directed Patient not taking: Reported on 12/30/2022 08/24/22   Jarold Motto, PA  metroNIDAZOLE (FLAGYL) 500 MG tablet Take 1 tablet (500 mg total) by mouth 2 (two) times daily. 12/30/22   Raspet, Noberto Retort, PA-C  nystatin (MYCOSTATIN) 100000 UNIT/ML suspension Take 5 mLs (500,000 Units total) by mouth 4 (four) times daily. 12/31/22   Raspet, Noberto Retort, PA-C  ondansetron (ZOFRAN-ODT) 4 MG disintegrating tablet Take 1 tablet (4 mg total) by mouth every 6 (six) hours as needed for nausea or vomiting. 03/29/22   Unk Lightning, PA  pantoprazole (PROTONIX) 40 MG tablet Take 1 tablet (40 mg total) by mouth daily. TAKE  1 TABLET BY MOUTH 30-60 minutes before breakfast 04/27/22   Ardith Dark, MD  pregabalin (LYRICA) 150 MG capsule TAKE 1 CAPSULE BY MOUTH TWICE A DAY 08/21/21   Ardith Dark, MD  QUEtiapine (SEROQUEL) 100 MG tablet Take by mouth. 05/03/21   [provider]  Rimegepant Sulfate (NURTEC) 75 MG TBDP Take as directed 08/24/22   Jarold Motto, PA  rosuvastatin (CRESTOR) 20 MG tablet Take 1 tablet (20 mg total) by mouth daily. Patient not taking: Reported on 08/24/2022 11/02/21   Ardith Dark, MD  Semaglutide,0.25 or 0.5MG /DOS, (OZEMPIC, 0.25 OR  0.5 MG/DOSE,) 2 MG/3ML SOPN Inject 0.25-0.5 mg into the skin once a week. 07/24/22   Ardith Dark, MD  SUMAtriptan (IMITREX) 25 MG tablet Take 1 tablet (25 mg total) by mouth every 2 (two) hours as needed for migraine. May repeat in 2 hours if headache persists or recurs. 07/24/22   Ardith Dark, MD  tiZANidine (ZANAFLEX) 4 MG tablet TAKE 1 TABLET BY MOUTH THREE TIMES A DAY 09/24/22   Ardith Dark, MD  Ubrogepant (UBRELVY) 100 MG TABS Take as directed 08/24/22   Jarold Motto, PA  zolpidem (AMBIEN) 5 MG tablet Take 1 tablet (5 mg total) by mouth at bedtime as needed for sleep. 04/18/21   Ardith Dark, MD      Allergies    Ciprofloxacin    Review of Systems   Review of Systems  Gastrointestinal:  Positive for abdominal pain.    Physical Exam Updated Vital Signs BP (!) 167/111   Pulse 83   Temp 98.6 F (37 C) (Oral)   Resp 18   Ht 5\' 3"  (1.6 m)   Wt 86.2 kg   LMP 12/03/2022 (Approximate)   SpO2 95%   BMI 33.66 kg/m  Physical Exam  ED Results / Procedures / Treatments   Labs (all labs ordered are listed, but only abnormal results are displayed) Labs Reviewed  COMPREHENSIVE METABOLIC PANEL - Abnormal; Notable for the following components:      Result Value   AST 13 (*)    All other components within normal limits  CBC - Abnormal; Notable for the following components:   MCV 79.5 (*)    MCH 24.9 (*)    All other components within normal limits  URINALYSIS, ROUTINE W REFLEX MICROSCOPIC - Abnormal; Notable for the following components:   Color, Urine COLORLESS (*)    Specific Gravity, Urine 1.001 (*)    All other components within normal limits  LIPASE, BLOOD  HCG, SERUM, QUALITATIVE    EKG None  Radiology US PELVIC DOPPLER (TORSION R/O OR MASS ARTERIAL FLOW)  Result Date: 01/16/2023 CLINICAL DATA:  Pelvic pain. EXAM: TRANSVAGINAL ULTRASOUND OF PELVIS DOPPLER ULTRASOUND OF OVARIES TECHNIQUE: Transvaginal ultrasound examination of the pelvis was performed  including evaluation of the uterus, ovaries, adnexal regions, and pelvic cul-de-sac. Color and duplex Doppler ultrasound was utilized to evaluate blood flow to the ovaries. COMPARISON:  CT abdomen and pelvis 01/16/2023 FINDINGS: Uterus Measurements: 7.4 x 3.3 x 2.8 cm = volume: 36 mL. No myometrial mass lesions. Uterus is anteverted. Small nabothian cysts in the cervix. Small amount of fluid in the endocervical canal, likely physiologic. Endometrium Thickness: 9 mm.  No focal abnormality visualized. Right ovary Measurements: 3.8 x 4.4 x 4 cm = volume: 36 mL. A simple appearing cyst is demonstrated in the right ovary measuring 3.9 cm maximal diameter, corresponding to CT finding. No abnormal adnexal masses. Left ovary Left ovary was  not visualized.  No abnormal adnexal masses are seen. Pulsed Doppler evaluation demonstrates normal low-resistance arterial and venous waveforms in the visualized right ovary. Other findings:  No abnormal free fluid IMPRESSION: 1. Simple appearing cysts in the right ovary corresponding to CT finding. Right ovarian benign functional cyst measuring 3.9 cm, if patient is premenopausal (probable benign cyst if patient is postmenopausal). If patient is premenopausal, no follow-up recommended. If patient is postmenopausal, recommend follow-up pelvic ultrasound in 3-6 months. Reference: Radiology 2019 Nov;293(2):359-371 2. Left ovary is not visualized. Electronically Signed   By: Burman Nieves M.D.   On: 01/16/2023 23:25   US PELVIS TRANSVAGINAL NON-OB (TV ONLY)  Result Date: 01/16/2023 CLINICAL DATA:  Pelvic pain. EXAM: TRANSVAGINAL ULTRASOUND OF PELVIS DOPPLER ULTRASOUND OF OVARIES TECHNIQUE: Transvaginal ultrasound examination of the pelvis was performed including evaluation of the uterus, ovaries, adnexal regions, and pelvic cul-de-sac. Color and duplex Doppler ultrasound was utilized to evaluate blood flow to the ovaries. COMPARISON:  CT abdomen and pelvis 01/16/2023 FINDINGS: Uterus  Measurements: 7.4 x 3.3 x 2.8 cm = volume: 36 mL. No myometrial mass lesions. Uterus is anteverted. Small nabothian cysts in the cervix. Small amount of fluid in the endocervical canal, likely physiologic. Endometrium Thickness: 9 mm.  No focal abnormality visualized. Right ovary Measurements: 3.8 x 4.4 x 4 cm = volume: 36 mL. A simple appearing cyst is demonstrated in the right ovary measuring 3.9 cm maximal diameter, corresponding to CT finding. No abnormal adnexal masses. Left ovary Left ovary was not visualized.  No abnormal adnexal masses are seen. Pulsed Doppler evaluation demonstrates normal low-resistance arterial and venous waveforms in the visualized right ovary. Other findings:  No abnormal free fluid IMPRESSION: 1. Simple appearing cysts in the right ovary corresponding to CT finding. Right ovarian benign functional cyst measuring 3.9 cm, if patient is premenopausal (probable benign cyst if patient is postmenopausal). If patient is premenopausal, no follow-up recommended. If patient is postmenopausal, recommend follow-up pelvic ultrasound in 3-6 months. Reference: Radiology 2019 Nov;293(2):359-371 2. Left ovary is not visualized. Electronically Signed   By: Burman Nieves M.D.   On: 01/16/2023 23:25   CT ABDOMEN PELVIS W CONTRAST  Result Date: 01/16/2023 CLINICAL DATA:  Nonlocalized abdominal pain EXAM: CT ABDOMEN AND PELVIS WITH CONTRAST TECHNIQUE: Multidetector CT imaging of the abdomen and pelvis was performed using the standard protocol following bolus administration of intravenous contrast. RADIATION DOSE REDUCTION: This exam was performed according to the departmental dose-optimization program which includes automated exposure control, adjustment of the mA and/or kV according to patient size and/or use of iterative reconstruction technique. CONTRAST:  OMNIPAQUE IOHEXOL 300 MG/ML  SOLN COMPARISON:  CT 11/28/2021 FINDINGS: Lower chest: Lung bases demonstrate no acute airspace disease.  Hepatobiliary: Distended gallbladder without calcified stone. No biliary dilatation or focal hepatic abnormality Pancreas: Unremarkable. No pancreatic ductal dilatation or surrounding inflammatory changes. Spleen: Normal in size without focal abnormality. Adrenals/Urinary Tract: Adrenal glands are normal. No hydronephrosis. Cyst upper pole right kidney, no imaging follow-up is recommended. Bladder is normal Stomach/Bowel: Status post gastric bypass. No bowel wall thickening or obstruction. Negative appendix. Status post sigmoid colon resection and reanastomosis. Vascular/Lymphatic: Mild atherosclerosis. No aneurysm or suspicious lymph nodes Reproductive: Uterus unremarkable. 4.2 cm right adnexal cyst with areas of slight increased internal density consistent with non simple cyst. Other: Negative for pelvic effusion or free air Musculoskeletal: Left hip replacement with artifact. No acute or suspicious osseous lesion IMPRESSION: 1. No CT evidence for acute intra-abdominal or pelvic abnormality. 2. Status  post gastric bypass and sigmoid colon resection with reanastomosis. 3. 4.2 cm right adnexal cyst with areas of slight increased internal density consistent with non simple cyst. Nonemergent pelvic ultrasound is suggested for further assessment Electronically Signed   By: Jasmine Pang M.D.   On: 01/16/2023 21:12    Procedures Procedures  {Document cardiac monitor, telemetry assessment procedure when appropriate:1}  Medications Ordered in ED Medications  HYDROmorphone (DILAUDID) injection 0.5 mg (has no administration in time range)  amLODipine (NORVASC) tablet 5 mg (has no administration in time range)  ondansetron (ZOFRAN) injection 4 mg (4 mg Intravenous Given 01/16/23 1949)  HYDROmorphone (DILAUDID) injection 0.5 mg (0.5 mg Intravenous Given 01/16/23 1949)  iohexol (OMNIPAQUE) 300 MG/ML solution 100 mL (100 mLs Intravenous Contrast Given 01/16/23 2001)  sodium chloride 0.9 % bolus 1,000 mL (1,000 mLs  Intravenous New Bag/Given 01/16/23 2109)  HYDROmorphone (DILAUDID) injection 0.5 mg (0.5 mg Intravenous Given 01/16/23 2136)    ED Course/ Medical Decision Making/ A&P   {   Click here for ABCD2, HEART and other calculatorsREFRESH Note before signing :1}                              Medical Decision Making Amount and/or Complexity of Data Reviewed Labs: ordered. Radiology: ordered.  Risk Prescription drug management.   ***  {Document critical care time when appropriate:1} {Document review of labs and clinical decision tools ie heart score, Chads2Vasc2 etc:1}  {Document your independent review of radiology images, and any outside records:1} {Document your discussion with family members, caretakers, and with consultants:1} {Document social determinants of health affecting pt's care:1} {Document your decision making why or why not admission, treatments were needed:1} Final Clinical Impression(s) / ED Diagnoses Final diagnoses:  Ovarian cyst, right  Generalized abdominal pain    Rx / DC Orders ED Discharge Orders     None

## 2023-01-16 NOTE — Discharge Instructions (Addendum)
Continue current medications.  See your Physician for recheck of your blood pressure

## 2023-01-16 NOTE — ED Triage Notes (Addendum)
Sick since 12/30/2022. Was given antibiotics at urgent care took for 5 days and thought that was the cause of stomach pain and stopped taking them. Diarrhea and food goes straight through. Lost 10 lbs since the 17th. Been getting bad cramps throughout her body. Heart feels like it is beating too fast when she gets up to go to the bathroom in the night. Hx Gastric bypass, spinal damage, and diverticulitis.

## 2023-01-16 NOTE — ED Provider Notes (Signed)
[place] EMERGENCY DEPARTMENT AT [facility] Provider Note   CSN: [id number] Arrival date & time: 01/16/23  1714     History  Chief Complaint  Patient presents with   Abdominal Pain    Heidi Holland is a 47 y.o. female.  Patient complains of abdominal pain.  Patient reports that she has had recent diarrhea.  Patient reports that she has had weight loss.  Patient reports she has had a history of gastric bypass.  Patient has had a bowel resection in the past secondary to diverticulitis.  Patient reports she has been feeling bad since 11/17.  Patient was treated with antibiotics at urgent care for possible PID.  Patient reports that all of her testing came back negative and she stopped the antibiotics.  Patient reports that she has been having abdominal discomfort and diarrhea since.  The history is provided by the patient. No language interpreter was used.  Abdominal Pain Associated symptoms: diarrhea and nausea        Home Medications Prior to Admission medications   Medication Sig Start Date End Date Taking? Authorizing Provider  albuterol (VENTOLIN HFA) 108 (90 Base) MCG/ACT inhaler INHALE 2 PUFFS BY MOUTH EVERY 6 HOURS AS NEEDED FOR WHEEZE OR SHORTNESS OF BREATH 04/10/21   Ardith Dark, MD  amLODipine (NORVASC) 5 MG tablet Take 1 tablet (5 mg total) by mouth daily. 12/30/22   Raspet, Erin K, PA-C  AUVELITY 45-105 MG TBCR Take by mouth. Patient not taking: Reported on 12/30/2022 05/10/22   [provider]  azelastine (ASTELIN) 0.1 % nasal spray Place 2 sprays into both nostrils 2 (two) times daily. 04/27/22   Ardith Dark, MD  blood glucose meter kit and supplies KIT Dispense based on patient and insurance preference. Use up to four times daily as directed. (FOR ICD-9 250.00, 250.01). 07/31/18   Ardith Dark, MD  clonazePAM (KLONOPIN) 0.5 MG tablet Take 1 tablet (0.5 mg total) by mouth 2 (two) times daily as needed. for anxiety 02/20/22   Ardith Dark,  MD  Continuous Blood Gluc Receiver (FREESTYLE LIBRE READER) DEVI 1 Device by Does not apply route daily as needed (to check blood sugar). 11/02/21   Ardith Dark, MD  Continuous Glucose Sensor (FREESTYLE LIBRE SENSOR SYSTEM) MISC Apply 1 sensor to skin every 14 days. 08/24/22   Ardith Dark, MD  doxycycline (VIBRAMYCIN) 100 MG capsule Take 1 capsule (100 mg total) by mouth 2 (two) times daily. 12/30/22   Raspet, Noberto Retort, PA-C  fluconazole (DIFLUCAN) 150 MG tablet Take 1 tablet (150 mg total) by mouth once a week. 12/31/22   Raspet, Noberto Retort, PA-C  HYDROcodone-acetaminophen (NORCO) 10-325 MG tablet Take 1 tablet by mouth every 6 (six) hours as needed. 05/07/22   [provider]  lumateperone tosylate (CAPLYTA) 42 MG capsule  01/05/21   [provider]  MAGNESIUM PO Take by mouth. 02/16/22   [provider]  metFORMIN (GLUCOPHAGE) 1000 MG tablet TAKE 1 TABLET BY MOUTH TWICE A DAY WITH FOOD 10/29/22   Ardith Dark, MD  methylPREDNISolone (MEDROL DOSEPAK) 4 MG TBPK tablet Take as directed Patient not taking: Reported on 12/30/2022 08/24/22   Jarold Motto, PA  metroNIDAZOLE (FLAGYL) 500 MG tablet Take 1 tablet (500 mg total) by mouth 2 (two) times daily. 12/30/22   Raspet, Noberto Retort, PA-C  nystatin (MYCOSTATIN) 100000 UNIT/ML suspension Take 5 mLs (500,000 Units total) by mouth 4 (four) times daily. 12/31/22   Raspet, Noberto Retort,  PA-C  ondansetron (ZOFRAN-ODT) 4 MG disintegrating tablet Take 1 tablet (4 mg total) by mouth every 6 (six) hours as needed for nausea or vomiting. 03/29/22   Unk Lightning, PA  pantoprazole (PROTONIX) 40 MG tablet Take 1 tablet (40 mg total) by mouth daily. TAKE 1 TABLET BY MOUTH 30-60 minutes before breakfast 04/27/22   Ardith Dark, MD  pregabalin (LYRICA) 150 MG capsule TAKE 1 CAPSULE BY MOUTH TWICE A DAY 08/21/21   Ardith Dark, MD  QUEtiapine (SEROQUEL) 100 MG tablet Take by mouth. 05/03/21   [provider]  Rimegepant Sulfate  (NURTEC) 75 MG TBDP Take as directed 08/24/22   Jarold Motto, PA  rosuvastatin (CRESTOR) 20 MG tablet Take 1 tablet (20 mg total) by mouth daily. Patient not taking: Reported on 08/24/2022 11/02/21   Ardith Dark, MD  Semaglutide,0.25 or 0.5MG /DOS, (OZEMPIC, 0.25 OR 0.5 MG/DOSE,) 2 MG/3ML SOPN Inject 0.25-0.5 mg into the skin once a week. 07/24/22   Ardith Dark, MD  SUMAtriptan (IMITREX) 25 MG tablet Take 1 tablet (25 mg total) by mouth every 2 (two) hours as needed for migraine. May repeat in 2 hours if headache persists or recurs. 07/24/22   Ardith Dark, MD  tiZANidine (ZANAFLEX) 4 MG tablet TAKE 1 TABLET BY MOUTH THREE TIMES A DAY 09/24/22   Ardith Dark, MD  Ubrogepant (UBRELVY) 100 MG TABS Take as directed 08/24/22   Jarold Motto, PA  zolpidem (AMBIEN) 5 MG tablet Take 1 tablet (5 mg total) by mouth at bedtime as needed for sleep. 04/18/21   Ardith Dark, MD      Allergies    Ciprofloxacin    Review of Systems   Review of Systems  Gastrointestinal:  Positive for abdominal pain, diarrhea and nausea.  All other systems reviewed and are negative.   Physical Exam Updated Vital Signs BP (!) 167/111   Pulse 83   Temp 98.6 F (37 C) (Oral)   Resp 18   Ht 5\' 3"  (1.6 m)   Wt 86.2 kg   LMP 12/03/2022 (Approximate)   SpO2 95%   BMI 33.66 kg/m  Physical Exam Vitals and nursing note reviewed.  Constitutional:      Appearance: She is well-developed.  HENT:     Head: Normocephalic.  Cardiovascular:     Rate and Rhythm: Normal rate.  Pulmonary:     Effort: Pulmonary effort is normal.  Abdominal:     General: Abdomen is flat. Bowel sounds are normal. There is no distension.     Palpations: Abdomen is soft.     Tenderness: There is abdominal tenderness.  Musculoskeletal:        General: Normal range of motion.     Cervical back: Normal range of motion.  Skin:    General: Skin is warm.  Neurological:     General: No focal deficit present.     Mental Status: She  is alert and oriented to person, place, and time.     ED Results / Procedures / Treatments   Labs (all labs ordered are listed, but only abnormal results are displayed) Labs Reviewed  COMPREHENSIVE METABOLIC PANEL - Abnormal; Notable for the following components:      Result Value   AST 13 (*)    All other components within normal limits  CBC - Abnormal; Notable for the following components:   MCV 79.5 (*)    MCH 24.9 (*)    All other components within normal limits  URINALYSIS, ROUTINE W REFLEX MICROSCOPIC - Abnormal; Notable for the following components:   Color, Urine COLORLESS (*)    Specific Gravity, Urine 1.001 (*)    All other components within normal limits  LIPASE, BLOOD  HCG, SERUM, QUALITATIVE    EKG None  Radiology US PELVIC DOPPLER (TORSION R/O OR MASS ARTERIAL FLOW)  Result Date: 01/16/2023 CLINICAL DATA:  Pelvic pain. EXAM: TRANSVAGINAL ULTRASOUND OF PELVIS DOPPLER ULTRASOUND OF OVARIES TECHNIQUE: Transvaginal ultrasound examination of the pelvis was performed including evaluation of the uterus, ovaries, adnexal regions, and pelvic cul-de-sac. Color and duplex Doppler ultrasound was utilized to evaluate blood flow to the ovaries. COMPARISON:  CT abdomen and pelvis 01/16/2023 FINDINGS: Uterus Measurements: 7.4 x 3.3 x 2.8 cm = volume: 36 mL. No myometrial mass lesions. Uterus is anteverted. Small nabothian cysts in the cervix. Small amount of fluid in the endocervical canal, likely physiologic. Endometrium Thickness: 9 mm.  No focal abnormality visualized. Right ovary Measurements: 3.8 x 4.4 x 4 cm = volume: 36 mL. A simple appearing cyst is demonstrated in the right ovary measuring 3.9 cm maximal diameter, corresponding to CT finding. No abnormal adnexal masses. Left ovary Left ovary was not visualized.  No abnormal adnexal masses are seen. Pulsed Doppler evaluation demonstrates normal low-resistance arterial and venous waveforms in the visualized right ovary. Other  findings:  No abnormal free fluid IMPRESSION: 1. Simple appearing cysts in the right ovary corresponding to CT finding. Right ovarian benign functional cyst measuring 3.9 cm, if patient is premenopausal (probable benign cyst if patient is postmenopausal). If patient is premenopausal, no follow-up recommended. If patient is postmenopausal, recommend follow-up pelvic ultrasound in 3-6 months. Reference: Radiology 2019 Nov;293(2):359-371 2. Left ovary is not visualized. Electronically Signed   By: Burman Nieves M.D.   On: 01/16/2023 23:25   US PELVIS TRANSVAGINAL NON-OB (TV ONLY)  Result Date: 01/16/2023 CLINICAL DATA:  Pelvic pain. EXAM: TRANSVAGINAL ULTRASOUND OF PELVIS DOPPLER ULTRASOUND OF OVARIES TECHNIQUE: Transvaginal ultrasound examination of the pelvis was performed including evaluation of the uterus, ovaries, adnexal regions, and pelvic cul-de-sac. Color and duplex Doppler ultrasound was utilized to evaluate blood flow to the ovaries. COMPARISON:  CT abdomen and pelvis 01/16/2023 FINDINGS: Uterus Measurements: 7.4 x 3.3 x 2.8 cm = volume: 36 mL. No myometrial mass lesions. Uterus is anteverted. Small nabothian cysts in the cervix. Small amount of fluid in the endocervical canal, likely physiologic. Endometrium Thickness: 9 mm.  No focal abnormality visualized. Right ovary Measurements: 3.8 x 4.4 x 4 cm = volume: 36 mL. A simple appearing cyst is demonstrated in the right ovary measuring 3.9 cm maximal diameter, corresponding to CT finding. No abnormal adnexal masses. Left ovary Left ovary was not visualized.  No abnormal adnexal masses are seen. Pulsed Doppler evaluation demonstrates normal low-resistance arterial and venous waveforms in the visualized right ovary. Other findings:  No abnormal free fluid IMPRESSION: 1. Simple appearing cysts in the right ovary corresponding to CT finding. Right ovarian benign functional cyst measuring 3.9 cm, if patient is premenopausal (probable benign cyst if patient  is postmenopausal). If patient is premenopausal, no follow-up recommended. If patient is postmenopausal, recommend follow-up pelvic ultrasound in 3-6 months. Reference: Radiology 2019 Nov;293(2):359-371 2. Left ovary is not visualized. Electronically Signed   By: Burman Nieves M.D.   On: 01/16/2023 23:25   CT ABDOMEN PELVIS W CONTRAST  Result Date: 01/16/2023 CLINICAL DATA:  Nonlocalized abdominal pain EXAM: CT ABDOMEN AND PELVIS WITH CONTRAST TECHNIQUE: Multidetector CT imaging of the abdomen and  pelvis was performed using the standard protocol following bolus administration of intravenous contrast. RADIATION DOSE REDUCTION: This exam was performed according to the departmental dose-optimization program which includes automated exposure control, adjustment of the mA and/or kV according to patient size and/or use of iterative reconstruction technique. CONTRAST:  OMNIPAQUE IOHEXOL 300 MG/ML  SOLN COMPARISON:  CT 11/28/2021 FINDINGS: Lower chest: Lung bases demonstrate no acute airspace disease. Hepatobiliary: Distended gallbladder without calcified stone. No biliary dilatation or focal hepatic abnormality Pancreas: Unremarkable. No pancreatic ductal dilatation or surrounding inflammatory changes. Spleen: Normal in size without focal abnormality. Adrenals/Urinary Tract: Adrenal glands are normal. No hydronephrosis. Cyst upper pole right kidney, no imaging follow-up is recommended. Bladder is normal Stomach/Bowel: Status post gastric bypass. No bowel wall thickening or obstruction. Negative appendix. Status post sigmoid colon resection and reanastomosis. Vascular/Lymphatic: Mild atherosclerosis. No aneurysm or suspicious lymph nodes Reproductive: Uterus unremarkable. 4.2 cm right adnexal cyst with areas of slight increased internal density consistent with non simple cyst. Other: Negative for pelvic effusion or free air Musculoskeletal: Left hip replacement with artifact. No acute or suspicious osseous  lesion IMPRESSION: 1. No CT evidence for acute intra-abdominal or pelvic abnormality. 2. Status post gastric bypass and sigmoid colon resection with reanastomosis. 3. 4.2 cm right adnexal cyst with areas of slight increased internal density consistent with non simple cyst. Nonemergent pelvic ultrasound is suggested for further assessment Electronically Signed   By: Jasmine Pang M.D.   On: 01/16/2023 21:12    Procedures Procedures    Medications Ordered in ED Medications  HYDROmorphone (DILAUDID) injection 0.5 mg (has no administration in time range)  amLODipine (NORVASC) tablet 5 mg (has no administration in time range)  ondansetron (ZOFRAN) injection 4 mg (4 mg Intravenous Given 01/16/23 1949)  HYDROmorphone (DILAUDID) injection 0.5 mg (0.5 mg Intravenous Given 01/16/23 1949)  iohexol (OMNIPAQUE) 300 MG/ML solution 100 mL (100 mLs Intravenous Contrast Given 01/16/23 2001)  sodium chloride 0.9 % bolus 1,000 mL (1,000 mLs Intravenous New Bag/Given 01/16/23 2109)  HYDROmorphone (DILAUDID) injection 0.5 mg (0.5 mg Intravenous Given 01/16/23 2136)    ED Course/ Medical Decision Making/ A&P                                 Medical Decision Making Patient reports she has been feeling bad since mid November.  Patient reports she was treated in November for possible PID.  Patient was on antibiotics for 5 days and stopped because she had negative test.  She reports that she has continued to have some abdominal discomfort she has a past medical history of diverticulitis  Amount and/or Complexity of Data Reviewed Labs: ordered. Decision-making details documented in ED Course.    Details: UA is negative CBC and chemistry showed no acute changes Radiology: ordered and independent interpretation performed. Decision-making details documented in ED Course.    Details: The abdomen and pelvis ordered reviewed and interpreted.  Patient has no acute findings.  Risk Prescription drug management. Risk Details:  Given IV fluids IV pain medication and IV Zofran.  Patient reports some relief after medications.  I discussed with patient her results I have advised that she follow-up with her primary care physician for recheck.  Patient is discharged in stable condition           Final Clinical Impression(s) / ED Diagnoses Final diagnoses:  Ovarian cyst, right  Generalized abdominal pain    Rx / DC Orders ED  Discharge Orders     None     An After Visit Summary was printed and given to the patient.     Elson Areas, PA-C 01/17/23 1541    Rozelle Logan, DO 01/17/23 2110

## 2023-01-17 DIAGNOSIS — M25552 Pain in left hip: Secondary | ICD-10-CM | POA: Diagnosis not present

## 2023-01-17 DIAGNOSIS — M5416 Radiculopathy, lumbar region: Secondary | ICD-10-CM | POA: Diagnosis not present

## 2023-01-17 DIAGNOSIS — Z79899 Other long term (current) drug therapy: Secondary | ICD-10-CM | POA: Diagnosis not present

## 2023-01-17 DIAGNOSIS — G894 Chronic pain syndrome: Secondary | ICD-10-CM | POA: Diagnosis not present

## 2023-01-17 DIAGNOSIS — M7918 Myalgia, other site: Secondary | ICD-10-CM | POA: Diagnosis not present

## 2023-01-17 DIAGNOSIS — M5417 Radiculopathy, lumbosacral region: Secondary | ICD-10-CM | POA: Diagnosis not present

## 2023-01-18 DIAGNOSIS — F5102 Adjustment insomnia: Secondary | ICD-10-CM | POA: Diagnosis not present

## 2023-01-18 DIAGNOSIS — F4310 Post-traumatic stress disorder, unspecified: Secondary | ICD-10-CM | POA: Diagnosis not present

## 2023-01-18 DIAGNOSIS — F410 Panic disorder [episodic paroxysmal anxiety] without agoraphobia: Secondary | ICD-10-CM | POA: Diagnosis not present

## 2023-01-18 DIAGNOSIS — F3131 Bipolar disorder, current episode depressed, mild: Secondary | ICD-10-CM | POA: Diagnosis not present

## 2023-01-18 DIAGNOSIS — F411 Generalized anxiety disorder: Secondary | ICD-10-CM | POA: Diagnosis not present

## 2023-01-21 ENCOUNTER — Ambulatory Visit: Payer: Medicaid Other | Admitting: Family Medicine

## 2023-01-24 ENCOUNTER — Other Ambulatory Visit: Payer: Self-pay | Admitting: Family Medicine

## 2023-01-29 DIAGNOSIS — F411 Generalized anxiety disorder: Secondary | ICD-10-CM | POA: Diagnosis not present

## 2023-01-29 DIAGNOSIS — F5102 Adjustment insomnia: Secondary | ICD-10-CM | POA: Diagnosis not present

## 2023-01-29 DIAGNOSIS — F410 Panic disorder [episodic paroxysmal anxiety] without agoraphobia: Secondary | ICD-10-CM | POA: Diagnosis not present

## 2023-01-29 DIAGNOSIS — F4310 Post-traumatic stress disorder, unspecified: Secondary | ICD-10-CM | POA: Diagnosis not present

## 2023-01-29 DIAGNOSIS — F3131 Bipolar disorder, current episode depressed, mild: Secondary | ICD-10-CM | POA: Diagnosis not present

## 2023-02-01 ENCOUNTER — Ambulatory Visit: Payer: Medicaid Other | Admitting: Family Medicine

## 2023-02-11 ENCOUNTER — Telehealth: Payer: Medicaid Other | Admitting: Family Medicine

## 2023-02-11 ENCOUNTER — Encounter: Payer: Medicaid Other | Admitting: Family Medicine

## 2023-02-11 ENCOUNTER — Ambulatory Visit (HOSPITAL_COMMUNITY): Payer: Medicaid Other

## 2023-02-11 DIAGNOSIS — B9689 Other specified bacterial agents as the cause of diseases classified elsewhere: Secondary | ICD-10-CM | POA: Diagnosis not present

## 2023-02-11 DIAGNOSIS — T3695XA Adverse effect of unspecified systemic antibiotic, initial encounter: Secondary | ICD-10-CM | POA: Diagnosis not present

## 2023-02-11 DIAGNOSIS — B379 Candidiasis, unspecified: Secondary | ICD-10-CM | POA: Diagnosis not present

## 2023-02-11 DIAGNOSIS — J019 Acute sinusitis, unspecified: Secondary | ICD-10-CM | POA: Diagnosis not present

## 2023-02-11 MED ORDER — AMOXICILLIN-POT CLAVULANATE 875-125 MG PO TABS: 1.0000 | ORAL_TABLET | Freq: Two times a day (BID) | ORAL | 0 refills | Status: AC

## 2023-02-11 MED ORDER — PREDNISONE 20 MG PO TABS: 40.0000 mg | ORAL_TABLET | Freq: Every day | ORAL | 0 refills | Status: AC

## 2023-02-11 MED ORDER — FLUCONAZOLE 150 MG PO TABS: 150.0000 mg | ORAL_TABLET | Freq: Once | ORAL | 0 refills | Status: AC

## 2023-02-11 NOTE — Progress Notes (Signed)
E-Visit for Sinus Problems  We are sorry that you are not feeling well.  Here is how we plan to help!  Based on what you have shared with me it looks like you have sinusitis.  Sinusitis is inflammation and infection in the sinus cavities of the head.  Based on your presentation I believe you most likely have Acute Bacterial Sinusitis.  This is an infection caused by bacteria and is treated with antibiotics. I have prescribed Augmentin 875mg/125mg one tablet twice daily with food, for 7 days. You may use an oral decongestant such as Mucinex D or if you have glaucoma or high blood pressure use plain Mucinex. Saline nasal spray help and can safely be used as often as needed for congestion.  If you develop worsening sinus pain, fever or notice severe headache and vision changes, or if symptoms are not better after completion of antibiotic, please schedule an appointment with a health care provider.    Sinus infections are not as easily transmitted as other respiratory infection, however we still recommend that you avoid close contact with loved ones, especially the very young and elderly.  Remember to wash your hands thoroughly throughout the day as this is the number one way to prevent the spread of infection!  Home Care: Only take medications as instructed by your medical team. Complete the entire course of an antibiotic. Do not take these medications with alcohol. A steam or ultrasonic humidifier can help congestion.  You can place a towel over your head and breathe in the steam from hot water coming from a faucet. Avoid close contacts especially the very young and the elderly. Cover your mouth when you cough or sneeze. Always remember to wash your hands.  Get Help Right Away If: You develop worsening fever or sinus pain. You develop a severe head ache or visual changes. Your symptoms persist after you have completed your treatment plan.  Make sure you Understand these instructions. Will watch  your condition. Will get help right away if you are not doing well or get worse.  Thank you for choosing an e-visit.  Your e-visit answers were reviewed by a board certified advanced clinical practitioner to complete your personal care plan. Depending upon the condition, your plan could have included both over the counter or prescription medications.  Please review your pharmacy choice. Make sure the pharmacy is open so you can pick up prescription now. If there is a problem, you may contact your provider through MyChart messaging and have the prescription routed to another pharmacy.  Your safety is important to us. If you have drug allergies check your prescription carefully.   For the next 24 hours you can use MyChart to ask questions about today's visit, request a non-urgent call back, or ask for a work or school excuse. You will get an email in the next two days asking about your experience. I hope that your e-visit has been valuable and will speed your recovery.  I provided 5 minutes of non face-to-face time during this encounter for chart review, medication and order placement, as well as and documentation.   

## 2023-02-11 NOTE — Addendum Note (Signed)
Addended by: Freddy Finner on: 02/11/2023 02:50 PM   Modules accepted: Orders

## 2023-02-11 NOTE — Progress Notes (Signed)
Duplicate

## 2023-02-12 ENCOUNTER — Ambulatory Visit: Payer: Medicaid Other | Admitting: Family Medicine

## 2023-02-12 ENCOUNTER — Ambulatory Visit: Payer: 59 | Admitting: Dermatology

## 2023-02-12 ENCOUNTER — Telehealth: Payer: Medicaid Other

## 2023-02-20 ENCOUNTER — Encounter: Payer: Self-pay | Admitting: Family Medicine

## 2023-02-20 ENCOUNTER — Other Ambulatory Visit (HOSPITAL_COMMUNITY): Payer: Self-pay

## 2023-02-20 ENCOUNTER — Telehealth: Payer: Self-pay

## 2023-02-20 ENCOUNTER — Other Ambulatory Visit: Payer: Self-pay | Admitting: Family Medicine

## 2023-02-20 ENCOUNTER — Ambulatory Visit: Payer: Medicaid Other | Admitting: Family Medicine

## 2023-02-20 VITALS — BP 138/83 | HR 93 | Temp 97.0°F | Ht 63.0 in | Wt 199.4 lb

## 2023-02-20 DIAGNOSIS — J4520 Mild intermittent asthma, uncomplicated: Secondary | ICD-10-CM

## 2023-02-20 DIAGNOSIS — N83209 Unspecified ovarian cyst, unspecified side: Secondary | ICD-10-CM

## 2023-02-20 DIAGNOSIS — E538 Deficiency of other specified B group vitamins: Secondary | ICD-10-CM | POA: Diagnosis not present

## 2023-02-20 DIAGNOSIS — E1169 Type 2 diabetes mellitus with other specified complication: Secondary | ICD-10-CM | POA: Diagnosis not present

## 2023-02-20 DIAGNOSIS — E785 Hyperlipidemia, unspecified: Secondary | ICD-10-CM | POA: Diagnosis not present

## 2023-02-20 DIAGNOSIS — F419 Anxiety disorder, unspecified: Secondary | ICD-10-CM

## 2023-02-20 DIAGNOSIS — D509 Iron deficiency anemia, unspecified: Secondary | ICD-10-CM

## 2023-02-20 DIAGNOSIS — Z9884 Bariatric surgery status: Secondary | ICD-10-CM

## 2023-02-20 DIAGNOSIS — Z8249 Family history of ischemic heart disease and other diseases of the circulatory system: Secondary | ICD-10-CM

## 2023-02-20 DIAGNOSIS — J309 Allergic rhinitis, unspecified: Secondary | ICD-10-CM | POA: Diagnosis not present

## 2023-02-20 DIAGNOSIS — I10 Essential (primary) hypertension: Secondary | ICD-10-CM

## 2023-02-20 DIAGNOSIS — G4730 Sleep apnea, unspecified: Secondary | ICD-10-CM | POA: Diagnosis not present

## 2023-02-20 DIAGNOSIS — E119 Type 2 diabetes mellitus without complications: Secondary | ICD-10-CM

## 2023-02-20 DIAGNOSIS — M2550 Pain in unspecified joint: Secondary | ICD-10-CM

## 2023-02-20 DIAGNOSIS — F325 Major depressive disorder, single episode, in full remission: Secondary | ICD-10-CM

## 2023-02-20 DIAGNOSIS — Z7985 Long-term (current) use of injectable non-insulin antidiabetic drugs: Secondary | ICD-10-CM | POA: Diagnosis not present

## 2023-02-20 DIAGNOSIS — Z1231 Encounter for screening mammogram for malignant neoplasm of breast: Secondary | ICD-10-CM

## 2023-02-20 LAB — IBC + FERRITIN
Ferritin: 5.9 ng/mL — ABNORMAL LOW (ref 10.0–291.0)
Iron: 24 ug/dL — ABNORMAL LOW (ref 42–145)
Saturation Ratios: 5.2 % — ABNORMAL LOW (ref 20.0–50.0)
TIBC: 462 ug/dL — ABNORMAL HIGH (ref 250.0–450.0)
Transferrin: 330 mg/dL (ref 212.0–360.0)

## 2023-02-20 LAB — COMPREHENSIVE METABOLIC PANEL
ALT: 10 U/L (ref 0–35)
AST: 11 U/L (ref 0–37)
Albumin: 4.2 g/dL (ref 3.5–5.2)
Alkaline Phosphatase: 73 U/L (ref 39–117)
BUN: 11 mg/dL (ref 6–23)
CO2: 26 meq/L (ref 19–32)
Calcium: 9 mg/dL (ref 8.4–10.5)
Chloride: 101 meq/L (ref 96–112)
Creatinine, Ser: 0.63 mg/dL (ref 0.40–1.20)
GFR: 105.63 mL/min (ref >60.00)
Glucose, Bld: 101 mg/dL — ABNORMAL HIGH (ref 70–99)
Potassium: 3.9 meq/L (ref 3.5–5.1)
Sodium: 136 meq/L (ref 135–145)
Total Bilirubin: 0.2 mg/dL (ref 0.2–1.2)
Total Protein: 6.8 g/dL (ref 6.0–8.3)

## 2023-02-20 LAB — LIPID PANEL
Cholesterol: 207 mg/dL — ABNORMAL HIGH (ref 0–200)
HDL: 52.1 mg/dL (ref >39.00)
LDL Cholesterol: 121 mg/dL — ABNORMAL HIGH (ref 0–99)
NonHDL: 155.27
Total CHOL/HDL Ratio: 4
Triglycerides: 171 mg/dL — ABNORMAL HIGH (ref 0.0–149.0)
VLDL: 34.2 mg/dL (ref 0.0–40.0)

## 2023-02-20 LAB — SEDIMENTATION RATE: Sed Rate: 27 mm/h — ABNORMAL HIGH (ref 0–20)

## 2023-02-20 LAB — CBC
HCT: 35.6 % — ABNORMAL LOW (ref 36.0–46.0)
Hemoglobin: 11.3 g/dL — ABNORMAL LOW (ref 12.0–15.0)
MCHC: 31.9 g/dL (ref 30.0–36.0)
MCV: 76.7 fL — ABNORMAL LOW (ref 78.0–100.0)
Platelets: 425 10*3/uL — ABNORMAL HIGH (ref 150.0–400.0)
RBC: 4.64 Mil/uL (ref 3.87–5.11)
RDW: 15.9 % — ABNORMAL HIGH (ref 11.5–15.5)
WBC: 15.7 10*3/uL — ABNORMAL HIGH (ref 4.0–10.5)

## 2023-02-20 LAB — C-REACTIVE PROTEIN: CRP: 1.5 mg/dL (ref 0.5–20.0)

## 2023-02-20 LAB — T3, FREE: T3, Free: 2.5 pg/mL (ref 2.3–4.2)

## 2023-02-20 LAB — CK: Total CK: 19 U/L (ref 7–177)

## 2023-02-20 LAB — VITAMIN D 25 HYDROXY (VIT D DEFICIENCY, FRACTURES): VITD: 11.79 ng/mL — ABNORMAL LOW (ref 30.00–100.00)

## 2023-02-20 LAB — HEMOGLOBIN A1C: Hgb A1c MFr Bld: 7.4 % — ABNORMAL HIGH (ref 4.6–6.5)

## 2023-02-20 LAB — T4, FREE: Free T4: 0.72 ng/dL (ref 0.60–1.60)

## 2023-02-20 LAB — VITAMIN B12: Vitamin B-12: 480 pg/mL (ref 211–911)

## 2023-02-20 LAB — TSH: TSH: 0.93 u[IU]/mL (ref 0.35–5.50)

## 2023-02-20 MED ORDER — FREESTYLE LIBRE SENSOR SYSTEM MISC: 11 refills | Status: DC

## 2023-02-20 MED ORDER — BLOOD PRESSURE CUFF MISC: 0 refills | Status: DC

## 2023-02-20 MED ORDER — TIRZEPATIDE 2.5 MG/0.5ML ~~LOC~~ SOAJ: 2.5000 mg | SUBCUTANEOUS | 0 refills | Status: DC

## 2023-02-20 MED ORDER — ALBUTEROL SULFATE HFA 108 (90 BASE) MCG/ACT IN AERS: 1.0000 | INHALATION_SPRAY | Freq: Four times a day (QID) | RESPIRATORY_TRACT | 2 refills | Status: DC | PRN

## 2023-02-20 MED ORDER — FREESTYLE LIBRE READER DEVI: 1.0000 | Freq: Every day | 0 refills | Status: DC | PRN

## 2023-02-20 MED ORDER — AZELASTINE HCL 0.1 % NA SOLN: 2.0000 | Freq: Two times a day (BID) | NASAL | 12 refills | Status: DC

## 2023-02-20 MED ORDER — PREDNISONE 50 MG PO TABS: ORAL_TABLET | ORAL | 0 refills | Status: DC

## 2023-02-20 NOTE — Telephone Encounter (Signed)
Please start a PA for [facility].      Thanks!

## 2023-02-20 NOTE — Assessment & Plan Note (Signed)
Stable. Management per psychiatry.   

## 2023-02-20 NOTE — Assessment & Plan Note (Signed)
 She was recently started on amlodipine  5 mg daily at urgent care.  She has been tolerating well.  Was initially mildly elevated however at goal on recheck.  She will continue to monitor at home and let us  know if persistently elevated.  Will continue amlodipine  5 mg daily.

## 2023-02-20 NOTE — Assessment & Plan Note (Signed)
 Restart Astelin.

## 2023-02-20 NOTE — Assessment & Plan Note (Signed)
 Will place referral to cardiology per patient request.  Does have significant personal risk factors as well including dyslipidemia, essential hypertension, and type 2 diabetes.

## 2023-02-20 NOTE — Assessment & Plan Note (Signed)
 Found while in the ED last month.  Likely contributing to her abdominal pain.  Will place referral for her to see gynecology.

## 2023-02-20 NOTE — Assessment & Plan Note (Signed)
 Continue management per psychiatry.

## 2023-02-20 NOTE — Assessment & Plan Note (Signed)
Stable on albuterol as needed.  Will refill today. 

## 2023-02-20 NOTE — Telephone Encounter (Signed)
 Pharmacy Patient Advocate Encounter   Received notification from RX Request Messages that prior authorization for Mounjaro  2.5mg /0.19ml is required/requested.   Insurance verification completed.   The patient is insured through [facility] MEDICAID .   Per test claim: PA required; PA submitted to above mentioned insurance via CoverMyMeds Key/confirmation #/EOC [facility] Status is pending

## 2023-02-20 NOTE — Progress Notes (Signed)
 Heidi Holland is a 48 y.o. female who presents today for an office visit.  Assessment/Plan:  New/Acute Problems: Sinusitis  No red flags.  She is finished her course of Augmentin .  Will give her a few more days of prednisone .  She has done well this previously.  She will let us  know if symptoms do not continue to improve.  Chronic Problems Addressed Today: Essential hypertension She was recently started on amlodipine  5 mg daily at urgent care.  She has been tolerating well.  Was initially mildly elevated however at goal on recheck.  She will continue to monitor at home and let us  know if persistently elevated.  Will continue amlodipine  5 mg daily.  Polyarthralgia This is still an ongoing issue.  She would like to see rheumatology.  She had previously been seeing Dr. Bonner at [facility].  She is concerned about potential autoimmune condition or fibromyalgia.  She was referred to rheumatology a few months ago however was not able to see them due to losing her insurance.  We gave her contact information and she will call to schedule clinic today.  Will check labs today per patient request.  Iron deficiency anemia Check labs including CBC and iron panel.  T2DM (type 2 diabetes mellitus) (HCC) Check A1c.  We will restart Mounjaro  2.5 mg weekly.  She has done all this previously.  She will follow-up with me in a few weeks and we can titrate the dose as needed.  Will likely recheck A1c in 3 to 6 months.  She will continue metformin  1000 mg twice daily.  Dyslipidemia associated with type 2 diabetes mellitus (HCC) On Crestor  20 mg daily.  Tolerating well.  Check lipids.  Allergic rhinitis Restart Astelin .  Mild intermittent asthma, uncomplicated Stable on albuterol  as needed.  Will refill today.  B12 deficiency Check B12.  Family history of early CAD Will place referral to cardiology per patient request.  Does have significant personal risk factors as well including dyslipidemia,  essential hypertension, and type 2 diabetes.   Ovarian cyst Found while in the ED last month.  Likely contributing to her abdominal pain.  Will place referral for her to see gynecology.  Anxiety Continue management per psychiatry.  Depression, major, single episode, complete remission (HCC) Stable.  Management per psychiatry.     Subjective:  HPI:  See A/P for status of chronic conditions.  Patient is here today for follow-up.  I last saw her about 6 months ago.  At that time A1c was 7.0 on metformin  1000 mg twice daily.  She has multiple issues that she would like to discuss today.  Since her last visit she has been in the ED a couple times with abdominal pain.  Most recently had an ultrasound which showed ovarian cyst.  She still has persistent lower abdominal pain is interested in seeing a gynecologist as she thinks that her cyst may be contributing to this.  She has also had some issues with elevated blood pressure the last couple of months as well.  This was noted while at urgent care a couple of months ago.  They started her on amlodipine  5 mg daily.  She has been tolerating well.  She has not been able to check her blood pressure at home due to not having a cough at home however does note that her blood pressure was elevated when she was in the ED about a month or so ago.  She has had ongoing and continued issues with fatigue and  polyarthralgia.  This does seem to be worsening.  She has previously seen Dr. Bonner at [facility] however she was referred to rheumatology several months ago.  She lost her insurance and was not able to see them but she would like to be referred again.  She is concerned about sleep apnea as well.  She has been diagnosed with this in the past.  She has ongoing fatigue issues above and would like to be rechecked again.  Not currently on CPAP.  She will like to have blood work performed today.  Recently was notified that her water supply may have been contaminated  by lab.  She would like to have this checked.  Would also like to have her inflammatory markers and other basic labs checked as well.  She is interested in restarting Mounjaro .  She was on this previously about a year or so ago however had to discontinue due to insurance no longer paying for this.  She believes her new insurance would pay for this.  Previously did well.  She was also recently diagnosed with a sinus infection.  Had a virtual visit with a different provider about a week or so ago.  Was prescribed Augmentin  and prednisone .  Symptoms have improved though still having a lot of congestion.  She request a few additional days of prednisone .  She has done well with this in the past previously.       Objective:  Physical Exam: BP 138/83   Pulse 93   Temp (!) 97 F (36.1 C) (Temporal)   Ht 5' 3 (1.6 m)   Wt 199 lb 6.4 oz (90.4 kg)   SpO2 97%   BMI 35.32 kg/m   Gen: No acute distress, resting comfortably HEENT: TMs clear effusion. CV: Regular rate and rhythm with no murmurs appreciated Pulm: Normal work of breathing, clear to auscultation bilaterally with no crackles, wheezes, or rhonchi Neuro: Grossly normal, moves all extremities Psych: Normal affect and thought content   Time Spent: 45 minutes of total time was spent on the date of the encounter performing the following actions: chart review prior to seeing the patient including recent Emergency Department visit, obtaining history, performing a medically necessary exam, counseling on the treatment plan, placing orders, and documenting in our EHR.        Worth HERO. Kennyth, MD 02/20/2023 9:36 AM

## 2023-02-20 NOTE — Patient Instructions (Addendum)
 It was very nice to see you today!  We will check blood work today.  Please call the rheumatologist at [phone].  Please start the extra prednisone  to help with your sinus infection.  We will refer you to see the cardiologist, gynecologist and also for a sleep study.  Please monitor your blood pressure at home and let us  know if persistently elevated over the next several weeks.  We will send a prescription for freestyle libre.  Please start the Mounjaro .  Send me a message in a few weeks to let us  know how you are doing.   Return in about 3 months (around 05/21/2023).   Take care, Dr Kennyth  PLEASE NOTE:  If you had any lab tests, please let us  know if you have not heard back within a few days. You may see your results on mychart before we have a chance to review them but we will give you a call once they are reviewed by us .   If we ordered any referrals today, please let us  know if you have not heard from their office within the next week.   If you had any urgent prescriptions sent in today, please check with the pharmacy within an hour of our visit to make sure the prescription was transmitted appropriately.   Please try these tips to maintain a healthy lifestyle:  Eat at least 3 REAL meals and 1-2 snacks per day.  Aim for no more than 5 hours between eating.  If you eat breakfast, please do so within one hour of getting up.   Each meal should contain half fruits/vegetables, one quarter protein, and one quarter carbs (no bigger than a computer mouse)  Cut down on sweet beverages. This includes juice, soda, and sweet tea.   Drink at least 1 glass of water with each meal and aim for at least 8 glasses per day  Exercise at least 150 minutes every week.

## 2023-02-20 NOTE — Assessment & Plan Note (Addendum)
 On Crestor 20 mg daily.  Tolerating well.  Check lipids.

## 2023-02-20 NOTE — Telephone Encounter (Signed)
 Pharmacy Patient Advocate Encounter  Received notification from [facility] MEDICAID that Prior Authorization for Mounjaro  2.5MG /0.5ML auto-injectors has been APPROVED from 02/20/2023 to 02/20/2024. Ran test claim, Copay is $4.00. This test claim was processed through [facility]- copay amounts may vary at other pharmacies due to pharmacy/plan contracts, or as the patient moves through the different stages of their insurance plan.   PA #/Case ID/Reference #: [id number]

## 2023-02-20 NOTE — Assessment & Plan Note (Signed)
 Check B12

## 2023-02-20 NOTE — Assessment & Plan Note (Signed)
 Check labs including CBC and iron panel.

## 2023-02-20 NOTE — Assessment & Plan Note (Signed)
 This is still an ongoing issue.  She would like to see rheumatology.  She had previously been seeing Dr. Bonner at [facility].  She is concerned about potential autoimmune condition or fibromyalgia.  She was referred to rheumatology a few months ago however was not able to see them due to losing her insurance.  We gave her contact information and she will call to schedule clinic today.  Will check labs today per patient request.

## 2023-02-20 NOTE — Assessment & Plan Note (Signed)
 Check A1c.  We will restart Mounjaro  2.5 mg weekly.  She has done all this previously.  She will follow-up with me in a few weeks and we can titrate the dose as needed.  Will likely recheck A1c in 3 to 6 months.  She will continue metformin  1000 mg twice daily.

## 2023-02-21 ENCOUNTER — Telehealth: Payer: Self-pay | Admitting: *Deleted

## 2023-02-21 NOTE — Telephone Encounter (Signed)
 Copied from CRM [id number]. Topic: General - Other >> Feb 21, 2023 10:44 AM Benton KIDD wrote: Reason for CRM: patient is calling cause she was seen yesterday and the doctor was suppose to send over a antibiotic yesterday and  didn't. And medication for a yeast infection is needed   Please advice

## 2023-02-21 NOTE — Telephone Encounter (Signed)
 Patient notified

## 2023-02-21 NOTE — Telephone Encounter (Signed)
Unable to contact patient voice mail is full  °

## 2023-02-21 NOTE — Telephone Encounter (Signed)
 Can we clarify? We sent in the prednisone - we didn't discuss sending in more Augmentin however it is ok to refill once if needed. Also ok to send in diflucan 150 mg as a one time dose if needed as well.

## 2023-02-21 NOTE — Progress Notes (Signed)
 Her white blood cell count is elevated likely due to recent infection and steroids.  We should recheck this again in a couple of weeks.  Please place future order.  A1c is above goal at 7.4.  This should improve with the Mounjaro .  I would like to see her back in 3 months to recheck.  She should let us  know how she is doing in a few weeks with Mounjaro  and we can adjust the dose as needed.  Her vitamin D  is low.  Recommend starting 50,000 IUs once weekly.  Please send a prescription for this.  We should recheck again in 3 to 6 months.  Her iron levels are low and she is slightly anemic.  Recommend she take ferrous sulfate 65 mg every other day on an empty stomach.  We should also recheck this in 3 to 6 months.  Her inflammatory marker is high however this may be due to recent infection.  She can discuss further with rheumatology.  Rest of her labs are all at goal.

## 2023-02-22 ENCOUNTER — Other Ambulatory Visit: Payer: Self-pay | Admitting: *Deleted

## 2023-02-22 ENCOUNTER — Telehealth: Payer: Self-pay | Admitting: *Deleted

## 2023-02-22 DIAGNOSIS — J0110 Acute frontal sinusitis, unspecified: Secondary | ICD-10-CM

## 2023-02-22 LAB — LEAD, BLOOD (ADULT >= 16 YRS): Lead: <1 ug/dL (ref <3.5)

## 2023-02-22 MED ORDER — FLUCONAZOLE 150 MG PO TABS: 150.0000 mg | ORAL_TABLET | Freq: Once | ORAL | 0 refills | Status: AC

## 2023-02-22 MED ORDER — PREDNISONE 50 MG PO TABS: 50.0000 mg | ORAL_TABLET | Freq: Every day | ORAL | 0 refills | Status: DC

## 2023-02-22 MED ORDER — AMOXICILLIN-POT CLAVULANATE 875-125 MG PO TABS: 1.0000 | ORAL_TABLET | Freq: Two times a day (BID) | ORAL | 0 refills | Status: AC

## 2023-02-22 NOTE — Telephone Encounter (Signed)
 Copied from CRM [id number]. Topic: Clinical - Medication Question >> Feb 22, 2023 12:21 PM Powell B wrote: Reason for CRM: Patient calling to get status of PA needed for [facility] monitor. Also returning phone call from PCP, would like the Augmentin  and Diflucan  sent to pharmacy if needed.   Please advise

## 2023-02-22 NOTE — Telephone Encounter (Signed)
 Rx send to [facility] pharmacy

## 2023-02-22 NOTE — Telephone Encounter (Signed)
 Please see previous note - ok to send in Augmentin and prednisone.  Will have to check with PA team regarding her freestyle libre.  [name]. [name], MD 02/22/2023 1:10 PM

## 2023-02-23 LAB — ANTI-NUCLEAR AB-TITER (ANA TITER): ANA Titer 1: 1:40 {titer} — ABNORMAL HIGH

## 2023-02-23 LAB — ANA: Anti Nuclear Antibody (ANA): POSITIVE — AB

## 2023-02-24 ENCOUNTER — Other Ambulatory Visit: Payer: Self-pay | Admitting: Physician Assistant

## 2023-02-25 NOTE — Progress Notes (Signed)
 Her ANA is mildly positive.  This may indicate an underlying autoimmune issue.  She can discuss further with rheumatology.  Her lead level is normal.

## 2023-02-25 NOTE — Telephone Encounter (Signed)
 See previews note

## 2023-02-26 ENCOUNTER — Encounter: Payer: Self-pay | Admitting: Family Medicine

## 2023-03-06 ENCOUNTER — Encounter: Payer: Self-pay | Admitting: Family Medicine

## 2023-03-06 DIAGNOSIS — F411 Generalized anxiety disorder: Secondary | ICD-10-CM | POA: Diagnosis not present

## 2023-03-06 DIAGNOSIS — F3131 Bipolar disorder, current episode depressed, mild: Secondary | ICD-10-CM | POA: Diagnosis not present

## 2023-03-06 DIAGNOSIS — F410 Panic disorder [episodic paroxysmal anxiety] without agoraphobia: Secondary | ICD-10-CM | POA: Diagnosis not present

## 2023-03-06 DIAGNOSIS — F5102 Adjustment insomnia: Secondary | ICD-10-CM | POA: Diagnosis not present

## 2023-03-06 DIAGNOSIS — F4310 Post-traumatic stress disorder, unspecified: Secondary | ICD-10-CM | POA: Diagnosis not present

## 2023-03-06 NOTE — Telephone Encounter (Signed)
Please stated a PA for glucose monitor  Thanks

## 2023-03-07 ENCOUNTER — Telehealth: Payer: Self-pay

## 2023-03-07 NOTE — Telephone Encounter (Signed)
*  Primary  Pharmacy Patient Advocate Encounter   Received notification from Fax that prior authorization for FreeStyle Libre 3 Sensor  is required/requested.   Insurance verification completed.   The patient is insured through [facility] .   Per test claim: PA required; PA submitted to above mentioned insurance via CoverMyMeds Key/confirmation #/EOC [id number] Status is pending

## 2023-03-08 ENCOUNTER — Other Ambulatory Visit (HOSPITAL_COMMUNITY): Payer: Self-pay

## 2023-03-08 NOTE — Telephone Encounter (Signed)
Noted

## 2023-03-08 NOTE — Telephone Encounter (Signed)
Pharmacy Patient Advocate Encounter  Received notification from [facility] that Prior Authorization for FreeStyle Libre 3 Sensor has been DENIED.  Full denial letter will be uploaded to the media tab. See denial reason below.   PA #/Case ID/Reference #:  On 03/07/2023, we asked your provider for the following important facts or documents: Per your health plan's criteria, this product is covered if you meet the following: One of the following: (A) All of the following: (I) You have high blood sugars (insulin-dependent diabetes). (II) You will use continuous glucose monitor system as prescribed. (III) You had a face-to-face visit with your doctor to evaluate your blood sugar control (and determine that all of the above criteria has been met within six months of the request). (B) Both of the following: (I) You have high blood sugars (insulin-dependent diabetes). (II) You are using an external insulin pump.

## 2023-03-11 DIAGNOSIS — Z1231 Encounter for screening mammogram for malignant neoplasm of breast: Secondary | ICD-10-CM

## 2023-03-12 ENCOUNTER — Telehealth: Payer: Self-pay | Admitting: *Deleted

## 2023-03-12 ENCOUNTER — Other Ambulatory Visit: Payer: Self-pay | Admitting: *Deleted

## 2023-03-12 MED ORDER — FREESTYLE LIBRE 3 SENSOR MISC: 11 refills | Status: DC

## 2023-03-12 NOTE — Telephone Encounter (Signed)
Paitent requesting to incresed Rx Mounjaro  Please advise

## 2023-03-13 ENCOUNTER — Other Ambulatory Visit (HOSPITAL_COMMUNITY): Payer: Self-pay

## 2023-03-13 ENCOUNTER — Other Ambulatory Visit: Payer: Self-pay | Admitting: *Deleted

## 2023-03-13 MED ORDER — TIRZEPATIDE 5 MG/0.5ML ~~LOC~~ SOAJ: 5.0000 mg | SUBCUTANEOUS | 1 refills | Status: DC

## 2023-03-13 NOTE — Telephone Encounter (Signed)
Rx amlodipine last refill by  Jeani Hawking, PA-C   On 12/30/2022 # 30 with 1 refill  New prescription for glucometer send on 03/12/2023  Please advise if is ok to send new Rx [facility]

## 2023-03-13 NOTE — Telephone Encounter (Signed)
Ok to send in new prescription for 5 mg weekly. Please make sure she follows up with [place] soon.  [name]. [name], MD 03/13/2023 10:40 AM

## 2023-03-13 NOTE — Telephone Encounter (Signed)
Rx Mounjaro send to pharmacy

## 2023-03-14 ENCOUNTER — Telehealth: Payer: Self-pay | Admitting: Family Medicine

## 2023-03-14 NOTE — Telephone Encounter (Unsigned)
Copied from CRM [id number]. Topic: Referral - Question >> Mar 14, 2023  2:06 PM Gurney Maxin H wrote: Reason for CRM: Patient states she was given a referral to Rheumatology and when she arrived at appointment she was told office doesn't take her Medicaid insurance. Patient would like to know if office can recommend anther facility that she can go to that takes her insurance.  Simar [phone]

## 2023-03-15 MED ORDER — AMLODIPINE BESYLATE 5 MG PO TABS: 5.0000 mg | ORAL_TABLET | Freq: Every day | ORAL | 1 refills | Status: DC

## 2023-03-18 ENCOUNTER — Telehealth: Payer: Self-pay | Admitting: *Deleted

## 2023-03-18 ENCOUNTER — Other Ambulatory Visit: Payer: Self-pay | Admitting: Family Medicine

## 2023-03-18 NOTE — Telephone Encounter (Signed)
Copied from CRM [id number]. Topic: Clinical - Prescription Issue >> Mar 15, 2023 11:55 AM Darrough Mandril wrote: Reason for CRM: Patient called states she sent a message about medication and has not received response. Let her know the refill for amLODipine (NORVASC) 5 MG tablet was sent today. Patient states Continuous Glucose Sensor (FREESTYLE LIBRE 3 SENSOR) MISC requires prior authorization and that last time it was because the incorrect one was ordered so she wanted to make sure the correct one was ordered and if so could prior authorization be submitted. Patient also needed referral for rheumatologist that accepts [employer]. Thank You    (Key: [id number]) - [id number] FreeStyle Libre 3 Reader device status: PA RequestCreated: January 8th, 2025 [phone]: February 3rd, 2025 Waiting for determination

## 2023-03-19 ENCOUNTER — Telehealth: Payer: Self-pay | Admitting: Family Medicine

## 2023-03-19 ENCOUNTER — Other Ambulatory Visit: Payer: Self-pay | Admitting: *Deleted

## 2023-03-19 ENCOUNTER — Telehealth: Payer: Self-pay | Admitting: *Deleted

## 2023-03-19 DIAGNOSIS — M2550 Pain in unspecified joint: Secondary | ICD-10-CM

## 2023-03-19 DIAGNOSIS — G8929 Other chronic pain: Secondary | ICD-10-CM

## 2023-03-19 NOTE — Telephone Encounter (Signed)
Ok to write letter.  Heidi Holland. [name], MD 03/19/2023 11:50 AM

## 2023-03-19 NOTE — Telephone Encounter (Signed)
Patient requesting note for work stating she is unable to work standing up, need to be sitting when working  Ok to send letter?  Requesting new rheumatology referral that accept her insurance   Referral send

## 2023-03-19 NOTE — Telephone Encounter (Signed)
 Noted

## 2023-03-19 NOTE — Telephone Encounter (Signed)
Denied on February 3 by [facility] 2017 NCPDP Request Reference Number: [id number]. FREESTY LIBR MIS 3 READER is denied for not meeting the prior authorization requirement(s)  Patient notified

## 2023-03-19 NOTE — Telephone Encounter (Unsigned)
Copied from CRM [id number]. Topic: Referral - Status >> Mar 19, 2023  2:31 PM Heidi Holland wrote: Reason for CRM: [facility] says that patient canceled 2 new patient appointments and can no longer be seen there

## 2023-03-20 ENCOUNTER — Encounter: Payer: Self-pay | Admitting: Family Medicine

## 2023-03-20 NOTE — Telephone Encounter (Signed)
 Letter sent via MyChart for patient. Pt has been advised letter in MyChart

## 2023-03-23 DIAGNOSIS — I10 Essential (primary) hypertension: Secondary | ICD-10-CM | POA: Diagnosis not present

## 2023-03-25 NOTE — Telephone Encounter (Signed)
 Copied from CRM [id number]. Topic: General - Other >> Mar 25, 2023 11:13 AM Kita Perish H wrote: Reason for CRM: Donavon Fudge from [employer] following up to verify if office received order for diabetic supplies for patient.  Donavon Fudge [phone] fax   No form received  Ysabel Cowgill,RMA

## 2023-03-30 ENCOUNTER — Ambulatory Visit: Payer: Medicaid Other

## 2023-04-01 ENCOUNTER — Telehealth: Payer: Medicaid Other | Admitting: Physician Assistant

## 2023-04-01 ENCOUNTER — Telehealth: Payer: Self-pay | Admitting: *Deleted

## 2023-04-01 DIAGNOSIS — N760 Acute vaginitis: Secondary | ICD-10-CM | POA: Diagnosis not present

## 2023-04-01 MED ORDER — FLUCONAZOLE 150 MG PO TABS: ORAL_TABLET | ORAL | 0 refills | Status: DC

## 2023-04-01 NOTE — Progress Notes (Signed)
 Virtual Visit Consent   Heidi Holland, you are scheduled for a virtual visit with a [facility] provider today. Just as with appointments in the office, your consent must be obtained to participate. Your consent will be active for this visit and any virtual visit you may have with one of our providers in the next 365 days. If you have a MyChart account, a copy of this consent can be sent to you electronically.  As this is a virtual visit, video technology does not allow for your provider to perform a traditional examination. This may limit your provider's ability to fully assess your condition. If your provider identifies any concerns that need to be evaluated in person or the need to arrange testing (such as labs, EKG, etc.), we will make arrangements to do so. Although advances in technology are sophisticated, we cannot ensure that it will always work on either your end or our end. If the connection with a video visit is poor, the visit may have to be switched to a telephone visit. With either a video or telephone visit, we are not always able to ensure that we have a secure connection.  By engaging in this virtual visit, you consent to the provision of healthcare and authorize for your insurance to be billed (if applicable) for the services provided during this visit. Depending on your insurance coverage, you may receive a charge related to this service.  I need to obtain your verbal consent now. Are you willing to proceed with your visit today? Heidi Holland has provided verbal consent on 04/01/2023 for a virtual visit (video or telephone). Laure Kidney, [place]  Date: 04/01/2023 1:51 PM   Virtual Visit via Video Note   I, Laure Kidney, connected with  Heidi Holland  ([id number], 08/20/1975) on 04/01/23 at  1:45 PM EST by a video-enabled telemedicine application and verified that I am speaking with the correct person using two identifiers.  Location: Patient: Virtual Visit Location Patient:  Home Provider: Virtual Visit Location Provider: Home Office   I discussed the limitations of evaluation and management by telemedicine and the availability of in person appointments. The patient expressed understanding and agreed to proceed.    History of Present Illness: Heidi Holland is a 48 y.o. who identifies as a female who was assigned female at birth, and is being seen today for vaginal itching.  HPI: Vaginal Itching The patient's primary symptoms include genital itching. This is a new problem. The current episode started yesterday. The problem occurs constantly. The problem affects both sides. She is not pregnant. Pertinent negatives include no abdominal pain, anorexia, back pain, chills, constipation, diarrhea, discolored urine, dysuria, fever, flank pain, frequency, headaches, hematuria, joint pain, joint swelling, nausea, painful intercourse, rash, sore throat, urgency or vomiting. Nothing aggravates the symptoms. She has tried nothing for the symptoms. The treatment provided mild relief. She is not sexually active. No, her partner does not have an STD. She uses nothing for contraception.    Problems:  Patient Active Problem List   Diagnosis Date Noted   Ovarian cyst 02/20/2023   Essential hypertension 02/20/2023   Bipolar 1 disorder, depressed (HCC) 02/16/2022   Paresthesia 09/13/2021   CIN I (cervical intraepithelial neoplasia I) 03/03/2021   LSIL pap smear of cervix/human papillomavirus (HPV) positive 10/24/2020   Family history of early CAD 10/24/2020   B12 deficiency 07/26/2020   Somatic dysfunction of spine, cervical 07/21/2020   Status post left hip replacement 03/12/2019   T2DM (type 2 diabetes mellitus) (HCC)  07/31/2018   Dyslipidemia associated with type 2 diabetes mellitus (HCC) 07/31/2018   Allergic rhinitis 07/31/2018   Mild intermittent asthma, uncomplicated 07/31/2018   GERD (gastroesophageal reflux disease) 07/31/2018   S/P gastric bypass 07/31/2018   Migraine  07/31/2018   Anxiety 07/31/2018   Depression, major, single episode, complete remission (HCC) 07/31/2018   Insomnia 07/31/2018   Umbilical hernia 07/31/2018   Iron deficiency anemia 06/09/2018   Cervicogenic headache 05/12/2018   Low back pain 05/12/2018   Polyarthralgia 05/12/2018    Allergies:  Allergies  Allergen Reactions   Ciprofloxacin Itching   Medications:  Current Outpatient Medications:    albuterol (VENTOLIN HFA) 108 (90 Base) MCG/ACT inhaler, Inhale 1-2 puffs into the lungs every 6 (six) hours as needed for wheezing or shortness of breath., Disp: 8.5 each, Rfl: 2   amLODipine (NORVASC) 5 MG tablet, Take 1 tablet (5 mg total) by mouth daily., Disp: 30 tablet, Rfl: 1   AUVELITY 45-105 MG TBCR, Take by mouth., Disp: , Rfl:    azelastine (ASTELIN) 0.1 % nasal spray, Place 2 sprays into both nostrils 2 (two) times daily., Disp: 30 mL, Rfl: 12   Blood Pressure Monitoring (BLOOD PRESSURE CUFF) MISC, Use daily as needed to check blood sugar, Disp: 1 each, Rfl: 0   clonazePAM (KLONOPIN) 0.5 MG tablet, Take 1 tablet (0.5 mg total) by mouth 2 (two) times daily as needed. for anxiety, Disp: 60 tablet, Rfl: 0   Continuous Glucose Sensor (FREESTYLE LIBRE 3 SENSOR) MISC, Place 1 sensor on the skin every 14 days. Use to check glucose continuously DX E11.9, Disp: 2 each, Rfl: 11   HYDROcodone-acetaminophen (NORCO) 10-325 MG tablet, Take 1 tablet by mouth every 6 (six) hours as needed., Disp: , Rfl:    lumateperone tosylate (CAPLYTA) 42 MG capsule, , Disp: , Rfl:    MAGNESIUM PO, Take by mouth., Disp: , Rfl:    metFORMIN (GLUCOPHAGE) 1000 MG tablet, TAKE 1 TABLET BY MOUTH TWICE A DAY WITH FOOD, Disp: 60 tablet, Rfl: 5   nystatin (MYCOSTATIN) 100000 UNIT/ML suspension, Take 5 mLs (500,000 Units total) by mouth 4 (four) times daily., Disp: 60 mL, Rfl: 0   ondansetron (ZOFRAN-ODT) 4 MG disintegrating tablet, TAKE 1 TABLET (4 MG TOTAL) BY MOUTH EVERY 6 (SIX) HOURS AS NEEDED FOR NAUSEA OR VOMITING,  Disp: 18 tablet, Rfl: 11   pantoprazole (PROTONIX) 40 MG tablet, Take 1 tablet (40 mg total) by mouth daily. TAKE 1 TABLET BY MOUTH 30-60 minutes before breakfast, Disp: 30 tablet, Rfl: 11   predniSONE (DELTASONE) 50 MG tablet, Take 1 tablet daily for 5 days., Disp: 5 tablet, Rfl: 0   predniSONE (DELTASONE) 50 MG tablet, Take 1 tablet (50 mg total) by mouth daily., Disp: 5 tablet, Rfl: 0   pregabalin (LYRICA) 150 MG capsule, TAKE 1 CAPSULE BY MOUTH TWICE A DAY, Disp: 60 capsule, Rfl: 5   QUEtiapine (SEROQUEL) 100 MG tablet, Take by mouth., Disp: , Rfl:    Rimegepant Sulfate (NURTEC) 75 MG TBDP, Take as directed, Disp: , Rfl:    rosuvastatin (CRESTOR) 20 MG tablet, Take 1 tablet (20 mg total) by mouth daily., Disp: 90 tablet, Rfl: 3   SUMAtriptan (IMITREX) 25 MG tablet, TAKE 1 TABLET DAILY AS NEEDED FOR MIGRAINE, MAY REPEAT IN 2 HOURS IF HEADACHE PERSISTS OR RECURS., Disp: 9 tablet, Rfl: 2   tirzepatide (MOUNJARO) 5 MG/0.5ML Pen, Inject 5 mg into the skin once a week., Disp: 6 mL, Rfl: 1   tiZANidine (ZANAFLEX) 4 MG tablet,  TAKE 1 TABLET BY MOUTH THREE TIMES A DAY, Disp: 90 tablet, Rfl: 5   Ubrogepant (UBRELVY) 100 MG TABS, Take as directed, Disp: , Rfl:    zolpidem (AMBIEN) 5 MG tablet, Take 1 tablet (5 mg total) by mouth at bedtime as needed for sleep., Disp: 30 tablet, Rfl: 5  Observations/Objective: Patient is well-developed, well-nourished in no acute distress.  Resting comfortably  at home.  Head is normocephalic, atraumatic.  No labored breathing.  Speech is clear and coherent with logical content.  Patient is alert and oriented at baseline.    Assessment and Plan: 1. Vaginosis (Primary)  Patient presenting with vaginal itching most consistent with yeast vaginosis.  I also considered PID, pregnancy, ectopic pregnancy, endometriosis, tubovarian abscess, appendicitis, UTI and pyelonephritis, but this appears less likely considering the data gathered thus far.  I have instructed the  patient to present to the ER at any time if there are any new or worsening symptoms.  The patient expressed understanding of and agreement with this plan.  Opportunity was given for questions prior to discharge and all stated questions were answered to the patient's satisfaction.   Follow Up Instructions: I discussed the assessment and treatment plan with the patient. The patient was provided an opportunity to ask questions and all were answered. The patient agreed with the plan and demonstrated an understanding of the instructions.  A copy of instructions were sent to the patient via MyChart unless otherwise noted below.   Patient has requested to receive PHI (AVS, Work Notes, etc) pertaining to this video visit through e-mail as they are currently without active MyChart. They have voiced understand that email is not considered secure and their health information could be viewed by someone other than the patient.   The patient was advised to call back or seek an in-person evaluation if the symptoms worsen or if the condition fails to improve as anticipated.    Laure Kidney, PA-C

## 2023-04-01 NOTE — Telephone Encounter (Signed)
 Copied from CRM [id number]. Topic: General - Other >> Mar 29, 2023 11:17 AM Sonny Dandy B wrote: Reason for CRM: Selena Batten from [facility] called to follow up on a prescription that was faxed over yesterday for Diabetic Supplies for the pt . Please call her back at [phone] please faxed the completed form to fax# [phone]   Form Faxed to [phone]  [facility]

## 2023-04-01 NOTE — Patient Instructions (Signed)
 Lenon Ahmadi, thank you for joining Laure Kidney, PA-C for today's virtual visit.  While this provider is not your primary care provider (PCP), if your PCP is located in our provider database this encounter information will be shared with them immediately following your visit.   A [facility] MyChart account gives you access to today's visit and all your visits, tests, and labs performed at [facility] " click here if you don't have a [facility] MyChart account or go to [email or web address]  Consent: (Patient) Heidi Holland provided verbal consent for this virtual visit at the beginning of the encounter.  Current Medications:  Current Outpatient Medications:    albuterol (VENTOLIN HFA) 108 (90 Base) MCG/ACT inhaler, Inhale 1-2 puffs into the lungs every 6 (six) hours as needed for wheezing or shortness of breath., Disp: 8.5 each, Rfl: 2   amLODipine (NORVASC) 5 MG tablet, Take 1 tablet (5 mg total) by mouth daily., Disp: 30 tablet, Rfl: 1   AUVELITY 45-105 MG TBCR, Take by mouth., Disp: , Rfl:    azelastine (ASTELIN) 0.1 % nasal spray, Place 2 sprays into both nostrils 2 (two) times daily., Disp: 30 mL, Rfl: 12   Blood Pressure Monitoring (BLOOD PRESSURE CUFF) MISC, Use daily as needed to check blood sugar, Disp: 1 each, Rfl: 0   clonazePAM (KLONOPIN) 0.5 MG tablet, Take 1 tablet (0.5 mg total) by mouth 2 (two) times daily as needed. for anxiety, Disp: 60 tablet, Rfl: 0   Continuous Glucose Sensor (FREESTYLE LIBRE 3 SENSOR) MISC, Place 1 sensor on the skin every 14 days. Use to check glucose continuously DX E11.9, Disp: 2 each, Rfl: 11   HYDROcodone-acetaminophen (NORCO) 10-325 MG tablet, Take 1 tablet by mouth every 6 (six) hours as needed., Disp: , Rfl:    lumateperone tosylate (CAPLYTA) 42 MG capsule, , Disp: , Rfl:    MAGNESIUM PO, Take by mouth., Disp: , Rfl:    metFORMIN (GLUCOPHAGE) 1000 MG tablet, TAKE 1 TABLET BY MOUTH TWICE A DAY WITH FOOD, Disp: 60 tablet, Rfl: 5    nystatin (MYCOSTATIN) 100000 UNIT/ML suspension, Take 5 mLs (500,000 Units total) by mouth 4 (four) times daily., Disp: 60 mL, Rfl: 0   ondansetron (ZOFRAN-ODT) 4 MG disintegrating tablet, TAKE 1 TABLET (4 MG TOTAL) BY MOUTH EVERY 6 (SIX) HOURS AS NEEDED FOR NAUSEA OR VOMITING, Disp: 18 tablet, Rfl: 11   pantoprazole (PROTONIX) 40 MG tablet, Take 1 tablet (40 mg total) by mouth daily. TAKE 1 TABLET BY MOUTH 30-60 minutes before breakfast, Disp: 30 tablet, Rfl: 11   predniSONE (DELTASONE) 50 MG tablet, Take 1 tablet daily for 5 days., Disp: 5 tablet, Rfl: 0   predniSONE (DELTASONE) 50 MG tablet, Take 1 tablet (50 mg total) by mouth daily., Disp: 5 tablet, Rfl: 0   pregabalin (LYRICA) 150 MG capsule, TAKE 1 CAPSULE BY MOUTH TWICE A DAY, Disp: 60 capsule, Rfl: 5   QUEtiapine (SEROQUEL) 100 MG tablet, Take by mouth., Disp: , Rfl:    Rimegepant Sulfate (NURTEC) 75 MG TBDP, Take as directed, Disp: , Rfl:    rosuvastatin (CRESTOR) 20 MG tablet, Take 1 tablet (20 mg total) by mouth daily., Disp: 90 tablet, Rfl: 3   SUMAtriptan (IMITREX) 25 MG tablet, TAKE 1 TABLET DAILY AS NEEDED FOR MIGRAINE, MAY REPEAT IN 2 HOURS IF HEADACHE PERSISTS OR RECURS., Disp: 9 tablet, Rfl: 2   tirzepatide (MOUNJARO) 5 MG/0.5ML Pen, Inject 5 mg into the skin once a week., Disp: 6 mL, Rfl: 1   tiZANidine (  ZANAFLEX) 4 MG tablet, TAKE 1 TABLET BY MOUTH THREE TIMES A DAY, Disp: 90 tablet, Rfl: 5   Ubrogepant (UBRELVY) 100 MG TABS, Take as directed, Disp: , Rfl:    zolpidem (AMBIEN) 5 MG tablet, Take 1 tablet (5 mg total) by mouth at bedtime as needed for sleep., Disp: 30 tablet, Rfl: 5   Medications ordered in this encounter:  No orders of the defined types were placed in this encounter.    *If you need refills on other medications prior to your next appointment, please contact your pharmacy*  Follow-Up: Call back or seek an in-person evaluation if the symptoms worsen or if the condition fails to improve as anticipated.  [facility] [phone]  Other Instructions Report to ER with worsening symptoms.   If you have been instructed to have an in-person evaluation today at a local Urgent Care facility, please use the link below. It will take you to a list of all of our available [facility], including address, phone number and hours of operation. Please do not delay care.  [facility]  If you or a family member do not have a primary care provider, use the link below to schedule a visit and establish care. When you choose a [place] primary care physician or advanced practice provider, you gain a long-term partner in health. Find a Primary Care Provider  Learn more about [facility] in-office and virtual care options: [place] - Get Care Now

## 2023-04-03 ENCOUNTER — Encounter: Payer: Self-pay | Admitting: Family Medicine

## 2023-04-03 DIAGNOSIS — F4310 Post-traumatic stress disorder, unspecified: Secondary | ICD-10-CM | POA: Diagnosis not present

## 2023-04-03 DIAGNOSIS — F411 Generalized anxiety disorder: Secondary | ICD-10-CM | POA: Diagnosis not present

## 2023-04-03 DIAGNOSIS — F3131 Bipolar disorder, current episode depressed, mild: Secondary | ICD-10-CM | POA: Diagnosis not present

## 2023-04-03 DIAGNOSIS — F5102 Adjustment insomnia: Secondary | ICD-10-CM | POA: Diagnosis not present

## 2023-04-03 DIAGNOSIS — F410 Panic disorder [episodic paroxysmal anxiety] without agoraphobia: Secondary | ICD-10-CM | POA: Diagnosis not present

## 2023-04-05 DIAGNOSIS — E119 Type 2 diabetes mellitus without complications: Secondary | ICD-10-CM | POA: Diagnosis not present

## 2023-04-07 ENCOUNTER — Other Ambulatory Visit: Payer: Self-pay | Admitting: Family Medicine

## 2023-04-09 ENCOUNTER — Encounter (HOSPITAL_COMMUNITY): Payer: Self-pay

## 2023-04-09 ENCOUNTER — Ambulatory Visit (HOSPITAL_COMMUNITY)
Admission: RE | Admit: 2023-04-09 | Discharge: 2023-04-09 | Disposition: A | Discharge status: Home / Self Care | Payer: Medicaid Other | Source: Ambulatory Visit | Attending: Physician Assistant | Admitting: Physician Assistant

## 2023-04-09 VITALS — BP 143/85 | HR 78 | Temp 99.0°F | Resp 16 | Ht 62.0 in | Wt 189.0 lb

## 2023-04-09 DIAGNOSIS — Z711 Person with feared health complaint in whom no diagnosis is made: Secondary | ICD-10-CM | POA: Diagnosis not present

## 2023-04-09 DIAGNOSIS — N898 Other specified noninflammatory disorders of vagina: Secondary | ICD-10-CM | POA: Diagnosis not present

## 2023-04-09 HISTORY — DX: Unspecified ovarian cyst, unspecified side: N83.209

## 2023-04-09 LAB — POCT URINALYSIS DIP (MANUAL ENTRY)
Bilirubin, UA: NEGATIVE
Glucose, UA: NEGATIVE mg/dL
Ketones, POC UA: NEGATIVE mg/dL
Nitrite, UA: NEGATIVE
Protein Ur, POC: NEGATIVE mg/dL
Spec Grav, UA: 1.01 (ref 1.010–1.025)
Urobilinogen, UA: 0.2 U/dL
pH, UA: 5.5 (ref 5.0–8.0)

## 2023-04-09 LAB — POCT FASTING CBG KUC MANUAL ENTRY: POCT Glucose (KUC): 96 mg/dL (ref 70–99)

## 2023-04-09 NOTE — Discharge Instructions (Signed)
 At this time I suspect that you likely have bacterial vaginosis.  We are still waiting on the results of your cervicovaginal swab and we will keep you updated on the results once they are available.  If medications are indicated by those results they will be sent in for you to the pharmacy that we have on file.  I am concerned that your urine dip results may have been contaminated given the amount of vaginal discharge and irritation that you expressed concern for.  I have sent in a urine sample for culture for definitive rule out of UTI.  If antibiotic is indicated at that time it will be sent in for you as well. At this time I recommend refraining from sexual activity until your results are back and they show negative results or you have completed an appropriate medication regimen.  Please follow-up as needed if your symptoms are progressing or persisting
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
## 2023-04-10 ENCOUNTER — Encounter: Payer: Self-pay | Admitting: Family Medicine

## 2023-04-10 ENCOUNTER — Telehealth: Payer: Self-pay

## 2023-04-10 LAB — CERVICOVAGINAL ANCILLARY ONLY
Bacterial Vaginitis (gardnerella): NEGATIVE
Candida Glabrata: NEGATIVE
Candida Vaginitis: NEGATIVE
Chlamydia: NEGATIVE
Comment: NEGATIVE
Comment: NEGATIVE
Comment: NEGATIVE
Comment: NEGATIVE
Comment: NEGATIVE
Comment: NORMAL
Neisseria Gonorrhea: NEGATIVE
Trichomonas: POSITIVE — AB

## 2023-04-10 MED ORDER — METRONIDAZOLE 500 MG PO TABS: 500.0000 mg | ORAL_TABLET | Freq: Two times a day (BID) | ORAL | 0 refills | Status: AC

## 2023-04-10 MED ORDER — FLUCONAZOLE 150 MG PO TABS: 150.0000 mg | ORAL_TABLET | Freq: Once | ORAL | 0 refills | Status: AC

## 2023-04-10 NOTE — Telephone Encounter (Signed)
 Can you please look into prescription for this patient. I see Dr Everlena Cooper prescribed in 2020 but you also prescribed for patient since then.

## 2023-04-10 NOTE — Telephone Encounter (Signed)
 Per protocol, pt requires tx with metronidazole. Reviewed with patient, verified pharmacy, prescription sent.

## 2023-04-10 NOTE — Telephone Encounter (Signed)
 Pt requested diflucan for abx-associated yeast. Sent per protocol

## 2023-04-10 NOTE — Addendum Note (Signed)
 Addended by: Warren Danes on: 04/10/2023 04:58 PM   Modules accepted: Orders

## 2023-04-11 ENCOUNTER — Other Ambulatory Visit: Payer: Self-pay | Admitting: *Deleted

## 2023-04-11 LAB — URINE CULTURE: Culture: NO GROWTH

## 2023-04-11 MED ORDER — TIZANIDINE HCL 4 MG PO TABS: 4.0000 mg | ORAL_TABLET | Freq: Three times a day (TID) | ORAL | 5 refills | Status: DC

## 2023-04-11 NOTE — Telephone Encounter (Signed)
 Rx send to [facility] pharmacy

## 2023-04-11 NOTE — Telephone Encounter (Signed)
 Ok to send in refill for zanaflex 4 mg three times daily as needed. Dispense #90 with 5 refills.  [name]. [name], MD 04/11/2023 12:15 PM

## 2023-04-16 ENCOUNTER — Telehealth: Payer: Self-pay | Admitting: Physician Assistant

## 2023-04-16 ENCOUNTER — Ambulatory Visit: Payer: Medicaid Other | Admitting: Physician Assistant

## 2023-04-16 NOTE — Telephone Encounter (Signed)
 Good morning Heidi Holland,   Patient called stating that she needed to cancel her appointment with you for this morning at 10:00 due to running a high fever.   Patient states she will call back at a later time to reschedule.

## 2023-04-18 ENCOUNTER — Encounter: Payer: Self-pay | Admitting: Obstetrics and Gynecology

## 2023-04-18 ENCOUNTER — Ambulatory Visit (INDEPENDENT_AMBULATORY_CARE_PROVIDER_SITE_OTHER): Payer: Self-pay | Admitting: Obstetrics and Gynecology

## 2023-04-18 VITALS — BP 114/80 | HR 87 | Ht 62.75 in | Wt 189.0 lb

## 2023-04-18 DIAGNOSIS — N83201 Unspecified ovarian cyst, right side: Secondary | ICD-10-CM | POA: Diagnosis not present

## 2023-04-18 DIAGNOSIS — R5383 Other fatigue: Secondary | ICD-10-CM | POA: Diagnosis not present

## 2023-04-18 DIAGNOSIS — E282 Polycystic ovarian syndrome: Secondary | ICD-10-CM | POA: Diagnosis not present

## 2023-04-18 DIAGNOSIS — E785 Hyperlipidemia, unspecified: Secondary | ICD-10-CM | POA: Diagnosis not present

## 2023-04-18 DIAGNOSIS — E538 Deficiency of other specified B group vitamins: Secondary | ICD-10-CM | POA: Diagnosis not present

## 2023-04-18 DIAGNOSIS — I10 Essential (primary) hypertension: Secondary | ICD-10-CM | POA: Diagnosis not present

## 2023-04-18 DIAGNOSIS — E559 Vitamin D deficiency, unspecified: Secondary | ICD-10-CM | POA: Diagnosis not present

## 2023-04-18 DIAGNOSIS — N951 Menopausal and female climacteric states: Secondary | ICD-10-CM

## 2023-04-18 DIAGNOSIS — E119 Type 2 diabetes mellitus without complications: Secondary | ICD-10-CM | POA: Diagnosis not present

## 2023-04-18 DIAGNOSIS — Z113 Encounter for screening for infections with a predominantly sexual mode of transmission: Secondary | ICD-10-CM | POA: Diagnosis not present

## 2023-04-18 DIAGNOSIS — Z831 Family history of other infectious and parasitic diseases: Secondary | ICD-10-CM

## 2023-04-18 DIAGNOSIS — D509 Iron deficiency anemia, unspecified: Secondary | ICD-10-CM | POA: Diagnosis not present

## 2023-04-18 DIAGNOSIS — Z8619 Personal history of other infectious and parasitic diseases: Secondary | ICD-10-CM | POA: Diagnosis not present

## 2023-04-18 DIAGNOSIS — Z7251 High risk heterosexual behavior: Secondary | ICD-10-CM | POA: Diagnosis not present

## 2023-04-18 MED ORDER — FLUCONAZOLE 150 MG PO TABS
ORAL_TABLET | ORAL | 0 refills | Status: DC
Start: 2023-04-18 — End: 2023-04-24
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
## 2023-04-19 ENCOUNTER — Telehealth: Payer: Self-pay | Admitting: *Deleted

## 2023-04-19 ENCOUNTER — Other Ambulatory Visit (HOSPITAL_COMMUNITY): Payer: Self-pay

## 2023-04-19 ENCOUNTER — Telehealth: Payer: Self-pay

## 2023-04-19 ENCOUNTER — Encounter: Payer: Self-pay | Admitting: Obstetrics and Gynecology

## 2023-04-19 LAB — FOLLICLE STIMULATING HORMONE: FSH: 8.9 m[IU]/mL

## 2023-04-19 LAB — SURESWAB® ADVANCED VAGINITIS PLUS,TMA
C. trachomatis RNA, TMA: NOT DETECTED
CANDIDA SPECIES: NOT DETECTED
Candida glabrata: NOT DETECTED
N. gonorrhoeae RNA, TMA: NOT DETECTED
SURESWAB(R) ADV BACTERIAL VAGINOSIS(BV),TMA: NEGATIVE
TRICHOMONAS VAGINALIS (TV),TMA: NOT DETECTED

## 2023-04-19 LAB — HIV ANTIBODY (ROUTINE TESTING W REFLEX): HIV 1&2 Ab, 4th Generation: NONREACTIVE

## 2023-04-19 LAB — HEPATITIS C ANTIBODY: Hepatitis C Ab: NONREACTIVE

## 2023-04-19 LAB — HEPATITIS B SURFACE ANTIGEN: Hepatitis B Surface Ag: NONREACTIVE

## 2023-04-19 LAB — ESTRADIOL: Estradiol: 36 pg/mL

## 2023-04-19 LAB — RPR: RPR Ser Ql: NONREACTIVE

## 2023-04-19 MED ORDER — TIRZEPATIDE 7.5 MG/0.5ML ~~LOC~~ SOAJ: 7.5000 mg | SUBCUTANEOUS | 0 refills | Status: DC

## 2023-04-19 NOTE — Addendum Note (Signed)
 Addended by: Earley Favor on: 04/19/2023 09:32 AM   Modules accepted: Orders

## 2023-04-19 NOTE — Telephone Encounter (Signed)
 Pharmacy Patient Advocate Encounter   Received notification from Pt Calls Messages that prior authorization for Tizanidine 4mg  tabs is required/requested.   Insurance verification completed.   The patient is insured through [facility] MEDICAID .   Per test claim: PA required; PA submitted to above mentioned insurance via CoverMyMeds Key/confirmation #/EOC [id number] Status is pending

## 2023-04-19 NOTE — Telephone Encounter (Signed)
 PA needed for Tizanidine 4 mg tablet.

## 2023-04-22 NOTE — Telephone Encounter (Signed)
 Pharmacy Patient Advocate Encounter  Received notification from [facility] MEDICAID that Prior Authorization for Tizanidine 4mg  tabs has been APPROVED from 04/19/23 to 04/18/24   PA #/Case ID/Reference #:  [id number]

## 2023-04-23 ENCOUNTER — Encounter: Payer: Self-pay | Admitting: Family Medicine

## 2023-04-23 NOTE — Telephone Encounter (Signed)
 Noted and made pt aware

## 2023-04-24 ENCOUNTER — Encounter: Payer: Self-pay | Admitting: Cardiology

## 2023-04-24 ENCOUNTER — Ambulatory Visit: Payer: Medicaid Other | Attending: Cardiology | Admitting: Cardiology

## 2023-04-24 VITALS — BP 130/82 | HR 79 | Ht 62.0 in | Wt 187.0 lb

## 2023-04-24 DIAGNOSIS — E66811 Obesity, class 1: Secondary | ICD-10-CM

## 2023-04-24 DIAGNOSIS — E119 Type 2 diabetes mellitus without complications: Secondary | ICD-10-CM | POA: Diagnosis not present

## 2023-04-24 DIAGNOSIS — E1169 Type 2 diabetes mellitus with other specified complication: Secondary | ICD-10-CM | POA: Diagnosis not present

## 2023-04-24 DIAGNOSIS — Z9884 Bariatric surgery status: Secondary | ICD-10-CM

## 2023-04-24 DIAGNOSIS — Z6834 Body mass index (BMI) 34.0-34.9, adult: Secondary | ICD-10-CM

## 2023-04-24 DIAGNOSIS — I10 Essential (primary) hypertension: Secondary | ICD-10-CM

## 2023-04-24 DIAGNOSIS — E785 Hyperlipidemia, unspecified: Secondary | ICD-10-CM | POA: Diagnosis not present

## 2023-04-24 DIAGNOSIS — Z8249 Family history of ischemic heart disease and other diseases of the circulatory system: Secondary | ICD-10-CM | POA: Diagnosis not present

## 2023-04-24 DIAGNOSIS — E6609 Other obesity due to excess calories: Secondary | ICD-10-CM | POA: Diagnosis not present

## 2023-04-24 MED ORDER — ROSUVASTATIN CALCIUM 5 MG PO TABS: 5.0000 mg | ORAL_TABLET | Freq: Every day | ORAL | 3 refills | Status: DC

## 2023-04-24 NOTE — Addendum Note (Signed)
 Addended by: Cherylann Banas on: 04/24/2023 01:24 PM   Modules accepted: Orders

## 2023-04-24 NOTE — Telephone Encounter (Signed)
 PA request has been Approved. New Encounter has been or will be created for follow up. For additional info see Pharmacy Prior Auth telephone encounter from 04/19/2023.

## 2023-04-24 NOTE — Patient Instructions (Addendum)
 Medication Instructions:  Your physician has recommended you make the following change in your medication:   START Rosuvastatin (Crestor) 5 mg once daily at night    *If you need a refill on your cardiac medications before your next appointment, please call your pharmacy*  Lab Work: To be completed in 6 weeks: FASTING lipid panel, direct LDL, CMP (approximately 06/05/23)  If you have labs (blood work) drawn today and your tests are completely normal, you will receive your results only by: MyChart Message (if you have MyChart) OR A paper copy in the mail If you have any lab test that is abnormal or we need to change your treatment, we will call you to review the results.  Testing/Procedures: Your physician has requested that you have a coronary calcium score performed. This is not covered by insurance and will be an out-of-pocket cost of approximately $99.   Your physician has requested that you have an exercise tolerance test. For further information please visit [email or web address] Please also follow instruction sheet, as given.   Follow-Up: At [facility], you and your health needs are our priority.  As part of our continuing mission to provide you with exceptional heart care, we have created designated Provider Care Teams.  These Care Teams include your primary Cardiologist (physician) and Advanced Practice Providers (APPs -  Physician Assistants and Nurse Practitioners) who all work together to provide you with the care you need, when you need it.   Your next appointment:   8 week(s)  The format for your next appointment:   In Person  Provider:   Tessa Lerner, DO { ----------------------------------------------------------------------- Exercise Stress Test    Please arrive 15 minutes prior to your appointment time to allow for registration and insurance purposes.  The test will take approximately 45 minutes to complete.  How to prepare for your Exercise Stress Test: -  Do bring a list of your current medications with you. If you do not take any of the medications listed below, you may take your medications as normal the day of the test. - DO wear comfortable clothes (no dresses or overalls) and walking shoes, tennis shoes preferred (no heels or open toed shoes allowed).   If these instructions are not followed as listed above, your test will be rescheduled at a later date.  If you can not keep you appointment, please provide 24 hours notification to the Stress Lab to avoid a possible $50 charge to your patient account.

## 2023-04-24 NOTE — Progress Notes (Signed)
 Cardiology Office Note:    Date:  04/24/2023  NAME:  Heidi Holland    MRN: [id number] DOB:  12-Jun-1975   PCP:  Ardith Dark, MD  Former Cardiology Providers: NA Primary Cardiologist:  Tessa Lerner, DO, [facility] (established care 04/24/2023) Electrophysiologist:  None   Referring MD: Ardith Dark, MD  Reason of Consult: Cardiac evaluation, family history of heart disease   Chief Complaint  Patient presents with   Follow-up    family history of heart disease.     History of Present Illness:    Heidi Holland is a 48 y.o. Caucasian female whose past medical history and cardiovascular risk factors includes: HTN, Type II DM, hx of PVCs, Hx of gastric bypass, chronic pain, occasional marijuana. She is being seen today for the evaluation of cardiac evaluation, family history of heart disease at the request of Ardith Dark, MD.  She has a significant family history of cardiac disease, with both parents having had cardiac events at a young age. Her father had a quadruple bypass at 34 and later died at 33. Her mother had a heart attack at 22 and later died of lung cancer at 54. Additionally, her grandmother had a stroke.  Sister is 79 years of age overall healthy and well.  Brother passed secondary to heroin overdose.  Hypertension was diagnosed about one to two months ago during a visit to urgent care, where she was experiencing significant pain. Her blood pressure readings have been mostly stable and she is currently on amlodipine.  All the blood pressures are 130/82.    She has a history of diabetes, with her last known HbA1c being 7.4. She is currently on Mounjaro to manage her diabetes and has not been taking metformin recently due to stable blood sugar levels, which she monitors regularly. Her blood sugar levels typically range from the 90s to 100s, and she experiences occasional dips at night.  She underwent a full cardiac workup, including an echocardiogram and stress test, in 2016  prior to her gastric bypass surgery, which showed normal results. She has a history of PVCs diagnosed in 2010, for which she underwent a full workup including a heart monitor.  She has a past medical history of sleep apnea, which she believes may have resolved following her gastric bypass surgery, but she is unsure if it persists.  She does not smoke cigarettes but occasionally uses marijuana.  No chest pain, occasional shortness of breath with overexertion, no lightheadedness, dizziness, syncope, hematuria, melena, or vaginal bleeding. No history of myocardial infarction, stents, thromboembolic events, cerebrovascular accidents, or congestive heart failure.  No structured exercise program or daily routine.  Current Medications: Current Meds  Medication Sig   albuterol (VENTOLIN HFA) 108 (90 Base) MCG/ACT inhaler Inhale 1-2 puffs into the lungs every 6 (six) hours as needed for wheezing or shortness of breath.   amLODipine (NORVASC) 5 MG tablet TAKE 1 TABLET (5 MG TOTAL) BY MOUTH DAILY.   AUVELITY 45-105 MG TBCR Take by mouth.   azelastine (ASTELIN) 0.1 % nasal spray Place 2 sprays into both nostrils 2 (two) times daily.   clonazePAM (KLONOPIN) 0.5 MG tablet Take 1 tablet (0.5 mg total) by mouth 2 (two) times daily as needed. for anxiety   Continuous Glucose Sensor (FREESTYLE LIBRE 3 SENSOR) MISC Place 1 sensor on the skin every 14 days. Use to check glucose continuously DX E11.9   HYDROcodone-acetaminophen (NORCO) 10-325 MG tablet Take 1 tablet by mouth every 6 (six) hours  as needed.   lumateperone tosylate (CAPLYTA) 42 MG capsule    MAGNESIUM PO Take by mouth.   metFORMIN (GLUCOPHAGE) 1000 MG tablet TAKE 1 TABLET BY MOUTH TWICE A DAY WITH FOOD   methocarbamol (ROBAXIN) 750 MG tablet Take 1 tablet by mouth 3 (three) times daily as needed.   ondansetron (ZOFRAN-ODT) 4 MG disintegrating tablet TAKE 1 TABLET (4 MG TOTAL) BY MOUTH EVERY 6 (SIX) HOURS AS NEEDED FOR NAUSEA OR VOMITING    pregabalin (LYRICA) 150 MG capsule TAKE 1 CAPSULE BY MOUTH TWICE A DAY   QUEtiapine (SEROQUEL) 200 MG tablet Take 2 tablets by mouth at bedtime.   rosuvastatin (CRESTOR) 5 MG tablet Take 1 tablet (5 mg total) by mouth daily.   SUMAtriptan (IMITREX) 25 MG tablet Take by mouth.   tirzepatide (MOUNJARO) 7.5 MG/0.5ML Pen Inject 7.5 mg into the skin once a week.   tiZANidine (ZANAFLEX) 4 MG tablet Take 1 tablet (4 mg total) by mouth 3 (three) times daily.   zolpidem (AMBIEN) 5 MG tablet Take 1 tablet (5 mg total) by mouth at bedtime as needed for sleep.     Allergies:    Ciprofloxacin   Past Medical History: Past Medical History:  Diagnosis Date   Abnormal Pap smear of cervix    Allergy    Anxiety    Asthma    Bipolar 1 disorder (HCC)    Chronic pain    Depression    Diabetes mellitus without complication (HCC)    Diverticulitis    GERD (gastroesophageal reflux disease)    HPV in female    Hyperlipidemia    Hypertension    Migraines    Osteoarthritis    Ovarian cyst    right   PCOS (polycystic ovarian syndrome)    STD (sexually transmitted disease)    Trichomonas contact, treated    Vertigo     Past Surgical History: Past Surgical History:  Procedure Laterality Date   BOWEL RESECTION     gasticbypass     GASTRIC BYPASS     KNEE ARTHROSCOPY Bilateral    SHOULDER SURGERY Left    TOTAL HIP ARTHROPLASTY Left     Social History: Social History   Tobacco Use   Smoking status: Never   Smokeless tobacco: Never  Vaping Use   Vaping status: Never Used  Substance Use Topics   Alcohol use: Not Currently   Drug use: Yes    Types: Marijuana    Comment: occ    Family History: Family History  Problem Relation Age of Onset   Stroke Mother    Heart disease Mother    Lung cancer Mother    Rheum arthritis Mother    Arthritis Mother    COPD Mother    Fibromyalgia Mother    Hypertension Father    Diabetes Father    Heart disease Father    Hodgkin's lymphoma Father     Endometriosis Sister    Drug abuse Brother        died of overdose   Breast cancer Paternal Aunt    Diabetes Maternal Grandmother    Lung cancer Maternal Grandmother    Other Maternal Grandmother        pace maker   Skin cancer Paternal Grandmother     ROS:   Review of Systems  Cardiovascular:  Positive for dyspnea on exertion. Negative for chest pain, claudication, irregular heartbeat, leg swelling, near-syncope, orthopnea, palpitations, paroxysmal nocturnal dyspnea and syncope.  Respiratory:  Negative for shortness of  breath.   Hematologic/Lymphatic: Negative for bleeding problem.    EKGs/Labs/Other Studies Reviewed:   EKG: EKG Interpretation Date/Time:  Wednesday April 24 2023 10:26:52 EDT Ventricular Rate:  80 PR Interval:  168 QRS Duration:  72 QT Interval:  370 QTC Calculation: 426 R Axis:   40  Text Interpretation: Normal sinus rhythm Low voltage QRS When compared with ECG of 12-Dec-2021 19:37, No significant change since last tracing Confirmed by Tessa Lerner [phone]) on 04/24/2023 10:55:09 AM  Echocardiogram: NA  Stress Testing:  NA  Labs:    Latest Ref Rng & Units 02/20/2023    9:36 AM 01/16/2023    6:13 PM 07/24/2022   12:19 PM  CBC  WBC 4.0 - 10.5 K/uL 15.7  9.1  8.3   Hemoglobin 12.0 - 15.0 g/dL 60.4  54.0  98.1   Hematocrit 36.0 - 46.0 % 35.6  38.3  35.6   Platelets 150.0 - 400.0 K/uL 425.0  349  319.0        Latest Ref Rng & Units 02/20/2023    9:36 AM 01/16/2023    6:13 PM 07/24/2022   12:19 PM  BMP  Glucose 70 - 99 mg/dL 191  86  478   BUN 6 - 23 mg/dL 11  6  13    Creatinine 0.40 - 1.20 mg/dL 2.95  6.21  3.08   Sodium 135 - 145 mEq/L 136  135  138   Potassium 3.5 - 5.1 mEq/L 3.9  3.5  4.1   Chloride 96 - 112 mEq/L 101  103  106   CO2 19 - 32 mEq/L 26  22  26    Calcium 8.4 - 10.5 mg/dL 9.0  9.5  9.1       Latest Ref Rng & Units 02/20/2023    9:36 AM 01/16/2023    6:13 PM 07/24/2022   12:19 PM  CMP  Glucose 70 - 99 mg/dL 657  86  846   BUN 6  - 23 mg/dL 11  6  13    Creatinine 0.40 - 1.20 mg/dL 9.62  9.52  8.41   Sodium 135 - 145 mEq/L 136  135  138   Potassium 3.5 - 5.1 mEq/L 3.9  3.5  4.1   Chloride 96 - 112 mEq/L 101  103  106   CO2 19 - 32 mEq/L 26  22  26    Calcium 8.4 - 10.5 mg/dL 9.0  9.5  9.1   Total Protein 6.0 - 8.3 g/dL 6.8  7.4  6.7   Total Bilirubin 0.2 - 1.2 mg/dL 0.2  0.5  0.3   Alkaline Phos 39 - 117 U/L 73  62  54   AST 0 - 37 U/L 11  13  11    ALT 0 - 35 U/L 10  15  9      Lab Results  Component Value Date   CHOL 207 (H) 02/20/2023   HDL 52.10 02/20/2023   LDLCALC 121 (H) 02/20/2023   TRIG 171.0 (H) 02/20/2023   CHOLHDL 4 02/20/2023   No results for input(s): "LIPOA" in the last 8760 hours. No components found for: "NTPROBNP" No results for input(s): "PROBNP" in the last 8760 hours. Recent Labs    07/24/22 1219 02/20/23 0936  TSH 0.44 0.93    Physical Exam:    Today's Vitals   04/24/23 1024  BP: 130/82  Pulse: 79  SpO2: 95%  Weight: 187 lb (84.8 kg)  Height: 5\' 2"  (1.575 m)   Body mass index is  34.2 kg/m. Wt Readings from Last 3 Encounters:  04/24/23 187 lb (84.8 kg)  04/18/23 189 lb (85.7 kg)  04/09/23 189 lb (85.7 kg)    Physical Exam  Constitutional: No distress.  hemodynamically stable  Neck: No JVD present.  Cardiovascular: Normal rate, regular rhythm, S1 normal and S2 normal. Exam reveals no gallop, no S3 and no S4.  No murmur heard. Pulses:      Carotid pulses are 2+ on the right side and 2+ on the left side.      Dorsalis pedis pulses are 2+ on the right side and 2+ on the left side.       Posterior tibial pulses are 2+ on the right side and 2+ on the left side.  Pulmonary/Chest: Effort normal and breath sounds normal. No stridor. She has no wheezes. She has no rales.  Musculoskeletal:        General: No edema.     Cervical back: Neck supple.  Skin: Skin is warm.   Impression & Recommendation(s):  Impression:   ICD-10-CM   1. Essential hypertension  I10 EKG 12-Lead     CT CARDIAC SCORING (SELF PAY ONLY)    EXERCISE TOLERANCE TEST (ETT)    2. Type 2 diabetes mellitus without complication, without long-term current use of insulin (HCC)  E11.9 Comprehensive metabolic panel    LDL cholesterol, direct    Lipid panel    CT CARDIAC SCORING (SELF PAY ONLY)    EXERCISE TOLERANCE TEST (ETT)    rosuvastatin (CRESTOR) 5 MG tablet    Lipid panel    LDL cholesterol, direct    Comprehensive metabolic panel    3. Family history of premature CAD  Z82.49 Comprehensive metabolic panel    LDL cholesterol, direct    Lipid panel    CT CARDIAC SCORING (SELF PAY ONLY)    EXERCISE TOLERANCE TEST (ETT)    rosuvastatin (CRESTOR) 5 MG tablet    Lipid panel    LDL cholesterol, direct    Comprehensive metabolic panel    4. Class 1 obesity due to excess calories with serious comorbidity and body mass index (BMI) of 34.0 to 34.9 in adult  E66.811    E66.09    Z68.34     5. Hx of gastric bypass  Z98.84        Recommendation(s):  Essential hypertension Well-controlled with amlodipine. Discussed potential switch to ACE inhibitor or ARB for renal protection due to diabetes and family history of cardiac disease. - Continue amlodipine. - Discuss potential switch to ACE inhibitor or ARB with primary care provider.  Type 2 diabetes mellitus without complication, without long-term current use of insulin (HCC) Most recent hemoglobin A1c 7.4 from [facility] database February 20, 2023 Has a prescription for metformin, but holding off. Currently on Mounjaro. Reemphasized importance of glycemic control. Given the diagnosis of diabetes, family history of premature CAD, recommend statin therapy with a goal LDL of less than 70 mg/dL. Patient states that she would like me to initiate statins for now and long-term follow-up will be with PCP. Start Crestor 5 mg p.o. nightly with follow-up labs in 6 weeks to reevaluate therapy  Type 2 diabetes mellitus with hyperlipidemia: As of January 2025  triglyceride levels are 171 mg/dL and LDL levels are 161 mg/dL. Given the fact that she is a diabetic recommend a goal LDL of at least 70 mg/dL. For reasons unknown she is currently not on statin therapy. We discussed the rationale and the benefits as well as  the risks. I recommended that she follows up with PCP for further discussion and to initiate therapy; however, patient prefers to be started today.  See above  Family history of premature CAD Significant family history of coronary artery disease. Recommended calcium score and treadmill stress test for proactive cardiac risk management. - Order calcium score. - Schedule treadmill stress test.  Class 1 obesity due to excess calories with serious comorbidity and body mass index (BMI) of 34.0 to 34.9 in adult Hx of gastric bypass Body mass index is 34.2 kg/m. I reviewed with her importance of diet, regular physical activity/exercise, weight loss.   Patient is educated on the importance of increasing physical activity gradually as tolerated with a goal of moderate intensity exercise for 30 minutes a day 5 days a week.  I have also encouraged her to have her sleep apnea reevaluated-patient will speak to PCP.  Orders Placed:  Orders Placed This Encounter  Procedures   CT CARDIAC SCORING (SELF PAY ONLY)    Standing Status:   Future    Expiration Date:   04/23/2024    Preferred imaging location?:   [facility]    Is patient pregnant?:   No   Comprehensive metabolic panel    Standing Status:   Future    Number of Occurrences:   1    Expected Date:   06/05/2023    Expiration Date:   04/23/2024   LDL cholesterol, direct    Standing Status:   Future    Number of Occurrences:   1    Expected Date:   06/05/2023    Expiration Date:   04/23/2024   Lipid panel    Standing Status:   Future    Number of Occurrences:   1    Expected Date:   06/05/2023    Expiration Date:   04/23/2024   EXERCISE TOLERANCE TEST (ETT)    Standing Status:   Future     Expiration Date:   04/23/2024    Where should this test be performed:   [facility])    Stress with pharmacologic or treadmill ?:   Treadmill w/ exercise   EKG 12-Lead    Final Medication List:    Meds ordered this encounter  Medications   rosuvastatin (CRESTOR) 5 MG tablet    Sig: Take 1 tablet (5 mg total) by mouth daily.    Dispense:  30 tablet    Refill:  3    Medications Discontinued During This Encounter  Medication Reason   fluconazole (DIFLUCAN) 150 MG tablet Completed Course     Current Outpatient Medications:    albuterol (VENTOLIN HFA) 108 (90 Base) MCG/ACT inhaler, Inhale 1-2 puffs into the lungs every 6 (six) hours as needed for wheezing or shortness of breath., Disp: 8.5 each, Rfl: 2   amLODipine (NORVASC) 5 MG tablet, TAKE 1 TABLET (5 MG TOTAL) BY MOUTH DAILY., Disp: 90 tablet, Rfl: 1   AUVELITY 45-105 MG TBCR, Take by mouth., Disp: , Rfl:    azelastine (ASTELIN) 0.1 % nasal spray, Place 2 sprays into both nostrils 2 (two) times daily., Disp: 30 mL, Rfl: 12   clonazePAM (KLONOPIN) 0.5 MG tablet, Take 1 tablet (0.5 mg total) by mouth 2 (two) times daily as needed. for anxiety, Disp: 60 tablet, Rfl: 0   Continuous Glucose Sensor (FREESTYLE LIBRE 3 SENSOR) MISC, Place 1 sensor on the skin every 14 days. Use to check glucose continuously DX E11.9, Disp: 2 each, Rfl: 11  HYDROcodone-acetaminophen (NORCO) 10-325 MG tablet, Take 1 tablet by mouth every 6 (six) hours as needed., Disp: , Rfl:    lumateperone tosylate (CAPLYTA) 42 MG capsule, , Disp: , Rfl:    MAGNESIUM PO, Take by mouth., Disp: , Rfl:    metFORMIN (GLUCOPHAGE) 1000 MG tablet, TAKE 1 TABLET BY MOUTH TWICE A DAY WITH FOOD, Disp: 60 tablet, Rfl: 5   methocarbamol (ROBAXIN) 750 MG tablet, Take 1 tablet by mouth 3 (three) times daily as needed., Disp: , Rfl:    ondansetron (ZOFRAN-ODT) 4 MG disintegrating tablet, TAKE 1 TABLET (4 MG TOTAL) BY MOUTH EVERY 6 (SIX) HOURS AS NEEDED FOR NAUSEA OR  VOMITING, Disp: 18 tablet, Rfl: 11   pregabalin (LYRICA) 150 MG capsule, TAKE 1 CAPSULE BY MOUTH TWICE A DAY, Disp: 60 capsule, Rfl: 5   QUEtiapine (SEROQUEL) 200 MG tablet, Take 2 tablets by mouth at bedtime., Disp: , Rfl:    rosuvastatin (CRESTOR) 5 MG tablet, Take 1 tablet (5 mg total) by mouth daily., Disp: 30 tablet, Rfl: 3   SUMAtriptan (IMITREX) 25 MG tablet, Take by mouth., Disp: , Rfl:    tirzepatide (MOUNJARO) 7.5 MG/0.5ML Pen, Inject 7.5 mg into the skin once a week., Disp: 2 mL, Rfl: 0   tiZANidine (ZANAFLEX) 4 MG tablet, Take 1 tablet (4 mg total) by mouth 3 (three) times daily., Disp: 90 tablet, Rfl: 5   zolpidem (AMBIEN) 5 MG tablet, Take 1 tablet (5 mg total) by mouth at bedtime as needed for sleep., Disp: 30 tablet, Rfl: 5  Consent:   Informed Consent   Shared Decision Making/Informed Consent The risks [chest pain, shortness of breath, cardiac arrhythmias, dizziness, blood pressure fluctuations, myocardial infarction, stroke/transient ischemic attack, and life-threatening complications (estimated to be 1 in 10,000)], benefits (risk stratification, diagnosing coronary artery disease, treatment guidance) and alternatives of an exercise tolerance test were discussed in detail with Ms. Utecht and she agrees to proceed.      Disposition:   8 weeks sooner if needed..  Patient may be asked to follow-up sooner based on the results of the above-mentioned testing.  Her questions and concerns were addressed to her satisfaction. She voices understanding of the recommendations provided during this encounter.    Signed, Tessa Lerner, DO, [facility]  [facility]  [address] [place], [place] [address] 04/24/2023 12:12 PM

## 2023-04-25 NOTE — Addendum Note (Signed)
 Addended by: Durward Mallard on: 04/25/2023 01:14 PM   Modules accepted: Orders

## 2023-04-26 ENCOUNTER — Encounter: Payer: Self-pay | Admitting: Cardiology

## 2023-04-26 ENCOUNTER — Encounter: Payer: Self-pay | Admitting: Family Medicine

## 2023-04-26 NOTE — Telephone Encounter (Signed)
 See note

## 2023-04-29 ENCOUNTER — Other Ambulatory Visit: Payer: Self-pay | Admitting: *Deleted

## 2023-04-29 MED ORDER — TIRZEPATIDE 5 MG/0.5ML ~~LOC~~ SOAJ: 5.0000 mg | SUBCUTANEOUS | 0 refills | Status: DC

## 2023-04-29 MED ORDER — LOSARTAN POTASSIUM 50 MG PO TABS: 50.0000 mg | ORAL_TABLET | Freq: Every day | ORAL | 1 refills | Status: DC

## 2023-04-29 NOTE — Telephone Encounter (Signed)
 Ok to send in [place] 5 mg daily.  Also ok to send in losartan 50 mg daily. This will take place of her amlodipine 5 mg daily.  Recommend she follow up with [place] in a couple of weeks.  Heidi Holland. [name], MD 04/29/2023 7:58 AM

## 2023-04-30 ENCOUNTER — Telehealth (HOSPITAL_COMMUNITY): Payer: Self-pay

## 2023-04-30 NOTE — Telephone Encounter (Signed)
 Detailed instructions left on the patient's answering machine. [employer]

## 2023-05-01 DIAGNOSIS — F411 Generalized anxiety disorder: Secondary | ICD-10-CM | POA: Diagnosis not present

## 2023-05-01 DIAGNOSIS — F410 Panic disorder [episodic paroxysmal anxiety] without agoraphobia: Secondary | ICD-10-CM | POA: Diagnosis not present

## 2023-05-01 DIAGNOSIS — F5102 Adjustment insomnia: Secondary | ICD-10-CM | POA: Diagnosis not present

## 2023-05-01 DIAGNOSIS — F4310 Post-traumatic stress disorder, unspecified: Secondary | ICD-10-CM | POA: Diagnosis not present

## 2023-05-01 DIAGNOSIS — F3131 Bipolar disorder, current episode depressed, mild: Secondary | ICD-10-CM | POA: Diagnosis not present

## 2023-05-07 ENCOUNTER — Ambulatory Visit (HOSPITAL_COMMUNITY): Attending: Cardiology

## 2023-05-07 DIAGNOSIS — Z8249 Family history of ischemic heart disease and other diseases of the circulatory system: Secondary | ICD-10-CM

## 2023-05-07 DIAGNOSIS — E119 Type 2 diabetes mellitus without complications: Secondary | ICD-10-CM | POA: Diagnosis not present

## 2023-05-07 DIAGNOSIS — I10 Essential (primary) hypertension: Secondary | ICD-10-CM

## 2023-05-07 LAB — EXERCISE TOLERANCE TEST
Base ST Depression (mm): 0 mm
Estimated workload: 7.4
Exercise duration (min): 6 min
Exercise duration (sec): 18 s
MPHR: 173 {beats}/min
Peak HR: 150 {beats}/min
Percent HR: 86 %
Rest HR: 76 {beats}/min
ST Depression (mm): 0 mm

## 2023-05-08 ENCOUNTER — Telehealth: Payer: Self-pay | Admitting: *Deleted

## 2023-05-08 ENCOUNTER — Encounter: Payer: Self-pay | Admitting: Cardiology

## 2023-05-08 NOTE — Telephone Encounter (Signed)
 Copied from CRM [id number]. Topic: Referral - Status >> May 07, 2023  4:35 PM Heidi Holland wrote: Reason for CRM: Patient called in with a fax number where she would like her referral faxed to [phone] [facility].  Patient states she called four times and it has still not been faxed, please advise.   Information send to [facility], referral coordination  [facility]

## 2023-05-12 ENCOUNTER — Other Ambulatory Visit: Payer: Self-pay | Admitting: Family Medicine

## 2023-05-13 DIAGNOSIS — F5102 Adjustment insomnia: Secondary | ICD-10-CM | POA: Diagnosis not present

## 2023-05-13 DIAGNOSIS — F3131 Bipolar disorder, current episode depressed, mild: Secondary | ICD-10-CM | POA: Diagnosis not present

## 2023-05-13 DIAGNOSIS — F4310 Post-traumatic stress disorder, unspecified: Secondary | ICD-10-CM | POA: Diagnosis not present

## 2023-05-13 DIAGNOSIS — F410 Panic disorder [episodic paroxysmal anxiety] without agoraphobia: Secondary | ICD-10-CM | POA: Diagnosis not present

## 2023-05-13 DIAGNOSIS — F411 Generalized anxiety disorder: Secondary | ICD-10-CM | POA: Diagnosis not present

## 2023-05-13 NOTE — Telephone Encounter (Signed)
 Last refills 3 weeks ago (04/18/2023) by Historical Provider, MD

## 2023-05-16 ENCOUNTER — Other Ambulatory Visit: Payer: Self-pay | Admitting: Family Medicine

## 2023-05-20 ENCOUNTER — Ambulatory Visit

## 2023-05-22 ENCOUNTER — Other Ambulatory Visit: Payer: Self-pay | Admitting: *Deleted

## 2023-05-22 MED ORDER — TIRZEPATIDE 7.5 MG/0.5ML ~~LOC~~ SOAJ: 7.5000 mg | SUBCUTANEOUS | 1 refills | Status: DC

## 2023-05-22 NOTE — Telephone Encounter (Signed)
**Note De-identified  [place] Obfuscation** Please advise 

## 2023-05-22 NOTE — Telephone Encounter (Signed)
 Ok to send in Leslie 7.5 mg weekly. Please have her follow up here soon.  [name]. [name], MD 05/22/2023 9:43 AM

## 2023-05-29 DIAGNOSIS — F411 Generalized anxiety disorder: Secondary | ICD-10-CM | POA: Diagnosis not present

## 2023-05-29 DIAGNOSIS — F3131 Bipolar disorder, current episode depressed, mild: Secondary | ICD-10-CM | POA: Diagnosis not present

## 2023-05-29 DIAGNOSIS — F410 Panic disorder [episodic paroxysmal anxiety] without agoraphobia: Secondary | ICD-10-CM | POA: Diagnosis not present

## 2023-05-29 DIAGNOSIS — F4310 Post-traumatic stress disorder, unspecified: Secondary | ICD-10-CM | POA: Diagnosis not present

## 2023-05-30 IMAGING — US US PELVIS COMPLETE WITH TRANSVAGINAL
1 series · 14 of 25 positions shown · non-contrast
Comparison: CT abdomen and pelvis 08/04/2020

CLINICAL DATA: Ovarian cyst, history of polycystic ovarian
syndrome, LMP 01/15/2026, abnormal CT

EXAM:
TRANSABDOMINAL AND TRANSVAGINAL ULTRASOUND OF PELVIS
TECHNIQUE: Both transabdominal and transvaginal ultrasound examinations of the
pelvis were performed. Transabdominal technique was performed for
global imaging of the pelvis including uterus, ovaries, adnexal
regions, and pelvic cul-de-sac. It was necessary to proceed with
endovaginal exam following the transabdominal exam to visualize the
endometrium and ovaries.

[Series 1: us pelvis complete with transvaginal · 14 of 46 slices shown]
[im 1/46]
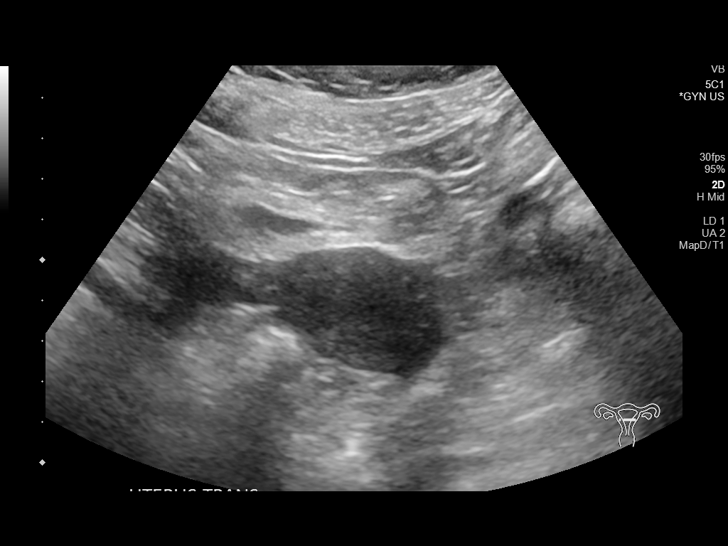
[im 4/46]
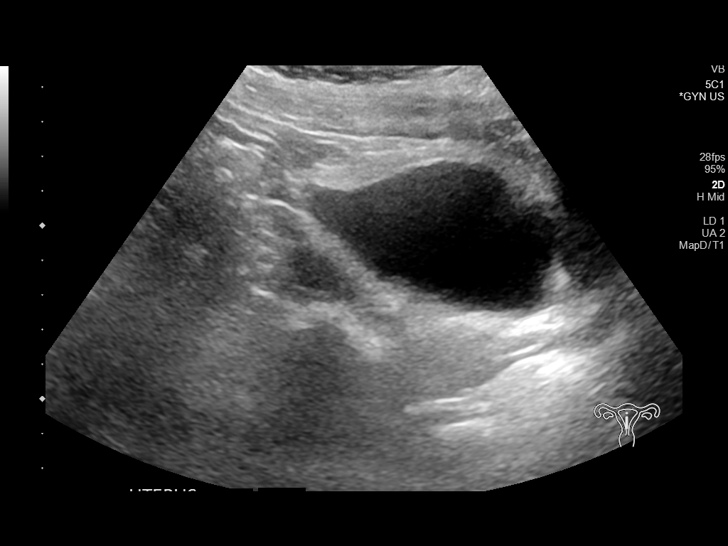
[im 8/46]
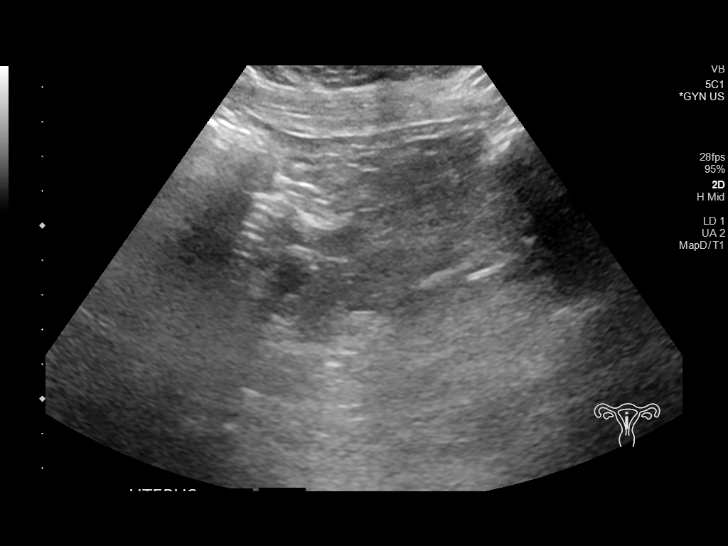
[im 12/46]
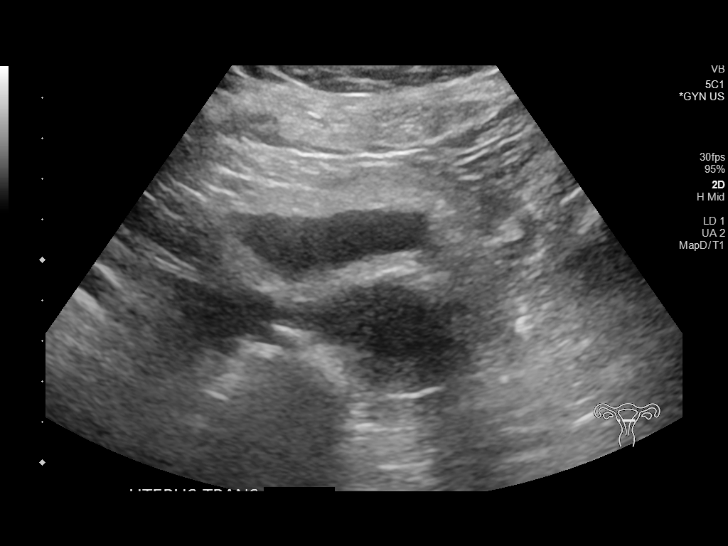
[im 16/46]
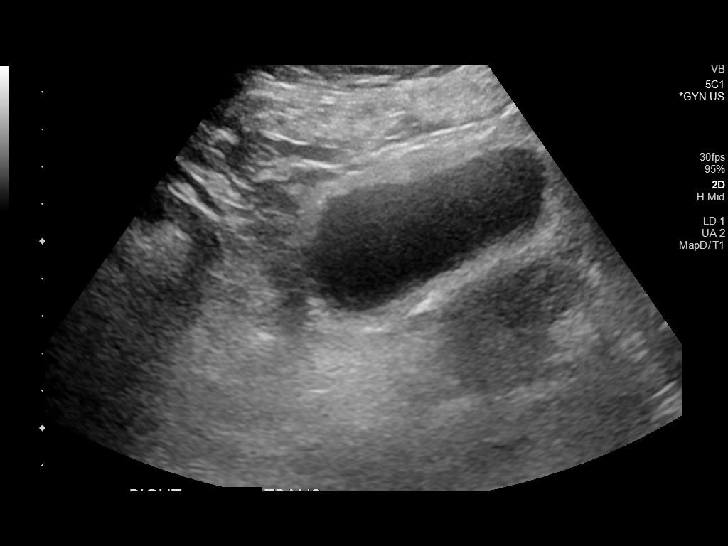
[im 17/46]
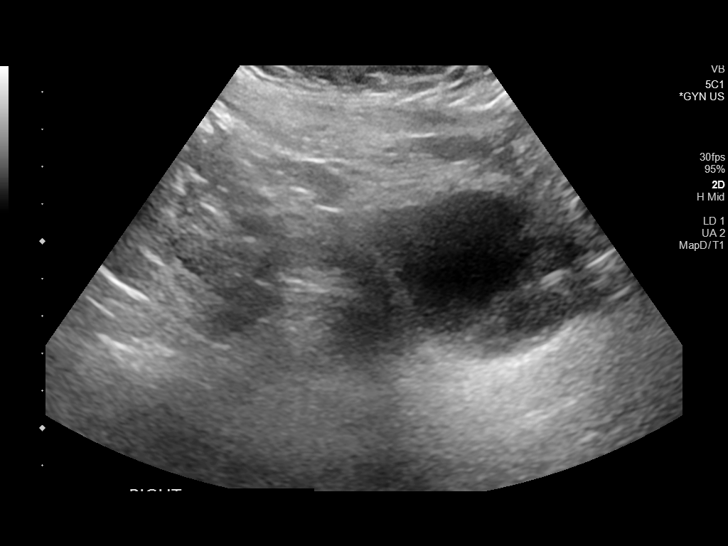
[im 21/46]
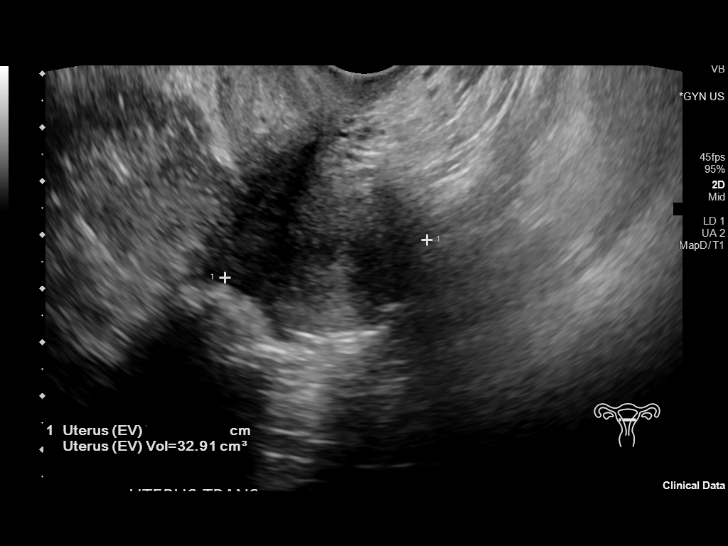
[im 25/46]
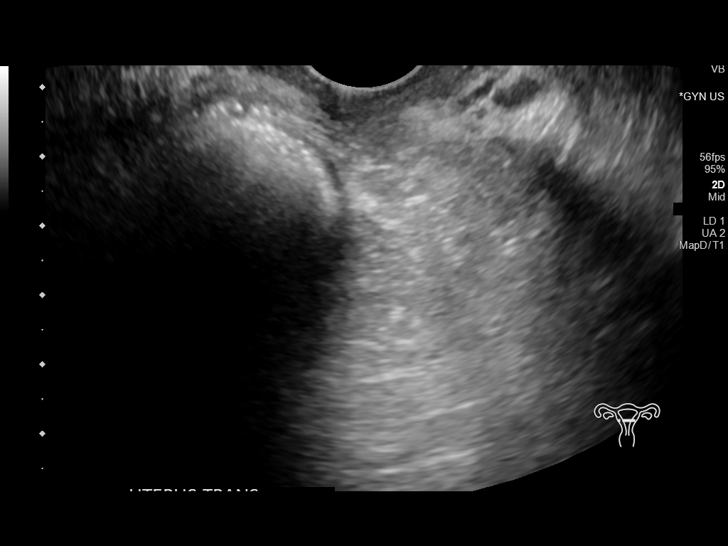
[im 29/46]
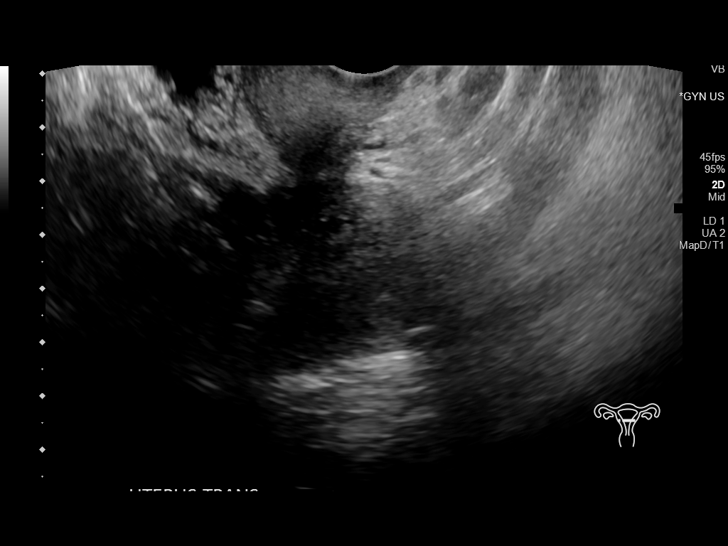
[im 31/46]
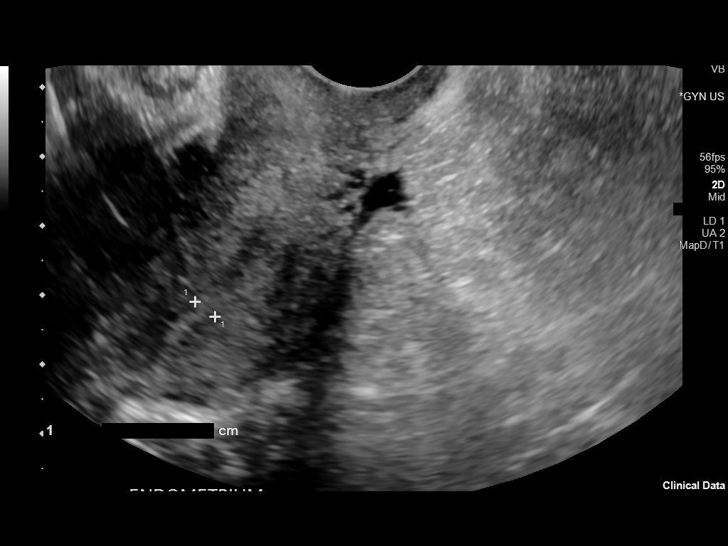
[im 34/46]
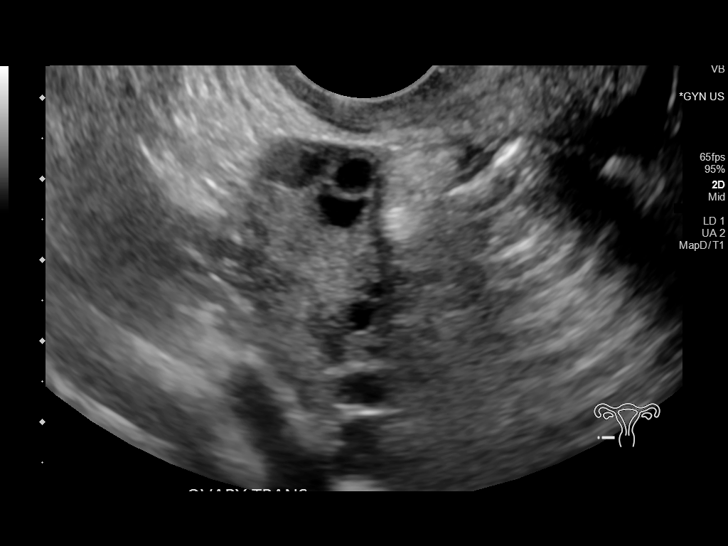
[im 38/46]
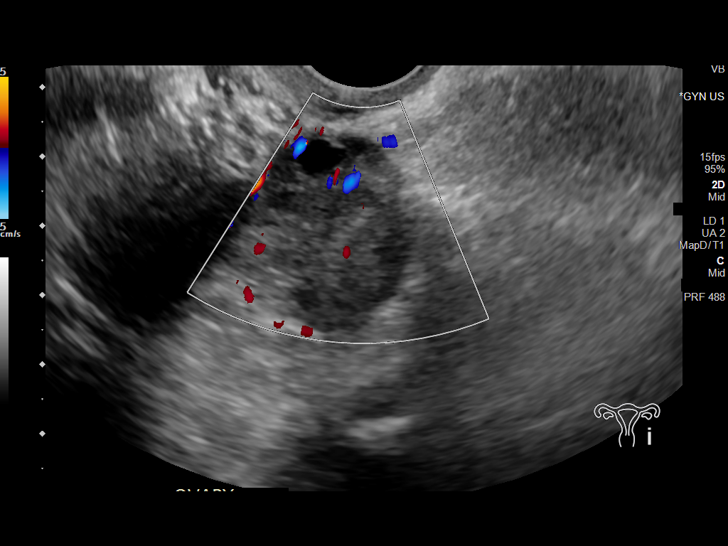
[im 42/46]
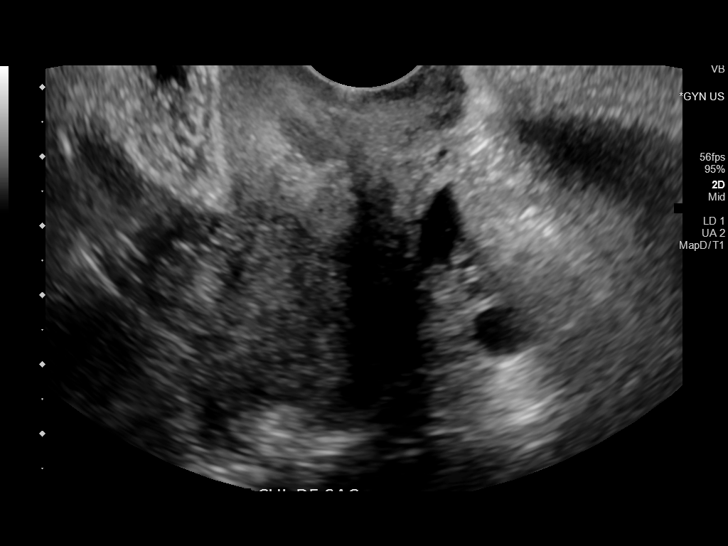
[im 46/46]
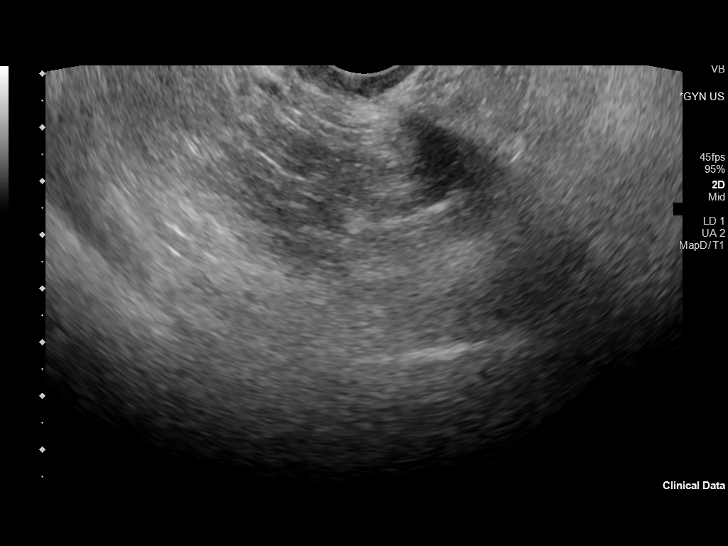

[14 of 25 positions shown; findings below may reference images not displayed]

FINDINGS: Uterus

Measurements: 5.4 x 3.1 x 3.8 cm = volume: 33 mL. Anteverted.
Heterogeneous myometrium. No focal mass.

Endometrium

Thickness: 4 mm. No endometrial fluid or mass. Trace endocervical
fluid.

Right ovary

Measurements: 2.1 x 3.3 x 1.8 cm = volume: 6.2 mL. Normal morphology
without mass

Left ovary

Measurements: 2.5 x 2.7 x 1.8 cm = volume: 6.2 mL. Normal morphology
without mass

Other findings

Trace free pelvic fluid at cul-de-sac.  No adnexal masses.
IMPRESSION: Normal exam.

## 2023-06-03 ENCOUNTER — Ambulatory Visit: Admitting: Obstetrics and Gynecology

## 2023-06-03 ENCOUNTER — Ambulatory Visit

## 2023-06-06 DIAGNOSIS — M2550 Pain in unspecified joint: Secondary | ICD-10-CM | POA: Diagnosis not present

## 2023-06-06 DIAGNOSIS — M533 Sacrococcygeal disorders, not elsewhere classified: Secondary | ICD-10-CM | POA: Diagnosis not present

## 2023-06-06 DIAGNOSIS — M797 Fibromyalgia: Secondary | ICD-10-CM | POA: Diagnosis not present

## 2023-06-06 DIAGNOSIS — M79641 Pain in right hand: Secondary | ICD-10-CM | POA: Diagnosis not present

## 2023-06-06 DIAGNOSIS — M79672 Pain in left foot: Secondary | ICD-10-CM | POA: Diagnosis not present

## 2023-06-06 DIAGNOSIS — M79642 Pain in left hand: Secondary | ICD-10-CM | POA: Diagnosis not present

## 2023-06-06 DIAGNOSIS — M79671 Pain in right foot: Secondary | ICD-10-CM | POA: Diagnosis not present

## 2023-06-10 ENCOUNTER — Ambulatory Visit

## 2023-06-12 ENCOUNTER — Telehealth: Payer: Self-pay

## 2023-06-12 ENCOUNTER — Other Ambulatory Visit: Payer: Self-pay | Admitting: *Deleted

## 2023-06-12 DIAGNOSIS — F4310 Post-traumatic stress disorder, unspecified: Secondary | ICD-10-CM | POA: Diagnosis not present

## 2023-06-12 DIAGNOSIS — F410 Panic disorder [episodic paroxysmal anxiety] without agoraphobia: Secondary | ICD-10-CM | POA: Diagnosis not present

## 2023-06-12 DIAGNOSIS — G4700 Insomnia, unspecified: Secondary | ICD-10-CM

## 2023-06-12 DIAGNOSIS — F411 Generalized anxiety disorder: Secondary | ICD-10-CM | POA: Diagnosis not present

## 2023-06-12 DIAGNOSIS — F3131 Bipolar disorder, current episode depressed, mild: Secondary | ICD-10-CM | POA: Diagnosis not present

## 2023-06-12 NOTE — Telephone Encounter (Signed)
 The Freestyle Libre 14 day, 2 and 3 are being discontinued in September 2025. The new version is 2 plus and 3 plus and are available. We are highly encouraging providers to go ahead and make the switch now. That will prevent us  from doing the prior auth on the old version now and then having to do a prior auth on the new version again in a few months. Thanks!

## 2023-06-13 ENCOUNTER — Other Ambulatory Visit: Payer: Self-pay | Admitting: *Deleted

## 2023-06-17 NOTE — Progress Notes (Deleted)
 NEUROLOGY FOLLOW UP OFFICE NOTE  Heidi Holland [id number]  Assessment/Plan:   ***  Subjective:  Heidi Holland is a 48 year old right-handed Caucasian woman with hypertension, hyperlipidemia, type 2 diabetes, PTSD/anxiety/depression, fibromyalgia and history of TIA who follows up for migraines.   UPDATE: Last seen in 2023.  At that time, she had restarted and received one treatment of Botox  that August but ***   Current NSAIDS:  ASA 325mg , Current analgesics:  hydrocodone  Current triptans:  sumatriptan  25mg  Current ergotamine:  none Current anti-emetic:  Zofran  ODT 4mg  Current muscle relaxants:  Tizanidine  4mg  PRN Current anti-anxiolytic:  Clonazepam , hydroxyzine  Current sleep aide:  trazodone  Current Antihypertensive medications:  none Current Antidepressant medications:  bupropion XL 150mg  daily Current Anticonvulsant medications:  lamotrigine 200mg  daily, Lyrica  150mg  BID, zonisamide  100mg  daily Current anti-CGRP: Current Vitamins/Herbal/Supplements:  magnesium  200mg , turmeric Current Antihistamines/Decongestants:  none Other therapy:  none Hormone/birth control:  none Other medications:  Ambien , quetiapine, clonazepam , Mounjaro    Caffeine:  Caffeine-free soda, 1 cup of coffee 2-3 times a week. Alcohol:  no Smoker:  no Diet:  Needs to improve water intake.  Skips meals.  Diet Coke daily Exercise:  Not routine Depression:  Poor.  Currently treated; Anxiety:  Poor.  Currently treated. Other pain:  Low back pain/lumbar radiculopathy Sleep hygiene:  Improved on medication   HISTORY:  Onset:  Age 10 Location: occiput into neck or top of head.   Quality:  Pressure, throbbing Initial intensity:  8/10.  She denies new headache, thunderclap headache  Aura:  no Premonitory Phase:  Wakes up with heavy eyelids and stiff neck. Postdrome:  "hangover effect" - exhausted, irritable, neck soreness Associated symptoms:  Neck stiffness, nausea, sometimes vomiting, photophobia,  phonophobia, blurred vision.  She denies associated unilateral numbness or weakness. Initial duration:  Usually 2-3 days (up to a week with various intensity) Initial frequency:  6 times a month (15 days or more a month) Initial frequency of abortive medication: something daily Triggers:  Lack of coffee, menstrual period, when low back pain aggravated, perfumes Relieving factors:  Ice pack, lay in dark Activity:  aggravates She has been to the ED on a couple of occasions for severe intractable migraine.   Past NSAIDS:  Ibuprofen, Aleve Past analgesics:  Excedrin, Tylenol , Fioricet tramadol  Past abortive triptans:   Tosymra  NS (effective but no longer covered by insurance), rizatriptan  Past abortive ergotamine:  none Past muscle relaxants:  Flexeril , Robaxin, baclofen  Past anti-emetic:  Phenergan  Past antihypertensive medications:  "a blood pressure medication" Past antidepressant medications:  Effexor, Trintellix, Rexulti Past anticonvulsant medications:  topiramate, gabapentin  Past anti-CGRP:  Aimovig  Past vitamins/Herbal/Supplements:  none Past antihistamines/decongestants:  Zyrtec  Other past therapies:  Botox  (effective)   She was told by her previous PCP that she had a transient ischemic attack in early 2019.  She was walking and her legs suddenly gave out and her arms weren't working.  She was on the floor for a few minutes.  No unilateral numbness or weakness.  When she got up, she called a friend who tested her on the phone for TIA and symptoms resolved.  She had an MRI and MRA of the brain about a month later, on 07/24/17, which report states were normal.  She was given a diagnosis of TIA and has since been on ASA 325mg  daily.  Her mother has a history of recurrent TIAs while she was treated for lung cancer.     Family history of headache:  no  PAST MEDICAL HISTORY: Past  Medical History:  Diagnosis Date   Abnormal Pap smear of cervix    Allergy    Anxiety    Asthma     Bipolar 1 disorder (HCC)    Chronic pain    Depression    Diabetes mellitus without complication (HCC)    Diverticulitis    GERD (gastroesophageal reflux disease)    HPV in female    Hyperlipidemia    Hypertension    Migraines    Osteoarthritis    Ovarian cyst    right   PCOS (polycystic ovarian syndrome)    STD (sexually transmitted disease)    Trichomonas contact, treated    Vertigo     MEDICATIONS: Current Outpatient Medications on File Prior to Visit  Medication Sig Dispense Refill   albuterol  (VENTOLIN  HFA) 108 (90 Base) MCG/ACT inhaler Inhale 1-2 puffs into the lungs every 6 (six) hours as needed for wheezing or shortness of breath. 8.5 each 2   AUVELITY 45-105 MG TBCR Take by mouth.     azelastine  (ASTELIN ) 0.1 % nasal spray Place 2 sprays into both nostrils 2 (two) times daily. 30 mL 12   clonazePAM  (KLONOPIN ) 0.5 MG tablet Take 1 tablet (0.5 mg total) by mouth 2 (two) times daily as needed. for anxiety 60 tablet 0   HYDROcodone -acetaminophen  (NORCO) 10-325 MG tablet Take 1 tablet by mouth every 6 (six) hours as needed.     losartan  (COZAAR ) 50 MG tablet Take 1 tablet (50 mg total) by mouth daily. 90 tablet 1   lumateperone tosylate (CAPLYTA) 42 MG capsule      metFORMIN  (GLUCOPHAGE ) 1000 MG tablet TAKE 1 TABLET BY MOUTH TWICE A DAY WITH FOOD 60 tablet 5   methocarbamol (ROBAXIN) 750 MG tablet Take 1 tablet by mouth 3 (three) times daily as needed.     ondansetron  (ZOFRAN -ODT) 4 MG disintegrating tablet TAKE 1 TABLET (4 MG TOTAL) BY MOUTH EVERY 6 (SIX) HOURS AS NEEDED FOR NAUSEA OR VOMITING 18 tablet 11   pregabalin  (LYRICA ) 150 MG capsule TAKE 1 CAPSULE BY MOUTH TWICE A DAY 60 capsule 5   QUEtiapine (SEROQUEL) 200 MG tablet Take 2 tablets by mouth at bedtime.     rosuvastatin  (CRESTOR ) 5 MG tablet Take 1 tablet (5 mg total) by mouth daily. 30 tablet 3   SUMAtriptan  (IMITREX ) 25 MG tablet TAKE 1 TABLET DAILY AS NEEDED FOR MIGRAINE, MAY REPEAT IN 2 HOURS IF HEADACHE PERSISTS  OR RECURS. 9 tablet 2   tirzepatide  (MOUNJARO ) 7.5 MG/0.5ML Pen Inject 7.5 mg into the skin once a week. 6 mL 1   tiZANidine  (ZANAFLEX ) 4 MG tablet Take 1 tablet (4 mg total) by mouth 3 (three) times daily. 90 tablet 5   zolpidem  (AMBIEN ) 5 MG tablet Take 1 tablet (5 mg total) by mouth at bedtime as needed for sleep. 30 tablet 5   No current facility-administered medications on file prior to visit.    ALLERGIES: Allergies  Allergen Reactions   Ciprofloxacin Itching    FAMILY HISTORY: Family History  Problem Relation Age of Onset   Stroke Mother    Heart disease Mother    Lung cancer Mother    Rheum arthritis Mother    Arthritis Mother    COPD Mother    Fibromyalgia Mother    Hypertension Father    Diabetes Father    Heart disease Father    Hodgkin's lymphoma Father    Endometriosis Sister    Drug abuse Brother  died of overdose   Breast cancer Paternal Aunt    Diabetes Maternal Grandmother    Lung cancer Maternal Grandmother    Other Maternal Grandmother        pace maker   Skin cancer Paternal Grandmother       Objective:  *** General: No acute distress.  Patient appears ***-groomed.   Head:  Normocephalic/atraumatic Eyes:  Fundi examined but not visualized Neck: supple, no paraspinal tenderness, full range of motion Heart:  Regular rate and rhythm Lungs:  Clear to auscultation bilaterally Back: No paraspinal tenderness Neurological Exam: alert and oriented.  Speech fluent and not dysarthric, language intact.  CN II-XII intact. Bulk and tone normal, muscle strength 5/5 throughout.  Sensation to light touch intact.  Deep tendon reflexes 2+ throughout, toes downgoing.  Finger to nose testing intact.  Gait normal, Romberg negative.   Janne Members, DO  CC: ***

## 2023-06-18 ENCOUNTER — Ambulatory Visit: Admitting: Neurology

## 2023-06-19 ENCOUNTER — Other Ambulatory Visit: Payer: Self-pay | Admitting: *Deleted

## 2023-06-19 MED ORDER — FREESTYLE LIBRE 3 PLUS SENSOR MISC
3 refills | Status: DC
Start: 2023-06-19 — End: 2023-10-18

## 2023-06-19 NOTE — Telephone Encounter (Signed)
 Rx send to pharmacy

## 2023-06-21 ENCOUNTER — Ambulatory Visit: Attending: Cardiology | Admitting: Cardiology

## 2023-06-26 ENCOUNTER — Encounter (HOSPITAL_BASED_OUTPATIENT_CLINIC_OR_DEPARTMENT_OTHER): Payer: Self-pay

## 2023-06-27 ENCOUNTER — Other Ambulatory Visit: Payer: Self-pay

## 2023-06-27 ENCOUNTER — Ambulatory Visit (INDEPENDENT_AMBULATORY_CARE_PROVIDER_SITE_OTHER): Admitting: Family Medicine

## 2023-06-27 VITALS — BP 130/70 | HR 80 | Temp 98.0°F | Ht 62.0 in | Wt 187.8 lb

## 2023-06-27 DIAGNOSIS — Z7985 Long-term (current) use of injectable non-insulin antidiabetic drugs: Secondary | ICD-10-CM | POA: Diagnosis not present

## 2023-06-27 DIAGNOSIS — E538 Deficiency of other specified B group vitamins: Secondary | ICD-10-CM | POA: Diagnosis not present

## 2023-06-27 DIAGNOSIS — M25569 Pain in unspecified knee: Secondary | ICD-10-CM

## 2023-06-27 DIAGNOSIS — E1169 Type 2 diabetes mellitus with other specified complication: Secondary | ICD-10-CM

## 2023-06-27 DIAGNOSIS — M797 Fibromyalgia: Secondary | ICD-10-CM | POA: Insufficient documentation

## 2023-06-27 DIAGNOSIS — D509 Iron deficiency anemia, unspecified: Secondary | ICD-10-CM | POA: Diagnosis not present

## 2023-06-27 DIAGNOSIS — I10 Essential (primary) hypertension: Secondary | ICD-10-CM

## 2023-06-27 DIAGNOSIS — M2550 Pain in unspecified joint: Secondary | ICD-10-CM

## 2023-06-27 DIAGNOSIS — Z0000 Encounter for general adult medical examination without abnormal findings: Secondary | ICD-10-CM

## 2023-06-27 DIAGNOSIS — J4520 Mild intermittent asthma, uncomplicated: Secondary | ICD-10-CM

## 2023-06-27 DIAGNOSIS — Z0001 Encounter for general adult medical examination with abnormal findings: Secondary | ICD-10-CM

## 2023-06-27 DIAGNOSIS — E785 Hyperlipidemia, unspecified: Secondary | ICD-10-CM | POA: Diagnosis not present

## 2023-06-27 DIAGNOSIS — Z9884 Bariatric surgery status: Secondary | ICD-10-CM | POA: Diagnosis not present

## 2023-06-27 DIAGNOSIS — Z96642 Presence of left artificial hip joint: Secondary | ICD-10-CM

## 2023-06-27 DIAGNOSIS — E119 Type 2 diabetes mellitus without complications: Secondary | ICD-10-CM

## 2023-06-27 DIAGNOSIS — F325 Major depressive disorder, single episode, in full remission: Secondary | ICD-10-CM

## 2023-06-27 DIAGNOSIS — F419 Anxiety disorder, unspecified: Secondary | ICD-10-CM

## 2023-06-27 DIAGNOSIS — Z13220 Encounter for screening for lipoid disorders: Secondary | ICD-10-CM

## 2023-06-27 LAB — CBC WITH DIFFERENTIAL/PLATELET
Basophils Absolute: 0 10*3/uL (ref 0.0–0.1)
Basophils Relative: 0.4 % (ref 0.0–3.0)
Eosinophils Absolute: 0.2 10*3/uL (ref 0.0–0.7)
Eosinophils Relative: 2.3 % (ref 0.0–5.0)
HCT: 35.1 % — ABNORMAL LOW (ref 36.0–46.0)
Hemoglobin: 11.3 g/dL — ABNORMAL LOW (ref 12.0–15.0)
Lymphocytes Relative: 24.2 % (ref 12.0–46.0)
Lymphs Abs: 1.8 10*3/uL (ref 0.7–4.0)
MCHC: 32.1 g/dL (ref 30.0–36.0)
MCV: 70.9 fl — ABNORMAL LOW (ref 78.0–100.0)
Monocytes Absolute: 0.6 10*3/uL (ref 0.1–1.0)
Monocytes Relative: 8.5 % (ref 3.0–12.0)
Neutro Abs: 4.9 10*3/uL (ref 1.4–7.7)
Neutrophils Relative %: 64.6 % (ref 43.0–77.0)
Platelets: 292 10*3/uL (ref 150.0–400.0)
RBC: 4.95 Mil/uL (ref 3.87–5.11)
RDW: 16.4 % — ABNORMAL HIGH (ref 11.5–15.5)
WBC: 7.5 10*3/uL (ref 4.0–10.5)

## 2023-06-27 LAB — HEMOGLOBIN A1C: Hgb A1c MFr Bld: 6.1 % (ref 4.6–6.5)

## 2023-06-27 LAB — SEDIMENTATION RATE: Sed Rate: 18 mm/h (ref 0–20)

## 2023-06-27 MED ORDER — TIRZEPATIDE 10 MG/0.5ML ~~LOC~~ SOAJ: 10.0000 mg | SUBCUTANEOUS | 3 refills | Status: DC

## 2023-06-27 MED ORDER — DESONIDE 0.05 % EX CREA: TOPICAL_CREAM | Freq: Two times a day (BID) | CUTANEOUS | 5 refills | Status: DC

## 2023-06-27 NOTE — Assessment & Plan Note (Signed)
 Continue management per psychiatry.  Symptoms are stable.

## 2023-06-27 NOTE — Assessment & Plan Note (Signed)
 Doing well with Mounjaro .  Will increase to 10 mg weekly.  Check A1c today.

## 2023-06-27 NOTE — Assessment & Plan Note (Signed)
 And lengthy discussion with patient today regarding her polyarthralgia.  She has previously seen Dr. Coley Davis at [facility] who was concerned about fibromyalgia or potential underlying autoimmune condition.  She did have mildly positive ANA earlier this year as well.  We referred her to see rheumatology and they saw her a couple of months ago.  They thought that her presentation was likely most consistent with fibromyalgia.  They did request additional labs however they were not drawn at that time.  We will check additional labs today per patient request.  Will also place referral for her to establish with rheumatology locally.

## 2023-06-27 NOTE — Assessment & Plan Note (Signed)
 Check labs

## 2023-06-27 NOTE — Progress Notes (Signed)
 Chief Complaint:  Heidi Holland is a 48 y.o. female who presents today for her annual comprehensive physical exam.    Assessment/Plan:  Chronic Problems Addressed Today: Polyarthralgia And lengthy discussion with patient today regarding her polyarthralgia.  She has previously seen Dr. Coley Davis at [facility] who was concerned about fibromyalgia or potential underlying autoimmune condition.  She did have mildly positive ANA earlier this year as well.  We referred her to see rheumatology and they saw her a couple of months ago.  They thought that her presentation was likely most consistent with fibromyalgia.  They did request additional labs however they were not drawn at that time.  We will check additional labs today per patient request.  Will also place referral for her to establish with rheumatology locally.  T2DM (type 2 diabetes mellitus) (HCC) Doing well with Mounjaro .  Will increase to 10 mg weekly.  Check A1c today.  Dyslipidemia associated with type 2 diabetes mellitus (HCC) Check lipids.  Iron deficiency anemia Check iron panel.  Anxiety Continue management per psychiatry.  Symptoms are stable.  Depression, major, single episode, complete remission (HCC) Continue management per psychiatry.  Symptoms are stable.  B12 deficiency Check B12.  Essential hypertension At goal today on losartan  50 mg daily.  Check labs.  Mild intermittent asthma, uncomplicated Stable.  No recent layers.  On albuterol  as needed.  S/P gastric bypass Check labs.  Fibromyalgia Dermatology felt that her symptoms are most consistent with fibromyalgia.  She is currently on Lyrica  150 mg twice daily.  We are ruling out other potential autoimmune causes as above and referring her to see rheumatology.  Preventative Healthcare: Due for Prevnar - she can get this at the pharmacy.  Check labs.  She will be establishing with GYN later this year for women's health.  We can give shingles vaccine at age 33.   Up-to-date on other vaccines and screenings.  Patient Counseling(The following topics were reviewed and/or handout was given):  -Nutrition: Stressed importance of moderation in sodium/caffeine intake, saturated fat and cholesterol, caloric balance, sufficient intake of fresh fruits, vegetables, and fiber.  -Stressed the importance of regular exercise.   -Substance Abuse: Discussed cessation/primary prevention of tobacco, alcohol, or other drug use; driving or other dangerous activities under the influence; availability of treatment for abuse.   -Injury prevention: Discussed safety belts, safety helmets, smoke detector, smoking near bedding or upholstery.   -Sexuality: Discussed sexually transmitted diseases, partner selection, use of condoms, avoidance of unintended pregnancy and contraceptive alternatives.   -Dental health: Discussed importance of regular tooth brushing, flossing, and dental visits.  -Health maintenance and immunizations reviewed. Please refer to Health maintenance section.  Return to care in 1 year for next preventative visit.     Subjective:  HPI:  Patient here today for annual physical.  I last saw her a few months ago.  Since her last visit she has been seen by cardiology and rheumatology.  She has had longstanding issues with polyarthralgia.  She saw rheumatology several weeks ago.  They performed x-rays at that time which were negative.  At that time it was thought that her symptoms may be consistent with fibromyalgia.  They did recommend that she have repeat labs done however these were not drawn at that time.  She would like for us  to check blood work today.  Still has persistent pain in most joints in her body.  Still having quite a bit of knee pain as well.  She has not had any recent  changes in medication.  Overall symptoms are stable compared to her last visit.  She is doing well with the rest of her medications.  She is currently on Mounjaro  7.5 mg weekly.   Tolerating well without significant side effects.  Occasionally does have some low sugar readings at night but this is manageable.  She is interested in increasing the dose.  She is down about 12 pounds since her last visit here.  She would also like to be referred to see an ophthalmologist for yearly eye exams.  She has also noticed dry skin and rash on her skin intermittently for several months to years.  Typically uses lotion though still has a few areas of irritation and inflammation.     06/27/2023    9:20 AM  Depression screen PHQ 2/9  Decreased Interest 0  Down, Depressed, Hopeless 0  PHQ - 2 Score 0  Altered sleeping 0  Tired, decreased energy 0  Change in appetite 0  Feeling bad or failure about yourself  0  Trouble concentrating 0  Moving slowly or fidgety/restless 0  Suicidal thoughts 0  PHQ-9 Score 0  Difficult doing work/chores Not difficult at all    Health Maintenance Due  Topic Date Due   OPHTHALMOLOGY EXAM  Never done   Pneumococcal Vaccine 87-69 Years old (1 of 2 - PCV) Never done   FOOT EXAM  09/17/2019   Diabetic kidney evaluation - Urine ACR  07/24/2023     ROS: Per HPI, otherwise a complete review of systems was negative.   PMH:  The following were reviewed and entered/updated in epic: Past Medical History:  Diagnosis Date   Abnormal Pap smear of cervix    Allergy    Anxiety    Asthma    Bipolar 1 disorder (HCC)    Chronic pain    Depression    Diabetes mellitus without complication (HCC)    Diverticulitis    GERD (gastroesophageal reflux disease)    HPV in female    Hyperlipidemia    Hypertension    Migraines    Osteoarthritis    Ovarian cyst    right   PCOS (polycystic ovarian syndrome)    STD (sexually transmitted disease)    Trichomonas contact, treated    Vertigo    Patient Active Problem List   Diagnosis Date Noted   Fibromyalgia 06/27/2023   Ovarian cyst 02/20/2023   Essential hypertension 02/20/2023   Bipolar 1 disorder,  depressed (HCC) 02/16/2022   Paresthesia 09/13/2021   CIN I (cervical intraepithelial neoplasia I) 03/03/2021   LSIL pap smear of cervix/human papillomavirus (HPV) positive 10/24/2020   Family history of early CAD 10/24/2020   B12 deficiency 07/26/2020   Somatic dysfunction of spine, cervical 07/21/2020   Status post left hip replacement 03/12/2019   T2DM (type 2 diabetes mellitus) (HCC) 07/31/2018   Dyslipidemia associated with type 2 diabetes mellitus (HCC) 07/31/2018   Allergic rhinitis 07/31/2018   Mild intermittent asthma, uncomplicated 07/31/2018   GERD (gastroesophageal reflux disease) 07/31/2018   S/P gastric bypass 07/31/2018   Migraine 07/31/2018   Anxiety 07/31/2018   Depression, major, single episode, complete remission (HCC) 07/31/2018   Insomnia 07/31/2018   Umbilical hernia 07/31/2018   Iron deficiency anemia 06/09/2018   Cervicogenic headache 05/12/2018   Low back pain 05/12/2018   Polyarthralgia 05/12/2018   Past Surgical History:  Procedure Laterality Date   BOWEL RESECTION     gasticbypass     GASTRIC BYPASS     KNEE  ARTHROSCOPY Bilateral    SHOULDER SURGERY Left    TOTAL HIP ARTHROPLASTY Left     Family History  Problem Relation Age of Onset   Stroke Mother    Heart disease Mother    Lung cancer Mother    Rheum arthritis Mother    Arthritis Mother    COPD Mother    Fibromyalgia Mother    Hypertension Father    Diabetes Father    Heart disease Father    Hodgkin's lymphoma Father    Endometriosis Sister    Drug abuse Brother        died of overdose   Breast cancer Paternal Aunt    Diabetes Maternal Grandmother    Lung cancer Maternal Grandmother    Other Maternal Grandmother        pace maker   Skin cancer Paternal Grandmother     Medications- reviewed and updated Current Outpatient Medications  Medication Sig Dispense Refill   albuterol  (VENTOLIN  HFA) 108 (90 Base) MCG/ACT inhaler Inhale 1-2 puffs into the lungs every 6 (six) hours as  needed for wheezing or shortness of breath. 8.5 each 2   AUVELITY 45-105 MG TBCR Take by mouth.     azelastine  (ASTELIN ) 0.1 % nasal spray Place 2 sprays into both nostrils 2 (two) times daily. 30 mL 12   clonazePAM  (KLONOPIN ) 0.5 MG tablet Take 1 tablet (0.5 mg total) by mouth 2 (two) times daily as needed. for anxiety 60 tablet 0   Continuous Glucose Sensor (FREESTYLE LIBRE 3 PLUS SENSOR) MISC Change sensor every 15 days. 2 each 3   desonide (DESOWEN) 0.05 % cream Apply topically 2 (two) times daily. 30 g 5   HYDROcodone -acetaminophen  (NORCO) 10-325 MG tablet Take 1 tablet by mouth every 6 (six) hours as needed.     losartan  (COZAAR ) 50 MG tablet Take 1 tablet (50 mg total) by mouth daily. 90 tablet 1   lumateperone tosylate (CAPLYTA) 42 MG capsule      methocarbamol (ROBAXIN) 750 MG tablet Take 1 tablet by mouth 3 (three) times daily as needed.     ondansetron  (ZOFRAN -ODT) 4 MG disintegrating tablet TAKE 1 TABLET (4 MG TOTAL) BY MOUTH EVERY 6 (SIX) HOURS AS NEEDED FOR NAUSEA OR VOMITING 18 tablet 11   pregabalin  (LYRICA ) 150 MG capsule TAKE 1 CAPSULE BY MOUTH TWICE A DAY 60 capsule 5   QUEtiapine (SEROQUEL) 200 MG tablet Take 2 tablets by mouth at bedtime.     rosuvastatin  (CRESTOR ) 5 MG tablet Take 1 tablet (5 mg total) by mouth daily. 30 tablet 3   SUMAtriptan  (IMITREX ) 25 MG tablet TAKE 1 TABLET DAILY AS NEEDED FOR MIGRAINE, MAY REPEAT IN 2 HOURS IF HEADACHE PERSISTS OR RECURS. 9 tablet 2   tirzepatide  (MOUNJARO ) 10 MG/0.5ML Pen Inject 10 mg into the skin once a week. 6 mL 3   tiZANidine  (ZANAFLEX ) 4 MG tablet Take 1 tablet (4 mg total) by mouth 3 (three) times daily. 90 tablet 5   zolpidem  (AMBIEN ) 5 MG tablet Take 1 tablet (5 mg total) by mouth at bedtime as needed for sleep. 30 tablet 5   No current facility-administered medications for this visit.    Allergies-reviewed and updated Allergies  Allergen Reactions   Ciprofloxacin Itching    Social History   Socioeconomic History    Marital status: Single    Spouse name: Not on file   Number of children: 0   Years of education: 14   Highest education level: Associate degree: academic program  Occupational History    Employer: [employer]  Tobacco Use   Smoking status: Never   Smokeless tobacco: Never  Vaping Use   Vaping status: Never Used  Substance and Sexual Activity   Alcohol use: Not Currently   Drug use: Yes    Types: Marijuana    Comment: occ   Sexual activity: Not Currently    Partners: Male    Birth control/protection: Abstinence  Other Topics Concern   Not on file  Social History Narrative   Right handed.    Social Drivers of Health   Financial Resource Strain: High Risk (06/27/2023)   Overall Financial Resource Strain (CARDIA)    Difficulty of Paying Living Expenses: Very hard  Food Insecurity: Food Insecurity Present (06/27/2023)   Hunger Vital Sign    Worried About Running Out of Food in the Last Year: Often true    Ran Out of Food in the Last Year: Sometimes true  Transportation Needs: Unmet Transportation Needs (06/27/2023)   PRAPARE - Administrator, Civil Service (Medical): No    Lack of Transportation (Non-Medical): Yes  Physical Activity: Unknown (06/27/2023)   Exercise Vital Sign    Days of Exercise per Week: 0 days    Minutes of Exercise per Session: Not on file  Stress: Stress Concern Present (06/27/2023)   [employer] of Occupational Health - Occupational Stress Questionnaire    Feeling of Stress : Rather much  Social Connections: Socially Isolated (06/27/2023)   Social Connection and Isolation Panel [NHANES]    Frequency of Communication with Friends and Family: Three times a week    Frequency of Social Gatherings with Friends and Family: Once a week    Attends Religious Services: Never    Database administrator or Organizations: No    Attends Engineer, structural: Not on file    Marital Status: Never married        Objective:  Physical Exam: BP  130/70   Pulse 80   Temp 98 F (36.7 C) (Temporal)   Ht 5\' 2"  (1.575 m)   Wt 187 lb 12.8 oz (85.2 kg)   LMP 06/10/2023 (Approximate)   SpO2 98%   BMI 34.35 kg/m   Body mass index is 34.35 kg/m. Wt Readings from Last 3 Encounters:  06/27/23 187 lb 12.8 oz (85.2 kg)  04/24/23 187 lb (84.8 kg)  04/18/23 189 lb (85.7 kg)   Gen: NAD, resting comfortably HEENT: TMs normal bilaterally. OP clear. No thyromegaly noted.  CV: RRR with no murmurs appreciated Pulm: NWOB, CTAB with no crackles, wheezes, or rhonchi GI: Normal bowel sounds present. Soft, Nontender, Nondistended. MSK: no edema, cyanosis, or clubbing noted Skin: warm, dry Neuro: CN2-12 grossly intact. Strength 5/5 in upper and lower extremities. Reflexes symmetric and intact bilaterally.  Psych: Normal affect and thought content     Rodney Wigger M. Daneil Dunker, MD 06/27/2023 10:13 AM

## 2023-06-27 NOTE — Assessment & Plan Note (Signed)
 Check lipids

## 2023-06-27 NOTE — Assessment & Plan Note (Signed)
 Check iron panel

## 2023-06-27 NOTE — Assessment & Plan Note (Signed)
 Stable.  No recent layers.  On albuterol  as needed.

## 2023-06-27 NOTE — Patient Instructions (Signed)
 It was very nice to see you today!  We will check blood work today.  Will increase your Mounjaro  to 10 mg weekly.  Please try the desonide for your rash.  I will refer you to see the ophthalmologist, sports medicine office, and rheumatologist.  Please continue to work on diet and exercise.  Will see back in year for your next physical.  Will probably see you back in 3 to 6 months to check your A1c depending on today's results.  Return in about 6 months (around 12/28/2023).   Take care, Dr Daneil Dunker  PLEASE NOTE:  If you had any lab tests, please let us  know if you have not heard back within a few days. You may see your results on mychart before we have a chance to review them but we will give you a call once they are reviewed by us .   If we ordered any referrals today, please let us  know if you have not heard from their office within the next week.   If you had any urgent prescriptions sent in today, please check with the pharmacy within an hour of our visit to make sure the prescription was transmitted appropriately.   Please try these tips to maintain a healthy lifestyle:  Eat at least 3 REAL meals and 1-2 snacks per day.  Aim for no more than 5 hours between eating.  If you eat breakfast, please do so within one hour of getting up.   Each meal should contain half fruits/vegetables, one quarter protein, and one quarter carbs (no bigger than a computer mouse)  Cut down on sweet beverages. This includes juice, soda, and sweet tea.   Drink at least 1 glass of water with each meal and aim for at least 8 glasses per day  Exercise at least 150 minutes every week.    Preventive Care 65-20 Years Old, Female Preventive care refers to lifestyle choices and visits with your health care provider that can promote health and wellness. Preventive care visits are also called wellness exams. What can I expect for my preventive care visit? Counseling Your health care provider may ask you  questions about your: Medical history, including: Past medical problems. Family medical history. Pregnancy history. Current health, including: Menstrual cycle. Method of birth control. Emotional well-being. Home life and relationship well-being. Sexual activity and sexual health. Lifestyle, including: Alcohol, nicotine or tobacco, and drug use. Access to firearms. Diet, exercise, and sleep habits. Work and work Astronomer. Sunscreen use. Safety issues such as seatbelt and bike helmet use. Physical exam Your health care provider will check your: Height and weight. These may be used to calculate your BMI (body mass index). BMI is a measurement that tells if you are at a healthy weight. Waist circumference. This measures the distance around your waistline. This measurement also tells if you are at a healthy weight and may help predict your risk of certain diseases, such as type 2 diabetes and high blood pressure. Heart rate and blood pressure. Body temperature. Skin for abnormal spots. What immunizations do I need?  Vaccines are usually given at various ages, according to a schedule. Your health care provider will recommend vaccines for you based on your age, medical history, and lifestyle or other factors, such as travel or where you work. What tests do I need? Screening Your health care provider may recommend screening tests for certain conditions. This may include: Lipid and cholesterol levels. Diabetes screening. This is done by checking your blood sugar (glucose) after  you have not eaten for a while (fasting). Pelvic exam and Pap test. Hepatitis B test. Hepatitis C test. HIV (human immunodeficiency virus) test. STI (sexually transmitted infection) testing, if you are at risk. Lung cancer screening. Colorectal cancer screening. Mammogram. Talk with your health care provider about when you should start having regular mammograms. This may depend on whether you have a family  history of breast cancer. BRCA-related cancer screening. This may be done if you have a family history of breast, ovarian, tubal, or peritoneal cancers. Bone density scan. This is done to screen for osteoporosis. Talk with your health care provider about your test results, treatment options, and if necessary, the need for more tests. Follow these instructions at home: Eating and drinking  Eat a diet that includes fresh fruits and vegetables, whole grains, lean protein, and low-fat dairy products. Take vitamin and mineral supplements as recommended by your health care provider. Do not drink alcohol if: Your health care provider tells you not to drink. You are pregnant, may be pregnant, or are planning to become pregnant. If you drink alcohol: Limit how much you have to 0-1 drink a day. Know how much alcohol is in your drink. In the [place] one drink equals one 12 oz bottle of beer (355 mL), one 5 oz glass of wine (148 mL), or one 1 oz glass of hard liquor (44 mL). Lifestyle Brush your teeth every morning and night with fluoride toothpaste. Floss one time each day. Exercise for at least 30 minutes 5 or more days each week. Do not use any products that contain nicotine or tobacco. These products include cigarettes, chewing tobacco, and vaping devices, such as e-cigarettes. If you need help quitting, ask your health care provider. Do not use drugs. If you are sexually active, practice safe sex. Use a condom or other form of protection to prevent STIs. If you do not wish to become pregnant, use a form of birth control. If you plan to become pregnant, see your health care provider for a prepregnancy visit. Take aspirin only as told by your health care provider. Make sure that you understand how much to take and what form to take. Work with your health care provider to find out whether it is safe and beneficial for you to take aspirin daily. Find healthy ways to manage stress, such as: Meditation,  yoga, or listening to music. Journaling. Talking to a trusted person. Spending time with friends and family. Minimize exposure to UV radiation to reduce your risk of skin cancer. Safety Always wear your seat belt while driving or riding in a vehicle. Do not drive: If you have been drinking alcohol. Do not ride with someone who has been drinking. When you are tired or distracted. While texting. If you have been using any mind-altering substances or drugs. Wear a helmet and other protective equipment during sports activities. If you have firearms in your house, make sure you follow all gun safety procedures. Seek help if you have been physically or sexually abused. What's next? Visit your health care provider once a year for an annual wellness visit. Ask your health care provider how often you should have your eyes and teeth checked. Stay up to date on all vaccines. This information is not intended to replace advice given to you by your health care provider. Make sure you discuss any questions you have with your health care provider. Document Revised: 07/27/2020 Document Reviewed: 07/27/2020 Elsevier Patient Education  2024 [employer].

## 2023-06-27 NOTE — Assessment & Plan Note (Signed)
 Dermatology felt that her symptoms are most consistent with fibromyalgia.  She is currently on Lyrica  150 mg twice daily.  We are ruling out other potential autoimmune causes as above and referring her to see rheumatology.

## 2023-06-27 NOTE — Assessment & Plan Note (Signed)
 At goal today on losartan  50 mg daily.  Check labs.

## 2023-06-27 NOTE — Assessment & Plan Note (Signed)
 Check B12

## 2023-06-28 ENCOUNTER — Other Ambulatory Visit: Payer: Self-pay | Admitting: Family Medicine

## 2023-06-28 LAB — COMPREHENSIVE METABOLIC PANEL WITH GFR
AG Ratio: 1.7 (calc) (ref 1.0–2.5)
ALT: 12 U/L (ref 6–29)
AST: 14 U/L (ref 10–35)
Albumin: 4.5 g/dL (ref 3.6–5.1)
Alkaline phosphatase (APISO): 68 U/L (ref 31–125)
BUN: 14 mg/dL (ref 7–25)
CO2: 23 mmol/L (ref 20–32)
Calcium: 9.5 mg/dL (ref 8.6–10.2)
Chloride: 104 mmol/L (ref 98–110)
Creat: 0.73 mg/dL (ref 0.50–0.99)
Globulin: 2.6 g/dL (ref 1.9–3.7)
Glucose, Bld: 106 mg/dL — ABNORMAL HIGH (ref 65–99)
Potassium: 4.3 mmol/L (ref 3.5–5.3)
Sodium: 137 mmol/L (ref 135–146)
Total Bilirubin: 0.3 mg/dL (ref 0.2–1.2)
Total Protein: 7.1 g/dL (ref 6.1–8.1)
eGFR: 102 mL/min/{1.73_m2} (ref >=60)

## 2023-06-28 LAB — LIPID PANEL
Cholesterol: 136 mg/dL (ref <200)
HDL: 52 mg/dL (ref >=50)
LDL Cholesterol (Calc): 69 mg/dL
Non-HDL Cholesterol (Calc): 84 mg/dL (ref <130)
Total CHOL/HDL Ratio: 2.6 (calc) (ref <5.0)
Triglycerides: 70 mg/dL (ref <150)

## 2023-06-28 LAB — FOLATE: Folate: 13.6 ng/mL

## 2023-06-28 LAB — ANA COMPREHENSIVE PANEL
Anti JO-1: <0.2 AI (ref 0.0–0.9)
Centromere Ab Screen: <0.2 AI (ref 0.0–0.9)
Chromatin Ab SerPl-aCnc: <0.2 AI (ref 0.0–0.9)
ENA RNP Ab: <0.2 AI (ref 0.0–0.9)
ENA SM Ab Ser-aCnc: <0.2 AI (ref 0.0–0.9)
ENA SSA (RO) Ab: <0.2 AI (ref 0.0–0.9)
ENA SSB (LA) Ab: <0.2 AI (ref 0.0–0.9)
Scleroderma (Scl-70) (ENA) Antibody, IgG: <0.2 AI (ref 0.0–0.9)
dsDNA Ab: 1 [IU]/mL (ref 0–9)

## 2023-06-28 LAB — C-REACTIVE PROTEIN: CRP: <3 mg/L (ref <8.0)

## 2023-06-28 LAB — MAGNESIUM: Magnesium: 2.3 mg/dL (ref 1.5–2.5)

## 2023-06-28 LAB — VITAMIN D 25 HYDROXY (VIT D DEFICIENCY, FRACTURES): Vit D, 25-Hydroxy: 38 ng/mL (ref 30–100)

## 2023-06-28 LAB — TSH: TSH: 0.88 m[IU]/L

## 2023-06-28 LAB — VITAMIN B12: Vitamin B-12: 458 pg/mL (ref 200–1100)

## 2023-06-30 ENCOUNTER — Other Ambulatory Visit: Payer: Self-pay | Admitting: Family Medicine

## 2023-07-01 ENCOUNTER — Ambulatory Visit: Payer: Self-pay | Admitting: Family Medicine

## 2023-07-01 NOTE — Progress Notes (Signed)
 We are still waiting on several of her labs to come back however of the ones that we have received, her vitamin D , B12, folate, magnesium , and electrolytes are all at goal.  Her CRP which is an inflammatory marker was negative as well.

## 2023-07-02 ENCOUNTER — Ambulatory Visit: Payer: Self-pay | Admitting: Family Medicine

## 2023-07-02 NOTE — Progress Notes (Signed)
 We are still waiting on some test to come back however all of her autoimmune tests are negative.  She is still iron deficient.  We can refer her to hematology to discuss iron infusions if she is interested.  I am concerned that her body may not be able to absorb iron due to her gastric history.  Her A1c is much better at 6.1.  We can check this again in 3 to 6 months

## 2023-07-03 NOTE — Telephone Encounter (Signed)
 Her TSH is at goal. She can come back to recheck this and free T4 and free T3 if she wishes.  Please place referral hematology.

## 2023-07-03 NOTE — Telephone Encounter (Signed)
 See note

## 2023-07-04 ENCOUNTER — Other Ambulatory Visit: Payer: Self-pay | Admitting: *Deleted

## 2023-07-04 ENCOUNTER — Telehealth: Payer: Self-pay | Admitting: *Deleted

## 2023-07-04 DIAGNOSIS — M791 Myalgia, unspecified site: Secondary | ICD-10-CM | POA: Diagnosis not present

## 2023-07-04 DIAGNOSIS — M5416 Radiculopathy, lumbar region: Secondary | ICD-10-CM | POA: Diagnosis not present

## 2023-07-04 DIAGNOSIS — M25552 Pain in left hip: Secondary | ICD-10-CM | POA: Diagnosis not present

## 2023-07-04 DIAGNOSIS — G894 Chronic pain syndrome: Secondary | ICD-10-CM | POA: Diagnosis not present

## 2023-07-04 DIAGNOSIS — M2550 Pain in unspecified joint: Secondary | ICD-10-CM

## 2023-07-04 DIAGNOSIS — Z79899 Other long term (current) drug therapy: Secondary | ICD-10-CM | POA: Diagnosis not present

## 2023-07-04 DIAGNOSIS — D509 Iron deficiency anemia, unspecified: Secondary | ICD-10-CM

## 2023-07-04 DIAGNOSIS — G4700 Insomnia, unspecified: Secondary | ICD-10-CM | POA: Diagnosis not present

## 2023-07-04 DIAGNOSIS — M79672 Pain in left foot: Secondary | ICD-10-CM | POA: Diagnosis not present

## 2023-07-04 DIAGNOSIS — M5417 Radiculopathy, lumbosacral region: Secondary | ICD-10-CM | POA: Diagnosis not present

## 2023-07-04 DIAGNOSIS — M546 Pain in thoracic spine: Secondary | ICD-10-CM | POA: Diagnosis not present

## 2023-07-04 NOTE — Telephone Encounter (Signed)
 Please call patient to return for redraw blood work  C3 complement and C4 complement order placed

## 2023-07-04 NOTE — Telephone Encounter (Signed)
 Patient will go to [facility] for lab draw and processing.

## 2023-07-06 ENCOUNTER — Telehealth: Admitting: Family Medicine

## 2023-07-06 DIAGNOSIS — M546 Pain in thoracic spine: Secondary | ICD-10-CM

## 2023-07-06 MED ORDER — PREDNISONE 20 MG PO TABS: 20.0000 mg | ORAL_TABLET | Freq: Two times a day (BID) | ORAL | 0 refills | Status: AC

## 2023-07-06 NOTE — Progress Notes (Signed)
 Virtual Visit Consent   Heidi Holland, you are scheduled for a virtual visit with a [facility] provider today. Just as with appointments in the office, your consent must be obtained to participate. Your consent will be active for this visit and any virtual visit you may have with one of our providers in the next 365 days. If you have a MyChart account, a copy of this consent can be sent to you electronically.  As this is a virtual visit, video technology does not allow for your provider to perform a traditional examination. This may limit your provider's ability to fully assess your condition. If your provider identifies any concerns that need to be evaluated in person or the need to arrange testing (such as labs, EKG, etc.), we will make arrangements to do so. Although advances in technology are sophisticated, we cannot ensure that it will always work on either your end or our end. If the connection with a video visit is poor, the visit may have to be switched to a telephone visit. With either a video or telephone visit, we are not always able to ensure that we have a secure connection.  By engaging in this virtual visit, you consent to the provision of healthcare and authorize for your insurance to be billed (if applicable) for the services provided during this visit. Depending on your insurance coverage, you may receive a charge related to this service.  I need to obtain your verbal consent now. Are you willing to proceed with your visit today? Heidi Holland has provided verbal consent on 07/06/2023 for a virtual visit (video or telephone). Heidi Huger, FNP  Date: 07/06/2023 9:51 AM   Virtual Visit via Video Note   I, Heidi Holland, connected with  Heidi Holland  ([id number], 09-Jun-1975) on 07/06/23 at  9:00 AM EDT by a video-enabled telemedicine application and verified that I am speaking with the correct person using two identifiers.  Location: Patient: Virtual Visit Location Patient: Home Provider:  Virtual Visit Location Provider: Home Office   I discussed the limitations of evaluation and management by telemedicine and the availability of in person appointments. The patient expressed understanding and agreed to proceed.    History of Present Illness: Heidi Holland is a 48 y.o. who identifies as a female who was assigned female at birth, and is being seen today for thoracic back pain, She is requesting prednisone . She has a muscle relaxant and has a MRI scheduled this week. Heidi Holland  HPI: HPI  Problems:  Patient Active Problem List   Diagnosis Date Noted   Fibromyalgia 06/27/2023   Ovarian cyst 02/20/2023   Essential hypertension 02/20/2023   Bipolar 1 disorder, depressed (HCC) 02/16/2022   Paresthesia 09/13/2021   CIN I (cervical intraepithelial neoplasia I) 03/03/2021   LSIL pap smear of cervix/human papillomavirus (HPV) positive 10/24/2020   Family history of early CAD 10/24/2020   B12 deficiency 07/26/2020   Somatic dysfunction of spine, cervical 07/21/2020   Status post left hip replacement 03/12/2019   T2DM (type 2 diabetes mellitus) (HCC) 07/31/2018   Dyslipidemia associated with type 2 diabetes mellitus (HCC) 07/31/2018   Allergic rhinitis 07/31/2018   Mild intermittent asthma, uncomplicated 07/31/2018   GERD (gastroesophageal reflux disease) 07/31/2018   S/P gastric bypass 07/31/2018   Migraine 07/31/2018   Anxiety 07/31/2018   Depression, major, single episode, complete remission (HCC) 07/31/2018   Insomnia 07/31/2018   Umbilical hernia 07/31/2018   Iron deficiency anemia 06/09/2018   Cervicogenic headache 05/12/2018   Low back  pain 05/12/2018   Polyarthralgia 05/12/2018    Allergies:  Allergies  Allergen Reactions   Ciprofloxacin Itching   Medications:  Current Outpatient Medications:    predniSONE  (DELTASONE ) 20 MG tablet, Take 1 tablet (20 mg total) by mouth 2 (two) times daily with a meal for 5 days., Disp: 10 tablet, Rfl: 0   AUVELITY 45-105 MG TBCR, Take by  mouth., Disp: , Rfl:    azelastine  (ASTELIN ) 0.1 % nasal spray, Place 2 sprays into both nostrils 2 (two) times daily., Disp: 30 mL, Rfl: 12   clonazePAM  (KLONOPIN ) 0.5 MG tablet, Take 1 tablet (0.5 mg total) by mouth 2 (two) times daily as needed. for anxiety, Disp: 60 tablet, Rfl: 0   Continuous Glucose Sensor (FREESTYLE LIBRE 3 PLUS SENSOR) MISC, Change sensor every 15 days., Disp: 2 each, Rfl: 3   desonide  (DESOWEN ) 0.05 % cream, Apply topically 2 (two) times daily., Disp: 30 g, Rfl: 5   HYDROcodone -acetaminophen  (NORCO) 10-325 MG tablet, Take 1 tablet by mouth every 6 (six) hours as needed., Disp: , Rfl:    losartan  (COZAAR ) 50 MG tablet, Take 1 tablet (50 mg total) by mouth daily., Disp: 90 tablet, Rfl: 1   lumateperone tosylate (CAPLYTA) 42 MG capsule, , Disp: , Rfl:    methocarbamol (ROBAXIN) 750 MG tablet, Take 1 tablet by mouth 3 (three) times daily as needed., Disp: , Rfl:    ondansetron  (ZOFRAN -ODT) 4 MG disintegrating tablet, TAKE 1 TABLET (4 MG TOTAL) BY MOUTH EVERY 6 (SIX) HOURS AS NEEDED FOR NAUSEA OR VOMITING, Disp: 18 tablet, Rfl: 11   pregabalin  (LYRICA ) 150 MG capsule, TAKE 1 CAPSULE BY MOUTH TWICE A DAY, Disp: 60 capsule, Rfl: 5   QUEtiapine (SEROQUEL) 200 MG tablet, Take 2 tablets by mouth at bedtime., Disp: , Rfl:    rosuvastatin  (CRESTOR ) 5 MG tablet, Take 1 tablet (5 mg total) by mouth daily., Disp: 30 tablet, Rfl: 3   SUMAtriptan  (IMITREX ) 25 MG tablet, TAKE 1 TABLET DAILY AS NEEDED FOR MIGRAINE, MAY REPEAT IN 2 HOURS IF HEADACHE PERSISTS OR RECURS., Disp: 9 tablet, Rfl: 2   tirzepatide  (MOUNJARO ) 10 MG/0.5ML Pen, Inject 10 mg into the skin once a week., Disp: 6 mL, Rfl: 3   tiZANidine  (ZANAFLEX ) 4 MG tablet, Take 1 tablet (4 mg total) by mouth 3 (three) times daily., Disp: 90 tablet, Rfl: 5   VENTOLIN  HFA 108 (90 Base) MCG/ACT inhaler, INHALE 1-2 PUFFS BY MOUTH EVERY 6 HOURS AS NEEDED FOR WHEEZE OR SHORTNESS OF BREATH, Disp: 18 each, Rfl: 2   zolpidem  (AMBIEN ) 5 MG tablet,  Take 1 tablet (5 mg total) by mouth at bedtime as needed for sleep., Disp: 30 tablet, Rfl: 5  Observations/Objective: Patient is well-developed, well-nourished in no acute distress.  Resting comfortably  at home.  Head is normocephalic, atraumatic.  No labored breathing.  Speech is clear and coherent with logical content.  Patient is alert and oriented at baseline.    Assessment and Plan: 1. Acute bilateral thoracic back pain (Primary)  Heat, med use and side effects per pharmacy, follow up as scheduled.   Follow Up Instructions: I discussed the assessment and treatment plan with the patient. The patient was provided an opportunity to ask questions and all were answered. The patient agreed with the plan and demonstrated an understanding of the instructions.  A copy of instructions were sent to the patient via MyChart unless otherwise noted below.    The patient was advised to call back or seek an in-person  evaluation if the symptoms worsen or if the condition fails to improve as anticipated.    Heidi Konecny, FNP

## 2023-07-06 NOTE — Patient Instructions (Signed)
Thoracic Strain A thoracic strain is an injury to the muscles or tendons that attach to the upper part of your back behind your chest. Tendons are tissues that connect muscle to bone. This injury is sometimes called a mid-back strain. It happens when a muscle is stretched too far or overloaded. Thoracic strains can range from mild to severe. Mild strains may involve stretching a muscle or tendon without tearing it. These injuries may heal in 1-2 weeks. More severe strains involve tearing of muscle fibers or tendons. These will cause more pain and may take 6-8 weeks to heal. What are the causes? This condition may be caused by: Trauma, such as a fall or a hit to the body. Twisting or overstretching the back. This may result from doing activities that take a lot of energy, such as lifting heavy objects. In some cases, the cause may not be known. What increases the risk? This injury is more common in: Athletes. People with obesity. What are the signs or symptoms? The main symptom of this condition is pain in the middle back, especially with movement. Other symptoms include: Stiffness or limited range of motion. Sudden muscle tightening (spasms). How is this diagnosed? This condition may be diagnosed based on: Your symptoms. Your medical history. A physical exam. Imaging tests, such as X-rays, a CT scan, or an MRI. How is this treated? This condition may be treated with: Resting the injured area. Applying heat and cold to the injured area. Over-the-counter medicines for pain and inflammation, such as NSAIDs. Prescription pain medicine or muscle relaxants. These may be needed for a short time. Doing exercises to improve movement and strength in your back (physical therapy). Treatments applied to the painful area, such as: Electrical stimulation. Stimulating the muscle with small needles (dry needling). Injections of medicine (trigger point injections). Follow these instructions at  home: Managing pain, stiffness, and swelling     If told, put ice on the injured area. Put ice in a plastic bag. Place a towel between your skin and the bag. Leave the ice on for 20 minutes, 2-3 times a day. If told, apply heat to the affected area as often as told by your health care provider. Use the heat source that your provider recommends, such as a moist heat pack or a heating pad. Place a towel between your skin and the heat source. Leave the heat on for 20-30 minutes. If your skin turns bright red, remove the ice or heat right away to prevent skin damage. The risk of damage is higher if you cannot feel pain, heat, or cold. Activity Rest and return to your normal activities as told by your provider. Ask your provider what activities are safe for you. Do exercises as told by your provider. Medicines Take over-the-counter and prescription medicines only as told by your provider. Ask your provider if the medicine prescribed to you: Requires you to avoid driving or using machinery. Can cause constipation. You may need to take these actions to prevent or treat constipation: Drink enough fluid to keep your pee (urine) pale yellow. Take over-the-counter or prescription medicines. Eat foods that are high in fiber, such as beans, whole grains, and fresh fruits and vegetables. Limit foods that are high in fat and processed sugars, such as fried or sweet foods. General instructions Do not use any products that contain nicotine or tobacco. These products include cigarettes, chewing tobacco, and vaping devices, such as e-cigarettes. If you need help quitting, ask your provider. Keep all follow-up  visits. Your provider will monitor your injury and activity. How is this prevented? To prevent a future mid-back injury: Always warm up before physical activity or sports. Cool down and stretch after being active. Use correct form when playing sports and lifting heavy objects. Bend your knees  before you lift heavy objects. Use good posture when sitting and standing. Stay physically fit and maintain a healthy weight. Do at least 150 minutes of moderate-intensity exercise each week, such as brisk walking or water aerobics. Do strength exercises at least 2 times each week. Contact a health care provider if: Your pain is not helped by medicine. Your pain or stiffness is getting worse. You develop pain or stiffness in your neck or lower back. Get help right away if: You have shortness of breath. You have chest pain. You have numbness, tingling, or weakness in your legs. You are not able to control when you pee (urinary incontinence). These symptoms may be an emergency. Get help right away. Call 911. Do not wait to see if the symptoms will go away. Do not drive yourself to the hospital. This information is not intended to replace advice given to you by your health care provider. Make sure you discuss any questions you have with your health care provider. Document Revised: 06/04/2022 Document Reviewed: 09/18/2021 Elsevier Patient Education  2024 [employer].

## 2023-07-07 ENCOUNTER — Other Ambulatory Visit: Payer: Self-pay | Admitting: Family Medicine

## 2023-07-07 ENCOUNTER — Encounter: Payer: Self-pay | Admitting: Family Medicine

## 2023-07-09 ENCOUNTER — Other Ambulatory Visit (HOSPITAL_COMMUNITY): Payer: Self-pay

## 2023-07-09 ENCOUNTER — Telehealth: Payer: Self-pay

## 2023-07-09 NOTE — Telephone Encounter (Signed)
 Pharmacy Patient Advocate Encounter   Received notification from [employer] that prior authorization for Freestyle libre 3 plus sensor is required/requested.   Insurance verification completed.   The patient is insured through [facility] .   Per test claim: PA required; PA submitted to above mentioned insurance via CoverMyMeds Key/confirmation #/EOC [facility] Status is pending

## 2023-07-09 NOTE — Telephone Encounter (Signed)
 Pharmacy Patient Advocate Encounter  Received notification from [employer] that Prior Authorization for [facility] 3 plus sensor  has been DENIED.  Full denial letter will be uploaded to the media tab. See denial reason below.    PA #/Case ID/Reference #: [id number]

## 2023-07-10 ENCOUNTER — Other Ambulatory Visit: Payer: Self-pay | Admitting: *Deleted

## 2023-07-10 LAB — COMPLEMENT COMPONENT C1Q: Complement C1Q: 5.3 mg/dL (ref 5.0–8.6)

## 2023-07-10 LAB — C3 COMPLEMENT

## 2023-07-10 LAB — IRON,TIBC AND FERRITIN PANEL
%SAT: 4 % — ABNORMAL LOW (ref 16–45)
Ferritin: 4 ng/mL — ABNORMAL LOW (ref 16–232)
Iron: 18 ug/dL — ABNORMAL LOW (ref 40–190)
TIBC: 494 ug/dL — ABNORMAL HIGH (ref 250–450)

## 2023-07-10 LAB — CYCLIC CITRUL PEPTIDE ANTIBODY, IGG: Cyclic Citrullin Peptide Ab: <16 U

## 2023-07-10 LAB — RHEUMATOID FACTOR: Rheumatoid fact SerPl-aCnc: <10 [IU]/mL (ref <14)

## 2023-07-10 LAB — HLA-B27 ANTIGEN: HLA-B27 Antigen: NEGATIVE

## 2023-07-10 LAB — VITAMIN B1: Vitamin B1 (Thiamine): 8 nmol/L (ref 8–30)

## 2023-07-10 MED ORDER — PANTOPRAZOLE SODIUM 40 MG PO TBEC: 40.0000 mg | DELAYED_RELEASE_TABLET | Freq: Every day | ORAL | 1 refills | Status: DC

## 2023-07-10 NOTE — Telephone Encounter (Signed)
 Left message to return call to our office at their convenience.

## 2023-07-11 ENCOUNTER — Ambulatory Visit: Admitting: Obstetrics and Gynecology

## 2023-07-11 NOTE — Progress Notes (Signed)
 Her autoimmune labs are all negative.  Her thyroid  is on the lower range of normal.  She can try taking iron supplementation 100 mg daily.  We should recheck in 3 to 6 months.

## 2023-07-11 NOTE — Telephone Encounter (Signed)
 Patient aware Freestyle Heidi Holland been denied

## 2023-07-13 ENCOUNTER — Other Ambulatory Visit: Payer: Self-pay | Admitting: Cardiology

## 2023-07-13 DIAGNOSIS — E119 Type 2 diabetes mellitus without complications: Secondary | ICD-10-CM

## 2023-07-13 DIAGNOSIS — Z8249 Family history of ischemic heart disease and other diseases of the circulatory system: Secondary | ICD-10-CM

## 2023-07-15 ENCOUNTER — Ambulatory Visit: Admitting: Family Medicine

## 2023-07-15 ENCOUNTER — Other Ambulatory Visit: Payer: Self-pay

## 2023-07-15 ENCOUNTER — Ambulatory Visit (INDEPENDENT_AMBULATORY_CARE_PROVIDER_SITE_OTHER)

## 2023-07-15 VITALS — BP 128/86 | HR 81 | Ht 62.0 in | Wt 183.0 lb

## 2023-07-15 DIAGNOSIS — G8929 Other chronic pain: Secondary | ICD-10-CM | POA: Diagnosis not present

## 2023-07-15 DIAGNOSIS — G5712 Meralgia paresthetica, left lower limb: Secondary | ICD-10-CM | POA: Diagnosis not present

## 2023-07-15 DIAGNOSIS — M797 Fibromyalgia: Secondary | ICD-10-CM | POA: Diagnosis not present

## 2023-07-15 DIAGNOSIS — M2550 Pain in unspecified joint: Secondary | ICD-10-CM

## 2023-07-15 DIAGNOSIS — M25562 Pain in left knee: Secondary | ICD-10-CM

## 2023-07-15 DIAGNOSIS — R531 Weakness: Secondary | ICD-10-CM | POA: Diagnosis not present

## 2023-07-15 DIAGNOSIS — M25561 Pain in right knee: Secondary | ICD-10-CM

## 2023-07-15 NOTE — Progress Notes (Addendum)
 LILLETTE Ileana Collet, PhD, LAT, ATC acting as a scribe for Artist Lloyd, MD.  Heidi Holland is a 48 y.o. female who presents to [facility] at [facility] today for polyarthralgia. L hip replacement in 2019. PCP and rheum dx her w/ fibromyalgia. Now bilat knees hurt, L>R. Hx of arthroscopy in both knees. She notes chronic pain for the past year and a half. Has had to decrease work to half days. She notes over weakness and lack of strength. Pt also c/o L lateral thigh pain w/ radiating pain distally to her L foot.     Dx testing: 06/27/23 Labs  Pertinent review of systems: No fevers or chills  Relevant historical information: Hypertension and diabetes   Exam:  BP 128/86   Pulse 81   Ht 5' 2 (1.575 m)   Wt 183 lb (83 kg)   LMP 06/10/2023 (Approximate)   SpO2 99%   BMI 33.47 kg/m  General: Well Developed, well nourished, and in no acute distress.   MSK: C-spine normal appearing Nontender palpation spinal midline. Decreased cervical motion. Upper extremity strength is intact.  T-spine tender palpation paraspinal musculature.  Nontender palpation midline.  Left hip normal-appearing Pressure at ASIS reproduces left lateral thigh paresthesia.  Knees bilaterally normal-appearing normal motion with crepitation.  Lab and Radiology Results  Left femoral cutaneous nerve hydrodissection Procedure: Real-time Ultrasound Guided left lateral femoral cutaneous nerve hydrodissection and carpal tunnel injection  Device: Philips Affiniti 50G/GE Logiq Images permanently stored and available for review in PACS Verbal informed consent obtained.  Discussed risks and benefits of procedure. Warned about infection, bleeding, hyperglycemia damage to structures among others. Patient expresses understanding and agreement Time-out conducted.   Noted no overlying erythema, induration, or other signs of local infection.   Skin prepped in a sterile fashion.   Local anesthesia: Topical  Ethyl chloride.   With sterile technique and under real time ultrasound guidance: 40 mg of Kenalog  and 1 mL of lidocaine  injected into soft tissue around the left lateral from medial cutaneous nerve. Fluid seen entering the soft tissue around the femoral cutaneous nerve    Completed without difficulty   Pain immediately resolved suggesting accurate placement of the medication.   Advised to call if fevers/chills, erythema, induration, drainage, or persistent bleeding.   Images permanently stored and available for review in the ultrasound unit.  Impression: Technically successful ultrasound guided injection.    X-ray images bilateral knees obtained today personally and independently interpreted.  Right knee: Minimal medial DJD  Left knee: Normal medial DJD.  Await formal radiology review   Assessment and Plan: 48 y.o. female with multifactorial pain.  Patient has a lot of muscle dysfunction and fibromyalgia related pain in her upper back and rhomboid.  She also has bilateral knee pain prior dominantly from patellofemoral chondromalacia and patellofemoral pain syndrome.  She is a great candidate for physical therapy for this.  Plan to refer to PT.  She had excellent workup with rheumatology labs recently that were unremarkable.  I am not sure that there is much to offer from a rheumatologist.  Additionally she has left meralgia paresthetica.  She had a great immediate response to injection today at the left lateral femoral cutaneous nerve.  Hopefully this will provide some lasting benefit.  Additionally she has fibromyalgia.  She is on a reasonable medical regimen of Lyrica .  I think this is an appropriate medication regimen.  I am hopeful that she will be able to be a little more active  with physical therapy but should help with fibromyalgia.  Recommended a self-guided cognitive behavioral therapy workbook.  Recheck in 1 month.   PDMP not reviewed this encounter. Orders Placed This  Encounter  Procedures   US  LIMITED JOINT SPACE STRUCTURES LOW LEFT(NO LINKED CHARGES)    Reason for Exam (SYMPTOM  OR DIAGNOSIS REQUIRED):   left hip pain    Preferred imaging location?:   [facility] Knee AP/LAT W/Sunrise Left    Standing Status:   Future    Number of Occurrences:   1    Expiration Date:   08/14/2023    Reason for Exam (SYMPTOM  OR DIAGNOSIS REQUIRED):   bilateral knee pain    Preferred imaging location?:   [place]    Is patient pregnant?:   No   DG Knee AP/LAT W/Sunrise Right    Standing Status:   Future    Number of Occurrences:   1    Expiration Date:   08/14/2023    Reason for Exam (SYMPTOM  OR DIAGNOSIS REQUIRED):   bilateral knee pain    Preferred imaging location?:   [place]    Is patient pregnant?:   No   Ambulatory referral to Physical Therapy    Referral Priority:   Routine    Referral Type:   Physical Medicine    Referral Reason:   Specialty Services Required    Requested Specialty:   Physical Therapy    Number of Visits Requested:   1   No orders of the defined types were placed in this encounter.    Discussed warning signs or symptoms. Please see discharge instructions. Patient expresses understanding.   The above documentation has been reviewed and is accurate and complete Artist Lloyd, M.D.

## 2023-07-15 NOTE — Patient Instructions (Addendum)
 Thank you for coming in today.   I've referred you to Physical Therapy.  Let us  know if you don't hear from them in one week.   You received an injection today. Seek immediate medical attention if the joint becomes red, extremely painful, or is oozing fluid.   Please get an Xray today before you leave   Check back in 1 month

## 2023-07-17 ENCOUNTER — Telehealth: Payer: Self-pay | Admitting: Family Medicine

## 2023-07-17 NOTE — Telephone Encounter (Signed)
 Copied from CRM [id number]. Topic: Referral - Status >> Jul 17, 2023  8:27 AM Clyde Darling P wrote: Reason for CRM: [facility] advise she cannot be seen due to insurance

## 2023-07-18 NOTE — Telephone Encounter (Signed)
 Please re-route referral to a different office.  Heidi Holland. Daneil Dunker, MD 07/18/2023 9:12 AM

## 2023-07-18 NOTE — Telephone Encounter (Signed)
 See note

## 2023-07-18 NOTE — Telephone Encounter (Signed)
 Left message to return call to our office at their convenience.

## 2023-07-22 ENCOUNTER — Inpatient Hospital Stay: Admitting: Oncology

## 2023-07-22 ENCOUNTER — Ambulatory Visit: Payer: Self-pay | Admitting: Family Medicine

## 2023-07-22 ENCOUNTER — Encounter: Payer: Self-pay | Admitting: Family Medicine

## 2023-07-22 ENCOUNTER — Encounter: Payer: Self-pay | Admitting: Oncology

## 2023-07-22 ENCOUNTER — Inpatient Hospital Stay: Attending: Oncology

## 2023-07-22 VITALS — BP 122/71 | HR 80 | Temp 97.8°F | Resp 18 | Ht 62.0 in | Wt 186.6 lb

## 2023-07-22 DIAGNOSIS — Z9884 Bariatric surgery status: Secondary | ICD-10-CM | POA: Insufficient documentation

## 2023-07-22 DIAGNOSIS — D509 Iron deficiency anemia, unspecified: Secondary | ICD-10-CM | POA: Diagnosis not present

## 2023-07-22 LAB — CBC WITH DIFFERENTIAL (CANCER CENTER ONLY)
Abs Immature Granulocytes: 0.04 10*3/uL (ref 0.00–0.07)
Basophils Absolute: 0 10*3/uL (ref 0.0–0.1)
Basophils Relative: 0 %
Eosinophils Absolute: 0.1 10*3/uL (ref 0.0–0.5)
Eosinophils Relative: 1 %
HCT: 32.2 % — ABNORMAL LOW (ref 36.0–46.0)
Hemoglobin: 9.9 g/dL — ABNORMAL LOW (ref 12.0–15.0)
Immature Granulocytes: 0 %
Lymphocytes Relative: 22 %
Lymphs Abs: 2.5 10*3/uL (ref 0.7–4.0)
MCH: 22.8 pg — ABNORMAL LOW (ref 26.0–34.0)
MCHC: 30.7 g/dL (ref 30.0–36.0)
MCV: 74.2 fL — ABNORMAL LOW (ref 80.0–100.0)
Monocytes Absolute: 0.8 10*3/uL (ref 0.1–1.0)
Monocytes Relative: 7 %
Neutro Abs: 7.9 10*3/uL — ABNORMAL HIGH (ref 1.7–7.7)
Neutrophils Relative %: 70 %
Platelet Count: 278 10*3/uL (ref 150–400)
RBC: 4.34 MIL/uL (ref 3.87–5.11)
RDW: 15.9 % — ABNORMAL HIGH (ref 11.5–15.5)
WBC Count: 11.3 10*3/uL — ABNORMAL HIGH (ref 4.0–10.5)
nRBC: 0 % (ref 0.0–0.2)

## 2023-07-22 LAB — IRON AND TIBC
Iron: 27 ug/dL — ABNORMAL LOW (ref 28–170)
Saturation Ratios: 6 % — ABNORMAL LOW (ref 10.4–31.8)
TIBC: 482 ug/dL — ABNORMAL HIGH (ref 250–450)
UIBC: 455 ug/dL

## 2023-07-22 LAB — CMP (CANCER CENTER ONLY)
ALT: 22 U/L (ref 0–44)
AST: 23 U/L (ref 15–41)
Albumin: 4.2 g/dL (ref 3.5–5.0)
Alkaline Phosphatase: 61 U/L (ref 38–126)
Anion gap: 13 (ref 5–15)
BUN: 12 mg/dL (ref 6–20)
CO2: 21 mmol/L — ABNORMAL LOW (ref 22–32)
Calcium: 9.4 mg/dL (ref 8.9–10.3)
Chloride: 105 mmol/L (ref 98–111)
Creatinine: 0.71 mg/dL (ref 0.44–1.00)
GFR, Estimated: >60 mL/min (ref >60)
Glucose, Bld: 116 mg/dL — ABNORMAL HIGH (ref 70–99)
Potassium: 4 mmol/L (ref 3.5–5.1)
Sodium: 138 mmol/L (ref 135–145)
Total Bilirubin: 0.2 mg/dL (ref 0.0–1.2)
Total Protein: 6.8 g/dL (ref 6.5–8.1)

## 2023-07-22 LAB — FERRITIN: Ferritin: 6 ng/mL — ABNORMAL LOW (ref 11–307)

## 2023-07-22 NOTE — Progress Notes (Signed)
Right knee x-ray looks normal to radiology

## 2023-07-22 NOTE — Assessment & Plan Note (Addendum)
 Chronic iron deficiency anemia likely secondary to malabsorption post-gastric bypass surgery in 2016.   Reports significant fatigue, cognitive impairment, and musculoskeletal pain. No evidence of gastrointestinal bleeding or other sources of blood loss. Previous iron infusion two years ago. Current iron levels are low despite normal B12 and folic acid  levels.  Oral iron supplements not well tolerated and absorption is questionable. No history of blood transfusion, chest pain, dyspnea, or pica.  Labs today show persistent anemia with hemoglobin of 9.9, MCV 74.2.  Ferritin decreased at 6.  Remaining iron studies are pending.  Given persistent iron deficiency, plan is to proceed with IV iron using Feraheme  or Venofer, pending insurance authorization.  Our clinic will arrange IV iron infusion appointments, once authorized.  - Schedule follow-up appointment in four months to reassess iron levels and symptoms.  - If iron levels do not improve with IV iron, consider further testing to identify other causes of anemia.

## 2023-07-22 NOTE — Progress Notes (Signed)
 [facility]  HEMATOLOGY CLINIC CONSULTATION NOTE   PATIENT NAME: Heidi Holland   MR#: [id number] DOB: Aug 01, 1975  DATE OF SERVICE: 07/22/2023  Patient Care Team: Rodney Clamp, MD as PCP - General (Family Medicine) Olinda Bertrand, DO as PCP - Cardiology (Cardiology) Merriam Abbey, DO as Consulting Physician (Neurology)  REASON FOR CONSULTATION/ CHIEF COMPLAINT:  Evaluation of anemia.  ASSESSMENT & PLAN:   Heidi Holland is a 48 y.o. lady with a past medical history of Gastric bypass in 2016, GERD, migraines, bipolar disorder, diabetes mellitus, hypertension, PCOS, diverticulosis, was referred to our service for evaluation of iron deficiency anemia.    Iron deficiency anemia Chronic iron deficiency anemia likely secondary to malabsorption post-gastric bypass surgery in 2016.   Reports significant fatigue, cognitive impairment, and musculoskeletal pain. No evidence of gastrointestinal bleeding or other sources of blood loss. Previous iron infusion two years ago. Current iron levels are low despite normal B12 and folic acid  levels.  Oral iron supplements not well tolerated and absorption is questionable. No history of blood transfusion, chest pain, dyspnea, or pica.  Labs today show persistent anemia with hemoglobin of 9.9, MCV 74.2.  Ferritin decreased at 6.  Remaining iron studies are pending.  Given persistent iron deficiency, plan is to proceed with IV iron using Feraheme  or Venofer, pending insurance authorization.  Our clinic will arrange IV iron infusion appointments, once authorized.  - Schedule follow-up appointment in four months to reassess iron levels and symptoms.  - If iron levels do not improve with IV iron, consider further testing to identify other causes of anemia.   Since the cause of anemia seems to be obvious from iron deficiency, I am not pursuing extensive workup at this time.  If inadequate response to iron replacement is noted, we will pursue  workup to rule out other etiologies.   I reviewed lab results and outside records for this visit and discussed relevant results with the patient. Diagnosis, plan of care and treatment options were also discussed in detail with the patient. Opportunity provided to ask questions and answers provided to her apparent satisfaction. Provided instructions to call our clinic with any problems, questions or concerns prior to return visit. I recommended to continue follow-up with PCP and sub-specialists. She verbalized understanding and agreed with the plan. No barriers to learning was detected.  Tracie Lindbloom, MD [facility] A DEPT OF [facility] [address] [place] [address] Dept: [phone] Dept Fax: [phone]  07/22/2023 4:34 PM  HISTORY OF PRESENT ILLNESS:  Discussed the use of AI scribe software for clinical note transcription with the patient, who gave verbal consent to proceed.  History of Present Illness Heidi Holland is a 48 year old female with anemia post-gastric bypass who presents for evaluation of persistent anemia. She is accompanied by her sister, Heidi Holland. She was referred by Dr. Daneil Dunker for evaluation of her anemia.  She has experienced anemia since her gastric bypass surgery in 2016. On 06/27/2023, labs at her PCPs office showed hemoglobin of 11.3, hematocrit 35.1, MCV 70.9.  White count and platelet count were within normal limits.  Iron studies showed evidence of severe iron deficiency with ferritin of 4, iron saturation of 4%, iron decreased at 18, iron binding capacity increased at 494.  CMP unremarkable.  Vitamin B12, folate, CRP, TSH were all within normal limits.  She was referred to us  for further evaluation and management of iron deficiency  anemia.  Last colonoscopy in April 2024 showed evidence of diverticulosis and 1 benign polyp.  EGD at the same time showed LA grade A esophagitis.  No other  major abnormalities.  She received an iron infusion approximately two years ago and another one recently. Despite these treatments, she continues to experience significant fatigue, brain fog, and persistent joint and muscle pain.  She has a history of intolerance to oral iron supplements. Recent blood work indicated low iron levels, although her B12 and folic acid  levels were normal a month ago.  No obvious sources of blood loss, such as gastrointestinal bleeding, epistaxis, or gum bleeding. Her menstrual cycles are not heavy, and she has not experienced any unusual cravings for ice or non-food substances.  She has not had any blood transfusions. No chest pain or trouble breathing.    MEDICAL HISTORY:  Past Medical History:  Diagnosis Date   Abnormal Pap smear of cervix    Allergy    Anxiety    Asthma    Bipolar 1 disorder (HCC)    Chronic pain    Depression    Diabetes mellitus without complication (HCC)    Diverticulitis    GERD (gastroesophageal reflux disease)    HPV in female    Hyperlipidemia    Hypertension    Migraines    Osteoarthritis    Ovarian cyst    right   PCOS (polycystic ovarian syndrome)    STD (sexually transmitted disease)    Trichomonas contact, treated    Vertigo     SURGICAL HISTORY: Past Surgical History:  Procedure Laterality Date   BOWEL RESECTION     gasticbypass     GASTRIC BYPASS     KNEE ARTHROSCOPY Bilateral    SHOULDER SURGERY Left    TOTAL HIP ARTHROPLASTY Left     SOCIAL HISTORY: She reports that she has never smoked. She has never used smokeless tobacco. She reports that she does not currently use alcohol. She reports current drug use. Drug: Marijuana. Social History   Socioeconomic History   Marital status: Single    Spouse name: Not on file   Number of children: 0   Years of education: 14   Highest education level: Associate degree: academic program  Occupational History    Employer: [employer]  Tobacco Use   Smoking status:  Never   Smokeless tobacco: Never  Vaping Use   Vaping status: Never Used  Substance and Sexual Activity   Alcohol use: Not Currently   Drug use: Yes    Types: Marijuana    Comment: occ   Sexual activity: Not Currently    Partners: Male    Birth control/protection: Abstinence  Other Topics Concern   Not on file  Social History Narrative   Right handed.    Social Drivers of Health   Financial Resource Strain: High Risk (06/27/2023)   Overall Financial Resource Strain (CARDIA)    Difficulty of Paying Living Expenses: Very hard  Food Insecurity: Food Insecurity Present (06/27/2023)   Hunger Vital Sign    Worried About Running Out of Food in the Last Year: Often true    Ran Out of Food in the Last Year: Sometimes true  Transportation Needs: Unmet Transportation Needs (06/27/2023)   PRAPARE - Transportation    Lack of Transportation (Medical): No    Lack of Transportation (Non-Medical): Yes  Physical Activity: Unknown (06/27/2023)   Exercise Vital Sign    Days of Exercise per Week: 0 days    Minutes of  Exercise per Session: Not on file  Stress: Stress Concern Present (06/27/2023)   [employer] of Occupational Health - Occupational Stress Questionnaire    Feeling of Stress : Rather much  Social Connections: Socially Isolated (06/27/2023)   Social Connection and Isolation Panel [NHANES]    Frequency of Communication with Friends and Family: Three times a week    Frequency of Social Gatherings with Friends and Family: Once a week    Attends Religious Services: Never    Database administrator or Organizations: No    Attends Engineer, structural: Not on file    Marital Status: Never married  Intimate Partner Violence: Unknown (10/23/2021)   Received from [employer], [facility]   HITS    Physically Hurt: Not on file    Insult or Talk Down To: Not on file    Threaten Physical Harm: Not on file    Scream or Curse: Not on file    FAMILY HISTORY: Family History   Problem Relation Age of Onset   Stroke Mother    Heart disease Mother    Lung cancer Mother    Rheum arthritis Mother    Arthritis Mother    COPD Mother    Fibromyalgia Mother    Hypertension Father    Diabetes Father    Heart disease Father    Hodgkin's lymphoma Father    Endometriosis Sister    Drug abuse Brother        died of overdose   Breast cancer Paternal Aunt    Diabetes Maternal Grandmother    Lung cancer Maternal Grandmother    Other Maternal Grandmother        pace maker   Skin cancer Paternal Grandmother     ALLERGIES:  She is allergic to ciprofloxacin.  MEDICATIONS:  Current Outpatient Medications  Medication Sig Dispense Refill   AUVELITY 45-105 MG TBCR Take by mouth.     azelastine  (ASTELIN ) 0.1 % nasal spray Place 2 sprays into both nostrils 2 (two) times daily. 30 mL 12   clonazePAM  (KLONOPIN ) 0.5 MG tablet Take 1 tablet (0.5 mg total) by mouth 2 (two) times daily as needed. for anxiety 60 tablet 0   Continuous Glucose Sensor (FREESTYLE LIBRE 3 PLUS SENSOR) MISC Change sensor every 15 days. 2 each 3   desonide  (DESOWEN ) 0.05 % cream Apply topically 2 (two) times daily. 30 g 5   HYDROcodone -acetaminophen  (NORCO) 10-325 MG tablet Take 1 tablet by mouth every 6 (six) hours as needed.     losartan  (COZAAR ) 50 MG tablet Take 1 tablet (50 mg total) by mouth daily. 90 tablet 1   lumateperone tosylate (CAPLYTA) 42 MG capsule      methocarbamol (ROBAXIN) 750 MG tablet Take 1 tablet by mouth 3 (three) times daily as needed.     ondansetron  (ZOFRAN -ODT) 4 MG disintegrating tablet TAKE 1 TABLET (4 MG TOTAL) BY MOUTH EVERY 6 (SIX) HOURS AS NEEDED FOR NAUSEA OR VOMITING 18 tablet 11   pantoprazole  (PROTONIX ) 40 MG tablet Take 1 tablet (40 mg total) by mouth daily. 90 tablet 1   pregabalin  (LYRICA ) 150 MG capsule TAKE 1 CAPSULE BY MOUTH TWICE A DAY 60 capsule 5   QUEtiapine (SEROQUEL) 200 MG tablet Take 2 tablets by mouth at bedtime.     rosuvastatin  (CRESTOR ) 5 MG  tablet TAKE 1 TABLET (5 MG TOTAL) BY MOUTH DAILY. 90 tablet 3   SUMAtriptan  (IMITREX ) 25 MG tablet TAKE 1 TABLET DAILY AS NEEDED FOR MIGRAINE, MAY  REPEAT IN 2 HOURS IF HEADACHE PERSISTS OR RECURS. 9 tablet 2   tirzepatide  (MOUNJARO ) 10 MG/0.5ML Pen Inject 10 mg into the skin once a week. 6 mL 3   tiZANidine  (ZANAFLEX ) 4 MG tablet Take 1 tablet (4 mg total) by mouth 3 (three) times daily. 90 tablet 5   VENTOLIN  HFA 108 (90 Base) MCG/ACT inhaler INHALE 1-2 PUFFS BY MOUTH EVERY 6 HOURS AS NEEDED FOR WHEEZE OR SHORTNESS OF BREATH 18 each 2   zolpidem  (AMBIEN ) 5 MG tablet Take 1 tablet (5 mg total) by mouth at bedtime as needed for sleep. 30 tablet 5   No current facility-administered medications for this visit.    REVIEW OF SYSTEMS:    Review of Systems - Oncology  All other pertinent systems were reviewed and were negative except as mentioned above.  PHYSICAL EXAMINATION:    Onc Performance Status - 07/22/23 1421       ECOG Perf Status   ECOG Perf Status Fully active, able to carry on all pre-disease performance without restriction      KPS SCALE   KPS % SCORE Normal, no compliants, no evidence of disease             Vitals:   07/22/23 1420  BP: 122/71  Pulse: 80  Resp: 18  Temp: 97.8 F (36.6 C)  SpO2: 100%   Filed Weights   07/22/23 1420  Weight: 186 lb 9.6 oz (84.6 kg)    Physical Exam Constitutional:      General: She is not in acute distress.    Appearance: Normal appearance.  HENT:     Head: Normocephalic and atraumatic.  Cardiovascular:     Rate and Rhythm: Normal rate.     Heart sounds: Normal heart sounds.  Pulmonary:     Effort: Pulmonary effort is normal. No respiratory distress.     Breath sounds: Normal breath sounds.  Abdominal:     General: There is no distension.  Neurological:     General: No focal deficit present.     Mental Status: She is alert and oriented to person, place, and time.  Psychiatric:        Mood and Affect: Mood normal.         Behavior: Behavior normal.      LABORATORY DATA:   I have reviewed the data as listed.  Results for orders placed or performed in visit on 07/22/23  Ferritin  Result Value Ref Range   Ferritin 6 (L) 11 - 307 ng/mL  CMP (Cancer Center only)  Result Value Ref Range   Sodium 138 135 - 145 mmol/L   Potassium 4.0 3.5 - 5.1 mmol/L   Chloride 105 98 - 111 mmol/L   CO2 21 (L) 22 - 32 mmol/L   Glucose, Bld 116 (H) 70 - 99 mg/dL   BUN 12 6 - 20 mg/dL   Creatinine 4.09 8.11 - 1.00 mg/dL   Calcium  9.4 8.9 - 10.3 mg/dL   Total Protein 6.8 6.5 - 8.1 g/dL   Albumin 4.2 3.5 - 5.0 g/dL   AST 23 15 - 41 U/L   ALT 22 0 - 44 U/L   Alkaline Phosphatase 61 38 - 126 U/L   Total Bilirubin 0.2 0.0 - 1.2 mg/dL   GFR, Estimated >91 >47 mL/min   Anion gap 13 5 - 15  CBC with Differential (Cancer Center Only)  Result Value Ref Range   WBC Count 11.3 (H) 4.0 - 10.5 K/uL   RBC  4.34 3.87 - 5.11 MIL/uL   Hemoglobin 9.9 (L) 12.0 - 15.0 g/dL   HCT 11.9 (L) 14.7 - 82.9 %   MCV 74.2 (L) 80.0 - 100.0 fL   MCH 22.8 (L) 26.0 - 34.0 pg   MCHC 30.7 30.0 - 36.0 g/dL   RDW 56.2 (H) 13.0 - 86.5 %   Platelet Count 278 150 - 400 K/uL   nRBC 0.0 0.0 - 0.2 %   Neutrophils Relative % 70 %   Neutro Abs 7.9 (H) 1.7 - 7.7 K/uL   Lymphocytes Relative 22 %   Lymphs Abs 2.5 0.7 - 4.0 K/uL   Monocytes Relative 7 %   Monocytes Absolute 0.8 0.1 - 1.0 K/uL   Eosinophils Relative 1 %   Eosinophils Absolute 0.1 0.0 - 0.5 K/uL   Basophils Relative 0 %   Basophils Absolute 0.0 0.0 - 0.1 K/uL   Immature Granulocytes 0 %   Abs Immature Granulocytes 0.04 0.00 - 0.07 K/uL     RADIOGRAPHIC STUDIES:  I have personally reviewed the radiological images as listed and agree with the findings in the report.  DG Knee AP/LAT W/Sunrise Left Result Date: 07/21/2023 CLINICAL DATA:  Bilateral chronic knee pain EXAM: LEFT KNEE 3 VIEWS; RIGHT KNEE 3 VIEWS COMPARISON:  None Available. FINDINGS: No acute fracture or dislocation in  the bilateral knees. No knee joint effusion. No significant arthropathy. Soft tissues are radiographically unremarkable. IMPRESSION: Unremarkable bilateral knee radiographs. Electronically Signed   By: Rozell Cornet M.D.   On: 07/21/2023 02:20   DG Knee AP/LAT W/Sunrise Right Result Date: 07/21/2023 CLINICAL DATA:  Bilateral chronic knee pain EXAM: LEFT KNEE 3 VIEWS; RIGHT KNEE 3 VIEWS COMPARISON:  None Available. FINDINGS: No acute fracture or dislocation in the bilateral knees. No knee joint effusion. No significant arthropathy. Soft tissues are radiographically unremarkable. IMPRESSION: Unremarkable bilateral knee radiographs. Electronically Signed   By: Rozell Cornet M.D.   On: 07/21/2023 02:20    Orders Placed This Encounter  Procedures   CBC with Differential (Cancer Center Only)    Standing Status:   Future    Number of Occurrences:   1    Expiration Date:   07/21/2024   CMP (Cancer Center only)    Standing Status:   Future    Number of Occurrences:   1    Expiration Date:   07/21/2024   Iron and TIBC    Standing Status:   Future    Number of Occurrences:   1    Expiration Date:   07/21/2024   Ferritin    Standing Status:   Future    Number of Occurrences:   1    Expiration Date:   07/21/2024    Future Appointments  Date Time Provider Department Center  08/19/2023 10:00 AM Syliva Even, MD [facility] None  09/13/2023  9:30 AM Antonio Baumgarten, NP [facility]  12/30/2023  9:50 AM Merriam Abbey, DO [facility] None     I spent a total of 40 minutes during this encounter with the patient including review of chart and various tests results, discussions about plan of care and coordination of care plan.  This document was completed utilizing speech recognition software. Grammatical errors, random word insertions, pronoun errors, and incomplete sentences are an occasional consequence of this system due to software limitations, ambient noise, and hardware issues. Any formal questions or  concerns about the content, text or information contained within the body of this dictation should be directly  addressed to the provider for clarification.

## 2023-07-22 NOTE — Progress Notes (Signed)
 Left knee x-ray looks normal to radiology

## 2023-07-22 NOTE — Telephone Encounter (Signed)
 Information send to [facility], referral coordinator

## 2023-07-23 ENCOUNTER — Other Ambulatory Visit: Payer: Self-pay | Admitting: Oncology

## 2023-07-25 ENCOUNTER — Telehealth: Payer: Self-pay | Admitting: *Deleted

## 2023-07-25 NOTE — Telephone Encounter (Signed)
 Copied from CRM [id number]. Topic: Referral - Question >> Jul 22, 2023 11:22 AM Albertha Alosa wrote: Reason for CRM: Patient called in regarding referral to the eye doctor , stated she needs it to be resent to another office because that office isnt accept new patient until February   Spoke with patient advise to call insurance for list of eye doctor with in network  Verbalized understanding  [place] Pelto,RMA

## 2023-07-29 ENCOUNTER — Telehealth: Payer: Self-pay

## 2023-07-29 ENCOUNTER — Ambulatory Visit: Admitting: Neurology

## 2023-07-29 ENCOUNTER — Encounter: Payer: Self-pay | Admitting: Neurology

## 2023-07-29 ENCOUNTER — Other Ambulatory Visit (HOSPITAL_COMMUNITY): Payer: Self-pay

## 2023-07-29 ENCOUNTER — Encounter: Payer: Self-pay | Admitting: Oncology

## 2023-07-29 VITALS — BP 134/82 | HR 89 | Ht 62.0 in | Wt 189.0 lb

## 2023-07-29 DIAGNOSIS — M5416 Radiculopathy, lumbar region: Secondary | ICD-10-CM

## 2023-07-29 DIAGNOSIS — H8113 Benign paroxysmal vertigo, bilateral: Secondary | ICD-10-CM | POA: Diagnosis not present

## 2023-07-29 DIAGNOSIS — G43709 Chronic migraine without aura, not intractable, without status migrainosus: Secondary | ICD-10-CM

## 2023-07-29 DIAGNOSIS — R252 Cramp and spasm: Secondary | ICD-10-CM | POA: Diagnosis not present

## 2023-07-29 MED ORDER — SUMATRIPTAN SUCCINATE 100 MG PO TABS: ORAL_TABLET | ORAL | 5 refills | Status: DC

## 2023-07-29 MED ORDER — DIAZEPAM 5 MG PO TABS: ORAL_TABLET | ORAL | 0 refills | Status: DC

## 2023-07-29 NOTE — Progress Notes (Signed)
 Medication Samples have been provided to the patient.  Drug name: ubrelvy        Strength: 100 mg        Qty: 4  LOT: [id number]  Exp.Date: 12/26  Dosing instructions: as needed  The patient has been instructed regarding the correct time, dose, and frequency of taking this medication, including desired effects and most common side effects.   Michalene Agee 11:17 AM 07/29/2023

## 2023-07-29 NOTE — Telephone Encounter (Signed)
 Pharmacy Patient Advocate Encounter   Received notification from Pt Calls Messages that prior authorization for Botox  200UNIT solution is required/requested.   Insurance verification completed.   The patient is insured through [facility].   Per test claim: PA required; PA submitted to above mentioned insurance via CoverMyMeds Key/confirmation #/[facility] Status is pending

## 2023-07-29 NOTE — Telephone Encounter (Signed)
 PA needed for botox  200 units every 90 days.  Procedure code 04540

## 2023-07-29 NOTE — Patient Instructions (Addendum)
 Plan to restart Botox . We will call you to go over pharmacy or Buy/Bill once we get the approval.  Instead of sumatriptan  25mg , take 100mg  at earliest onset of migraine (may repeat after 2 hours - 2 tablets in 24 hours).  Alternative, try samples of Ubrelvy  - take 1 tablet at earliest onset of migraine.  May repeat after 2 hours.  Maximum 2 tablets in 24 hours.   MRI of brain with and without contrast.  Take diazepam  as prescribed but cannot take clonazepam  same day.   Refer to physical therapy for vestibular rehab Also, go to physical therapy for back pain as already scheduled. Follow up 7 months (sooner for Botox )

## 2023-07-29 NOTE — Progress Notes (Signed)
 NEUROLOGY FOLLOW UP OFFICE NOTE  Heidi Holland [id number]  Assessment/Plan:    1. Chronic migraine without aura, without status migrainosus, not intractable  - we will resubmit for Botox .  She meets criteria for Botox .  She has chronic migraines - off Botox  she averaged at least 15 headache days a month.  On Botox , they significantly improved to 2 days a month.  She has failed multiple other preventatives such as Aimovig , topiramate, venlafaxine.  She has Bipolar depression and therefore I wouldn't start propranolol as it may exacerbate her depression.    2.  Bilateral leg cramps, left lumbar radiculopathy 3.  Benign paroxysmal positional vertigo.   Due to increased dizziness and falls, check MRI of brain with and without contrast (claustrophobic so will order open MRI and prescribe diazepam  - does not drive).    For preventative management: Restart Botox   For abortive therapy:  Increase sumatriptan  to 100mg .  Will also have her try samples of Ubrelvy  100mg . Zofran  for nausea Refer to PT for vestibular rehab.  She is scheduled already for PT for back pain.  Limit use of pain relievers to no more than 9 days out of the month to prevent risk of rebound or medication-overuse headache. Headache diary Follow up presumably for Botox .  Total time in chart and face to face with patient:  40 minutes.  Subjective:  Heidi Holland is a 48 year old right-handed Caucasian woman with hypertension, hyperlipidemia, type 2 diabetes, PTSD/anxiety/depression, chronic low back pain and history of TIA who follows up for migraines.   UPDATE: Last seen in August 2023.  Migraines: Continues to have 15 headache days a month.  Would like to go back on Botox , as it was really the only medication that was effective.  Bilateral leg cramps/left lumbar radiculopathy: She has been diagnosed with fibromyalgia.  For several months, she has had cramps in her calves.  She also has numbness down the lateral left leg and  a sharp pain in the center of her left foot.  She has received injections in the past.  Reports falls.  Prior lumbar X-ray showed mild facet arthropathy.  She is scheduled for a thoracic MRI next week.  Labs include ferritin of 6 with low iron, B12 458, vit D 38, Mg 2.3, and negative CRP.  Scheduled for iron infusions.    Vertigo: She reports dizziness for the past 8 months.  Describes it as a spinning sensation.  Occurs when she stands up or if she turns to either direction.  Lasts a few seconds.  Occurs several times a week.  Hasn't had any treatment.    Current NSAIDS:  ASA 325mg , Current analgesics:  hydrocodone  Current triptans:  sumatriptan  25mg  Current ergotamine:  none Current anti-emetic:  Zofran  ODT 4mg  Current muscle relaxants:  Robaxin 750mg  TID PRN, Tizanidine  4mg  PRN Current anti-anxiolytic:  Clonazepam , hydroxyzine  Current sleep aide:  trazodone  Current Antihypertensive medications:  none Current Antidepressant medications:   Current Anticonvulsant medications:  Lyrica  150mg  BID Current anti-CGRP: Current Vitamins/Herbal/Supplements:  none Current Antihistamines/Decongestants:  none Other therapy:  none Hormone/birth control:  none Other medications:  Ambien , quetiapine 200mg    Caffeine:  Caffeine-free soda, 1 cup of coffee 2-3 times a week. Alcohol:  no Smoker:  no Diet:  Needs to improve water intake.  Skips meals.  Diet Coke daily Exercise:  Not routine Depression:  Poor.  Currently treated; Anxiety:  Poor.  Currently treated. Other pain:  Low back pain/lumbar radiculopathy Sleep hygiene:  Improved on medication  HISTORY:  Onset:  Age 70 Location: occiput into neck or top of head.   Quality:  Pressure, throbbing Initial intensity:  8/10.  She denies new headache, thunderclap headache  Aura:  no Premonitory Phase:  Wakes up with heavy eyelids and stiff neck. Postdrome:  hangover effect - exhausted, irritable, neck soreness Associated symptoms:  Neck  stiffness, nausea, sometimes vomiting, photophobia, phonophobia, blurred vision.  She denies associated unilateral numbness or weakness. Initial duration:  Usually 2-3 days (up to a week with various intensity) Initial frequency:  6 times a month (15 days or more a month) Initial frequency of abortive medication: something daily Triggers:  Lack of coffee, menstrual period, when low back pain aggravated, perfumes Relieving factors:  Ice pack, lay in dark Activity:  aggravates She has been to the ED on a couple of occasions for severe intractable migraine.   Past NSAIDS:  Ibuprofen, Aleve Past analgesics:  Excedrin, Tylenol , Fioricet tramadol  Past abortive triptans:   Tosymra  NS (effective but no longer covered by insurance),rizatriptan  10mg  Past abortive ergotamine:  none Past muscle relaxants:  Flexeril  Past anti-emetic:  Phenergan  Past antihypertensive medications:  a blood pressure medication Past antidepressant medications:  Effexor, Trintellix, Rexulti Past anticonvulsant medications:  topiramate, gabapentin , lamotrigine Past anti-CGRP:  Aimovig  Past vitamins/Herbal/Supplements:  none Past antihistamines/decongestants:  none Other past therapies:  Botox  (effective)   She was told by her previous PCP that she had a transient ischemic attack in early 2019.  She was walking and her legs suddenly gave out and her arms weren't working.  She was on the floor for a few minutes.  No unilateral numbness or weakness.  When she got up, she called a friend who tested her on the phone for TIA and symptoms resolved.  She had an MRI and MRA of the brain about a month later, on 07/24/17, which report states were normal.  She was given a diagnosis of TIA and has since been on ASA 325mg  daily.  Her mother has a history of recurrent TIAs while she was treated for lung cancer.     Family history of headache:  no  PAST MEDICAL HISTORY: Past Medical History:  Diagnosis Date   Abnormal Pap smear of  cervix    Allergy    Anxiety    Asthma    Bipolar 1 disorder (HCC)    Chronic pain    Depression    Diabetes mellitus without complication (HCC)    Diverticulitis    GERD (gastroesophageal reflux disease)    HPV in female    Hyperlipidemia    Hypertension    Migraines    Osteoarthritis    Ovarian cyst    right   PCOS (polycystic ovarian syndrome)    STD (sexually transmitted disease)    Trichomonas contact, treated    Vertigo     MEDICATIONS: Current Outpatient Medications on File Prior to Visit  Medication Sig Dispense Refill   AUVELITY 45-105 MG TBCR Take by mouth.     azelastine  (ASTELIN ) 0.1 % nasal spray Place 2 sprays into both nostrils 2 (two) times daily. 30 mL 12   clonazePAM  (KLONOPIN ) 0.5 MG tablet Take 1 tablet (0.5 mg total) by mouth 2 (two) times daily as needed. for anxiety 60 tablet 0   Continuous Glucose Sensor (FREESTYLE LIBRE 3 PLUS SENSOR) MISC Change sensor every 15 days. 2 each 3   desonide  (DESOWEN ) 0.05 % cream Apply topically 2 (two) times daily. 30 g 5   HYDROcodone -acetaminophen  (NORCO) 10-325 MG  tablet Take 1 tablet by mouth every 6 (six) hours as needed.     losartan  (COZAAR ) 50 MG tablet Take 1 tablet (50 mg total) by mouth daily. 90 tablet 1   lumateperone tosylate (CAPLYTA) 42 MG capsule      methocarbamol (ROBAXIN) 750 MG tablet Take 1 tablet by mouth 3 (three) times daily as needed.     ondansetron  (ZOFRAN -ODT) 4 MG disintegrating tablet TAKE 1 TABLET (4 MG TOTAL) BY MOUTH EVERY 6 (SIX) HOURS AS NEEDED FOR NAUSEA OR VOMITING 18 tablet 11   pantoprazole  (PROTONIX ) 40 MG tablet Take 1 tablet (40 mg total) by mouth daily. 90 tablet 1   pregabalin  (LYRICA ) 150 MG capsule TAKE 1 CAPSULE BY MOUTH TWICE A DAY 60 capsule 5   QUEtiapine (SEROQUEL) 200 MG tablet Take 2 tablets by mouth at bedtime.     rosuvastatin  (CRESTOR ) 5 MG tablet TAKE 1 TABLET (5 MG TOTAL) BY MOUTH DAILY. 90 tablet 3   SUMAtriptan  (IMITREX ) 25 MG tablet TAKE 1 TABLET DAILY AS  NEEDED FOR MIGRAINE, MAY REPEAT IN 2 HOURS IF HEADACHE PERSISTS OR RECURS. 9 tablet 2   tirzepatide  (MOUNJARO ) 10 MG/0.5ML Pen Inject 10 mg into the skin once a week. 6 mL 3   tiZANidine  (ZANAFLEX ) 4 MG tablet Take 1 tablet (4 mg total) by mouth 3 (three) times daily. 90 tablet 5   VENTOLIN  HFA 108 (90 Base) MCG/ACT inhaler INHALE 1-2 PUFFS BY MOUTH EVERY 6 HOURS AS NEEDED FOR WHEEZE OR SHORTNESS OF BREATH 18 each 2   zolpidem  (AMBIEN ) 5 MG tablet Take 1 tablet (5 mg total) by mouth at bedtime as needed for sleep. 30 tablet 5   No current facility-administered medications on file prior to visit.    ALLERGIES: Allergies  Allergen Reactions   Ciprofloxacin Itching    FAMILY HISTORY: Family History  Problem Relation Age of Onset   Stroke Mother    Heart disease Mother    Lung cancer Mother    Rheum arthritis Mother    Arthritis Mother    COPD Mother    Fibromyalgia Mother    Hypertension Father    Diabetes Father    Heart disease Father    Hodgkin's lymphoma Father    Endometriosis Sister    Drug abuse Brother        died of overdose   Breast cancer Paternal Aunt    Diabetes Maternal Grandmother    Lung cancer Maternal Grandmother    Other Maternal Grandmother        pace maker   Skin cancer Paternal Grandmother       Objective:  Blood pressure 134/82, pulse 89, height 5' 2 (1.575 m), weight 189 lb (85.7 kg), last menstrual period 07/14/2023, SpO2 99%. General: No acute distress.  Patient appears well-groomed.   Head:  Normocephalic/atraumatic Eyes:  Fundi examined but not visualized Neck: supple, no paraspinal tenderness, full range of motion Heart:  Regular rate and rhythm Lungs:  Clear to auscultation bilaterally Back: No paraspinal tenderness Neurological Exam: alert and oriented.  Speech fluent and not dysarthric, language intact.  CN II-XII intact. Bulk and tone normal, muscle strength 5/5 throughout.  Sensation to pinprick reduced on dorsum of left foot and leg  except posterior leg.  Vibratory sensation intact.  Deep tendon reflexes 2+ throughout, toes downgoing.  Finger to nose testing intact.  Gait normal, Difficulty with tandem walking.  Slow and cautious.  Romberg negative.   Janne Members, DO  CC: Valdene Garret, MD

## 2023-07-29 NOTE — Telephone Encounter (Signed)
 PA request has been Submitted. New Encounter has been or will be created for follow up. For additional info see Pharmacy Prior Auth telephone encounter from 07-29-2023.

## 2023-07-31 NOTE — Telephone Encounter (Signed)
 Pharmacy Patient Advocate Encounter  Received notification from [employer] Prior Authorization for Botox  200UNIT solution has been APPROVED from 07-29-2023 to 01-28-2024   PA #/Case ID/Reference #: [id number]

## 2023-08-01 ENCOUNTER — Telehealth: Payer: Self-pay | Admitting: Pharmacy Technician

## 2023-08-01 ENCOUNTER — Encounter: Payer: Self-pay | Admitting: Oncology

## 2023-08-01 ENCOUNTER — Inpatient Hospital Stay

## 2023-08-01 ENCOUNTER — Other Ambulatory Visit (HOSPITAL_COMMUNITY): Payer: Self-pay

## 2023-08-01 VITALS — BP 140/85 | HR 70 | Temp 98.1°F | Resp 18

## 2023-08-01 DIAGNOSIS — D509 Iron deficiency anemia, unspecified: Secondary | ICD-10-CM | POA: Diagnosis not present

## 2023-08-01 MED ORDER — ACETAMINOPHEN 325 MG PO TABS
650.0000 mg | ORAL_TABLET | Freq: Once | ORAL | Status: AC
  Administered 2023-08-01: 650 mg via ORAL
  Filled 2023-08-01: qty 2

## 2023-08-01 MED ORDER — DIPHENHYDRAMINE HCL 25 MG PO CAPS
25.0000 mg | ORAL_CAPSULE | Freq: Once | ORAL | Status: AC
  Filled 2023-08-01: qty 1

## 2023-08-01 MED ORDER — SODIUM CHLORIDE 0.9 % IV SOLN: INTRAVENOUS | Status: DC

## 2023-08-01 MED ORDER — SODIUM CHLORIDE 0.9 % IV SOLN
510.0000 mg | Freq: Once | INTRAVENOUS | Status: AC
  Filled 2023-08-01: qty 510

## 2023-08-01 NOTE — Telephone Encounter (Signed)
 Pharmacy Patient Advocate Encounter   Botox  One Portal verification has been Submitted Benefit Verification #:  [id number]   Primary Insurance: [facility] MEDICAID Dx Code: G43.709 J-code: Botox - W0981 Procedure code: 19147

## 2023-08-01 NOTE — Progress Notes (Signed)
 The patient received intravenous iron without any adverse reactions and tolerated the procedure well. She is currently waiting for the 30-minute observation period. The IV site remained intact with no signs of redness. The patient was stable at the time of departure from the center.

## 2023-08-01 NOTE — Patient Instructions (Signed)

## 2023-08-01 NOTE — Telephone Encounter (Signed)
 Pharmacy Patient Advocate Encounter  [employer] verification has been submitted on 6.19.25. Benefit Verification #:  [id number]

## 2023-08-02 NOTE — Telephone Encounter (Signed)
 Pharmacy Patient Advocate Encounter  Botox  One Portal verification has been completed.  Benefit Verification #:  [id number]    Dx Code: G43.709  J-code: Botox - Z6109 Does require PA Procedure code: 60454 Does Not require PA  Primary Insurance: [employer] MEDICAID Patient's remaining deductible: 0 Patient estimated coinsurance/copay for med: 0 Patient estimated coinsurance/copay for admin code: 0

## 2023-08-03 ENCOUNTER — Other Ambulatory Visit: Payer: Self-pay | Admitting: Family Medicine

## 2023-08-05 ENCOUNTER — Ambulatory Visit

## 2023-08-06 ENCOUNTER — Other Ambulatory Visit: Payer: Self-pay

## 2023-08-06 ENCOUNTER — Other Ambulatory Visit (HOSPITAL_COMMUNITY): Payer: Self-pay

## 2023-08-06 ENCOUNTER — Other Ambulatory Visit: Payer: Self-pay | Admitting: Pharmacy Technician

## 2023-08-06 ENCOUNTER — Telehealth: Payer: Self-pay

## 2023-08-06 DIAGNOSIS — H8113 Benign paroxysmal vertigo, bilateral: Secondary | ICD-10-CM

## 2023-08-06 DIAGNOSIS — G43709 Chronic migraine without aura, not intractable, without status migrainosus: Secondary | ICD-10-CM

## 2023-08-06 MED ORDER — ONABOTULINUMTOXINA 200 UNITS IJ SOLR
INTRAMUSCULAR | 4 refills | Status: DC
  Filled 2023-08-06: qty 1, fill #0
  Filled 2023-08-12: qty 1, 34d supply, fill #0

## 2023-08-06 NOTE — Telephone Encounter (Signed)
 Sorry :-/  Pharmacy Patient Advocate Encounter- Injection via Pharmacy Benefit:  PA was submitted  for Botox - [phone] to [facility] MEDICAID and has been approved through  Please send prescription to Specialty Pharmacy: [facility]: [phone]  Estimated Pharmacy Copay is: $4

## 2023-08-06 NOTE — Telephone Encounter (Signed)
 Patient complained that the open MRI a [facility] not open enough.  Wants to change location for sedation  .

## 2023-08-06 NOTE — Telephone Encounter (Signed)
 Patient advised.

## 2023-08-07 ENCOUNTER — Other Ambulatory Visit (HOSPITAL_COMMUNITY): Payer: Self-pay

## 2023-08-07 ENCOUNTER — Ambulatory Visit

## 2023-08-07 DIAGNOSIS — F4323 Adjustment disorder with mixed anxiety and depressed mood: Secondary | ICD-10-CM | POA: Diagnosis not present

## 2023-08-07 NOTE — Telephone Encounter (Signed)
 Patient advised of Dr.Jaffe note, That is fine but let patient know that the hospital is booked out and she needs a physical exam within 30 days of the MRI     Patient would like to have that scheduled.   Per patient she do not have [facility] anymore just [facility].  Front desk can you check on that for me please.

## 2023-08-08 ENCOUNTER — Inpatient Hospital Stay

## 2023-08-08 VITALS — BP 129/80 | HR 73 | Temp 98.0°F | Resp 18

## 2023-08-08 DIAGNOSIS — D509 Iron deficiency anemia, unspecified: Secondary | ICD-10-CM

## 2023-08-08 MED ORDER — SODIUM CHLORIDE 0.9 % IV SOLN
510.0000 mg | Freq: Once | INTRAVENOUS | Status: AC
  Administered 2023-08-08: 510 mg via INTRAVENOUS
  Filled 2023-08-08: qty 510

## 2023-08-08 MED ORDER — SODIUM CHLORIDE 0.9 % IV SOLN: INTRAVENOUS | Status: DC

## 2023-08-08 NOTE — Patient Instructions (Signed)

## 2023-08-08 NOTE — Progress Notes (Signed)
 Patient took her pre medications (tylenol  and benadryl ) prior to appointment.

## 2023-08-09 ENCOUNTER — Other Ambulatory Visit: Payer: Self-pay | Admitting: Family Medicine

## 2023-08-09 ENCOUNTER — Other Ambulatory Visit (HOSPITAL_COMMUNITY): Payer: Self-pay

## 2023-08-12 ENCOUNTER — Other Ambulatory Visit (HOSPITAL_COMMUNITY): Payer: Self-pay

## 2023-08-12 ENCOUNTER — Other Ambulatory Visit: Payer: Self-pay | Admitting: Pharmacy Technician

## 2023-08-12 ENCOUNTER — Other Ambulatory Visit: Payer: Self-pay

## 2023-08-12 NOTE — Progress Notes (Signed)
 Specialty Pharmacy Initial Fill Coordination Note  Teneshia Hedeen is a 48 y.o. female contacted today regarding initial fill of specialty medication(s) OnabotulinumtoxinA  (BOTOX )   Patient requested Courier to Provider Office   Delivery date: 08/21/23   Verified address: [facility]  [address] [place] [address] [place], [place], [address]   Medication will be filled on 7.8.2025.   Patient is aware of $4 copayment.

## 2023-08-12 NOTE — Progress Notes (Signed)
 Pt did not provide CC information and opted to call the pharmacy instead. I told patient it would not go out if she did not call and her appt would be cancelled if the office does not have the medication next week by Monday.

## 2023-08-14 ENCOUNTER — Other Ambulatory Visit (HOSPITAL_COMMUNITY): Payer: Self-pay

## 2023-08-15 ENCOUNTER — Encounter: Payer: Self-pay | Admitting: Physical Therapy

## 2023-08-15 ENCOUNTER — Ambulatory Visit: Attending: Neurology | Admitting: Physical Therapy

## 2023-08-15 DIAGNOSIS — M5459 Other low back pain: Secondary | ICD-10-CM | POA: Diagnosis present

## 2023-08-15 DIAGNOSIS — H8113 Benign paroxysmal vertigo, bilateral: Secondary | ICD-10-CM | POA: Diagnosis not present

## 2023-08-15 DIAGNOSIS — M25552 Pain in left hip: Secondary | ICD-10-CM | POA: Diagnosis present

## 2023-08-15 DIAGNOSIS — M546 Pain in thoracic spine: Secondary | ICD-10-CM | POA: Insufficient documentation

## 2023-08-15 DIAGNOSIS — H8112 Benign paroxysmal vertigo, left ear: Secondary | ICD-10-CM | POA: Diagnosis present

## 2023-08-15 DIAGNOSIS — H8111 Benign paroxysmal vertigo, right ear: Secondary | ICD-10-CM | POA: Diagnosis present

## 2023-08-15 NOTE — Therapy (Signed)
 OUTPATIENT PHYSICAL THERAPY THORACOLUMBAR EVALUATION   Patient Name: Heidi Holland MRN: [id number] DOB:1975/09/06, 48 y.o., female Today's Date: 08/15/2023  END OF SESSION:  PT End of Session - 08/15/23 1112     Visit Number 1    Number of Visits 17   eval + 8 land visits, 8 aquatic visits   Authorization Type [employer] Medicaid - no auth required    Authorization - Number of Visits 27    PT Start Time 0803    PT Stop Time 0851    PT Time Calculation (min) 48 min    Activity Tolerance Patient tolerated treatment well    Behavior During Therapy WFL for tasks assessed/performed          Past Medical History:  Diagnosis Date   Abnormal Pap smear of cervix    Allergy    Anxiety    Asthma    Bipolar 1 disorder (HCC)    Chronic pain    Depression    Diabetes mellitus without complication (HCC)    Diverticulitis    GERD (gastroesophageal reflux disease)    HPV in female    Hyperlipidemia    Hypertension    Migraines    Osteoarthritis    Ovarian cyst    right   PCOS (polycystic ovarian syndrome)    STD (sexually transmitted disease)    Trichomonas contact, treated    Vertigo    Past Surgical History:  Procedure Laterality Date   BOWEL RESECTION     gasticbypass     GASTRIC BYPASS     KNEE ARTHROSCOPY Bilateral    SHOULDER SURGERY Left    TOTAL HIP ARTHROPLASTY Left    Patient Active Problem List   Diagnosis Date Noted   Meralgia paresthetica of left side 07/15/2023   Fibromyalgia 06/27/2023   Ovarian cyst 02/20/2023   Essential hypertension 02/20/2023   Bipolar 1 disorder, depressed (HCC) 02/16/2022   Paresthesia 09/13/2021   CIN I (cervical intraepithelial neoplasia I) 03/03/2021   LSIL pap smear of cervix/human papillomavirus (HPV) positive 10/24/2020   Family history of early CAD 10/24/2020   B12 deficiency 07/26/2020   Somatic dysfunction of spine, cervical 07/21/2020   Status post left hip replacement 03/12/2019   T2DM (type 2 diabetes mellitus) (HCC)  07/31/2018   Dyslipidemia associated with type 2 diabetes mellitus (HCC) 07/31/2018   Allergic rhinitis 07/31/2018   Mild intermittent asthma, uncomplicated 07/31/2018   GERD (gastroesophageal reflux disease) 07/31/2018   S/P gastric bypass 07/31/2018   Migraine 07/31/2018   Anxiety 07/31/2018   Depression, major, single episode, complete remission (HCC) 07/31/2018   Insomnia 07/31/2018   Umbilical hernia 07/31/2018   Iron deficiency anemia 06/09/2018   Cervicogenic headache 05/12/2018   Low back pain 05/12/2018   Polyarthralgia 05/12/2018    PCP: Kennyth Bart, MD  REFERRING PROVIDER: Joane Artist RAMAN, MD; Skeet Juliene SAUNDERS, DO  REFERRING DIAG:  Diagnosis  M25.50 (ICD-10-CM) - Polyarthralgia  M79.7 (ICD-10-CM) - Fibromyalgia  R53.1 (ICD-10-CM) - Weakness   H81.13 (ICD-10-CM) - Benign paroxysmal positional vertigo due to bilateral vestibular disorder  Rationale for Evaluation and Treatment: Rehabilitation  THERAPY DIAG:  Pain in left hip  Pain in thoracic spine  Other low back pain  BPPV (benign paroxysmal positional vertigo), left  ONSET DATE: early 2024 for onset of chronic pain: Referral date 07-15-23  SUBJECTIVE:  SUBJECTIVE STATEMENT:  Pt is referred for vertigo by Dr. Skeet - has migraines - says he is ordering a MRI but she is claustrophobic so she has to have a stand up MRI (located in [place]);  pt is referred for chronic pain due to fibromyalgia and polyarthralgia by Dr. Joane; pt reports the pain and weakness are her main concern, more so than the vertigo at this time - pt reports the vertigo occurs intermittently Pt reports she moved from [place]  several years ago but has not had any PT since moving to [place]: was evaluated at [facility] for LE pain & weakness in July 2020 - attended  eval only (no follow up treatment sessions attended)   Pt reports Lt leg feels weaker than RLE; says RLE is starting to get weaker based on compensation for LLE weakness; has a lot of nerve pain in LLE Currently not doing any exercises  Per chart note from Dr. Joane on 07-15-23:   Heidi Holland is a 48 y.o. female who presents to [facility] at [facility] today for polyarthralgia. L hip replacement in 2019. PCP and rheum dx her w/ fibromyalgia. Now bilat knees hurt, L>R. Hx of arthroscopy in both knees. She notes chronic pain for the past year and a half. Has had to decrease work to half days. She notes over weakness and lack of strength. Pt also c/o L lateral thigh pain w/ radiating pain distally to her L foot.    PERTINENT HISTORY:  Lt hip THR Nov. 2019, migraines, s/p gastric bypass, fibromyalgia, polyarthralgia  PAIN:  Are you having pain? Yes: NPRS scale: 5/10 (today is a good day) Pain location: generalized Pain description: achy, throbbing, arthritic feeling Aggravating factors: moving or lying down; standing for too long period of time Relieving factors: pt has routine - has TENS unit, heating pad, wrist splints, compression gloves for hands and compression stockings  PRECAUTIONS: Other: None  RED FLAGS: None   WEIGHT BEARING RESTRICTIONS: No  FALLS:  Has patient fallen in last 6 months? No and several stumbles - did fall off of bed couple of months ago - was sitting [place] style on bed and lost balance reaching down for something  LIVING ENVIRONMENT: Lives with: lives alone Lives in: Other Duplex Stairs: 1 step only to enter  Has following equipment at home: None  OCCUPATION: works part-time in Engineering geologist  PLOF: Independent  PATIENT GOALS: be able to gain strength; reduce pain  NEXT MD VISIT: 08-19-23 with Dr. Joane:  08-23-23 with Dr. Skeet for Botox  injections  OBJECTIVE:  Note: Objective measures were completed at Evaluation unless otherwise  noted.  DIAGNOSTIC FINDINGS:  MRI is scheduled in Oct. - must be standing MRI per pt due to claustrophobia  PATIENT SURVEYS:  Modified Oswestry:  MODIFIED OSWESTRY DISABILITY SCALE  Date: 08-15-23 Score  Pain intensity 3 =  Pain medication provides me with moderate relief from pain.  2. Personal care (washing, dressing, etc.) 2 =  It is painful to take care of myself, and I am slow and careful.  3. Lifting 4 = I can lift only very light weights  4. Walking 3 =  Pain prevents me from walking more than  mile.  5. Sitting 1 =  I can only sit in my favorite chair as long as I like.  6. Standing 1 =  I can stand as long as I want but, it increases my pain.  7. Sleeping 3 =  Even when I take  pain medication, I sleep less than 4 hours.  8. Social Life 3 =  Pain prevents me from going out very often.  9. Traveling 2 =  My pain restricts my travel over 2 hours.  10. Employment/ Homemaking 3 = Pain prevents me from doing anything but light duties.  Total 25/50 ( 50% =severe disability)   Interpretation of scores: Score Category Description  0-20% Minimal Disability The patient can cope with most living activities. Usually no treatment is indicated apart from advice on lifting, sitting and exercise  21-40% Moderate Disability The patient experiences more pain and difficulty with sitting, lifting and standing. Travel and social life are more difficult and they may be disabled from work. Personal care, sexual activity and sleeping are not grossly affected, and the patient can usually be managed by conservative means  41-60% Severe Disability Pain remains the main problem in this group, but activities of daily living are affected. These patients require a detailed investigation  61-80% Crippled Back pain impinges on all aspects of the patient's life. Positive intervention is required  81-100% Bed-bound  These patients are either bed-bound or exaggerating their symptoms  Bluford FORBES Zoe DELENA Karon DELENA,  et al. Surgery versus conservative management of stable thoracolumbar fracture: the PRESTO feasibility RCT. [place] ([place]): [employer]; 2021 Nov. [facility] Technology Assessment, No. 25.62.) Appendix 3, Oswestry Disability Index category descriptors. Available from: FindJewelers.cz  Minimally Clinically Important Difference (MCID) = 12.8%  COGNITION: Overall cognitive status: Within functional limits for tasks assessed     SENSATION: Numbness and tingling down Lt lateral thigh and sometimes in her hands when she wakes up   POSTURE: No Significant postural limitations  PALPATION: Tenderness with palpation of spinous process area of approx. T10-T12 vertebral level  LUMBAR ROM: WFL's for all motions  AROM eval  Flexion   Extension   Right lateral flexion   Left lateral flexion   Right rotation   Left rotation    (Blank rows = not tested)  LOWER EXTREMITY ROM:  WNL's bil. LE's    LOWER EXTREMITY MMT:    MMT Right eval Left eval  Hip flexion 5 4 - c/o pain with resistance  Hip extension 5 4  Hip abduction 5 4  Hip adduction    Hip internal rotation    Hip external rotation    Knee flexion 4 c/o pain with resistance 4 c/o pain with resistance  Knee extension 5 5  Ankle dorsiflexion 5 5  Ankle plantarflexion    Ankle inversion    Ankle eversion     (Blank rows = not tested)  LUMBAR SPECIAL TESTS:  N/A  FUNCTIONAL TESTS:  5 times sit to stand: 19.34 secs Timed up and go (TUG): 11.22 secs without device 10 meter walk test: 10.10 secs = 3.25 ft/sec without device  GAIT: Distance walked: 30'  Assistive device utilized: None Level of assistance: Complete Independence Comments: pt reports today is a good day - reports gait pattern changes when pain is increased  Vertigo Assessment: Rt Dix-Hallpike test - pt reported vertigo but no nystagmus noted in test position Lt Dix-Hallpike test (+) for Lt rotary upbeating nystagmus  and c/o vertigo in test position  TREATMENT DATE: 08-15-23  Epley maneuver performed for Lt BPPV posterior canalithiasis; 1 rep only due to time constraint in initial eval session   PATIENT EDUCATION:  Education details: article on BPPV etiology, aquatic info given to pt Person educated: Patient Education method: Chief Technology Officer Education comprehension: verbalized understanding  HOME EXERCISE PROGRAM: To be established  ASSESSMENT:  CLINICAL IMPRESSION: Patient is a 48 y.o. lady who was seen today for physical therapy evaluation and treatment for LLE pain and weakness, low back pain, and vertigo.  Pt reports the pain, weakness and fatigue are her main problems at this time, more so than the vertigo.  Eval focused on lumbar ROM and LE strengthening, gait and mobility assessment with vertigo assessment performed near end of session.  Pt did have (+) Lt Dix-Hallpike test with Lt rotary upbeating nystagmus of moderate intensity, indicative of Lt BPPV posterior canalithiasis.  Pt was treated with 1 rep of Epley maneuver only due to time constraint.  Pt has fibromyalgia and polyarthralgia and rates pain 5/10 intensity, stating today is a good day.  Pt's LE and lumbar AROM and strength are WNL's, but pt reports strength varies depending on severity of pain.  Pt did report pain with resistance with MMT with LLE>RLE for c/o pain.  Pt's TUG score = 11.22 secs without device (not indicative of fall risk); gait velocity = 3.25 ft/sec without use of assistive device.  Pt did report pain with palpation of approx. T10-T12 spinous process region, stating this pain location fairly new occurrence.  Pt will benefit from PT (both land and aquatic based) to address low back and LE weakness and pain and Lt BPPV.    OBJECTIVE IMPAIRMENTS: decreased activity tolerance, decreased  endurance, difficulty walking, decreased ROM, decreased strength, and pain.   ACTIVITY LIMITATIONS: carrying, lifting, bending, squatting, and locomotion level  PARTICIPATION LIMITATIONS: cleaning, laundry, shopping, community activity, occupation, and pt does not currently drive  PERSONAL FACTORS: Behavior pattern, Past/current experiences, Time since onset of injury/illness/exacerbation, and 1-2 comorbidities: fibromyalgia and s/p Lt THR are also affecting patient's functional outcome.   REHAB POTENTIAL: Good  CLINICAL DECISION MAKING: Evolving/moderate complexity  EVALUATION COMPLEXITY: Moderate   GOALS: Goals reviewed with patient? Yes  SHORT TERM GOALS: Target date: 09-13-23  Initiate aquatic therapy to assist with pain management.  Baseline:  to be scheduled Goal status: INITIAL  2.  Pt will report reduced back pain to </= 4/10 to increase tolerance and ease with mobility, ADL's and work activities. Baseline: 5/10 on a good day Goal status: INITIAL  3.  Pt will have a (-) Lt Dix-Hallpike test with no nystagmus and no c/o vertigo to indicate resolution of Lt BPPV. Baseline: (+) Lt Dix-Hallpike test on 08-15-23 Goal status: INITIAL  4.  Improve Modified Oswestry questionnaire score to </= 40% to demo improvement in LBP and functional capabilities.  Baseline: 50% (25/50) on 08-15-23 Goal status: INITIAL  5.  Independent in land based HEP for LE and low strengthening & stretching exercises. Baseline: Dependent Goal status: INITIAL   LONG TERM GOALS: Target date: 10-11-23  Pt will report reduced back pain to </= 3/10 to increase tolerance and ease with mobility, ADL's and work activities. Baseline: 5/10 on a good day Goal status: INITIAL  2.  Improve Modified Oswestry questionnaire score to </= 30% to demo improvement in LBP and functional capabilities.  Baseline: 50% (25/50) on 08-15-23 Goal status: INITIAL  3.  Pt will improve 5x sit to stand transfer score to </= 15  secs to demo  increased LE strength and reduced back pain with this movement. Baseline: 19.34 secs without UE support from chair Goal status: INITIAL  4.  Independent in aquatic HEP and updated land based HEP to be continued upon discharge from PT to improve functional mobility and assist with pain management. Baseline: Dependent Goal status: INITIAL  5.  Pt will verbalize understanding of Epley maneuver for self treatment of BPPV if needed pending episodic re-occurrence in future.  Baseline: Dependent Goal status: INITIAL   PLAN:  PT FREQUENCY: 2x/week (1 land/1 aquatic)  PT DURATION: 8 weeks  PLANNED INTERVENTIONS: 97110-Therapeutic exercises, 97530- Therapeutic activity, [id number]- Neuromuscular re-education, 97535- Self Care, 02859- Manual therapy, [id number]- Gait training, [phone]- Canalith repositioning, [id number]- Aquatic Therapy, and [id number]- Electrical stimulation (manual).  PLAN FOR NEXT SESSION: land - recheck Lt BPPV; begin manual therapy for mid & Low back areas, HEP for stretching  Aquatic - water walking, [place] for pain management , LE strengthening and core stabilization   Khoen Genet, [place], PT 08/15/2023, 11:33 AM   Check all possible CPT codes: 02889- Therapeutic Exercise, [phone]- Neuro Re-education, [phone] - Gait Training, [phone] - Manual Therapy, [phone] - Therapeutic Activities, [phone] - Self Care, [phone] - Electrical stimulation (Manual), [id number] - Aquatic therapy, and [phone] - Canalith Repositioning    Check all conditions that are expected to impact treatment: Musculoskeletal disorders and Psychological or psychiatric disorders   If treatment provided at initial evaluation, no treatment charged due to lack of authorization.

## 2023-08-19 ENCOUNTER — Other Ambulatory Visit: Payer: Self-pay

## 2023-08-19 ENCOUNTER — Ambulatory Visit: Admitting: Family Medicine

## 2023-08-19 ENCOUNTER — Ambulatory Visit (INDEPENDENT_AMBULATORY_CARE_PROVIDER_SITE_OTHER)

## 2023-08-19 ENCOUNTER — Encounter: Payer: Self-pay | Admitting: Family Medicine

## 2023-08-19 VITALS — BP 110/74 | HR 81 | Ht 62.0 in | Wt 178.0 lb

## 2023-08-19 DIAGNOSIS — M79644 Pain in right finger(s): Secondary | ICD-10-CM

## 2023-08-19 DIAGNOSIS — M2550 Pain in unspecified joint: Secondary | ICD-10-CM

## 2023-08-19 DIAGNOSIS — M797 Fibromyalgia: Secondary | ICD-10-CM | POA: Diagnosis not present

## 2023-08-19 MED ORDER — KETOROLAC TROMETHAMINE 60 MG/2ML IM SOLN
60.0000 mg | Freq: Once | INTRAMUSCULAR | Status: AC
Start: 2023-08-19 — End: 2023-08-19
  Administered 2023-08-19: 60 mg via INTRAMUSCULAR

## 2023-08-19 MED ORDER — PREDNISONE 5 MG (48) PO TBPK: ORAL_TABLET | ORAL | 0 refills | Status: DC

## 2023-08-19 NOTE — Progress Notes (Signed)
 I, Leotis Batter, CMA acting as a scribe for Artist Lloyd, MD.  Heidi Holland is a 48 y.o. female who presents to [facility] at [facility] today for polyarthralgia; L meralgia paresthetica, bilat knee pain, periscapular pain, and fibromyalgia. Pt was last seen by Dr. Lloyd on 07/15/23 and was given a left lateral femoral cutaneous nerve steroid injection and was advised to use a self-guided cognitive behavioral therapy workbook. She only completed the initial PT evaluation.   Today, pt reports exacerbation of pain d/t recent Fibro flare. Would like to discuss possible steroid injection for hand, foot, and/or back. Also considering low dose oral steroid. Has been using TENS, heating pads, stretching. Did work 2 days last week, missed a week of work d/t sx.   Dx testing: 07/15/23 R & L knee XR 06/27/23 Labs   Pertinent review of systems: No fevers or chills  Relevant historical information: Hypertension diabetes fibromyalgia.   Exam:  BP 110/74   Pulse 81   Ht 5' 2 (1.575 m)   Wt 178 lb (80.7 kg)   SpO2 98%   BMI 32.56 kg/m  General: Well Developed, well nourished, and in no acute distress.   MSK: Right hand and minimal swelling at first [facility].  Tender palpation around first CMC.  Normal hand motion.    Lab and Radiology Results  Procedure: Real-time Ultrasound Guided Injection of right hand first [facility] Device: Philips Affiniti 50G/GE Logiq Images permanently stored and available for review in PACS Verbal informed consent obtained.  Discussed risks and benefits of procedure. Warned about infection, bleeding, hyperglycemia damage to structures among others. Patient expresses understanding and agreement Time-out conducted.   Noted no overlying erythema, induration, or other signs of local infection.   Skin prepped in a sterile fashion.   Local anesthesia: Topical Ethyl chloride.   With sterile technique and under real time ultrasound guidance: 40 mg of Kenalog  and 1  mL of lidocaine  injected into first CMC. Fluid seen entering the joint capsule.   Completed without difficulty   Pain immediately resolved suggesting accurate placement of the medication.   Advised to call if fevers/chills, erythema, induration, drainage, or persistent bleeding.   Images permanently stored and available for review in the ultrasound unit.  Impression: Technically successful ultrasound guided injection.    X-ray images right hand obtained today personally and independently interpreted. Notable DJD.  No acute fractures are visible. Await formal radiology review    Assessment and Plan: 48 y.o. female with right hand pain in the setting of a fibromyalgia flare.  Pain is located around the base of the thumb.  Plan for injection at first CMC.  Additionally she is having an overall fibromyalgia flare.  Plan for Toradol  injection and prednisone  Dosepak.  She already has pretty well optimized with fibromyalgia with Lyrica .  Recheck back in about 9 weeks or later. PDMP not reviewed this encounter. Orders Placed This Encounter  Procedures   US  LIMITED JOINT SPACE STRUCTURES UP RIGHT(NO LINKED CHARGES)    Reason for Exam (SYMPTOM  OR DIAGNOSIS REQUIRED):   right thumb pain    Preferred imaging location?:   [facility] [place] [facility] Hand Complete Right    Standing Status:   Future    Number of Occurrences:   1    Expiration Date:   09/19/2023    Reason for Exam (SYMPTOM  OR DIAGNOSIS REQUIRED):   right hand pain    Preferred imaging location?:   [facility]  Is patient pregnant?:   No   Meds ordered this encounter  Medications   predniSONE  (STERAPRED UNI-PAK 48 TAB) 5 MG (48) TBPK tablet    Sig: 12 day dosepack po    Dispense:  48 tablet    Refill:  0   ketorolac  (TORADOL ) injection 60 mg     Discussed warning signs or symptoms. Please see discharge instructions. Patient expresses understanding.   The above documentation has been reviewed  and is accurate and complete Artist Lloyd, M.D.

## 2023-08-19 NOTE — Patient Instructions (Addendum)
 Thank you for coming in today.   You received an injection today. Seek immediate medical attention if the joint becomes red, extremely painful, or is oozing fluid.   Please get an Xray today before you leave   Injection in the backside today, Ketorolac  60 mg/ 2 mL  See you back after Sept 15th

## 2023-08-20 ENCOUNTER — Ambulatory Visit: Payer: Self-pay | Admitting: Family Medicine

## 2023-08-20 ENCOUNTER — Other Ambulatory Visit: Payer: Self-pay

## 2023-08-20 ENCOUNTER — Other Ambulatory Visit (HOSPITAL_COMMUNITY): Payer: Self-pay

## 2023-08-20 NOTE — Progress Notes (Signed)
 Right hand x-ray shows no fractures.  No severe arthritis.  The radiologist sees where I did the injection.

## 2023-08-21 ENCOUNTER — Encounter: Payer: Self-pay | Admitting: Oncology

## 2023-08-21 ENCOUNTER — Ambulatory Visit: Admitting: Physical Therapy

## 2023-08-21 DIAGNOSIS — M25552 Pain in left hip: Secondary | ICD-10-CM

## 2023-08-21 DIAGNOSIS — M546 Pain in thoracic spine: Secondary | ICD-10-CM

## 2023-08-21 DIAGNOSIS — M5459 Other low back pain: Secondary | ICD-10-CM

## 2023-08-22 ENCOUNTER — Ambulatory Visit: Payer: Self-pay | Admitting: Physical Therapy

## 2023-08-22 ENCOUNTER — Encounter: Payer: Self-pay | Admitting: Physical Therapy

## 2023-08-22 DIAGNOSIS — M25552 Pain in left hip: Secondary | ICD-10-CM | POA: Diagnosis not present

## 2023-08-22 DIAGNOSIS — M5459 Other low back pain: Secondary | ICD-10-CM

## 2023-08-22 DIAGNOSIS — H8112 Benign paroxysmal vertigo, left ear: Secondary | ICD-10-CM

## 2023-08-22 NOTE — Therapy (Signed)
 OUTPATIENT PHYSICAL THERAPY/AQUATIC THERAPY TREATMENT NOTE   Patient Name: Heidi Holland MRN: [id number] DOB:Nov 21, 1975, 48 y.o., female Today's Date: 08/22/2023  END OF SESSION:  PT End of Session - 08/22/23 0948     Visit Number 2    Number of Visits 17   eval + 8 land visits, 8 aquatic visits   Date for PT Re-Evaluation 10/04/23    Authorization Type [employer] - no auth required    Authorization - Visit Number 2    Authorization - Number of Visits 27    PT Start Time 1400    PT Stop Time 1445    PT Time Calculation (min) 45 min    Equipment Utilized During Treatment Other (comment)   handbells, barbells, aquatic cuffs   Activity Tolerance Patient tolerated treatment well    Behavior During Therapy WFL for tasks assessed/performed          Past Medical History:  Diagnosis Date   Abnormal Pap smear of cervix    Allergy    Anxiety    Asthma    Bipolar 1 disorder (HCC)    Chronic pain    Depression    Diabetes mellitus without complication (HCC)    Diverticulitis    GERD (gastroesophageal reflux disease)    HPV in female    Hyperlipidemia    Hypertension    Migraines    Osteoarthritis    Ovarian cyst    right   PCOS (polycystic ovarian syndrome)    STD (sexually transmitted disease)    Trichomonas contact, treated    Vertigo    Past Surgical History:  Procedure Laterality Date   BOWEL RESECTION     gasticbypass     GASTRIC BYPASS     KNEE ARTHROSCOPY Bilateral    SHOULDER SURGERY Left    TOTAL HIP ARTHROPLASTY Left    Patient Active Problem List   Diagnosis Date Noted   Meralgia paresthetica of left side 07/15/2023   Fibromyalgia 06/27/2023   Ovarian cyst 02/20/2023   Essential hypertension 02/20/2023   Bipolar 1 disorder, depressed (HCC) 02/16/2022   Paresthesia 09/13/2021   CIN I (cervical intraepithelial neoplasia I) 03/03/2021   LSIL pap smear of cervix/human papillomavirus (HPV) positive 10/24/2020   Family history of early CAD 10/24/2020    B12 deficiency 07/26/2020   Somatic dysfunction of spine, cervical 07/21/2020   Status post left hip replacement 03/12/2019   T2DM (type 2 diabetes mellitus) (HCC) 07/31/2018   Dyslipidemia associated with type 2 diabetes mellitus (HCC) 07/31/2018   Allergic rhinitis 07/31/2018   Mild intermittent asthma, uncomplicated 07/31/2018   GERD (gastroesophageal reflux disease) 07/31/2018   S/P gastric bypass 07/31/2018   Migraine 07/31/2018   Anxiety 07/31/2018   Depression, major, single episode, complete remission (HCC) 07/31/2018   Insomnia 07/31/2018   Umbilical hernia 07/31/2018   Iron deficiency anemia 06/09/2018   Cervicogenic headache 05/12/2018   Low back pain 05/12/2018   Polyarthralgia 05/12/2018    PCP: Kennyth Bart, MD  REFERRING PROVIDER: Joane Artist RAMAN, MD; Skeet Juliene SAUNDERS, DO  REFERRING DIAG:  Diagnosis  M25.50 (ICD-10-CM) - Polyarthralgia  M79.7 (ICD-10-CM) - Fibromyalgia  R53.1 (ICD-10-CM) - Weakness   H81.13 (ICD-10-CM) - Benign paroxysmal positional vertigo due to bilateral vestibular disorder  Rationale for Evaluation and Treatment: Rehabilitation  THERAPY DIAG:  Other low back pain  Pain in left hip  Pain in thoracic spine  ONSET DATE: early 2024 for onset of chronic pain: Referral date 07-15-23  SUBJECTIVE:  SUBJECTIVE STATEMENT:  Pt presents for aquatic PT at [facility]; Pt rates pain 5/10 today - says this a pretty good day pain wise  Pt reports Lt leg feels weaker than RLE; says RLE is starting to get weaker based on compensation for LLE weakness; has a lot of nerve pain in LLE Currently not doing any exercises  Per chart note from Dr. Joane on 07-15-23:   Carlethia Mesquita is a 48 y.o. female who presents to [facility] today for  polyarthralgia. L hip replacement in 2019. PCP and rheum dx her w/ fibromyalgia. Now bilat knees hurt, L>R. Hx of arthroscopy in both knees. She notes chronic pain for the past year and a half. Has had to decrease work to half days. She notes over weakness and lack of strength. Pt also c/o L lateral thigh pain w/ radiating pain distally to her L foot.    PERTINENT HISTORY:  Lt hip THR Nov. 2019, migraines, s/p gastric bypass, fibromyalgia, polyarthralgia  PAIN:  Are you having pain? Yes: NPRS scale: 5/10 (today is a good day) Pain location: generalized Pain description: achy, throbbing, arthritic feeling Aggravating factors: moving or lying down; standing for too long period of time Relieving factors: pt has routine - has TENS unit, heating pad, wrist splints, compression gloves for hands and compression stockings  PRECAUTIONS: Other: None  RED FLAGS: None   WEIGHT BEARING RESTRICTIONS: No  FALLS:  Has patient fallen in last 6 months? No and several stumbles - did fall off of bed couple of months ago - was sitting [place] style on bed and lost balance reaching down for something  LIVING ENVIRONMENT: Lives with: lives alone Lives in: Other Duplex Stairs: 1 step only to enter  Has following equipment at home: None  OCCUPATION: works part-time in Engineering geologist  PLOF: Independent  PATIENT GOALS: be able to gain strength; reduce pain  NEXT MD VISIT: 08-19-23 with Dr. Joane:  08-23-23 with Dr. Skeet for Botox  injections  OBJECTIVE:  Note: Objective measures were completed at Evaluation unless otherwise noted.  DIAGNOSTIC FINDINGS:  MRI is scheduled in Oct. - must be standing MRI per pt due to claustrophobia  PATIENT SURVEYS:  Modified Oswestry:  MODIFIED OSWESTRY DISABILITY SCALE  Date: 08-15-23 Score  Pain intensity 3 =  Pain medication provides me with moderate relief from pain.  2. Personal care (washing, dressing, etc.) 2 =  It is painful to take care of myself, and I am slow and  careful.  3. Lifting 4 = I can lift only very light weights  4. Walking 3 =  Pain prevents me from walking more than  mile.  5. Sitting 1 =  I can only sit in my favorite chair as long as I like.  6. Standing 1 =  I can stand as long as I want but, it increases my pain.  7. Sleeping 3 =  Even when I take pain medication, I sleep less than 4 hours.  8. Social Life 3 =  Pain prevents me from going out very often.  9. Traveling 2 =  My pain restricts my travel over 2 hours.  10. Employment/ Homemaking 3 = Pain prevents me from doing anything but light duties.  Total 25/50 ( 50% =severe disability)   Interpretation of scores: Score Category Description  0-20% Minimal Disability The patient can cope with most living activities. Usually no treatment is indicated apart from advice on lifting, sitting and exercise  21-40% Moderate  Disability The patient experiences more pain and difficulty with sitting, lifting and standing. Travel and social life are more difficult and they may be disabled from work. Personal care, sexual activity and sleeping are not grossly affected, and the patient can usually be managed by conservative means  41-60% Severe Disability Pain remains the main problem in this group, but activities of daily living are affected. These patients require a detailed investigation  61-80% Crippled Back pain impinges on all aspects of the patient's life. Positive intervention is required  81-100% Bed-bound  These patients are either bed-bound or exaggerating their symptoms  Bluford FORBES Zoe DELENA Karon DELENA, et al. Surgery versus conservative management of stable thoracolumbar fracture: the PRESTO feasibility RCT. [place] ([place]): [employer]; 2021 Nov. [facility] Technology Assessment, No. 25.62.) Appendix 3, Oswestry Disability Index category descriptors. Available from: FindJewel[employer].cz  Minimally Clinically Important Difference (MCID) =  12.8%  COGNITION: Overall cognitive status: Within functional limits for tasks assessed     SENSATION: Numbness and tingling down Lt lateral thigh and sometimes in her hands when she wakes up   POSTURE: No Significant postural limitations  PALPATION: Tenderness with palpation of spinous process area of approx. T10-T12 vertebral level  LUMBAR ROM: WFL's for all motions  AROM eval  Flexion   Extension   Right lateral flexion   Left lateral flexion   Right rotation   Left rotation    (Blank rows = not tested)  LOWER EXTREMITY ROM:  WNL's bil. LE's    LOWER EXTREMITY MMT:    MMT Right eval Left eval  Hip flexion 5 4 - c/o pain with resistance  Hip extension 5 4  Hip abduction 5 4  Hip adduction    Hip internal rotation    Hip external rotation    Knee flexion 4 c/o pain with resistance 4 c/o pain with resistance  Knee extension 5 5  Ankle dorsiflexion 5 5  Ankle plantarflexion    Ankle inversion    Ankle eversion     (Blank rows = not tested)  LUMBAR SPECIAL TESTS:  N/A  FUNCTIONAL TESTS:  5 times sit to stand: 19.34 secs Timed up and go (TUG): 11.22 secs without device 10 meter walk test: 10.10 secs = 3.25 ft/sec without device  GAIT: Distance walked: 85'  Assistive device utilized: None Level of assistance: Complete Independence Comments: pt reports today is a good day - reports gait pattern changes when pain is increased  Vertigo Assessment: Rt Dix-Hallpike test - pt reported vertigo but no nystagmus noted in test position Lt Dix-Hallpike test (+) for Lt rotary upbeating nystagmus and c/o vertigo in test position  TREATMENT DATE: 08-21-23                                                                                                                               Patient seen for aquatic therapy today.  Treatment took place in water 3.6-4.8 feet deep depending upon  activity.  Pt entered and exited the pool via step negotiation with use of hand rails  modified independently.      Patient seen for aquatic therapy today.  Treatment took place in water 3.6 - 4.8 feet deep depending upon activity.  Pt entered and exited the pool via step negotiation with use of hand rails modified independently.   Warm up: water walking - forwards 4 laps (18' x 8); backwards 2 laps (18' x 4 reps) and sideways 2 laps (18' x 4 reps)   Stretching:  Runner's stretch for hamstring and gastroc stretch - 20 sec hold x 1 rep each leg Heel cord stretch each leg with forefoot on pool wall - 20 sec hold x 1 rep each leg   Core strengthening exercises:  pt stood with feet together - used hand bells for increased resistance with water current to perform shoulder flexion/extension exercise 10 reps with cues to tighten core for improved stabilization: performed horizontal abduction/adduction with hand bells 10 reps x 1 set   Pt performed stepping forward/back with 1 leg with simultaneously pushing barbells forward during the stepping, then stepping back and pulling barbells back 10 reps each leg; stepping sideways and back in with shoulder horizontal abdct./adduction during the stepping - 10 reps each leg: pt then performed stepping backward/forward with pushing barbells forward/ pulling barbells in while stepping back to starting position - cues to tighten core for improved core stabilization   Performed heel raises bil. LE's 10 reps   Balance exercises:  marching in place 10 reps x 2 sets  Standing on 1 leg holding large barbells - made circles CW 10 reps, then CCW 10 reps each leg for improved SLS on each leg  Standing hip flexion, abduction and extension 10 reps each leg with use of aquatic cuff on each leg for increased resistance for strengthening    Pt sat pony style on large yellow noodle - used small barbells to propel self across pool - 3 laps (18' x 6 reps) for improved core stabilization  Pt requires the buoyancy of water for active assisted exercises with  buoyancy supported for strengthening and AROM exercises.  Buoyancy of water provides spinal decompression and joint offloading to allow movement with reduced pain, facilitating strengthening and flexibility exercises with decreased pain compared to performance on land.  Hydrostatic pressure also supports joints by unweighting joint load by at least 50 % in 3-4 feet depth water. 80% in chest to neck deep water. Water will provide assistance with movement using the current and laminar flow while the buoyancy reduces weight bearing. Pt requires the viscosity of the water for resistance with strengthening exercises.  Balance exercises able to be safely performed in aquatic environment without the fall risk with performing same exercises on land.       PATIENT EDUCATION:  Education details: article on BPPV etiology, aquatic info given to pt Person educated: Patient Education method: Chief Technology Officer Education comprehension: verbalized understanding  HOME EXERCISE PROGRAM: To be established  ASSESSMENT:  CLINICAL IMPRESSION: Pt participated in initial aquatic PT session with focus of exercises on LE strengthening and stretching and core stabilization.  Pt was challenged by core stability exercise of stepping in various directions with simultaneous movement of barbells forward/back, requiring SLS balance and core stabilization.  Pt reported pain intensity 5/10 at start of session and 5/10 at end of session, reporting she felt good with no significant fatigue reported.  Pt did have mild tremoring of LUE holding onto  pool edge for stabilization/assist with balance with standing hip exercises but able to complete full set of 10 reps of exercises.  Pt tolerated aquatic exercises well.     OBJECTIVE IMPAIRMENTS: decreased activity tolerance, decreased endurance, difficulty walking, decreased ROM, decreased strength, and pain.   ACTIVITY LIMITATIONS: carrying, lifting, bending, squatting, and  locomotion level  PARTICIPATION LIMITATIONS: cleaning, laundry, shopping, community activity, occupation, and pt does not currently drive  PERSONAL FACTORS: Behavior pattern, Past/current experiences, Time since onset of injury/illness/exacerbation, and 1-2 comorbidities: fibromyalgia and s/p Lt THR are also affecting patient's functional outcome.   REHAB POTENTIAL: Good  CLINICAL DECISION MAKING: Evolving/moderate complexity  EVALUATION COMPLEXITY: Moderate   GOALS: Goals reviewed with patient? Yes  SHORT TERM GOALS: Target date: 09-13-23  Initiate aquatic therapy to assist with pain management.  Baseline:  to be scheduled Goal status: INITIAL  2.  Pt will report reduced back pain to </= 4/10 to increase tolerance and ease with mobility, ADL's and work activities. Baseline: 5/10 on a good day Goal status: INITIAL  3.  Pt will have a (-) Lt Dix-Hallpike test with no nystagmus and no c/o vertigo to indicate resolution of Lt BPPV. Baseline: (+) Lt Dix-Hallpike test on 08-15-23 Goal status: INITIAL  4.  Improve Modified Oswestry questionnaire score to </= 40% to demo improvement in LBP and functional capabilities.  Baseline: 50% (25/50) on 08-15-23 Goal status: INITIAL  5.  Independent in land based HEP for LE and low strengthening & stretching exercises. Baseline: Dependent Goal status: INITIAL   LONG TERM GOALS: Target date: 10-11-23  Pt will report reduced back pain to </= 3/10 to increase tolerance and ease with mobility, ADL's and work activities. Baseline: 5/10 on a good day Goal status: INITIAL  2.  Improve Modified Oswestry questionnaire score to </= 30% to demo improvement in LBP and functional capabilities.  Baseline: 50% (25/50) on 08-15-23 Goal status: INITIAL  3.  Pt will improve 5x sit to stand transfer score to </= 15 secs to demo increased LE strength and reduced back pain with this movement. Baseline: 19.34 secs without UE support from chair Goal status:  INITIAL  4.  Independent in aquatic HEP and updated land based HEP to be continued upon discharge from PT to improve functional mobility and assist with pain management. Baseline: Dependent Goal status: INITIAL  5.  Pt will verbalize understanding of Epley maneuver for self treatment of BPPV if needed pending episodic re-occurrence in future.  Baseline: Dependent Goal status: INITIAL   PLAN:  PT FREQUENCY: 2x/week (1 land/1 aquatic)  PT DURATION: 8 weeks  PLANNED INTERVENTIONS: 97110-Therapeutic exercises, 97530- Therapeutic activity, [id number]- Neuromuscular re-education, 97535- Self Care, [id number]- Manual therapy, [id number]- Gait training, [phone]- Canalith repositioning, [id number]- Aquatic Therapy, and [id number]- Electrical stimulation (manual).  PLAN FOR NEXT SESSION: land - recheck Lt BPPV; begin manual therapy for mid & Low back areas, HEP for stretching  Aquatic - water walking, [place] for pain management , LE strengthening and core stabilization   Journiee Feldkamp, [place], PT 08/22/2023, 9:53 AM   Check all possible CPT codes: 02889- Therapeutic Exercise, [phone]- Neuro Re-education, [phone] - Gait Training, [phone] - Manual Therapy, [phone] - Therapeutic Activities, [phone] - Self Care, [phone] - Electrical stimulation (Manual), [phone] - Aquatic therapy, and [phone] - Canalith Repositioning    Check all conditions that are expected to impact treatment: Musculoskeletal disorders and Psychological or psychiatric disorders   If treatment provided at initial evaluation, no treatment charged due to lack of authorization.

## 2023-08-22 NOTE — Therapy (Signed)
 OUTPATIENT PHYSICAL THERAPY THORACOLUMBAR TREATMENT NOTE   Patient Name: Nickie Warwick MRN: [id number] DOB:May 23, 1975, 48 y.o., female Today's Date: 08/23/2023  END OF SESSION:  PT End of Session - 08/23/23 1545     Visit Number 3    Number of Visits 17   eval + 8 land visits, 8 aquatic visits   Date for PT Re-Evaluation 10/04/23    Authorization Type [employer] - no auth required    Authorization - Visit Number 3    Authorization - Number of Visits 27    PT Start Time 1532    PT Stop Time 1620    PT Time Calculation (min) 48 min    Equipment Utilized During Treatment --    Activity Tolerance Patient tolerated treatment well    Behavior During Therapy WFL for tasks assessed/performed           Past Medical History:  Diagnosis Date   Abnormal Pap smear of cervix    Allergy    Anxiety    Asthma    Bipolar 1 disorder (HCC)    Chronic pain    Depression    Diabetes mellitus without complication (HCC)    Diverticulitis    GERD (gastroesophageal reflux disease)    HPV in female    Hyperlipidemia    Hypertension    Migraines    Osteoarthritis    Ovarian cyst    right   PCOS (polycystic ovarian syndrome)    STD (sexually transmitted disease)    Trichomonas contact, treated    Vertigo    Past Surgical History:  Procedure Laterality Date   BOWEL RESECTION     gasticbypass     GASTRIC BYPASS     KNEE ARTHROSCOPY Bilateral    SHOULDER SURGERY Left    TOTAL HIP ARTHROPLASTY Left    Patient Active Problem List   Diagnosis Date Noted   Meralgia paresthetica of left side 07/15/2023   Fibromyalgia 06/27/2023   Ovarian cyst 02/20/2023   Essential hypertension 02/20/2023   Bipolar 1 disorder, depressed (HCC) 02/16/2022   Paresthesia 09/13/2021   CIN I (cervical intraepithelial neoplasia I) 03/03/2021   LSIL pap smear of cervix/human papillomavirus (HPV) positive 10/24/2020   Family history of early CAD 10/24/2020   B12 deficiency 07/26/2020   Somatic dysfunction  of spine, cervical 07/21/2020   Status post left hip replacement 03/12/2019   T2DM (type 2 diabetes mellitus) (HCC) 07/31/2018   Dyslipidemia associated with type 2 diabetes mellitus (HCC) 07/31/2018   Allergic rhinitis 07/31/2018   Mild intermittent asthma, uncomplicated 07/31/2018   GERD (gastroesophageal reflux disease) 07/31/2018   S/P gastric bypass 07/31/2018   Migraine 07/31/2018   Anxiety 07/31/2018   Depression, major, single episode, complete remission (HCC) 07/31/2018   Insomnia 07/31/2018   Umbilical hernia 07/31/2018   Iron deficiency anemia 06/09/2018   Cervicogenic headache 05/12/2018   Low back pain 05/12/2018   Polyarthralgia 05/12/2018    PCP: Kennyth Bart, MD  REFERRING PROVIDER: Joane Artist RAMAN, MD; Skeet Juliene SAUNDERS, DO  REFERRING DIAG:  Diagnosis  M25.50 (ICD-10-CM) - Polyarthralgia  M79.7 (ICD-10-CM) - Fibromyalgia  R53.1 (ICD-10-CM) - Weakness   H81.13 (ICD-10-CM) - Benign paroxysmal positional vertigo due to bilateral vestibular disorder  Rationale for Evaluation and Treatment: Rehabilitation  THERAPY DIAG:  Other low back pain  Pain in left hip  BPPV (benign paroxysmal positional vertigo), left  ONSET DATE: early 2024 for onset of chronic pain: Referral date 07-15-23  SUBJECTIVE:  SUBJECTIVE STATEMENT:  Pt reports she felt good after aquatic PT yesterday (Wed., the 9th); pt reports she is scheduled for Botox  injection with Dr. Skeet tomorrow, on Friday, for migraines.  Pt requests to work on balance and core strengthening today.  Says today is typical pain intensity 5/10 - says this is a good day; pt says the vertigo has been much improved compared to what it was last week at eval  Per chart note from Dr. Joane on 07-15-23:   Annalyse Langlais is a 47 y.o. female who  presents to [facility] at [facility] today for polyarthralgia. L hip replacement in 2019. PCP and rheum dx her w/ fibromyalgia. Now bilat knees hurt, L>R. Hx of arthroscopy in both knees. She notes chronic pain for the past year and a half. Has had to decrease work to half days. She notes over weakness and lack of strength. Pt also c/o L lateral thigh pain w/ radiating pain distally to her L foot.    PERTINENT HISTORY:  Lt hip THR Nov. 2019, migraines, s/p gastric bypass, fibromyalgia, polyarthralgia  PAIN:  Are you having pain? Yes: NPRS scale: 5/10 (today is a good day) Pain location: generalized Pain description: achy, throbbing, arthritic feeling Aggravating factors: moving or lying down; standing for too long period of time Relieving factors: pt has routine - has TENS unit, heating pad, wrist splints, compression gloves for hands and compression stockings  PRECAUTIONS: Other: None  RED FLAGS: None   WEIGHT BEARING RESTRICTIONS: No  FALLS:  Has patient fallen in last 6 months? No and several stumbles - did fall off of bed couple of months ago - was sitting [place] style on bed and lost balance reaching down for something  LIVING ENVIRONMENT: Lives with: lives alone Lives in: Other Duplex Stairs: 1 step only to enter  Has following equipment at home: None  OCCUPATION: works part-time in Engineering geologist  PLOF: Independent  PATIENT GOALS: be able to gain strength; reduce pain  NEXT MD VISIT: 08-19-23 with Dr. Joane:  08-23-23 with Dr. Skeet for Botox  injections  OBJECTIVE:  Note: Objective measures were completed at Evaluation unless otherwise noted.  DIAGNOSTIC FINDINGS:  MRI is scheduled in Oct. - must be standing MRI per pt due to claustrophobia  PATIENT SURVEYS:  Modified Oswestry:  MODIFIED OSWESTRY DISABILITY SCALE  Date: 08-15-23 Score  Pain intensity 3 =  Pain medication provides me with moderate relief from pain.  2. Personal care (washing, dressing, etc.) 2  =  It is painful to take care of myself, and I am slow and careful.  3. Lifting 4 = I can lift only very light weights  4. Walking 3 =  Pain prevents me from walking more than  mile.  5. Sitting 1 =  I can only sit in my favorite chair as long as I like.  6. Standing 1 =  I can stand as long as I want but, it increases my pain.  7. Sleeping 3 =  Even when I take pain medication, I sleep less than 4 hours.  8. Social Life 3 =  Pain prevents me from going out very often.  9. Traveling 2 =  My pain restricts my travel over 2 hours.  10. Employment/ Homemaking 3 = Pain prevents me from doing anything but light duties.  Total 25/50 ( 50% =severe disability)   Interpretation of scores: Score Category Description  0-20% Minimal Disability The patient can cope with most living activities. Usually no treatment  is indicated apart from advice on lifting, sitting and exercise  21-40% Moderate Disability The patient experiences more pain and difficulty with sitting, lifting and standing. Travel and social life are more difficult and they may be disabled from work. Personal care, sexual activity and sleeping are not grossly affected, and the patient can usually be managed by conservative means  41-60% Severe Disability Pain remains the main problem in this group, but activities of daily living are affected. These patients require a detailed investigation  61-80% Crippled Back pain impinges on all aspects of the patient's life. Positive intervention is required  81-100% Bed-bound  These patients are either bed-bound or exaggerating their symptoms  Bluford FORBES Zoe DELENA Karon DELENA, et al. Surgery versus conservative management of stable thoracolumbar fracture: the PRESTO feasibility RCT. [place] ([place]): [employer]; 2021 Nov. [facility] Technology Assessment, No. 25.62.) Appendix 3, Oswestry Disability Index category descriptors. Available from: FindJewelers.cz  Minimally  Clinically Important Difference (MCID) = 12.8%  COGNITION: Overall cognitive status: Within functional limits for tasks assessed     SENSATION: Numbness and tingling down Lt lateral thigh and sometimes in her hands when she wakes up   POSTURE: No Significant postural limitations  PALPATION: Tenderness with palpation of spinous process area of approx. T10-T12 vertebral level  LUMBAR ROM: WFL's for all motions  AROM eval  Flexion   Extension   Right lateral flexion   Left lateral flexion   Right rotation   Left rotation    (Blank rows = not tested)  LOWER EXTREMITY ROM:  WNL's bil. LE's    LOWER EXTREMITY MMT:    MMT Right eval Left eval  Hip flexion 5 4 - c/o pain with resistance  Hip extension 5 4  Hip abduction 5 4  Hip adduction    Hip internal rotation    Hip external rotation    Knee flexion 4 c/o pain with resistance 4 c/o pain with resistance  Knee extension 5 5  Ankle dorsiflexion 5 5  Ankle plantarflexion    Ankle inversion    Ankle eversion     (Blank rows = not tested)  LUMBAR SPECIAL TESTS:  N/A  FUNCTIONAL TESTS:  5 times sit to stand: 19.34 secs Timed up and go (TUG): 11.22 secs without device 10 meter walk test: 10.10 secs = 3.25 ft/sec without device  GAIT: Distance walked: 71'  Assistive device utilized: None Level of assistance: Complete Independence Comments: pt reports today is a good day - reports gait pattern changes when pain is increased  Vertigo Assessment: Rt Dix-Hallpike test - pt reported vertigo but no nystagmus noted in test position Lt Dix-Hallpike test (+) for Lt rotary upbeating nystagmus and c/o vertigo in test position  TREATMENT DATE: 08-22-23                                                                                                                               NeuroRe-ed:  Windell Rode  BPPV; Lt Dix-Hallpike test (-) with no nystagmus and no c/o vertigo in test position  Balance exercises- standing at  counter - Romberg position with EO - 10 sec hold initially; added horizontal and vertical head turns 5 reps each direction with mild postural instability:  progressed to EC in Romberg position - 10 sec hold initially; added horizontal and vertical head turns with EC with CGA for safety due to moderate postural instability SLS - 10 secs x 2 reps on each leg with UE support;  tandem stance 20 sec hold, 1 rep each foot position (Issued these exercises for HEP)  TherEx: Pelvic tilt 10 reps with 5 sec hold Pelvic tilt - then SLR each leg 10 reps each LE Pelvic tilt - then added circles CW 5 reps each leg for improved core stabilization  Bridging x 5 reps Bridging with LE extension 5 reps each LE;   Bridging with marching 5 reps Bridging with hip abdct/adduction in hooklying position 5 reps  Quadruped - lifting each arm only, lifting each leg only 3 sec hold; progressed to lifting LE/contralateral UE 3 reps each side with attempted 5 sec hold with min assist for balance; improved stability noted with repetition  Pt asked for exercises to assist with diastasis recti, as she stated she had this problem due to gastroc bypass surgery Pt performed abdominal strengthening exercise - lifting shoulders up off mat with hands behind head, elbows abducted - 10 reps - small lift only performed  Pt performed abdominal strengthening exercise - leaning back on pillow on inverted chair on mat - performed 10 reps trunk flexion without UE support for abdominal strengthening  HEP issued: Access Code: [email or web address] URL: [email or web address] Date: 08/23/2023 Prepared by: Rock Kussmaul  Exercises - Standing Hip Flexion with Resistance  - 1 x daily - 7 x weekly - 3 sets - 10 reps - Standing Hip Abduction with Theraband Resistance  - 1 x daily - 7 x weekly - 3 sets - 10 reps - Standing Hip Extension with Resistance  - 1 x daily - 7 x weekly - 3 sets - 10 reps - Hip and Knee Flexion with Anchored Resistance   - 1 x daily - 7 x weekly - 3 sets - 10 reps - Romberg Stance with Eyes Closed  - 1 x daily - 7 x weekly - 1 sets - 2 reps - 15 sec hold - Single Leg Stance with Support  - 1 x daily - 7 x weekly - 1 sets - 2 reps - 10 sec hold - HEEL TO TOE   - 1 x daily - 7 x weekly - 1 sets - 2 reps - 20 sec hold - Bird Dog  - 1 x daily - 7 x weekly - 1 sets - 5 reps - 3-5 sec hold - Pelvic Tilt  - 1 x daily - 7 x weekly - 1 sets - 10 reps - 3-5 sec hold - Supine Single Knee to Chest  - 1 x daily - 7 x weekly - 1 sets - 1-2 reps - 20 sec hold - Supine Double Knee to Chest  - 1 x daily - 7 x weekly - 1 sets - 1-2 reps - 20 sec hold   PATIENT EDUCATION:  Education details: HEP Medbridge [phone] Person educated: Patient Education method: Explanation and Handouts Education comprehension: verbalized understanding  HOME EXERCISE PROGRAM: To be established  ASSESSMENT:  CLINICAL IMPRESSION: PT session focused on establishing HEP for balance (pt's request due to  deficits noted in aquatic PT session on 08-21-23), lumbar stretching and strengthening and core stabilization.  Pt noted to have decreased vestibular input in maintaining balance as evidenced by moderate postural instability with standing on floor with feet together with EC and with head turns.  Pt also noted to have decreased core stabilization/trunk strength as pt was moderately challenged with maintaining balance in quadruped position with bird dog exercise.  Pt reported pain intensity remained constant at 5/10 intensity during today's session.  Pt had (-) Lt Dix-Hallpike test, indicative of resolution of Lt BPPV posterior canalithiasis.  Pt reported no spinning vertigo since treatment with Epley maneuver during eval last week.  Cont with POC.    OBJECTIVE IMPAIRMENTS: decreased activity tolerance, decreased endurance, difficulty walking, decreased ROM, decreased strength, and pain.   ACTIVITY LIMITATIONS: carrying, lifting, bending, squatting, and  locomotion level  PARTICIPATION LIMITATIONS: cleaning, laundry, shopping, community activity, occupation, and pt does not currently drive  PERSONAL FACTORS: Behavior pattern, Past/current experiences, Time since onset of injury/illness/exacerbation, and 1-2 comorbidities: fibromyalgia and s/p Lt THR are also affecting patient's functional outcome.   REHAB POTENTIAL: Good  CLINICAL DECISION MAKING: Evolving/moderate complexity  EVALUATION COMPLEXITY: Moderate   GOALS: Goals reviewed with patient? Yes  SHORT TERM GOALS: Target date: 09-13-23  Initiate aquatic therapy to assist with pain management.  Baseline:  to be scheduled Goal status: INITIAL  2.  Pt will report reduced back pain to </= 4/10 to increase tolerance and ease with mobility, ADL's and work activities. Baseline: 5/10 on a good day Goal status: INITIAL  3.  Pt will have a (-) Lt Dix-Hallpike test with no nystagmus and no c/o vertigo to indicate resolution of Lt BPPV. Baseline: (+) Lt Dix-Hallpike test on 08-15-23 Goal status: INITIAL  4.  Improve Modified Oswestry questionnaire score to </= 40% to demo improvement in LBP and functional capabilities.  Baseline: 50% (25/50) on 08-15-23 Goal status: INITIAL  5.  Independent in land based HEP for LE and low strengthening & stretching exercises. Baseline: Dependent Goal status: INITIAL   LONG TERM GOALS: Target date: 10-11-23  Pt will report reduced back pain to </= 3/10 to increase tolerance and ease with mobility, ADL's and work activities. Baseline: 5/10 on a good day Goal status: INITIAL  2.  Improve Modified Oswestry questionnaire score to </= 30% to demo improvement in LBP and functional capabilities.  Baseline: 50% (25/50) on 08-15-23 Goal status: INITIAL  3.  Pt will improve 5x sit to stand transfer score to </= 15 secs to demo increased LE strength and reduced back pain with this movement. Baseline: 19.34 secs without UE support from chair Goal status:  INITIAL  4.  Independent in aquatic HEP and updated land based HEP to be continued upon discharge from PT to improve functional mobility and assist with pain management. Baseline: Dependent Goal status: INITIAL  5.  Pt will verbalize understanding of Epley maneuver for self treatment of BPPV if needed pending episodic re-occurrence in future.  Baseline: Dependent Goal status: INITIAL   PLAN:  PT FREQUENCY: 2x/week (1 land/1 aquatic)  PT DURATION: 8 weeks  PLANNED INTERVENTIONS: 97110-Therapeutic exercises, 97530- Therapeutic activity, [id number]- Neuromuscular re-education, 97535- Self Care, 02859- Manual therapy, [id number]- Gait training, [phone]- Canalith repositioning, [id number]- Aquatic Therapy, and [id number]- Electrical stimulation (manual).  PLAN FOR NEXT SESSION: land - any questions/problems with HEP issued on 08-22-23:  pt requesting exercises to help with diastasis recti - please modify prn what I gave her (small range crunches);  any  exercise/activity to work on core stabilization; cervical stretches if needed as pt has chronic, generalized pain  (thank you for seeing her!)  Aquatic - water walking, [place] for pain management , LE strengthening and core stabilization   Rayman Petrosian, [place], PT 08/23/2023, 3:52 PM   Check all possible CPT codes: 02889- Therapeutic Exercise, [phone]- Neuro Re-education, [phone] - Gait Training, [phone] - Manual Therapy, [phone] - Therapeutic Activities, [phone] - Self Care, [phone] - Electrical stimulation (Manual), [phone] - Aquatic therapy, and [phone] - Canalith Repositioning    Check all conditions that are expected to impact treatment: Musculoskeletal disorders and Psychological or psychiatric disorders   If treatment provided at initial evaluation, no treatment charged due to lack of authorization.

## 2023-08-23 ENCOUNTER — Encounter: Payer: Self-pay | Admitting: Physical Therapy

## 2023-08-23 ENCOUNTER — Encounter: Payer: Self-pay | Admitting: Family Medicine

## 2023-08-23 ENCOUNTER — Ambulatory Visit: Admitting: Neurology

## 2023-08-23 DIAGNOSIS — G43709 Chronic migraine without aura, not intractable, without status migrainosus: Secondary | ICD-10-CM | POA: Diagnosis not present

## 2023-08-23 MED ORDER — ONABOTULINUMTOXINA 100 UNITS IJ SOLR
200.0000 [IU] | Freq: Once | INTRAMUSCULAR | Status: AC
  Administered 2023-08-23: 155 [IU] via INTRAMUSCULAR

## 2023-08-23 MED ORDER — METHOCARBAMOL 750 MG PO TABS: 750.0000 mg | ORAL_TABLET | Freq: Three times a day (TID) | ORAL | 5 refills | Status: DC | PRN

## 2023-08-23 NOTE — Telephone Encounter (Signed)
Last refill by historical provider  Please advise

## 2023-08-23 NOTE — Progress Notes (Signed)

## 2023-08-25 ENCOUNTER — Encounter: Payer: Self-pay | Admitting: Family Medicine

## 2023-08-26 ENCOUNTER — Other Ambulatory Visit (HOSPITAL_COMMUNITY): Payer: Self-pay | Admitting: Family Medicine

## 2023-08-26 DIAGNOSIS — M546 Pain in thoracic spine: Secondary | ICD-10-CM

## 2023-08-26 NOTE — Telephone Encounter (Signed)
 See note

## 2023-08-27 ENCOUNTER — Other Ambulatory Visit: Payer: Self-pay | Admitting: *Deleted

## 2023-08-27 MED ORDER — PROMETHAZINE HCL 25 MG PO TABS: 25.0000 mg | ORAL_TABLET | Freq: Three times a day (TID) | ORAL | 0 refills | Status: DC | PRN

## 2023-08-27 NOTE — Telephone Encounter (Signed)
 Okay to send in promethazine  25 mg every 8 hours as needed for nausea.

## 2023-08-27 NOTE — Telephone Encounter (Signed)
 Rx send to [facility] pharmacy

## 2023-08-28 DIAGNOSIS — F3131 Bipolar disorder, current episode depressed, mild: Secondary | ICD-10-CM | POA: Diagnosis not present

## 2023-08-28 DIAGNOSIS — F4310 Post-traumatic stress disorder, unspecified: Secondary | ICD-10-CM | POA: Diagnosis not present

## 2023-08-28 DIAGNOSIS — F410 Panic disorder [episodic paroxysmal anxiety] without agoraphobia: Secondary | ICD-10-CM | POA: Diagnosis not present

## 2023-08-28 DIAGNOSIS — F411 Generalized anxiety disorder: Secondary | ICD-10-CM | POA: Diagnosis not present

## 2023-08-29 DIAGNOSIS — F411 Generalized anxiety disorder: Secondary | ICD-10-CM | POA: Diagnosis not present

## 2023-08-30 ENCOUNTER — Ambulatory Visit: Payer: Self-pay | Admitting: Physical Therapy

## 2023-09-05 ENCOUNTER — Ambulatory Visit: Admitting: Physical Therapy

## 2023-09-06 DIAGNOSIS — F4310 Post-traumatic stress disorder, unspecified: Secondary | ICD-10-CM | POA: Diagnosis not present

## 2023-09-12 ENCOUNTER — Ambulatory Visit: Payer: Self-pay | Admitting: Physical Therapy

## 2023-09-12 ENCOUNTER — Telehealth

## 2023-09-12 DIAGNOSIS — M546 Pain in thoracic spine: Secondary | ICD-10-CM

## 2023-09-12 DIAGNOSIS — M25552 Pain in left hip: Secondary | ICD-10-CM | POA: Diagnosis not present

## 2023-09-12 DIAGNOSIS — M5459 Other low back pain: Secondary | ICD-10-CM

## 2023-09-12 DIAGNOSIS — H8111 Benign paroxysmal vertigo, right ear: Secondary | ICD-10-CM

## 2023-09-12 NOTE — Therapy (Signed)
 OUTPATIENT PHYSICAL THERAPY THORACOLUMBAR TREATMENT NOTE   Patient Name: Heidi Holland MRN: [id number] DOB:06/26/1975, 48 y.o., female Today's Date: 09/15/2023  END OF SESSION:  PT End of Session - 09/15/23 1451     Visit Number 4    Number of Visits 17   eval + 8 land visits, 8 aquatic visits   Date for PT Re-Evaluation 10/04/23    Authorization Type [employer] - no auth required    Authorization - Visit Number 4    Authorization - Number of Visits 27    PT Start Time 1450    PT Stop Time 1535    PT Time Calculation (min) 45 min    Activity Tolerance Patient tolerated treatment well    Behavior During Therapy WFL for tasks assessed/performed            Past Medical History:  Diagnosis Date   Abnormal Pap smear of cervix    Allergy    Anxiety    Asthma    Bipolar 1 disorder (HCC)    Chronic pain    Depression    Diabetes mellitus without complication (HCC)    Diverticulitis    GERD (gastroesophageal reflux disease)    HPV in female    Hyperlipidemia    Hypertension    Migraines    Osteoarthritis    Ovarian cyst    right   PCOS (polycystic ovarian syndrome)    STD (sexually transmitted disease)    Trichomonas contact, treated    Vertigo    Past Surgical History:  Procedure Laterality Date   BOWEL RESECTION     gasticbypass     GASTRIC BYPASS  2016   KNEE ARTHROSCOPY Bilateral    SHOULDER SURGERY Left    TOTAL HIP ARTHROPLASTY Left    Patient Active Problem List   Diagnosis Date Noted   Meralgia paresthetica of left side 07/15/2023   Fibromyalgia 06/27/2023   Ovarian cyst 02/20/2023   Essential hypertension 02/20/2023   Bipolar 1 disorder, depressed (HCC) 02/16/2022   Paresthesia 09/13/2021   CIN I (cervical intraepithelial neoplasia I) 03/03/2021   LSIL pap smear of cervix/human papillomavirus (HPV) positive 10/24/2020   Family history of early CAD 10/24/2020   B12 deficiency 07/26/2020   Somatic dysfunction of spine, cervical 07/21/2020    Status post left hip replacement 03/12/2019   T2DM (type 2 diabetes mellitus) (HCC) 07/31/2018   Dyslipidemia associated with type 2 diabetes mellitus (HCC) 07/31/2018   Allergic rhinitis 07/31/2018   Mild intermittent asthma, uncomplicated 07/31/2018   GERD (gastroesophageal reflux disease) 07/31/2018   S/P gastric bypass 07/31/2018   Migraine 07/31/2018   Anxiety 07/31/2018   Depression, major, single episode, complete remission (HCC) 07/31/2018   Insomnia 07/31/2018   Umbilical hernia 07/31/2018   Iron deficiency anemia 06/09/2018   Cervicogenic headache 05/12/2018   Low back pain 05/12/2018   Polyarthralgia 05/12/2018    PCP: Kennyth Bart, MD  REFERRING PROVIDER: Joane Artist RAMAN, MD; Skeet Juliene SAUNDERS, DO  REFERRING DIAG:  Diagnosis  M25.50 (ICD-10-CM) - Polyarthralgia  M79.7 (ICD-10-CM) - Fibromyalgia  R53.1 (ICD-10-CM) - Weakness   H81.13 (ICD-10-CM) - Benign paroxysmal positional vertigo due to bilateral vestibular disorder  Rationale for Evaluation and Treatment: Rehabilitation  THERAPY DIAG:  Other low back pain  Pain in thoracic spine  BPPV (benign paroxysmal positional vertigo), right  ONSET DATE: early 2024 for onset of chronic pain: Referral date 07-15-23  SUBJECTIVE:  SUBJECTIVE STATEMENT:  Pt reports she has been really dizzy for the past couple of days - doesn't know why - states it comes and goes  Per chart note from Dr. Joane on 07-15-23:   Kiarra Kidd is a 48 y.o. female who presents to [facility] today for polyarthralgia. L hip replacement in 2019. PCP and rheum dx her w/ fibromyalgia. Now bilat knees hurt, L>R. Hx of arthroscopy in both knees. She notes chronic pain for the past year and a half. Has had to decrease work to half days. She notes  over weakness and lack of strength. Pt also c/o L lateral thigh pain w/ radiating pain distally to her L foot.    PERTINENT HISTORY:  Lt hip THR Nov. 2019, migraines, s/p gastric bypass, fibromyalgia, polyarthralgia  PAIN:  Are you having pain? Yes: NPRS scale: 6/10 (today is a good day) Pain location: generalized Pain description: achy, throbbing, arthritic feeling Aggravating factors: moving or lying down; standing for too long period of time Relieving factors: pt has routine - has TENS unit, heating pad, wrist splints, compression gloves for hands and compression stockings  PRECAUTIONS: Other: None  RED FLAGS: None   WEIGHT BEARING RESTRICTIONS: No  FALLS:  Has patient fallen in last 6 months? No and several stumbles - did fall off of bed couple of months ago - was sitting [place] style on bed and lost balance reaching down for something  LIVING ENVIRONMENT: Lives with: lives alone Lives in: Other Duplex Stairs: 1 step only to enter  Has following equipment at home: None  OCCUPATION: works part-time in Engineering geologist  PLOF: Independent  PATIENT GOALS: be able to gain strength; reduce pain  NEXT MD VISIT: 08-19-23 with Dr. Joane:  08-23-23 with Dr. Skeet for Botox  injections  OBJECTIVE:  Note: Objective measures were completed at Evaluation unless otherwise noted.  DIAGNOSTIC FINDINGS:  MRI is scheduled in Oct. - must be standing MRI per pt due to claustrophobia  PATIENT SURVEYS:  Modified Oswestry:  MODIFIED OSWESTRY DISABILITY SCALE  Date: 08-15-23 Score  Pain intensity 3 =  Pain medication provides me with moderate relief from pain.  2. Personal care (washing, dressing, etc.) 2 =  It is painful to take care of myself, and I am slow and careful.  3. Lifting 4 = I can lift only very light weights  4. Walking 3 =  Pain prevents me from walking more than  mile.  5. Sitting 1 =  I can only sit in my favorite chair as long as I like.  6. Standing 1 =  I can stand as long as I  want but, it increases my pain.  7. Sleeping 3 =  Even when I take pain medication, I sleep less than 4 hours.  8. Social Life 3 =  Pain prevents me from going out very often.  9. Traveling 2 =  My pain restricts my travel over 2 hours.  10. Employment/ Homemaking 3 = Pain prevents me from doing anything but light duties.  Total 25/50 ( 50% =severe disability)   Interpretation of scores: Score Category Description  0-20% Minimal Disability The patient can cope with most living activities. Usually no treatment is indicated apart from advice on lifting, sitting and exercise  21-40% Moderate Disability The patient experiences more pain and difficulty with sitting, lifting and standing. Travel and social life are more difficult and they may be disabled from work. Personal care, sexual activity and sleeping are not  grossly affected, and the patient can usually be managed by conservative means  41-60% Severe Disability Pain remains the main problem in this group, but activities of daily living are affected. These patients require a detailed investigation  61-80% Crippled Back pain impinges on all aspects of the patient's life. Positive intervention is required  81-100% Bed-bound  These patients are either bed-bound or exaggerating their symptoms  Bluford FORBES Zoe DELENA Karon DELENA, et al. Surgery versus conservative management of stable thoracolumbar fracture: the PRESTO feasibility RCT. [place] ([place]): [employer]; 2021 Nov. [facility] Technology Assessment, No. 25.62.) Appendix 3, Oswestry Disability Index category descriptors. Available from: FindJewelers.cz  Minimally Clinically Important Difference (MCID) = 12.8%  COGNITION: Overall cognitive status: Within functional limits for tasks assessed     SENSATION: Numbness and tingling down Lt lateral thigh and sometimes in her hands when she wakes up   POSTURE: No Significant postural  limitations  PALPATION: Tenderness with palpation of spinous process area of approx. T10-T12 vertebral level  LUMBAR ROM: WFL's for all motions  AROM eval  Flexion   Extension   Right lateral flexion   Left lateral flexion   Right rotation   Left rotation    (Blank rows = not tested)  LOWER EXTREMITY ROM:  WNL's bil. LE's    LOWER EXTREMITY MMT:    MMT Right eval Left eval  Hip flexion 5 4 - c/o pain with resistance  Hip extension 5 4  Hip abduction 5 4  Hip adduction    Hip internal rotation    Hip external rotation    Knee flexion 4 c/o pain with resistance 4 c/o pain with resistance  Knee extension 5 5  Ankle dorsiflexion 5 5  Ankle plantarflexion    Ankle inversion    Ankle eversion     (Blank rows = not tested)  LUMBAR SPECIAL TESTS:  N/A  FUNCTIONAL TESTS:  5 times sit to stand: 19.34 secs Timed up and go (TUG): 11.22 secs without device 10 meter walk test: 10.10 secs = 3.25 ft/sec without device  GAIT: Distance walked: 58'  Assistive device utilized: None Level of assistance: Complete Independence Comments: pt reports today is a good day - reports gait pattern changes when pain is increased  Vertigo Assessment: Rt Dix-Hallpike test - pt reported vertigo but no nystagmus noted in test position Lt Dix-Hallpike test (+) for Lt rotary upbeating nystagmus and c/o vertigo in test position  TREATMENT DATE: 09-12-23                                                                                                                               Canalith Repositioning:  Reassessed Lt BPPV; Lt Dix-Hallpike test (-) with no nystagmus and no c/o vertigo in test position Rt Dix-Hallpike test (+) with Rt rotary upbeating nystagmus and c/o vertigo in test position  Canalith Repositioning Maneuver: Epley maneuver for Rt BPPV posterior canalithiasis - 3 reps performed; symptoms improved  on each subsequent rep of Epley with symptoms appearing to be resolved on 3rd  rep  TherEx: Pelvic tilt 10 reps with 5 sec hold Pelvic tilt - then SLR each leg 10 reps each LE Bridging x 5 reps Bridging with LE extension 5 reps each LE;   Bridging with marching 5 reps Bridging with hip abdct/adduction in hooklying position 5 reps  Prone plank - modified position for ease - pt on forearms and on knees - performed 2 reps - held 10 secs each   Quadruped - lifting each arm only, lifting each leg only 3 sec hold; progressed to lifting LE/contralateral UE 3 reps each side with attempted 5 sec hold with min assist for balance; improved stability noted with repetition   Access Code: [email or web address] URL: [email or web address] Date: 08/23/2023 Prepared by: Rock Kussmaul  Exercises - Standing Hip Flexion with Resistance  - 1 x daily - 7 x weekly - 3 sets - 10 reps - Standing Hip Abduction with Theraband Resistance  - 1 x daily - 7 x weekly - 3 sets - 10 reps - Standing Hip Extension with Resistance  - 1 x daily - 7 x weekly - 3 sets - 10 reps - Hip and Knee Flexion with Anchored Resistance  - 1 x daily - 7 x weekly - 3 sets - 10 reps - Romberg Stance with Eyes Closed  - 1 x daily - 7 x weekly - 1 sets - 2 reps - 15 sec hold - Single Leg Stance with Support  - 1 x daily - 7 x weekly - 1 sets - 2 reps - 10 sec hold - HEEL TO TOE   - 1 x daily - 7 x weekly - 1 sets - 2 reps - 20 sec hold - Bird Dog  - 1 x daily - 7 x weekly - 1 sets - 5 reps - 3-5 sec hold - Pelvic Tilt  - 1 x daily - 7 x weekly - 1 sets - 10 reps - 3-5 sec hold - Supine Single Knee to Chest  - 1 x daily - 7 x weekly - 1 sets - 1-2 reps - 20 sec hold - Supine Double Knee to Chest  - 1 x daily - 7 x weekly - 1 sets - 1-2 reps - 20 sec hold   PATIENT EDUCATION:  Education details: HEP Medbridge [phone] Person educated: Patient Education method: Explanation and Handouts Education comprehension: verbalized understanding  HOME EXERCISE PROGRAM: To be established  ASSESSMENT:  CLINICAL  IMPRESSION: Pt has met STG's #1 & 3:  STG's #2, 4 & 5 are in progress as not fully met at today's session:  pt has only attended 3 PT sessions (1 aquatic and 2 land visits) since initial eval on 08-15-23.  Pt had (+) Rt Dix-Hallpike test with Rt rotary upbeating nystagmus, indicative of Rt BPPV posterior canalithiasis.  Symptoms improved on each subsequent rep of Epley maneuver with almost full resolution noted on 3rd rep.  Will continue to assess and treat prn.  Remainder of session focused on low back musc. Strengthening and stretching.  Pt able to perform modified plank x 3 reps with 10 sec hold for core strengthening.  Pt tolerated exercises well.  Cont with POC.    OBJECTIVE IMPAIRMENTS: decreased activity tolerance, decreased endurance, difficulty walking, decreased ROM, decreased strength, and pain.   ACTIVITY LIMITATIONS: carrying, lifting, bending, squatting, and locomotion level  PARTICIPATION LIMITATIONS: cleaning, laundry, shopping, community activity, occupation, and pt does not currently  drive  PERSONAL FACTORS: Behavior pattern, Past/current experiences, Time since onset of injury/illness/exacerbation, and 1-2 comorbidities: fibromyalgia and s/p Lt THR are also affecting patient's functional outcome.   REHAB POTENTIAL: Good  CLINICAL DECISION MAKING: Evolving/moderate complexity  EVALUATION COMPLEXITY: Moderate   GOALS: Goals reviewed with patient? Yes  SHORT TERM GOALS: Target date: 09-13-23  Initiate aquatic therapy to assist with pain management.  Baseline:  to be scheduled Goal status: Goal met 08-21-23  2.  Pt will report reduced back pain to </= 4/10 to increase tolerance and ease with mobility, ADL's and work activities. Baseline: 5/10 on a good day Goal status: IN PROGRESS 09-12-23  3.  Pt will have a (-) Lt Dix-Hallpike test with no nystagmus and no c/o vertigo to indicate resolution of Lt BPPV. Baseline: (+) Lt Dix-Hallpike test on 08-15-23 Goal status: Goal met  09-12-23  4.  Improve Modified Oswestry questionnaire score to </= 40% to demo improvement in LBP and functional capabilities.  Baseline: 50% (25/50) on 08-15-23 Goal status: IN PROGRESS 09-12-23  5.  Independent in land based HEP for LE and low strengthening & stretching exercises. Baseline: Dependent Goal status: IN PROGRESS 09-12-23   LONG TERM GOALS: Target date: 10-11-23  Pt will report reduced back pain to </= 3/10 to increase tolerance and ease with mobility, ADL's and work activities. Baseline: 5/10 on a good day Goal status: INITIAL  2.  Improve Modified Oswestry questionnaire score to </= 30% to demo improvement in LBP and functional capabilities.  Baseline: 50% (25/50) on 08-15-23 Goal status: INITIAL  3.  Pt will improve 5x sit to stand transfer score to </= 15 secs to demo increased LE strength and reduced back pain with this movement. Baseline: 19.34 secs without UE support from chair Goal status: INITIAL  4.  Independent in aquatic HEP and updated land based HEP to be continued upon discharge from PT to improve functional mobility and assist with pain management. Baseline: Dependent Goal status: INITIAL  5.  Pt will verbalize understanding of Epley maneuver for self treatment of BPPV if needed pending episodic re-occurrence in future.  Baseline: Dependent Goal status: INITIAL   PLAN:  PT FREQUENCY: 2x/week (1 land/1 aquatic)  PT DURATION: 8 weeks  PLANNED INTERVENTIONS: 97110-Therapeutic exercises, 97530- Therapeutic activity, [id number]- Neuromuscular re-education, 97535- Self Care, 02859- Manual therapy, [id number]- Gait training, [phone]- Canalith repositioning, [id number]- Aquatic Therapy, and [id number]- Electrical stimulation (manual).  PLAN FOR NEXT SESSION: land - any questions/problems with HEP issued on 08-22-23:  pt requesting exercises to help with diastasis recti - please modify prn what I gave her (small range crunches);  any exercise/activity to work on core stabilization;  cervical stretches if needed as pt has chronic, generalized pain  (thank you for seeing her!)  Aquatic - water walking, [place] for pain management , LE strengthening and core stabilization   Hoyle Barkdull, [place], PT 09/15/2023, 2:55 PM   Check all possible CPT codes: 02889- Therapeutic Exercise, [phone]- Neuro Re-education, [id number] - Gait Training, [id number] - Manual Therapy, 97530 - Therapeutic Activities, [phone] - Self Care, [phone] - Electrical stimulation (Manual), [id number] - Aquatic therapy, and [phone] - Canalith Repositioning    Check all conditions that are expected to impact treatment: Musculoskeletal disorders and Psychological or psychiatric disorders   If treatment provided at initial evaluation, no treatment charged due to lack of authorization.

## 2023-09-13 ENCOUNTER — Ambulatory Visit (HOSPITAL_BASED_OUTPATIENT_CLINIC_OR_DEPARTMENT_OTHER): Admitting: Primary Care

## 2023-09-13 ENCOUNTER — Ambulatory Visit: Admitting: Physical Therapy

## 2023-09-13 ENCOUNTER — Encounter (HOSPITAL_BASED_OUTPATIENT_CLINIC_OR_DEPARTMENT_OTHER): Payer: Self-pay

## 2023-09-13 ENCOUNTER — Encounter (HOSPITAL_BASED_OUTPATIENT_CLINIC_OR_DEPARTMENT_OTHER): Payer: Self-pay | Admitting: Primary Care

## 2023-09-13 VITALS — BP 119/79 | HR 78 | Ht 62.0 in | Wt 177.9 lb

## 2023-09-13 DIAGNOSIS — J0101 Acute recurrent maxillary sinusitis: Secondary | ICD-10-CM

## 2023-09-13 DIAGNOSIS — Z8669 Personal history of other diseases of the nervous system and sense organs: Secondary | ICD-10-CM

## 2023-09-13 MED ORDER — PREDNISONE 10 MG PO TABS
ORAL_TABLET | ORAL | 0 refills | Status: DC
Start: 2023-09-13 — End: 2023-10-16

## 2023-09-13 MED ORDER — FLUCONAZOLE 100 MG PO TABS
ORAL_TABLET | ORAL | 0 refills | Status: DC
Start: 2023-09-13 — End: 2023-10-16

## 2023-09-13 MED ORDER — AMOXICILLIN-POT CLAVULANATE 875-125 MG PO TABS: 1.0000 | ORAL_TABLET | Freq: Two times a day (BID) | ORAL | 0 refills | Status: DC

## 2023-09-13 NOTE — Progress Notes (Signed)
 Epworth Sleepiness Scale  Use the following scale to choose the most appropriate number for each situation. 0 Would never nod off 1  Slight  chance of nodding off 2 Moderate chance of nodding off 3 High chance of nodding off  Sitting and reading: 1 Watching TV: 0 Sitting, inactive, in a public place (e.g., in a meeting, theater, or dinner event): 0 As a passenger in a car for an hour or more without stopping for a break: 1 Lying down to rest when circumstances permit:0 Sitting and talking to someone: 0 Sitting quietly after a meal without alcohol: 2 In a car, while stopped for a few  minutes in traffic or at a light: 0  TOTOAL: 4

## 2023-09-13 NOTE — Patient Instructions (Addendum)
  VISIT SUMMARY: Today, you were seen for a sleep consultation due to concerns about the recurrence of your sleep apnea and ongoing issues with insomnia. You have a history of sleep apnea diagnosed in 2016, which you managed with a CPAP machine for about a year before discontinuing it. Since then, you have experienced constant pain and fatigue, and your sister has observed you snoring, raising concerns that your sleep apnea may have returned. You also have chronic insomnia, which you manage with Ambien . Additionally, you have been working on weight loss and have lost 80 pounds since your original sleep study.  YOUR PLAN: -OBSTRUCTIVE SLEEP APNEA: Obstructive sleep apnea is a condition where your airway becomes blocked during sleep, causing breathing pauses. We will order a home sleep study to evaluate if your sleep apnea has returned. Depending on the results, we may discuss resuming CPAP therapy or consider an oral appliance if CPAP is not tolerated. Surgical options will be avoided until you reach your weight loss goals.  -INSOMNIA: Insomnia is difficulty falling or staying asleep. You are currently managing this with 10 mg of Ambien , which helps you fall asleep within 30-45 minutes. We will continue to monitor this condition as it may contribute to your overall fatigue and daytime dysfunction.  -OBESITY: Obesity is a condition characterized by excessive body weight. You are managing this with Mounjaro , which has helped you lose 80 pounds. Continued weight loss is expected to potentially improve your sleep apnea symptoms.  INSTRUCTIONS: Please complete the home sleep study as ordered. Follow up with us  to discuss the results and next steps. Continue taking Ambien  as prescribed by your psychiatrist for insomnia. Keep working towards your weight loss goals with Mounjaro . If you have any new symptoms or concerns, please contact our office.  Rx: Augmentin  1 tab twice daily x 7 days Prednisone  20mg  daily  x 7 days  Follow-up Please send mychart message or call 3 weeks after completing sleep study for results and treatment options if needed

## 2023-09-13 NOTE — Telephone Encounter (Signed)
 Sent in diflucan

## 2023-09-13 NOTE — Progress Notes (Signed)
 @Patient  ID: Heidi Holland, female    DOB: 12/25/1975, 48 y.o.   MRN: [id number]  Chief Complaint  Patient presents with   Establish Care    Sleep consult    Referring provider: Kennyth Worth HERO, MD  HPI: 48 year old female, never smoked. PMH significant for HTN, mild intermittent asthma, hx sleep apnea, migraine headache, GERD.   09/13/2023 Discussed the use of AI scribe software for clinical note transcription with the patient, who gave verbal consent to proceed.  History of Present Illness Heidi Holland is a 48 year old female with sleep apnea who presents for a sleep consult.  She has a history of sleep apnea diagnosed in 2016 prior to her gastric bypass surgery. She used a CPAP machine for about a year but discontinued it, believing she was better. She has since experienced constant pain and fatigue, raising concerns that her sleep apnea may have returned, contributing to her symptoms. She does not notice waking up gasping or choking, but her sister has observed her snoring and suspects she still has sleep apnea. She lives alone, so she lacks regular observation of her sleep patterns.  She experiences insomnia and cannot sleep without medication. She takes 10 mg of Ambien , prescribed by her psychiatrist, which helps her fall asleep in 30 to 45 minutes. Without it, she is unable to sleep. Her bedtime is between 9 and 11 PM, and she wakes up twice a night, starting her day between 7 and 9 AM.  Since her original sleep study, she has lost approximately 80 pounds and is currently on Mounjaro , 10 mg, for weight loss. She is not yet at her weight loss goal and continues to lose weight. She previously used nasal pillows with her CPAP due to claustrophobia and found the machine difficult to tolerate due to tossing and turning from pain.  Patient also reports sinus pain and pressure with associated post nasal drip and headaches for the last 7 days. She is getting up occasional thick yellow mucus.  She uses Astelin  nasal spray as directed. She has a hx recurrent sinus infections, last one being 3-4 months ago.  Allergies  Allergen Reactions   Ciprofloxacin Itching   Wound Dressing Adhesive     Immunization History  Administered Date(s) Administered   Influenza,inj,Quad PF,6+ Mos 12/15/2019   PFIZER(Purple Top)SARS-COV-2 Vaccination 04/25/2019, 05/16/2019, 12/15/2019    Past Medical History:  Diagnosis Date   Abnormal Pap smear of cervix    Allergy    Anxiety    Asthma    Bipolar 1 disorder (HCC)    Chronic pain    Depression    Diabetes mellitus without complication (HCC)    Diverticulitis    GERD (gastroesophageal reflux disease)    HPV in female    Hyperlipidemia    Hypertension    Migraines    Osteoarthritis    Ovarian cyst    right   PCOS (polycystic ovarian syndrome)    STD (sexually transmitted disease)    Trichomonas contact, treated    Vertigo     Tobacco History: Social History   Tobacco Use  Smoking Status Never  Smokeless Tobacco Never   Counseling given: Not Answered   Outpatient Medications Prior to Visit  Medication Sig Dispense Refill   AUVELITY 45-105 MG TBCR Take by mouth.     azelastine  (ASTELIN ) 0.1 % nasal spray Place 2 sprays into both nostrils 2 (two) times daily. 30 mL 12   botulinum toxin Type A  (BOTOX ) 200 units  injection Inject 155 units IM into multiple site in the face,neck and head once every 90 days 1 each 4   clonazePAM  (KLONOPIN ) 0.5 MG tablet Take 1 tablet (0.5 mg total) by mouth 2 (two) times daily as needed. for anxiety 60 tablet 0   Continuous Glucose Sensor (FREESTYLE LIBRE 3 PLUS SENSOR) MISC Change sensor every 15 days. 2 each 3   HYDROcodone -acetaminophen  (NORCO) 10-325 MG tablet Take 1 tablet by mouth every 6 (six) hours as needed.     losartan  (COZAAR ) 50 MG tablet Take 1 tablet (50 mg total) by mouth daily. 90 tablet 1   lumateperone tosylate (CAPLYTA) 42 MG capsule      methocarbamol  (ROBAXIN ) 750 MG tablet  Take 1 tablet (750 mg total) by mouth 3 (three) times daily as needed. 90 tablet 5   ondansetron  (ZOFRAN -ODT) 4 MG disintegrating tablet TAKE 1 TABLET (4 MG TOTAL) BY MOUTH EVERY 6 (SIX) HOURS AS NEEDED FOR NAUSEA OR VOMITING 18 tablet 11   pantoprazole  (PROTONIX ) 40 MG tablet Take 1 tablet (40 mg total) by mouth daily. 90 tablet 1   pregabalin  (LYRICA ) 150 MG capsule TAKE 1 CAPSULE BY MOUTH TWICE A DAY 60 capsule 5   promethazine  (PHENERGAN ) 25 MG tablet Take 1 tablet (25 mg total) by mouth every 8 (eight) hours as needed for nausea or vomiting. 20 tablet 0   QUEtiapine (SEROQUEL) 200 MG tablet Take 2 tablets by mouth at bedtime.     rosuvastatin  (CRESTOR ) 5 MG tablet TAKE 1 TABLET (5 MG TOTAL) BY MOUTH DAILY. 90 tablet 3   SUMAtriptan  (IMITREX ) 100 MG tablet Take 1 tablet at earliest onset of migraine.  May repeat in 2 hours if headache persists or recurs.  Maximum 2 tablets in 24 hours. 10 tablet 5   tirzepatide  (MOUNJARO ) 10 MG/0.5ML Pen Inject 10 mg into the skin once a week. 6 mL 3   tiZANidine  (ZANAFLEX ) 4 MG tablet Take 1 tablet (4 mg total) by mouth 3 (three) times daily. 90 tablet 5   VENTOLIN  HFA 108 (90 Base) MCG/ACT inhaler INHALE 1-2 PUFFS BY MOUTH EVERY 6 HOURS AS NEEDED FOR WHEEZE OR SHORTNESS OF BREATH 18 each 2   zolpidem  (AMBIEN ) 5 MG tablet Take 1 tablet (5 mg total) by mouth at bedtime as needed for sleep. 30 tablet 5   desonide  (DESOWEN ) 0.05 % cream Apply topically 2 (two) times daily. (Patient not taking: Reported on 09/13/2023) 30 g 5   predniSONE  (STERAPRED UNI-PAK 48 TAB) 5 MG (48) TBPK tablet 12 day dosepack po (Patient not taking: Reported on 09/13/2023) 48 tablet 0   No facility-administered medications prior to visit.   Review of Systems  Review of Systems  Constitutional: Negative.   HENT:  Positive for postnasal drip, sinus pressure and sinus pain.   Respiratory: Negative.    Cardiovascular: Negative.   Psychiatric/Behavioral:  Positive for sleep disturbance.     Physical Exam  BP 119/79   Pulse 78   Ht 5' 2 (1.575 m)   Wt 177 lb 14.4 oz (80.7 kg)   LMP 07/22/2023   SpO2 97%   BMI 32.54 kg/m  Physical Exam Constitutional:      General: She is not in acute distress.    Appearance: Normal appearance. She is well-developed. She is not ill-appearing.  HENT:     Head: Normocephalic and atraumatic.     Right Ear: Tympanic membrane normal.     Left Ear: Tympanic membrane normal.     Mouth/Throat:  Mouth: Mucous membranes are moist.     Pharynx: Oropharynx is clear.  Eyes:     Pupils: Pupils are equal, round, and reactive to light.  Cardiovascular:     Rate and Rhythm: Normal rate and regular rhythm.     Heart sounds: Normal heart sounds. No murmur heard. Pulmonary:     Effort: Pulmonary effort is normal. No respiratory distress.     Breath sounds: Normal breath sounds. No wheezing or rhonchi.  Abdominal:     General: Bowel sounds are normal.     Palpations: Abdomen is soft.     Tenderness: There is no abdominal tenderness.  Musculoskeletal:        General: Normal range of motion.     Cervical back: Normal range of motion and neck supple.  Skin:    General: Skin is warm and dry.     Findings: No erythema or rash.  Neurological:     General: No focal deficit present.     Mental Status: She is alert and oriented to person, place, and time. Mental status is at baseline.  Psychiatric:        Mood and Affect: Mood normal.        Behavior: Behavior normal.        Thought Content: Thought content normal.        Judgment: Judgment normal.     Lab Results:  CBC    Component Value Date/Time   WBC 11.3 (H) 07/22/2023 1459   WBC 7.5 06/27/2023 1017   RBC 4.34 07/22/2023 1459   HGB 9.9 (L) 07/22/2023 1459   HCT 32.2 (L) 07/22/2023 1459   PLT 278 07/22/2023 1459   MCV 74.2 (L) 07/22/2023 1459   MCH 22.8 (L) 07/22/2023 1459   MCHC 30.7 07/22/2023 1459   RDW 15.9 (H) 07/22/2023 1459   LYMPHSABS 2.5 07/22/2023 1459   MONOABS  0.8 07/22/2023 1459   EOSABS 0.1 07/22/2023 1459   BASOSABS 0.0 07/22/2023 1459    BMET    Component Value Date/Time   NA 138 07/22/2023 1459   K 4.0 07/22/2023 1459   CL 105 07/22/2023 1459   CO2 21 (L) 07/22/2023 1459   GLUCOSE 116 (H) 07/22/2023 1459   BUN 12 07/22/2023 1459   CREATININE 0.71 07/22/2023 1459   CREATININE 0.73 06/27/2023 1440   CALCIUM  9.4 07/22/2023 1459   GFRNONAA >60 07/22/2023 1459   GFRAA >60 06/20/2019 1548    BNP No results found for: BNP  ProBNP No results found for: PROBNP  Imaging: US  LIMITED JOINT SPACE STRUCTURES UP RIGHT(NO LINKED CHARGES) Result Date: 09/05/2023 Left femoral cutaneous nerve hydrodissection Procedure: Real-time Ultrasound Guided left lateral femoral cutaneous nerve hydrodissection and carpal tunnel injection Device: Philips Affiniti 50G/GE Logiq Images permanently stored and available for review in PACS Verbal informed consent obtained.  Discussed risks and benefits of procedure. Warned about infection, bleeding, hyperglycemia damage to structures among others. Patient expresses understanding and agreement Time-out conducted.  Noted no overlying erythema, induration, or other signs of local infection.  Skin prepped in a sterile fashion.  Local anesthesia: Topical Ethyl chloride.  With sterile technique and under real time ultrasound guidance: 40 mg of Kenalog  and 1 mL of lidocaine  injected into soft tissue around the left lateral from medial cutaneous nerve. Fluid seen entering the soft tissue around the femoral cutaneous nerve   Completed without difficulty  Pain immediately resolved suggesting accurate placement of the medication.  Advised to call if fevers/chills, erythema, induration, drainage, or  persistent bleeding.  Images permanently stored and available for review in the ultrasound unit. Impression: Technically successful ultrasound guided injection.    DG Hand Complete Right Result Date: 08/19/2023 CLINICAL DATA:  right  hand pain EXAM: RIGHT HAND - COMPLETE 3+ VIEW COMPARISON:  None Available. FINDINGS: No acute fracture or dislocation. Joint spaces and alignment are maintained. No area of erosion or osseous destruction. No unexpected radiopaque foreign body. Trace amount of subcutaneous air versus trapped air underneath an overlying bandage along the radial wrist superficially IMPRESSION: 1. No acute fracture or dislocation. 2. Trace amount of subcutaneous air versus trapped air underneath an overlying bandage along the radial wrist superficially. Correlate with physical exam. Electronically Signed   By: Corean Salter M.D.   On: 08/19/2023 14:22     Assessment & Plan:   1. Hx of sleep apnea (Primary) - Home sleep test; Future  2. Acute recurrent maxillary sinusitis   Assessment and Plan Assessment & Plan Obstructive sleep apnea, evaluation for recurrence Obstructive sleep apnea diagnosed in 2016 prior to gastric bypass surgery. Self-discontinued CPAP therapy after one year. Reports fatigue and insomnia, raising concern for recurrence. Weight loss of 80 pounds since original diagnosis may have improved the condition. Sister reports snoring, suggesting possible persistence. Severity of previous diagnosis uncertain, but she recalls it being severe. CPAP was previously tolerated but not preferred due to discomfort and claustrophobia. Untreated sleep apnea can increase risk for cardiac arrhythmia, stroke, pulmonary hypertension, and diabetes. - Order home sleep study to evaluate for recurrence. - Discuss potential resumption of CPAP therapy based on sleep study results. - Consider oral appliance therapy if CPAP is not tolerated, noting potential cost implications. - Avoid surgical options until weight loss goals are achieved.  Insomnia on chronic hypnotic therapy Chronic insomnia managed with 10 mg of Ambien  prescribed by psychiatrist. Reports taking 30-45 minutes to fall asleep with medication, and refusal  to sleep without it. Bedtime between 9 and 11 PM, with occasional awakenings during the night. Insomnia may contribute to overall fatigue and daytime dysfunction.  Obesity on pharmacologic therapy Obesity managed with Mounjaro  10 mg, contributing to an 80-pound weight loss. She is not yet at her weight loss goal and continues to work towards it. Weight loss is expected to potentially improve sleep apnea symptoms.  Recurrent maxillary sinusitis - RX Augmentin  and prednisone    Almarie LELON Ferrari, NP 09/13/2023

## 2023-09-13 NOTE — Telephone Encounter (Signed)
**Note De-identified  [place] Obfuscation** Please advise 

## 2023-09-15 ENCOUNTER — Encounter: Payer: Self-pay | Admitting: Physical Therapy

## 2023-09-17 ENCOUNTER — Ambulatory Visit: Payer: Self-pay | Admitting: Physical Therapy

## 2023-09-18 ENCOUNTER — Ambulatory Visit: Attending: Neurology | Admitting: Physical Therapy

## 2023-09-18 DIAGNOSIS — M546 Pain in thoracic spine: Secondary | ICD-10-CM | POA: Insufficient documentation

## 2023-09-18 DIAGNOSIS — M25552 Pain in left hip: Secondary | ICD-10-CM | POA: Diagnosis present

## 2023-09-18 DIAGNOSIS — M5459 Other low back pain: Secondary | ICD-10-CM | POA: Insufficient documentation

## 2023-09-20 ENCOUNTER — Encounter: Payer: Self-pay | Admitting: Physical Therapy

## 2023-09-20 NOTE — Therapy (Signed)
 OUTPATIENT PHYSICAL THERAPY/AQUATIC THERAPY TREATMENT NOTE   Patient Name: Rigby Swamy MRN: [id number] DOB:1975-05-04, 48 y.o., female Today's Date: 09/20/2023  END OF SESSION:  PT End of Session - 09/20/23 1554     Visit Number 5    Number of Visits 17   eval + 8 land visits, 8 aquatic visits   Date for PT Re-Evaluation 10/04/23    Authorization Type [employer] - no auth required    Authorization - Visit Number 5    Authorization - Number of Visits 27    PT Start Time 1445    PT Stop Time 1535    PT Time Calculation (min) 50 min    Equipment Utilized During Treatment Other (comment)   barbells   Activity Tolerance Patient tolerated treatment well    Behavior During Therapy WFL for tasks assessed/performed          Past Medical History:  Diagnosis Date   Abnormal Pap smear of cervix    Allergy    Anxiety    Asthma    Bipolar 1 disorder (HCC)    Chronic pain    Depression    Diabetes mellitus without complication (HCC)    Diverticulitis    GERD (gastroesophageal reflux disease)    HPV in female    Hyperlipidemia    Hypertension    Migraines    Osteoarthritis    Ovarian cyst    right   PCOS (polycystic ovarian syndrome)    STD (sexually transmitted disease)    Trichomonas contact, treated    Vertigo    Past Surgical History:  Procedure Laterality Date   BOWEL RESECTION     gasticbypass     GASTRIC BYPASS  2016   KNEE ARTHROSCOPY Bilateral    SHOULDER SURGERY Left    TOTAL HIP ARTHROPLASTY Left    Patient Active Problem List   Diagnosis Date Noted   Meralgia paresthetica of left side 07/15/2023   Fibromyalgia 06/27/2023   Ovarian cyst 02/20/2023   Essential hypertension 02/20/2023   Bipolar 1 disorder, depressed (HCC) 02/16/2022   Paresthesia 09/13/2021   CIN I (cervical intraepithelial neoplasia I) 03/03/2021   LSIL pap smear of cervix/human papillomavirus (HPV) positive 10/24/2020   Family history of early CAD 10/24/2020   B12 deficiency  07/26/2020   Somatic dysfunction of spine, cervical 07/21/2020   Status post left hip replacement 03/12/2019   T2DM (type 2 diabetes mellitus) (HCC) 07/31/2018   Dyslipidemia associated with type 2 diabetes mellitus (HCC) 07/31/2018   Allergic rhinitis 07/31/2018   Mild intermittent asthma, uncomplicated 07/31/2018   GERD (gastroesophageal reflux disease) 07/31/2018   S/P gastric bypass 07/31/2018   Migraine 07/31/2018   Anxiety 07/31/2018   Depression, major, single episode, complete remission (HCC) 07/31/2018   Insomnia 07/31/2018   Umbilical hernia 07/31/2018   Iron deficiency anemia 06/09/2018   Cervicogenic headache 05/12/2018   Low back pain 05/12/2018   Polyarthralgia 05/12/2018    PCP: Kennyth Bart, MD  REFERRING PROVIDER: Joane Artist RAMAN, MD; Skeet Juliene SAUNDERS, DO  REFERRING DIAG:  Diagnosis  M25.50 (ICD-10-CM) - Polyarthralgia  M79.7 (ICD-10-CM) - Fibromyalgia  R53.1 (ICD-10-CM) - Weakness   H81.13 (ICD-10-CM) - Benign paroxysmal positional vertigo due to bilateral vestibular disorder  Rationale for Evaluation and Treatment: Rehabilitation  THERAPY DIAG:  Other low back pain  Pain in thoracic spine  Pain in left hip  ONSET DATE: early 2024 for onset of chronic pain: Referral date 07-15-23  SUBJECTIVE:  SUBJECTIVE STATEMENT:  Pt reports she is doing better today - was sick yesterday and had to cancel land PT appt - had upset stomach from something she ate   Pt reports Lt leg feels weaker than RLE; says RLE is starting to get weaker based on compensation for LLE weakness; has a lot of nerve pain in LLE Currently not doing any exercises  Per chart note from Dr. Joane on 07-15-23:   Heidi Holland is a 48 y.o. female who presents to [facility] today for  polyarthralgia. L hip replacement in 2019. PCP and rheum dx her w/ fibromyalgia. Now bilat knees hurt, L>R. Hx of arthroscopy in both knees. She notes chronic pain for the past year and a half. Has had to decrease work to half days. She notes over weakness and lack of strength. Pt also c/o L lateral thigh pain w/ radiating pain distally to her L foot.    PERTINENT HISTORY:  Lt hip THR Nov. 2019, migraines, s/p gastric bypass, fibromyalgia, polyarthralgia  PAIN:  Are you having pain? Yes: NPRS scale: 5/10 Pain location: generalized Pain description: achy, throbbing, arthritic feeling Aggravating factors: moving or lying down; standing for too long period of time Relieving factors: pt has routine - has TENS unit, heating pad, wrist splints, compression gloves for hands and compression stockings  PRECAUTIONS: Other: None  RED FLAGS: None   WEIGHT BEARING RESTRICTIONS: No  FALLS:  Has patient fallen in last 6 months? No and several stumbles - did fall off of bed couple of months ago - was sitting [place] style on bed and lost balance reaching down for something  LIVING ENVIRONMENT: Lives with: lives alone Lives in: Other Duplex Stairs: 1 step only to enter  Has following equipment at home: None  OCCUPATION: works part-time in Engineering geologist  PLOF: Independent  PATIENT GOALS: be able to gain strength; reduce pain  NEXT MD VISIT: 08-19-23 with Dr. Joane:  08-23-23 with Dr. Skeet for Botox  injections  OBJECTIVE:  Note: Objective measures were completed at Evaluation unless otherwise noted.  DIAGNOSTIC FINDINGS:  MRI is scheduled in Oct. - must be standing MRI per pt due to claustrophobia  PATIENT SURVEYS:  Modified Oswestry:  MODIFIED OSWESTRY DISABILITY SCALE  Date: 08-15-23 Score  Pain intensity 3 =  Pain medication provides me with moderate relief from pain.  2. Personal care (washing, dressing, etc.) 2 =  It is painful to take care of myself, and I am slow and careful.  3. Lifting 4 =  I can lift only very light weights  4. Walking 3 =  Pain prevents me from walking more than  mile.  5. Sitting 1 =  I can only sit in my favorite chair as long as I like.  6. Standing 1 =  I can stand as long as I want but, it increases my pain.  7. Sleeping 3 =  Even when I take pain medication, I sleep less than 4 hours.  8. Social Life 3 =  Pain prevents me from going out very often.  9. Traveling 2 =  My pain restricts my travel over 2 hours.  10. Employment/ Homemaking 3 = Pain prevents me from doing anything but light duties.  Total 25/50 ( 50% =severe disability)   Interpretation of scores: Score Category Description  0-20% Minimal Disability The patient can cope with most living activities. Usually no treatment is indicated apart from advice on lifting, sitting and exercise  21-40% Moderate Disability  The patient experiences more pain and difficulty with sitting, lifting and standing. Travel and social life are more difficult and they may be disabled from work. Personal care, sexual activity and sleeping are not grossly affected, and the patient can usually be managed by conservative means  41-60% Severe Disability Pain remains the main problem in this group, but activities of daily living are affected. These patients require a detailed investigation  61-80% Crippled Back pain impinges on all aspects of the patient's life. Positive intervention is required  81-100% Bed-bound  These patients are either bed-bound or exaggerating their symptoms  Bluford FORBES Zoe DELENA Karon DELENA, et al. Surgery versus conservative management of stable thoracolumbar fracture: the PRESTO feasibility RCT. [place]): [employer]; 2021 Nov. [name] Technology Assessment, No. 25.62.) Appendix 3, Oswestry Disability Index category descriptors. Available from: FindJewelers.cz  Minimally Clinically Important Difference (MCID) = 12.8%  COGNITION: Overall cognitive status:  Within functional limits for tasks assessed     SENSATION: Numbness and tingling down Lt lateral thigh and sometimes in her hands when she wakes up   POSTURE: No Significant postural limitations  PALPATION: Tenderness with palpation of spinous process area of approx. T10-T12 vertebral level  LUMBAR ROM: WFL's for all motions  AROM eval  Flexion   Extension   Right lateral flexion   Left lateral flexion   Right rotation   Left rotation    (Blank rows = not tested)  LOWER EXTREMITY ROM:  WNL's bil. LE's    LOWER EXTREMITY MMT:    MMT Right eval Left eval  Hip flexion 5 4 - c/o pain with resistance  Hip extension 5 4  Hip abduction 5 4  Hip adduction    Hip internal rotation    Hip external rotation    Knee flexion 4 c/o pain with resistance 4 c/o pain with resistance  Knee extension 5 5  Ankle dorsiflexion 5 5  Ankle plantarflexion    Ankle inversion    Ankle eversion     (Blank rows = not tested)  LUMBAR SPECIAL TESTS:  N/A  FUNCTIONAL TESTS:  5 times sit to stand: 19.34 secs Timed up and go (TUG): 11.22 secs without device 10 meter walk test: 10.10 secs = 3.25 ft/sec without device  GAIT: Distance walked: 45'  Assistive device utilized: None Level of assistance: Complete Independence Comments: pt reports today is a good day - reports gait pattern changes when pain is increased  Vertigo Assessment: Rt Dix-Hallpike test - pt reported vertigo but no nystagmus noted in test position Lt Dix-Hallpike test (+) for Lt rotary upbeating nystagmus and c/o vertigo in test position  TREATMENT DATE: 09-18-23                                                                                                                               Aquatic PT at [facility] - pool temp 90 degrees  Patient seen for aquatic therapy today.  Treatment  took place in water 3.6 - 4.8 feet deep depending upon activity.  Pt entered and exited the pool via step negotiation with use of hand  rails modified independently.   Warm up: water walking - forwards 4 laps (18' x 8); backwards 2 laps (18' x 4 reps) and sideways 2 laps (18' x 4 reps)   Stretching:  Runner's stretch for hamstring and gastroc stretch - 20 sec hold x 1 rep each leg Heel cord stretch each leg with forefoot on pool wall - 20 sec hold x 1 rep each leg   Core strengthening exercises:  pt stood with feet together - used hand bells for increased resistance with water current to perform shoulder flexion/extension exercise 10 reps with cues to tighten core for improved stabilization: performed horizontal abduction/adduction with hand bells 10 reps x 1 set   Pt performed stepping forward/back with 1 leg with simultaneously pushing barbells forward during the stepping, then stepping back and pulling barbells back 10 reps each leg; stepping sideways and back in with shoulder horizontal abdct./adduction during the stepping - 10 reps each leg: pt then performed stepping backward/forward with pushing barbells forward/ pulling barbells in while stepping back to starting position - cues to tighten core for improved core stabilization   Performed heel raises bil. LE's 10 reps   Balance exercises:  marching in place 10 reps x 2 sets  Standing on 1 leg holding large barbells - made circles CW 10 reps, then CCW 10 reps each leg for improved SLS on each leg  Standing hip flexion, abduction and extension 10 reps each leg for increased resistance for strengthening    Pt swam 2 laps in lap pool end of session - with rest breaks prn - with supervision  Pt requires the buoyancy of water for active assisted exercises with buoyancy supported for strengthening and AROM exercises.  Buoyancy of water provides spinal decompression and joint offloading to allow movement with reduced pain, facilitating strengthening and flexibility exercises with decreased pain compared to performance on land.  Hydrostatic pressure also supports joints by  unweighting joint load by at least 50 % in 3-4 feet depth water. 80% in chest to neck deep water. Water will provide assistance with movement using the current and laminar flow while the buoyancy reduces weight bearing. Pt requires the viscosity of the water for resistance with strengthening exercises.  Balance exercises able to be safely performed in aquatic environment without the fall risk with performing same exercises on land.       PATIENT EDUCATION:  Education details: article on BPPV etiology, aquatic info given to pt Person educated: Patient Education method: Chief Technology Officer Education comprehension: verbalized understanding  HOME EXERCISE PROGRAM: To be established  ASSESSMENT:  CLINICAL IMPRESSION: Aquatic PT session focused on core strengthening, water walking and LE strengthening exercises.  Pt reports LLE is slightly weaker than RLE.  Pt reported some rib pain/discomfort due to episodes of emesis due to upset stomach yesterday.  Pt reported feeling better at end of session and requested to attempt to swim laps in lap pool at end of session.  Pt swam 2 laps with rest breaks prn.  Pt tolerated aquatic exercises (and lap swimming) well.     OBJECTIVE IMPAIRMENTS: decreased activity tolerance, decreased endurance, difficulty walking, decreased ROM, decreased strength, and pain.   ACTIVITY LIMITATIONS: carrying, lifting, bending, squatting, and locomotion level  PARTICIPATION LIMITATIONS: cleaning, laundry, shopping, community activity, occupation, and pt does not currently drive  PERSONAL FACTORS: Behavior  pattern, Past/current experiences, Time since onset of injury/illness/exacerbation, and 1-2 comorbidities: fibromyalgia and s/p Lt THR are also affecting patient's functional outcome.   REHAB POTENTIAL: Good  CLINICAL DECISION MAKING: Evolving/moderate complexity  EVALUATION COMPLEXITY: Moderate   GOALS: Goals reviewed with patient? Yes  SHORT TERM GOALS:  Target date: 09-13-23  Initiate aquatic therapy to assist with pain management.  Baseline:  to be scheduled Goal status: Goal met 08-21-23  2.  Pt will report reduced back pain to </= 4/10 to increase tolerance and ease with mobility, ADL's and work activities. Baseline: 5/10 on a good day Goal status: IN PROGRESS 09-12-23  3.  Pt will have a (-) Lt Dix-Hallpike test with no nystagmus and no c/o vertigo to indicate resolution of Lt BPPV. Baseline: (+) Lt Dix-Hallpike test on 08-15-23 Goal status: Goal met 09-12-23  4.  Improve Modified Oswestry questionnaire score to </= 40% to demo improvement in LBP and functional capabilities.  Baseline: 50% (25/50) on 08-15-23 Goal status: IN PROGRESS 09-12-23  5.  Independent in land based HEP for LE and low strengthening & stretching exercises. Baseline: Dependent Goal status: IN PROGRESS 09-12-23    LONG TERM GOALS: Target date: 10-11-23  Pt will report reduced back pain to </= 3/10 to increase tolerance and ease with mobility, ADL's and work activities. Baseline: 5/10 on a good day Goal status: INITIAL  2.  Improve Modified Oswestry questionnaire score to </= 30% to demo improvement in LBP and functional capabilities.  Baseline: 50% (25/50) on 08-15-23 Goal status: INITIAL  3.  Pt will improve 5x sit to stand transfer score to </= 15 secs to demo increased LE strength and reduced back pain with this movement. Baseline: 19.34 secs without UE support from chair Goal status: INITIAL  4.  Independent in aquatic HEP and updated land based HEP to be continued upon discharge from PT to improve functional mobility and assist with pain management. Baseline: Dependent Goal status: INITIAL  5.  Pt will verbalize understanding of Epley maneuver for self treatment of BPPV if needed pending episodic re-occurrence in future.  Baseline: Dependent Goal status: INITIAL   PLAN:  PT FREQUENCY: 2x/week (1 land/1 aquatic)  PT DURATION: 8 weeks  PLANNED  INTERVENTIONS: 97110-Therapeutic exercises, 97530- Therapeutic activity, [id number]- Neuromuscular re-education, 97535- Self Care, 02859- Manual therapy, [id number]- Gait training, [phone]- Canalith repositioning, [id number]- Aquatic Therapy, and [id number]- Electrical stimulation (manual).  PLAN FOR NEXT SESSION: land - recheck Lt BPPV; begin manual therapy for mid & Low back areas, HEP for stretching  Aquatic - water walking, [place] for pain management , LE strengthening and core stabilization   Haille Pardi, [place], PT 09/20/2023, 4:00 PM   Check all possible CPT codes: 02889- Therapeutic Exercise, [phone]- Neuro Re-education, [phone] - Gait Training, [phone] - Manual Therapy, [phone] - Therapeutic Activities, [phone] - Self Care, [phone] - Electrical stimulation (Manual), [id number] - Aquatic therapy, and [phone] - Canalith Repositioning    Check all conditions that are expected to impact treatment: Musculoskeletal disorders and Psychological or psychiatric disorders   If treatment provided at initial evaluation, no treatment charged due to lack of authorization.

## 2023-09-23 ENCOUNTER — Ambulatory Visit

## 2023-09-24 ENCOUNTER — Ambulatory Visit: Payer: Self-pay | Admitting: Physical Therapy

## 2023-09-30 ENCOUNTER — Ambulatory Visit: Payer: Self-pay | Admitting: Physical Therapy

## 2023-10-03 ENCOUNTER — Ambulatory Visit: Payer: Self-pay | Admitting: Physical Therapy

## 2023-10-03 DIAGNOSIS — F411 Generalized anxiety disorder: Secondary | ICD-10-CM | POA: Diagnosis not present

## 2023-10-03 DIAGNOSIS — F4310 Post-traumatic stress disorder, unspecified: Secondary | ICD-10-CM | POA: Diagnosis not present

## 2023-10-03 DIAGNOSIS — F410 Panic disorder [episodic paroxysmal anxiety] without agoraphobia: Secondary | ICD-10-CM | POA: Diagnosis not present

## 2023-10-03 DIAGNOSIS — F3132 Bipolar disorder, current episode depressed, moderate: Secondary | ICD-10-CM | POA: Diagnosis not present

## 2023-10-03 DIAGNOSIS — F5102 Adjustment insomnia: Secondary | ICD-10-CM | POA: Diagnosis not present

## 2023-10-04 ENCOUNTER — Other Ambulatory Visit: Payer: Self-pay | Admitting: Physician Assistant

## 2023-10-04 ENCOUNTER — Other Ambulatory Visit: Payer: Self-pay | Admitting: Family Medicine

## 2023-10-07 ENCOUNTER — Ambulatory Visit: Admitting: Physical Therapy

## 2023-10-08 ENCOUNTER — Ambulatory Visit: Payer: Self-pay | Admitting: Physical Therapy

## 2023-10-08 DIAGNOSIS — F3132 Bipolar disorder, current episode depressed, moderate: Secondary | ICD-10-CM | POA: Diagnosis not present

## 2023-10-08 DIAGNOSIS — F411 Generalized anxiety disorder: Secondary | ICD-10-CM | POA: Diagnosis not present

## 2023-10-08 DIAGNOSIS — F5102 Adjustment insomnia: Secondary | ICD-10-CM | POA: Diagnosis not present

## 2023-10-08 DIAGNOSIS — F410 Panic disorder [episodic paroxysmal anxiety] without agoraphobia: Secondary | ICD-10-CM | POA: Diagnosis not present

## 2023-10-08 DIAGNOSIS — F4310 Post-traumatic stress disorder, unspecified: Secondary | ICD-10-CM | POA: Diagnosis not present

## 2023-10-16 ENCOUNTER — Other Ambulatory Visit (HOSPITAL_COMMUNITY)
Admission: RE | Admit: 2023-10-16 | Discharge: 2023-10-16 | Disposition: A | Discharge status: Home / Self Care | Source: Ambulatory Visit | Attending: Obstetrics and Gynecology | Admitting: Obstetrics and Gynecology

## 2023-10-16 ENCOUNTER — Ambulatory Visit: Admitting: Obstetrics and Gynecology

## 2023-10-16 ENCOUNTER — Other Ambulatory Visit: Payer: Self-pay

## 2023-10-16 VITALS — BP 107/84 | HR 98 | Wt 174.2 lb

## 2023-10-16 DIAGNOSIS — Z113 Encounter for screening for infections with a predominantly sexual mode of transmission: Secondary | ICD-10-CM

## 2023-10-16 DIAGNOSIS — Z01419 Encounter for gynecological examination (general) (routine) without abnormal findings: Secondary | ICD-10-CM

## 2023-10-16 DIAGNOSIS — Z01411 Encounter for gynecological examination (general) (routine) with abnormal findings: Secondary | ICD-10-CM | POA: Diagnosis not present

## 2023-10-16 DIAGNOSIS — Z1151 Encounter for screening for human papillomavirus (HPV): Secondary | ICD-10-CM | POA: Diagnosis not present

## 2023-10-16 DIAGNOSIS — R87612 Low grade squamous intraepithelial lesion on cytologic smear of cervix (LGSIL): Secondary | ICD-10-CM | POA: Insufficient documentation

## 2023-10-16 DIAGNOSIS — R87810 Cervical high risk human papillomavirus (HPV) DNA test positive: Secondary | ICD-10-CM | POA: Diagnosis not present

## 2023-10-16 DIAGNOSIS — Z8742 Personal history of other diseases of the female genital tract: Secondary | ICD-10-CM | POA: Diagnosis not present

## 2023-10-16 NOTE — Progress Notes (Signed)
 GYNECOLOGY ANNUAL PREVENTATIVE CARE ENCOUNTER NOTE  History:     Heidi Holland is a 48 y.o. G0P0000 female here for a routine annual gynecologic exam.  Current complaints: menses becoming more irregular, possible perimenopause.   Denies abnormal vaginal bleeding, discharge, pelvic pain, problems with intercourse or other gynecologic concerns.    Gynecologic History Patient's last menstrual period was 09/22/2023 (exact date). Contraception: none Last Pap: 01/19/21. Results were: CIN 1  Last mammogram: n/a  Obstetric History OB History  Gravida Para Term Preterm AB Living  0 0 0 0 0 0  SAB IAB Ectopic Multiple Live Births  0 0 0 0 0    Past Medical History:  Diagnosis Date   Abnormal Pap smear of cervix    Allergy    Anxiety    Asthma    Bipolar 1 disorder (HCC)    Chronic pain    Depression    Diabetes mellitus without complication (HCC)    Diverticulitis    GERD (gastroesophageal reflux disease)    HPV in female    Hyperlipidemia    Hypertension    Migraines    Osteoarthritis    Ovarian cyst    right   PCOS (polycystic ovarian syndrome)    STD (sexually transmitted disease)    Trichomonas contact, treated    Vertigo     Past Surgical History:  Procedure Laterality Date   BOWEL RESECTION     gasticbypass     GASTRIC BYPASS  2016   KNEE ARTHROSCOPY Bilateral    SHOULDER SURGERY Left    TOTAL HIP ARTHROPLASTY Left     Current Outpatient Medications on File Prior to Visit  Medication Sig Dispense Refill   AUVELITY 45-105 MG TBCR Take by mouth.     azelastine  (ASTELIN ) 0.1 % nasal spray Place 2 sprays into both nostrils 2 (two) times daily. 30 mL 12   botulinum toxin Type A  (BOTOX ) 200 units injection Inject 155 units IM into multiple site in the face,neck and head once every 90 days 1 each 4   clonazePAM  (KLONOPIN ) 0.5 MG tablet Take 1 tablet (0.5 mg total) by mouth 2 (two) times daily as needed. for anxiety 60 tablet 0   HYDROcodone -acetaminophen  (NORCO)  10-325 MG tablet Take 1 tablet by mouth every 6 (six) hours as needed.     losartan  (COZAAR ) 50 MG tablet Take 1 tablet (50 mg total) by mouth daily. 90 tablet 1   lumateperone tosylate (CAPLYTA) 42 MG capsule      methocarbamol  (ROBAXIN ) 750 MG tablet Take 1 tablet (750 mg total) by mouth 3 (three) times daily as needed. 90 tablet 5   ondansetron  (ZOFRAN -ODT) 4 MG disintegrating tablet TAKE 1 TABLET (4 MG TOTAL) BY MOUTH EVERY 6 (SIX) HOURS AS NEEDED FOR NAUSEA OR VOMITING 18 tablet 11   pantoprazole  (PROTONIX ) 40 MG tablet Take 1 tablet (40 mg total) by mouth daily. 90 tablet 1   pregabalin  (LYRICA ) 150 MG capsule TAKE 1 CAPSULE BY MOUTH TWICE A DAY 60 capsule 5   promethazine  (PHENERGAN ) 25 MG tablet TAKE 1 TABLET BY MOUTH EVERY 8 HOURS AS NEEDED FOR NAUSEA OR VOMITING. 20 tablet 0   QUEtiapine  (SEROQUEL ) 200 MG tablet Take 2 tablets by mouth at bedtime.     rosuvastatin  (CRESTOR ) 5 MG tablet TAKE 1 TABLET (5 MG TOTAL) BY MOUTH DAILY. 90 tablet 3   SUMAtriptan  (IMITREX ) 100 MG tablet Take 1 tablet at earliest onset of migraine.  May repeat in 2 hours  if headache persists or recurs.  Maximum 2 tablets in 24 hours. 10 tablet 5   tirzepatide  (MOUNJARO ) 10 MG/0.5ML Pen Inject 10 mg into the skin once a week. 6 mL 3   tiZANidine  (ZANAFLEX ) 4 MG tablet Take 1 tablet (4 mg total) by mouth 3 (three) times daily. 90 tablet 5   VENTOLIN  HFA 108 (90 Base) MCG/ACT inhaler INHALE 1-2 PUFFS BY MOUTH EVERY 6 HOURS AS NEEDED FOR WHEEZE OR SHORTNESS OF BREATH 18 each 2   zolpidem  (AMBIEN ) 5 MG tablet Take 1 tablet (5 mg total) by mouth at bedtime as needed for sleep. 30 tablet 5   Continuous Glucose Sensor (FREESTYLE LIBRE 3 PLUS SENSOR) MISC Change sensor every 15 days. (Patient not taking: Reported on 10/16/2023) 2 each 3   No current facility-administered medications on file prior to visit.    Allergies  Allergen Reactions   Ciprofloxacin Itching   Wound Dressing Adhesive     Social History:  reports  that she has never smoked. She has never used smokeless tobacco. She reports that she does not currently use alcohol. She reports current drug use. Drug: Marijuana.  Family History  Problem Relation Age of Onset   Stroke Mother    Heart disease Mother    Lung cancer Mother    Rheum arthritis Mother    Arthritis Mother    COPD Mother    Fibromyalgia Mother    Hypertension Father    Diabetes Father    Heart disease Father    Hodgkin's lymphoma Father    Endometriosis Sister    Drug abuse Brother        died of overdose   Breast cancer Paternal Aunt    Diabetes Maternal Grandmother    Lung cancer Maternal Grandmother    Other Maternal Grandmother        pace maker   Skin cancer Paternal Grandmother     The following portions of the patient's history were reviewed and updated as appropriate: allergies, current medications, past family history, past medical history, past social history, past surgical history and problem list.  Review of Systems Pertinent items noted in HPI and remainder of comprehensive ROS otherwise negative.  Physical Exam:  BP 107/84   Pulse 98   Wt 174 lb 3.2 oz (79 kg)   LMP 09/22/2023 (Exact Date)   BMI 31.86 kg/m  CONSTITUTIONAL: Well-developed, well-nourished female in no acute distress.  HENT:  Normocephalic, atraumatic, External right and left ear normal. Oropharynx is clear and moist EYES: Conjunctivae and EOM are normal.  NECK: Normal range of motion, supple, no masses.  Normal thyroid .  SKIN: Skin is warm and dry. No rash noted. Not diaphoretic. No erythema. No pallor. MUSCULOSKELETAL: Normal range of motion. No tenderness.  No cyanosis, clubbing, or edema.  2+ distal pulses. NEUROLOGIC: Alert and oriented to person, place, and time. Normal reflexes, muscle tone coordination.  PSYCHIATRIC: Normal mood and affect. Normal behavior. Normal judgment and thought content. CARDIOVASCULAR: Normal heart rate noted, regular rhythm RESPIRATORY: Clear to  auscultation bilaterally. Effort and breath sounds normal, no problems with respiration noted. BREASTS: Symmetric in size. No masses, tenderness, skin changes, nipple drainage, or lymphadenopathy bilaterally. Performed in the presence of a chaperone. ABDOMEN: Soft, no distention noted.  No tenderness, rebound or guarding.  PELVIC: Normal appearing external genitalia and urethral meatus; small skin tag on left inner thigh. normal appearing vaginal mucosa and cervix.  No abnormal discharge noted.  Pap smear obtained. Vaginal swab taken.  Normal  uterine size, no other palpable masses, no uterine or adnexal tenderness.  Performed in the presence of a chaperone.   Assessment and Plan:    1. Cervical smear, as part of routine gynecological examination (Primary)  - Cytology - PAP( [place])  2. Women's annual routine gynecological examination Normal annual exam Will schedule mammogram, exam has been scheduled in the past, pt did not go out of fear  3. Routine screening for STI (sexually transmitted infection) Per pt request - Cervicovaginal ancillary only( [place])  4. History of ovarian cyst History of ovarian cyst about a year ago.  Pt requests ultrasound for surveillance - US  PELVIC COMPLETE WITH TRANSVAGINAL; Future  Will follow up results of pap smear and manage accordingly. Mammogram scheduled Routine preventative health maintenance measures emphasized.  F/u in 5-6 weeks for possible colposcopy, skin tag removal and ultrasound follow up Please refer to After Visit Summary for other counseling recommendations.      Jerilynn Buddle, MD, FACOG Obstetrician & Gynecologist, [facility]

## 2023-10-17 ENCOUNTER — Telehealth: Payer: Self-pay | Admitting: Family Medicine

## 2023-10-17 ENCOUNTER — Telehealth: Payer: Self-pay | Admitting: Neurology

## 2023-10-17 ENCOUNTER — Ambulatory Visit (HOSPITAL_COMMUNITY)
Admission: EM | Admit: 2023-10-17 | Discharge: 2023-10-18 | Disposition: A | Discharge status: Other Acute Inpatient Hospital | Attending: Psychiatry | Admitting: Psychiatry

## 2023-10-17 DIAGNOSIS — M2550 Pain in unspecified joint: Secondary | ICD-10-CM

## 2023-10-17 DIAGNOSIS — Z7389 Other problems related to life management difficulty: Secondary | ICD-10-CM | POA: Insufficient documentation

## 2023-10-17 DIAGNOSIS — R531 Weakness: Secondary | ICD-10-CM

## 2023-10-17 DIAGNOSIS — M797 Fibromyalgia: Secondary | ICD-10-CM

## 2023-10-17 DIAGNOSIS — R45851 Suicidal ideations: Secondary | ICD-10-CM | POA: Insufficient documentation

## 2023-10-17 DIAGNOSIS — R4589 Other symptoms and signs involving emotional state: Secondary | ICD-10-CM | POA: Insufficient documentation

## 2023-10-17 DIAGNOSIS — H8113 Benign paroxysmal vertigo, bilateral: Secondary | ICD-10-CM

## 2023-10-17 LAB — CERVICOVAGINAL ANCILLARY ONLY
Bacterial Vaginitis (gardnerella): NEGATIVE
Candida Glabrata: NEGATIVE
Candida Vaginitis: NEGATIVE
Chlamydia: NEGATIVE
Comment: NEGATIVE
Comment: NEGATIVE
Comment: NEGATIVE
Comment: NEGATIVE
Comment: NEGATIVE
Comment: NORMAL
Neisseria Gonorrhea: NEGATIVE
Trichomonas: NEGATIVE

## 2023-10-17 MED ORDER — HYDROCODONE-ACETAMINOPHEN 5-325 MG PO TABS
1.0000 | ORAL_TABLET | Freq: Once | ORAL | Status: AC
  Administered 2023-10-17: 1 via ORAL
  Filled 2023-10-17: qty 1

## 2023-10-17 MED ORDER — OLANZAPINE 10 MG IM SOLR: 5.0000 mg | Freq: Three times a day (TID) | INTRAMUSCULAR | Status: DC | PRN

## 2023-10-17 MED ORDER — ALUM & MAG HYDROXIDE-SIMETH 200-200-20 MG/5ML PO SUSP: 30.0000 mL | ORAL | Status: DC | PRN

## 2023-10-17 MED ORDER — DIPHENHYDRAMINE HCL 50 MG PO CAPS: 50.0000 mg | ORAL_CAPSULE | Freq: Three times a day (TID) | ORAL | Status: DC | PRN

## 2023-10-17 MED ORDER — ACETAMINOPHEN 325 MG PO TABS
650.0000 mg | ORAL_TABLET | Freq: Four times a day (QID) | ORAL | Status: DC | PRN
  Administered 2023-10-18 (×2): 650 mg via ORAL
  Filled 2023-10-17 (×2): qty 2

## 2023-10-17 MED ORDER — MAGNESIUM HYDROXIDE 400 MG/5ML PO SUSP: 30.0000 mL | Freq: Every day | ORAL | Status: DC | PRN

## 2023-10-17 MED ORDER — OLANZAPINE 10 MG IM SOLR: 10.0000 mg | Freq: Three times a day (TID) | INTRAMUSCULAR | Status: DC | PRN

## 2023-10-17 MED ORDER — HYDROXYZINE HCL 25 MG PO TABS
50.0000 mg | ORAL_TABLET | ORAL | Status: AC
  Administered 2023-10-17: 50 mg via ORAL
  Filled 2023-10-17: qty 2

## 2023-10-17 MED ORDER — HALOPERIDOL 5 MG PO TABS: 5.0000 mg | ORAL_TABLET | Freq: Three times a day (TID) | ORAL | Status: DC | PRN

## 2023-10-17 MED ORDER — TRAZODONE HCL 100 MG PO TABS: 100.0000 mg | ORAL_TABLET | Freq: Every evening | ORAL | Status: DC | PRN

## 2023-10-17 NOTE — Progress Notes (Signed)
   10/17/23 2140  [facility] Triage Screening (Walk-ins at [facility] only)  How Did You Hear About Us ? Family/Friend  What Is the Reason for Your Visit/Call Today? Pt presents to [facility] as a voluntary walk-in, accompanied by her sister with complaint of SI with a plan to overdose on medication. Pt reports attempting to take her life 2 weeks ago by cutting her wrists and overdosing on medication. Pt did not seek medical treatment and only disclosed to her sister at the time. Pt is very tearful during triage process. She reports chronic pain (Fibromyalgia), history of Bipolar w/ PTSD. Pt states  I feel like a burden most of the time. Pt is caregiver to other relatives and often time unable to function or clean her home due to pain. Pt is established with [facility] (psychiatrist) and outpatient therapy. Pt is unable to contract for safety at this time. Pt currently denies HI,AVH and substance/alcohol use.  How Long Has This Been Causing You Problems? 1 wk - 1 month  Have You Recently Had Any Thoughts About Hurting Yourself? Yes  How long ago did you have thoughts about hurting yourself? currently  Are You Planning to Commit Suicide/Harm Yourself At This time? Yes  Have you Recently Had Thoughts About Hurting Someone Sherral? No  Are You Planning To Harm Someone At This Time? No  Physical Abuse Denies  Verbal Abuse Denies  Sexual Abuse Denies  Exploitation of patient/patient's resources Denies  Self-Neglect Denies  Possible abuse reported to:  (NA)  Are you currently experiencing any auditory, visual or other hallucinations? No  Have You Used Any Alcohol or Drugs in the Past 24 Hours? No  Do you have any current medical co-morbidities that require immediate attention? No (Fibromyalgia and Type II Diabetes)  Clinician description of patient physical appearance/behavior: tearful, cooperative, casually dressed  What Do You Feel Would Help You the Most Today? Treatment for Depression or other mood problem  If  access to [facility] was not available, would you have sought care in the Emergency Department? Yes  Determination of Need Urgent (48 hours)  Options For Referral Other: Comment;BH Urgent Care;Outpatient Therapy;Medication Management;Inpatient Hospitalization  Determination of Need filed? Yes

## 2023-10-17 NOTE — ED Provider Notes (Signed)
 [facility] Continuous Assessment Admission H&P  Date: 10/18/23 Patient Name: Heidi Holland MRN: [id number] Chief Complaint: suicidal ideation   Diagnoses:  Final diagnoses:  Suicidal ideation  Difficulty coping    HPI: Heidi Holland, 48 y/o female with a history of bipolar dx, PTSD, GAD presented to [facility] companied by her sister.  Per the patient she has suicidal ideation with plans to overdose on her pills.  Cording to the patient she has been going through a lot and she just do not feel like anyone he wants her around.  Patient also reports that she is about to get evicted from her apartment.  Patient also went on to state that she lost a couple family members a couple years ago.  Patient can become very tearful when talking.  Patient stated that she lives alone, she currently works part-time at [employer].  According to her she does not have access to a gun.  Per the patient she tried cutting her wrist last week but she did not tell anyone.  An evaluation of patient wrist did not show any recent mark but she did have an old mark that looks healed from a long time ago.  Face-to-face evaluation of patient, patient is alert and oriented x 4, speech is clear, maintaining eye contact.  Patient is very tearful at times.  Patient endorsed suicidal ideation to overdose on pills.  Patient denies HI, AVH or paranoia.  Denies alcohol use.  Reports she smokes marijuana every now and then last time was Monday.  Denies ever being hospitalized for any psychiatric problems.  According to patient she is currently seeing a provider for med management in [place] [facility].  Patient went on to states she gets hydrocodone  every 6 hours and she gets Klonopin  and Ambien  as.  Writer discussed with patient that is not safe to give benzodiazepine and opioids together therefore I may be able to give her a one-time dose of pain medicine but I am not can to be able to schedule her on a every 6 hours hydrocodone  10  325 mg.  Patient also went on to say she gets Seroquel  600 mg at night to sleep with her Ambien .  Writer discussed with patient that she would need to talk to the provider tomorrow as far as her getting those high dosage of medication which includes benzos and opioids.  Patient does seem to be fixated on getting the medications, Clinical research associate discussed with patient that at this point it is about her safety we need to ensure her safety first and then we can think about medicines later on.  She continues to say that although she cannot do without these medicines because that is what she normally takes.  Patient does appear to be medication seeking at this point, Clinical research associate discussed with patient that we would need to reevaluate her medicines in the AM.  Given patient prior history and current presentation recommending inpatient admission when a bed becomes available  Total Time spent with patient: 20 minutes  Musculoskeletal  Strength & Muscle Tone: within normal limits Gait & Station: normal Patient leans: N/A  Psychiatric Specialty Exam  Presentation General Appearance:  Casual  Eye Contact: Good  Speech: Clear and Coherent  Speech Volume: Normal  Handedness: Right   Mood and Affect  Mood: Anxious; Depressed  Affect: Congruent   Thought Process  Thought Processes: Coherent  Descriptions of Associations:Intact  Orientation:Full (Time, Place and Person)  Thought Content:WDL    Hallucinations:Hallucinations: None  Ideas of Reference:None  Suicidal Thoughts:Suicidal Thoughts: Yes, Active SI Active Intent and/or Plan: With Intent; With Plan  Homicidal Thoughts:Homicidal Thoughts: No   Sensorium  Memory: Immediate Good  Judgment: Fair  Insight: Fair   Chartered certified accountant: Fair  Attention Span: Fair  Recall: [employer] of Knowledge: Fair  Language: Fair   Psychomotor Activity  Psychomotor Activity: Psychomotor Activity:  Normal   Assets  Assets: Desire for Improvement; Resilience   Sleep  Sleep: Sleep: Fair Number of Hours of Sleep: 6   Nutritional Assessment (For OBS and FBC admissions only) Has the patient had a weight loss or gain of 10 pounds or more in the last 3 months?: No Has the patient had a decrease in food intake/or appetite?: No Does the patient have dental problems?: No Does the patient have eating habits or behaviors that may be indicators of an eating disorder including binging or inducing vomiting?: No Has the patient recently lost weight without trying?: 0 Has the patient been eating poorly because of a decreased appetite?: 0 Malnutrition Screening Tool Score: 0    Physical Exam HENT:     Head: Normocephalic.     Nose: Nose normal.  Eyes:     Pupils: Pupils are equal, round, and reactive to light.  Cardiovascular:     Rate and Rhythm: Normal rate.  Pulmonary:     Effort: Pulmonary effort is normal.  Musculoskeletal:        General: Normal range of motion.     Cervical back: Normal range of motion.  Neurological:     General: No focal deficit present.     Mental Status: She is alert.  Psychiatric:        Mood and Affect: Mood normal.        Behavior: Behavior normal.        Thought Content: Thought content normal.        Judgment: Judgment normal.    Review of Systems  Constitutional: Negative.   HENT: Negative.    Eyes: Negative.   Respiratory: Negative.    Cardiovascular: Negative.   Gastrointestinal: Negative.   Genitourinary: Negative.   Musculoskeletal: Negative.   Skin: Negative.   Neurological: Negative.   Psychiatric/Behavioral:  Positive for depression and suicidal ideas. The patient is nervous/anxious.     Blood pressure (!) 124/91, pulse 85, temperature 98.9 F (37.2 C), temperature source Oral, resp. rate 18, last menstrual period 09/22/2023, SpO2 98%. There is no height or weight on file to calculate BMI.  Past Psychiatric History: Bipolar  disorder, MDD, GAD, PTSD  Is the patient at risk to self? Yes  Has the patient been a risk to self in the past 6 months? Yes .    Has the patient been a risk to self within the distant past? Yes   Is the patient a risk to others? No   Has the patient been a risk to others in the past 6 months? No   Has the patient been a risk to others within the distant past? No   Past Medical History: See chart  Family History: Unknown  Social History: Marijuana  Last Labs:  Admission on 10/17/2023  Component Date Value Ref Range Status   WBC 10/17/2023 11.0 (H)  4.0 - 10.5 K/uL Final   RBC 10/17/2023 4.99  3.87 - 5.11 MIL/uL Final   Hemoglobin 10/17/2023 13.9  12.0 - 15.0 g/dL Final   HCT 90/95/7974 42.7  36.0 - 46.0 % Final  MCV 10/17/2023 85.6  80.0 - 100.0 fL Final   MCH 10/17/2023 27.9  26.0 - 34.0 pg Final   MCHC 10/17/2023 32.6  30.0 - 36.0 g/dL Final   RDW 90/95/7974 19.6 (H)  11.5 - 15.5 % Final   Platelets 10/17/2023 322  150 - 400 K/uL Final   nRBC 10/17/2023 0.0  0.0 - 0.2 % Final   Neutrophils Relative % 10/17/2023 54  % Final   Neutro Abs 10/17/2023 6.0  1.7 - 7.7 K/uL Final   Lymphocytes Relative 10/17/2023 34  % Final   Lymphs Abs 10/17/2023 3.7  0.7 - 4.0 K/uL Final   Monocytes Relative 10/17/2023 8  % Final   Monocytes Absolute 10/17/2023 0.8  0.1 - 1.0 K/uL Final   Eosinophils Relative 10/17/2023 3  % Final   Eosinophils Absolute 10/17/2023 0.4  0.0 - 0.5 K/uL Final   Basophils Relative 10/17/2023 1  % Final   Basophils Absolute 10/17/2023 0.1  0.0 - 0.1 K/uL Final   Immature Granulocytes 10/17/2023 0  % Final   Abs Immature Granulocytes 10/17/2023 0.04  0.00 - 0.07 K/uL Final   Performed at [facility], [address], [place], [place] [address]   Sodium 10/17/2023 134 (L)  135 - 145 mmol/L Final   Potassium 10/17/2023 3.6  3.5 - 5.1 mmol/L Final   Chloride 10/17/2023 98  98 - 111 mmol/L Final   CO2 10/17/2023 25  22 - 32 mmol/L Final   Glucose, Bld  10/17/2023 108 (H)  70 - 99 mg/dL Final   Glucose reference range applies only to samples taken after fasting for at least 8 hours.   BUN 10/17/2023 7  6 - 20 mg/dL Final   Creatinine, Ser 10/17/2023 0.76  0.44 - 1.00 mg/dL Final   Calcium  10/17/2023 9.4  8.9 - 10.3 mg/dL Final   Total Protein 90/95/7974 7.1  6.5 - 8.1 g/dL Final   Albumin 90/95/7974 4.4  3.5 - 5.0 g/dL Final   AST 90/95/7974 26  15 - 41 U/L Final   ALT 10/17/2023 33  0 - 44 U/L Final   Alkaline Phosphatase 10/17/2023 58  38 - 126 U/L Final   Total Bilirubin 10/17/2023 0.5  0.0 - 1.2 mg/dL Final   GFR, Estimated 10/17/2023 >60  >60 mL/min Final   Comment: (NOTE) Calculated using the CKD-EPI Creatinine Equation (2021)    Anion gap 10/17/2023 11  5 - 15 Final   Performed at [facility], [address], [place], [place] [address]   Alcohol, Ethyl (B) 10/17/2023 <15  <15 mg/dL Final   Comment: (NOTE) For medical purposes only. Performed at [facility], [address] [place], [place] [address]    TSH 10/17/2023 1.755  0.350 - 4.500 uIU/mL Final   Comment: Performed by a 3rd Generation assay with a functional sensitivity of <=0.01 uIU/mL. Performed at [facility], [address] [place], [place] [address]    Preg Test, Ur 10/17/2023 Negative  Negative Final   POC Amphetamine UR 10/17/2023 None Detected  NONE DETECTED (Cut Off Level 1000 ng/mL) Final   POC Secobarbital (BAR) 10/17/2023 None Detected  NONE DETECTED (Cut Off Level 300 ng/mL) Final   POC Buprenorphine (BUP) 10/17/2023 None Detected  NONE DETECTED (Cut Off Level 10 ng/mL) Final   POC Oxazepam (BZO) 10/17/2023 None Detected  NONE DETECTED (Cut Off Level 300 ng/mL) Final   POC Cocaine UR 10/17/2023 None Detected  NONE DETECTED (Cut Off  Level 300 ng/mL) Final   POC Methamphetamine UR 10/17/2023 None Detected  NONE DETECTED (Cut Off Level 1000 ng/mL) Final   POC Morphine  10/17/2023 Positive (A)  NONE DETECTED (Cut Off Level 300 ng/mL) Final    POC Methadone UR 10/17/2023 None Detected  NONE DETECTED (Cut Off Level 300 ng/mL) Final   POC Oxycodone  UR 10/17/2023 None Detected  NONE DETECTED (Cut Off Level 100 ng/mL) Final   POC Marijuana UR 10/17/2023 Positive (A)  NONE DETECTED (Cut Off Level 50 ng/mL) Final  Office Visit on 10/16/2023  Component Date Value Ref Range Status   Neisseria Gonorrhea 10/16/2023 Negative   Final   Chlamydia 10/16/2023 Negative   Final   Trichomonas 10/16/2023 Negative   Final   Bacterial Vaginitis (gardnerella) 10/16/2023 Negative   Final   Candida Vaginitis 10/16/2023 Negative   Final   Candida Glabrata 10/16/2023 Negative   Final   Comment 10/16/2023 Normal Reference Range Bacterial Vaginosis - Negative   Final   Comment 10/16/2023 Normal Reference Range Candida Species - Negative   Final   Comment 10/16/2023 Normal Reference Range Candida Galbrata - Negative   Final   Comment 10/16/2023 Normal Reference Range Trichomonas - Negative   Final   Comment 10/16/2023 Normal Reference Ranger Chlamydia - Negative   Final   Comment 10/16/2023 Normal Reference Range Neisseria Gonorrhea - Negative   Final  Appointment on 07/22/2023  Component Date Value Ref Range Status   Ferritin 07/22/2023 6 (L)  11 - 307 ng/mL Final   Performed at [facility], [address], [place], [place] [address]   Iron 07/22/2023 27 (L)  28 - 170 ug/dL Final   TIBC 93/90/7974 482 (H)  250 - 450 ug/dL Final   Saturation Ratios 07/22/2023 6 (L)  10.4 - 31.8 % Final   UIBC 07/22/2023 455  ug/dL Final   Performed at [facility], [address], [place], [place] [address]   Sodium 07/22/2023 138  135 - 145 mmol/L Final   Potassium 07/22/2023 4.0  3.5 - 5.1 mmol/L Final   Chloride 07/22/2023 105  98 - 111 mmol/L Final   CO2 07/22/2023 21 (L)  22 - 32 mmol/L Final   Glucose, Bld 07/22/2023 116 (H)  70 - 99 mg/dL Final   Glucose reference range applies only to samples taken after fasting for at least 8  hours.   BUN 07/22/2023 12  6 - 20 mg/dL Final   Creatinine 93/90/7974 0.71  0.44 - 1.00 mg/dL Final   Calcium  07/22/2023 9.4  8.9 - 10.3 mg/dL Final   Total Protein 93/90/7974 6.8  6.5 - 8.1 g/dL Final   Albumin 93/90/7974 4.2  3.5 - 5.0 g/dL Final   AST 93/90/7974 23  15 - 41 U/L Final   ALT 07/22/2023 22  0 - 44 U/L Final   Alkaline Phosphatase 07/22/2023 61  38 - 126 U/L Final   Total Bilirubin 07/22/2023 0.2  0.0 - 1.2 mg/dL Final   GFR, Estimated 07/22/2023 >60  >60 mL/min Final   Comment: (NOTE) Calculated using the CKD-EPI Creatinine Equation (2021)    Anion gap 07/22/2023 13  5 - 15 Final   Performed at [facility], [address] [place], [place] [address]   WBC Count 07/22/2023 11.3 (H)  4.0 - 10.5 K/uL Final   RBC 07/22/2023 4.34  3.87 - 5.11 MIL/uL Final   Hemoglobin 07/22/2023 9.9 (L)  12.0 - 15.0 g/dL Final   Comment: Reticulocyte Hemoglobin testing  may be clinically indicated, consider ordering this additional test [id number]    HCT 07/22/2023 32.2 (L)  36.0 - 46.0 % Final   MCV 07/22/2023 74.2 (L)  80.0 - 100.0 fL Final   MCH 07/22/2023 22.8 (L)  26.0 - 34.0 pg Final   MCHC 07/22/2023 30.7  30.0 - 36.0 g/dL Final   RDW 93/90/7974 15.9 (H)  11.5 - 15.5 % Final   Platelet Count 07/22/2023 278  150 - 400 K/uL Final   nRBC 07/22/2023 0.0  0.0 - 0.2 % Final   Neutrophils Relative % 07/22/2023 70  % Final   Neutro Abs 07/22/2023 7.9 (H)  1.7 - 7.7 K/uL Final   Lymphocytes Relative 07/22/2023 22  % Final   Lymphs Abs 07/22/2023 2.5  0.7 - 4.0 K/uL Final   Monocytes Relative 07/22/2023 7  % Final   Monocytes Absolute 07/22/2023 0.8  0.1 - 1.0 K/uL Final   Eosinophils Relative 07/22/2023 1  % Final   Eosinophils Absolute 07/22/2023 0.1  0.0 - 0.5 K/uL Final   Basophils Relative 07/22/2023 0  % Final   Basophils Absolute 07/22/2023 0.0  0.0 - 0.1 K/uL Final   Immature Granulocytes 07/22/2023 0  % Final   Abs Immature Granulocytes 07/22/2023 0.04   0.00 - 0.07 K/uL Final   Performed at [facility], [address], [place], [place] [address]  Orders Only on 06/27/2023  Component Date Value Ref Range Status   Glucose, Bld 06/27/2023 106 (H)  65 - 99 mg/dL Final   Comment: .            Fasting reference interval . For someone without known diabetes, a glucose value between 100 and 125 mg/dL is consistent with prediabetes and should be confirmed with a follow-up test. .    BUN 06/27/2023 14  7 - 25 mg/dL Final   Creat 94/84/7974 0.73  0.50 - 0.99 mg/dL Final   eGFR 94/84/7974 102  > OR = 60 mL/min/1.39m2 Final   BUN/Creatinine Ratio 06/27/2023 SEE NOTE:  6 - 22 (calc) Final   Comment:    Not Reported: BUN and Creatinine are within    reference range. .    Sodium 06/27/2023 137  135 - 146 mmol/L Final   Potassium 06/27/2023 4.3  3.5 - 5.3 mmol/L Final   Chloride 06/27/2023 104  98 - 110 mmol/L Final   CO2 06/27/2023 23  20 - 32 mmol/L Final   Calcium  06/27/2023 9.5  8.6 - 10.2 mg/dL Final   Total Protein 94/84/7974 7.1  6.1 - 8.1 g/dL Final   Albumin 94/84/7974 4.5  3.6 - 5.1 g/dL Final   Globulin 94/84/7974 2.6  1.9 - 3.7 g/dL (calc) Final   AG Ratio 06/27/2023 1.7  1.0 - 2.5 (calc) Final   Total Bilirubin 06/27/2023 0.3  0.2 - 1.2 mg/dL Final   Alkaline phosphatase (APISO) 06/27/2023 68  31 - 125 U/L Final   AST 06/27/2023 14  10 - 35 U/L Final   ALT 06/27/2023 12  6 - 29 U/L Final   CRP 06/27/2023 <3.0  <8.0 mg/L Final   Folate 06/27/2023 13.6  ng/mL Final   Comment:                            Reference Range  Low:           <3.4                            Borderline:    3.4-5.4                            Normal:        >5.4 .    Cholesterol 06/27/2023 136  <200 mg/dL Final   HDL 94/84/7974 52  > OR = 50 mg/dL Final   Triglycerides 94/84/7974 70  <150 mg/dL Final   LDL Cholesterol (Calc) 06/27/2023 69  mg/dL (calc) Final   Comment: Reference range: <100 . Desirable  range <100 mg/dL for primary prevention;   <70 mg/dL for patients with CHD or diabetic patients  with > or = 2 CHD risk factors. SABRA LDL-C is now calculated using the Martin-Hopkins  calculation, which is a validated novel method providing  better accuracy than the Friedewald equation in the  estimation of LDL-C.  Gladis APPLETHWAITE et al. SANDREA. 7986;689(80): 2061-2068  (http://education.[facility].com/faq/FAQ164)    Total CHOL/HDL Ratio 06/27/2023 2.6  <4.9 (calc) Final   Non-HDL Cholesterol (Calc) 06/27/2023 84  <130 mg/dL (calc) Final   Comment: For patients with diabetes plus 1 major ASCVD risk  factor, treating to a non-HDL-C goal of <100 mg/dL  (LDL-C of <29 mg/dL) is considered a therapeutic  option.    TSH 06/27/2023 0.88  mIU/L Final   Comment:           Reference Range .           > or = 20 Years  0.40-4.50 .                Pregnancy Ranges           First trimester    0.26-2.66           Second trimester   0.55-2.73           Third trimester    0.43-2.91    Vit D, 25-Hydroxy 06/27/2023 38  30 - 100 ng/mL Final   Comment: Vitamin D  Status         25-OH Vitamin D : . Deficiency:                    <20 ng/mL Insufficiency:             20 - 29 ng/mL Optimal:                 > or = 30 ng/mL . For 25-OH Vitamin D  testing on patients on  D2-supplementation and patients for whom quantitation  of D2 and D3 fractions is required, the QuestAssureD(TM) 25-OH VIT D, (D2,D3), LC/MS/MS is recommended: order  code 07111 (patients >34yrs). . See Note 1 . Note 1 . For additional information, please refer to  http://education.[facility].com/faq/FAQ199  (This link is being provided for informational/ educational purposes only.)    Magnesium  06/27/2023 2.3  1.5 - 2.5 mg/dL Final   Vitamin B-12 94/84/7974 458  200 - 1,100 pg/mL Final  Office Visit on 06/27/2023  Component Date Value Ref Range Status   Rheumatoid fact SerPl-aCnc 06/27/2023 <10  <14 IU/mL Final   Cyclic  Citrullin Peptide Ab 06/27/2023 <16  UNITS Final   Comment: Reference Range Negative:            <20 Weak Positive:  20-39 Moderate Positive:   40-59 Strong Positive:     >59 .    dsDNA Ab 06/27/2023 1  0 - 9 IU/mL Final   Comment:                                    Negative      <5                                    Equivocal  5 - 9                                    Positive      >9    ENA RNP Ab 06/27/2023 <0.2  0.0 - 0.9 AI Final   ENA SM Ab Ser-aCnc 06/27/2023 <0.2  0.0 - 0.9 AI Final   Scleroderma (Scl-70) (ENA) Antibod* 06/27/2023 <0.2  0.0 - 0.9 AI Final   ENA SSA (RO) Ab 06/27/2023 <0.2  0.0 - 0.9 AI Final   ENA SSB (LA) Ab 06/27/2023 <0.2  0.0 - 0.9 AI Final   Chromatin Ab SerPl-aCnc 06/27/2023 <0.2  0.0 - 0.9 AI Final   Anti JO-1 06/27/2023 <0.2  0.0 - 0.9 AI Final   Centromere Ab Screen 06/27/2023 <0.2  0.0 - 0.9 AI Final   See below: 06/27/2023 Comment   Final   Comment: Autoantibody                       Disease Association ------------------------------------------------------------                         Condition                  Frequency ---------------------   ------------------------   --------- Antinuclear Antibody,    SLE, mixed connective Direct (ANA-D)           tissue diseases ---------------------   ------------------------   --------- dsDNA                    SLE                        40 - 60% ---------------------   ------------------------   --------- Chromatin                Drug induced SLE                90%                          SLE                        48 - 97% ---------------------   ------------------------   --------- SSA (Ro)                 SLE                        25 - 35%                          Sjogren's Syndrome  40 - 70%                          Neonatal Lupus                 100% ---------------------   ------------------------   --------- SSB (La)                 SLE                                                        10%                          Sjogren's Syndrome              30% ---------------------   -----------------------    --------- Sm (anti-Smith)          SLE                        15 - 30% ---------------------   -----------------------    --------- RNP                      Mixed Connective Tissue                          Disease                         95% (U1 nRNP,                SLE                        30 - 50% anti-ribonucleoprotein)  Polymyositis and/or                          Dermatomyositis                 20% ---------------------   ------------------------   --------- Scl-70 (antiDNA          Scleroderma (diffuse)      20 - 35% topoisomerase)           Crest                           13% ---------------------   ------------------------   --------- Jo-1                     Polymyositis and/or                          Dermatomyositis            20 - 40% ---------------------   ------------------------   --------- Centromere B             Scleroderma -                           Crest  variant                         80%    C3 Complement 06/27/2023 CANCELED   Final   Comment: TEST NOT PERFORMED . No suitable specimen received. Please review the test requirements at  [email or web address]  Result canceled by the ancillary.    Complement C1Q 06/27/2023 5.3  5.0 - 8.6 mg/dL Final   Comment: . Low levels of C1q indicate either increased consumption (catabolism) or decreased synthesis. . This test was developed and its analytical performance characteristics have been determined by [employer]. It has not been cleared or approved by the FDA. This assay has been validated pursuant to the CLIA regulations and is used for clinical purposes.    HLA-B27 Antigen 06/27/2023 NEGATIVE  NEGATIVE Final   Sed Rate 06/27/2023 18  0 - 20 mm/hr Final   Hgb A1c MFr Bld 06/27/2023 6.1  4.6 - 6.5 % Final   Glycemic Control Guidelines  for People with Diabetes:Non Diabetic:  <6%Goal of Therapy: <7%Additional Action Suggested:  >8%    WBC 06/27/2023 7.5  4.0 - 10.5 K/uL Final   RBC 06/27/2023 4.95  3.87 - 5.11 Mil/uL Final   Hemoglobin 06/27/2023 11.3 (L)  12.0 - 15.0 g/dL Final   HCT 94/84/7974 35.1 (L)  36.0 - 46.0 % Final   MCV 06/27/2023 70.9 (L)  78.0 - 100.0 fl Final   MCHC 06/27/2023 32.1  30.0 - 36.0 g/dL Final   RDW 94/84/7974 16.4 (H)  11.5 - 15.5 % Final   Platelets 06/27/2023 292.0  150.0 - 400.0 K/uL Final   Neutrophils Relative % 06/27/2023 64.6  43.0 - 77.0 % Final   Lymphocytes Relative 06/27/2023 24.2  12.0 - 46.0 % Final   Monocytes Relative 06/27/2023 8.5  3.0 - 12.0 % Final   Eosinophils Relative 06/27/2023 2.3  0.0 - 5.0 % Final   Basophils Relative 06/27/2023 0.4  0.0 - 3.0 % Final   Neutro Abs 06/27/2023 4.9  1.4 - 7.7 K/uL Final   Lymphs Abs 06/27/2023 1.8  0.7 - 4.0 K/uL Final   Monocytes Absolute 06/27/2023 0.6  0.1 - 1.0 K/uL Final   Eosinophils Absolute 06/27/2023 0.2  0.0 - 0.7 K/uL Final   Basophils Absolute 06/27/2023 0.0  0.0 - 0.1 K/uL Final   Vitamin B1 (Thiamine ) 06/27/2023 8  8 - 30 nmol/L Final   Comment: (Note) Vitamin supplementation within 24 hours prior to blood  draw may affect the accuracy of the results. . This test was developed and its analytical performance  characteristics have been determined by [employer]. It has not been cleared or approved by FDA.  This assay has been validated pursuant to the CLIA  regulations and is used for clinical purposes. . [facility] [address] [place] [id number] [phone] Johanna Agent L. Frame, MD, PhD    Iron 06/27/2023 18 (L)  40 - 190 mcg/dL Final   TIBC 94/84/7974 494 (H)  250 - 450 mcg/dL (calc) Final   %SAT 94/84/7974 4 (L)  16 - 45 % (calc) Final   Ferritin 06/27/2023 4 (L)  16 - 232 ng/mL Final  Appointment on 05/07/2023  Component Date Value Ref Range Status   Rest HR  05/07/2023 76.0  bpm Final   Rest BP 05/07/2023 117/57  mmHg Final   Exercise duration (min) 05/07/2023 6  min Final   Exercise duration (sec) 05/07/2023 18  sec Final   Estimated workload 05/07/2023 7.4   Final   Peak HR 05/07/2023 150  bpm Final   Peak BP 05/07/2023 199/82  mmHg Final   MPHR 05/07/2023 173  bpm Final   Percent HR 05/07/2023 86.0  % Final   Base ST Depression (mm) 05/07/2023 0  mm Final   ST Depression (mm) 05/07/2023 0  mm Final  Office Visit on 04/18/2023  Component Date Value Ref Range Status   Hepatitis B Surface Ag 04/18/2023 NON-REACTIVE  NON-REACTIVE Final   Comment: . For additional information, please refer to  http://education.[facility].com/faq/FAQ202  (This link is being provided for informational/ educational purposes only.) .    SURESWAB(R) ADV BACTERIAL VAGINOSI* 04/18/2023 NEGATIVE  NEGATIVE Final   CANDIDA SPECIES 04/18/2023 NOT DETECTED  NOT DETECTED Final   Candida glabrata 04/18/2023 NOT DETECTED  NOT DETECTED Final   TRICHOMONAS VAGINALIS (TV),TMA 04/18/2023 NOT DETECTED  NOT DETECTED Final   C. trachomatis RNA, TMA 04/18/2023 NOT DETECTED  NOT DETECTED Final   N. gonorrhoeae RNA, TMA 04/18/2023 NOT DETECTED  NOT DETECTED Final   Comment: . Candida species C. albicans, C. tropicalis, C. parapsilosis, and/or C. dubliniensis can be detected, but not differentiated, in the Candida spp. result.  For additional information, please refer to https://education.[facility].com/faq/FAQ154 (This link is being provided for information/ educational purposes only.)    HIV 1&2 Ab, 4th Generation 04/18/2023 NON-REACTIVE  NON-REACTIVE Final   Comment: HIV-1 antigen and HIV-1/HIV-2 antibodies were not detected. There is no laboratory evidence of HIV infection. SABRA PLEASE NOTE: This information has been disclosed to you from records whose confidentiality may be protected by state law.  If your state requires such protection, then the state law  prohibits you from making any further disclosure of the information without the specific written consent of the person to whom it pertains, or as otherwise permitted by law. A general authorization for the release of medical or other information is NOT sufficient for this purpose. . For additional information please refer to http://education.[facility].com/faq/FAQ106 (This link is being provided for informational/ educational purposes only.) . SABRA The performance of this assay has not been clinically validated in patients less than 74 years old. .    RPR Ser Ql 04/18/2023 NON-REACTIVE  NON-REACTIVE Final   Comment: . No laboratory evidence of syphilis. If recent exposure is suspected, submit a new sample in 2-4 weeks. .    Hepatitis C Ab 04/18/2023 NON-REACTIVE  NON-REACTIVE Final   Comment: . HCV antibody was non-reactive. There is no laboratory  evidence of HCV infection. . In most cases, no further action is required. However, if recent HCV exposure is suspected, a test for HCV RNA (test code 64354) is suggested. . For additional information please refer to http://education.[facility].com/faq/FAQ22v1 (This link is being provided for informational/ educational purposes only.) .    [facility] [place] 04/18/2023 8.9  mIU/mL Final   Comment:                     Reference Range .              Follicular Phase       2.5-10.2              Mid-cycle Peak         3.1-17.7              Luteal Phase           1.5- 9.1  Postmenopausal       23.0-116.3              .    Estradiol  04/18/2023 36  pg/mL Final   Comment:       Reference Range         Follicular Phase:    19-144         Mid-Cycle:           64-357         Luteal Phase:        43-785         Postmenopausal:      < or = 31 . Reference range established on post-pubertal patient population. No pre-pubertal reference range established using this assay. For any patients for whom low Estradiol  levels are  anticipated (e.g. males, pre-pubertal children and hypogonadal/post-menopausal  females), the [facility] Estradiol , Ultrasensitive, LCMSMS assay is recommended (order code 69710). . Please note: patients being treated with the drug  fulvestrant (Faslodex(R)) have demonstrated significant  interference in immunoassay methods for estradiol   measurement. The cross reactivity could lead to falsely  elevated estradiol  test results leading to an  inappropriate clinical assessment of estrogen status. [facility] order code 30289-Estradiol ,  Ultrasensitive LC/MS/MS demonstrates negligible cross  re                          activity with fulvestrant.     Allergies: Ciprofloxacin and Wound dressing adhesive  Medications:  Facility Ordered Medications  Medication   acetaminophen  (TYLENOL ) tablet 650 mg   alum & mag hydroxide-simeth (MAALOX/MYLANTA) 200-200-20 MG/5ML suspension 30 mL   magnesium  hydroxide (MILK OF MAGNESIA) suspension 30 mL   haloperidol  (HALDOL ) tablet 5 mg   And   diphenhydrAMINE  (BENADRYL ) capsule 50 mg   OLANZapine  (ZYPREXA ) injection 5 mg   OLANZapine  (ZYPREXA ) injection 10 mg   [COMPLETED] hydrOXYzine  (ATARAX ) tablet 50 mg   traZODone  (DESYREL ) tablet 100 mg   [COMPLETED] HYDROcodone -acetaminophen  (NORCO/VICODIN) 5-325 MG per tablet 1 tablet   PTA Medications  Medication Sig   zolpidem  (AMBIEN ) 5 MG tablet Take 1 tablet (5 mg total) by mouth at bedtime as needed for sleep.   lumateperone tosylate (CAPLYTA) 42 MG capsule    pregabalin  (LYRICA ) 150 MG capsule TAKE 1 CAPSULE BY MOUTH TWICE A DAY   clonazePAM  (KLONOPIN ) 0.5 MG tablet Take 1 tablet (0.5 mg total) by mouth 2 (two) times daily as needed. for anxiety   AUVELITY 45-105 MG TBCR Take by mouth.   HYDROcodone -acetaminophen  (NORCO) 10-325 MG tablet Take 1 tablet by mouth every 6 (six) hours as needed.   azelastine  (ASTELIN ) 0.1 % nasal spray Place 2 sprays into both nostrils 2  (two) times daily.   tiZANidine  (ZANAFLEX ) 4 MG tablet Take 1 tablet (4 mg total) by mouth 3 (three) times daily.   QUEtiapine  (SEROQUEL ) 200 MG tablet Take 2 tablets by mouth at bedtime.   losartan  (COZAAR ) 50 MG tablet Take 1 tablet (50 mg total) by mouth daily.   Continuous Glucose Sensor (FREESTYLE LIBRE 3 PLUS SENSOR) MISC Change sensor every 15 days. (Patient not taking: Reported on 10/16/2023)   tirzepatide  (MOUNJARO ) 10 MG/0.5ML Pen Inject 10 mg into the skin once a week.   VENTOLIN  HFA 108 (90 Base) MCG/ACT inhaler INHALE 1-2 PUFFS BY MOUTH EVERY 6 HOURS AS NEEDED FOR WHEEZE OR SHORTNESS OF BREATH   pantoprazole  (PROTONIX ) 40 MG tablet Take 1 tablet (40 mg total) by mouth  daily.   rosuvastatin  (CRESTOR ) 5 MG tablet TAKE 1 TABLET (5 MG TOTAL) BY MOUTH DAILY.   SUMAtriptan  (IMITREX ) 100 MG tablet Take 1 tablet at earliest onset of migraine.  May repeat in 2 hours if headache persists or recurs.  Maximum 2 tablets in 24 hours.   botulinum toxin Type A  (BOTOX ) 200 units injection Inject 155 units IM into multiple site in the face,neck and head once every 90 days   methocarbamol  (ROBAXIN ) 750 MG tablet Take 1 tablet (750 mg total) by mouth 3 (three) times daily as needed.   ondansetron  (ZOFRAN -ODT) 4 MG disintegrating tablet TAKE 1 TABLET (4 MG TOTAL) BY MOUTH EVERY 6 (SIX) HOURS AS NEEDED FOR NAUSEA OR VOMITING   promethazine  (PHENERGAN ) 25 MG tablet TAKE 1 TABLET BY MOUTH EVERY 8 HOURS AS NEEDED FOR NAUSEA OR VOMITING.      Medical Decision Making  Inpatient unit, will hold in observation until a bed becomes available    Recommendations  Based on my evaluation the patient does not appear to have an emergency medical condition.  Gaither Pouch, NP 10/18/23  5:21 AM

## 2023-10-17 NOTE — ED Notes (Signed)
 Patient observed crying in the assessment room with family member. Verbal de-escalation and supportive listening offered. Patient given scheduled medication and educated on usage. Patient verbalized understanding and took scheduled meds.

## 2023-10-17 NOTE — Telephone Encounter (Signed)
 We referred to [facility] in June. Patient got COVID and missed many PT appts.   She called to reschedule and they require a new referral sent for this service.

## 2023-10-17 NOTE — ED Provider Notes (Incomplete)
 [facility]gent [facility] Continuous Assessment Admission H&P  Date: 10/17/23 Patient Name: Heidi Holland MRN: [id number] Chief Complaint: suicidal ideation   Diagnoses:  Final diagnoses:  Suicidal ideation  Difficulty coping    HPI: Heidi Holland, 48 y/o female with a history of bipolar dx, PTSD, GAD presented to [facility] companied by her sister.  Per the patient she has suicidal ideation with plans to overdose on her pills.  Cording to the patient she has been going through a lot and she just do not feel like anyone he wants her around.  Patient also reports that she is about to get evicted from her apartment.  Patient also went on to state that she lost a couple family members a couple years ago.  Patient can become very tearful when talking.  Patient stated that she lives alone, she currently works part-time at [employer].  According to her she does not have access to a gun.  Per the patient she tried cutting her wrist last week but she did not tell anyone.  An evaluation of patient wrist did not show any recent mark but she did have an old mark that looks healed from a long time ago.  Face-to-face evaluation of patient, patient is alert and oriented x 4, speech is clear, maintaining eye contact.  Patient is very tearful at times.  Patient endorsed suicidal ideation to overdose on pills.  Patient denies HI, AVH or paranoia.  Denies alcohol use.  Reports she smokes marijuana every now and then last time was Monday.  Denies ever being hospitalized for any psychiatric problems.  According to patient she is currently seeing a provider for med management in [place] [facility].  Patient went on to states she gets hydrocodone  every 6 hours and she gets Klonopin  and Ambien  as.  Writer discussed with patient that is not safe to give benzodiazepine and opioids together therefore I may be able to give her a one-time dose of pain medicine but I am not can to be able to schedule her on a every 6 hours hydrocodone  10  325 mg.  Patient also went on to say she gets Seroquel  600 mg at night to sleep with her Ambien .  Writer discussed with patient that she would need to talk to the provider tomorrow as far as her getting those high dosage of medication which includes benzos and opioids.  Patient does seem to be fixated on getting the medications, Clinical research associate discussed with patient that at this point it is about her safety we need to ensure her safety first and then we can think about medicines later on.  She continues to say that although she cannot do without these medicines because that is what she normally takes.  Patient does appear to be medication seeking at this point, Clinical research associate discussed with patient that we would need to reevaluate her medicines in the AM.  Given patient prior history and current presentation recommending inpatient admission when a bed becomes available  Total Time spent with patient: 20 minutes  Musculoskeletal  Strength & Muscle Tone: within normal limits Gait & Station: normal Patient leans: N/A  Psychiatric Specialty Exam  Presentation General Appearance:  Casual  Eye Contact: Good  Speech: Clear and Coherent  Speech Volume: Normal  Handedness: Right   Mood and Affect  Mood: Anxious; Depressed  Affect: Congruent   Thought Process  Thought Processes: Coherent  Descriptions of Associations:Intact  Orientation:Full (Time, Place and Person)  Thought Content:WDL    Hallucinations:Hallucinations: None  Ideas of Reference:None  Suicidal Thoughts:Suicidal Thoughts: Yes, Active SI Active Intent and/or Plan: With Intent; With Plan  Homicidal Thoughts:Homicidal Thoughts: No   Sensorium  Memory: Immediate Good  Judgment: Fair  Insight: Fair   Chartered certified accountant: Fair  Attention Span: Fair  Recall: [employer] of Knowledge: Fair  Language: Fair   Psychomotor Activity  Psychomotor Activity: Psychomotor Activity:  Normal   Assets  Assets: Desire for Improvement; Resilience   Sleep  Sleep: Sleep: Fair Number of Hours of Sleep: 6   Nutritional Assessment (For OBS and FBC admissions only) Has the patient had a weight loss or gain of 10 pounds or more in the last 3 months?: No Has the patient had a decrease in food intake/or appetite?: No Does the patient have dental problems?: No Does the patient have eating habits or behaviors that may be indicators of an eating disorder including binging or inducing vomiting?: No Has the patient recently lost weight without trying?: 0 Has the patient been eating poorly because of a decreased appetite?: 0 Malnutrition Screening Tool Score: 0    Physical Exam HENT:     Head: Normocephalic.     Nose: Nose normal.  Eyes:     Pupils: Pupils are equal, round, and reactive to light.  Cardiovascular:     Rate and Rhythm: Normal rate.  Pulmonary:     Effort: Pulmonary effort is normal.  Musculoskeletal:        General: Normal range of motion.     Cervical back: Normal range of motion.  Neurological:     General: No focal deficit present.     Mental Status: She is alert.  Psychiatric:        Mood and Affect: Mood normal.        Behavior: Behavior normal.        Thought Content: Thought content normal.        Judgment: Judgment normal.    Review of Systems  Constitutional: Negative.   HENT: Negative.    Eyes: Negative.   Respiratory: Negative.    Cardiovascular: Negative.   Gastrointestinal: Negative.   Genitourinary: Negative.   Musculoskeletal: Negative.   Skin: Negative.   Neurological: Negative.   Psychiatric/Behavioral:  Positive for depression and suicidal ideas. The patient is nervous/anxious.     Blood pressure (!) 124/91, pulse 85, temperature 98.9 F (37.2 C), temperature source Oral, resp. rate 18, last menstrual period 09/22/2023, SpO2 98%. There is no height or weight on file to calculate BMI.  Past Psychiatric History: Bipolar  disorder, MDD, GAD, PTSD  Is the patient at risk to self? Yes  Has the patient been a risk to self in the past 6 months? Yes .    Has the patient been a risk to self within the distant past? Yes   Is the patient a risk to others? No   Has the patient been a risk to others in the past 6 months? No   Has the patient been a risk to others within the distant past? No   Past Medical History: See chart  Family History: Unknown  Social History: Marijuana  Last Labs:  Office Visit on 10/16/2023  Component Date Value Ref Range Status  . Neisseria Gonorrhea 10/16/2023 Negative   Final  . Chlamydia 10/16/2023 Negative   Final  . Trichomonas 10/16/2023 Negative   Final  . Bacterial Vaginitis (gardnerella) 10/16/2023 Negative   Final  . Candida Vaginitis 10/16/2023 Negative   Final  .  Candida Glabrata 10/16/2023 Negative   Final  . Comment 10/16/2023 Normal Reference Range Bacterial Vaginosis - Negative   Final  . Comment 10/16/2023 Normal Reference Range Candida Species - Negative   Final  . Comment 10/16/2023 Normal Reference Range Candida Galbrata - Negative   Final  . Comment 10/16/2023 Normal Reference Range Trichomonas - Negative   Final  . Comment 10/16/2023 Normal Reference Ranger Chlamydia - Negative   Final  . Comment 10/16/2023 Normal Reference Range Neisseria Gonorrhea - Negative   Final  Appointment on 07/22/2023  Component Date Value Ref Range Status  . Ferritin 07/22/2023 6 (L)  11 - 307 ng/mL Final   Performed at [facility], [address] [place], [place] [address]  . Iron 07/22/2023 27 (L)  28 - 170 ug/dL Final  . TIBC 93/90/7974 482 (H)  250 - 450 ug/dL Final  . Saturation Ratios 07/22/2023 6 (L)  10.4 - 31.8 % Final  . UIBC 07/22/2023 455  ug/dL Final   Performed at [facility], [address], [place], [place] [address]  . Sodium 07/22/2023 138  135 - 145 mmol/L Final  . Potassium 07/22/2023 4.0  3.5 - 5.1 mmol/L Final  . Chloride  07/22/2023 105  98 - 111 mmol/L Final  . CO2 07/22/2023 21 (L)  22 - 32 mmol/L Final  . Glucose, Bld 07/22/2023 116 (H)  70 - 99 mg/dL Final   Glucose reference range applies only to samples taken after fasting for at least 8 hours.  . BUN 07/22/2023 12  6 - 20 mg/dL Final  . Creatinine 93/90/7974 0.71  0.44 - 1.00 mg/dL Final  . Calcium  07/22/2023 9.4  8.9 - 10.3 mg/dL Final  . Total Protein 07/22/2023 6.8  6.5 - 8.1 g/dL Final  . Albumin 93/90/7974 4.2  3.5 - 5.0 g/dL Final  . AST 93/90/7974 23  15 - 41 U/L Final  . ALT 07/22/2023 22  0 - 44 U/L Final  . Alkaline Phosphatase 07/22/2023 61  38 - 126 U/L Final  . Total Bilirubin 07/22/2023 0.2  0.0 - 1.2 mg/dL Final  . GFR, Estimated 07/22/2023 >60  >60 mL/min Final   Comment: (NOTE) Calculated using the CKD-EPI Creatinine Equation (2021)   . Anion gap 07/22/2023 13  5 - 15 Final   Performed at [facility], [address], [place], [place] [address]  . WBC Count 07/22/2023 11.3 (H)  4.0 - 10.5 K/uL Final  . RBC 07/22/2023 4.34  3.87 - 5.11 MIL/uL Final  . Hemoglobin 07/22/2023 9.9 (L)  12.0 - 15.0 g/dL Final   Comment: Reticulocyte Hemoglobin testing may be clinically indicated, consider ordering this additional test [id number]   . HCT 07/22/2023 32.2 (L)  36.0 - 46.0 % Final  . MCV 07/22/2023 74.2 (L)  80.0 - 100.0 fL Final  . MCH 07/22/2023 22.8 (L)  26.0 - 34.0 pg Final  . MCHC 07/22/2023 30.7  30.0 - 36.0 g/dL Final  . RDW 93/90/7974 15.9 (H)  11.5 - 15.5 % Final  . Platelet Count 07/22/2023 278  150 - 400 K/uL Final  . nRBC 07/22/2023 0.0  0.0 - 0.2 % Final  . Neutrophils Relative % 07/22/2023 70  % Final  . Neutro Abs 07/22/2023 7.9 (H)  1.7 - 7.7 K/uL Final  . Lymphocytes Relative 07/22/2023 22  % Final  . Lymphs Abs 07/22/2023 2.5  0.7 - 4.0 K/uL Final  . Monocytes Relative 07/22/2023 7  % Final  .  Monocytes Absolute 07/22/2023 0.8  0.1 - 1.0 K/uL Final  . Eosinophils Relative 07/22/2023 1  % Final   . Eosinophils Absolute 07/22/2023 0.1  0.0 - 0.5 K/uL Final  . Basophils Relative 07/22/2023 0  % Final  . Basophils Absolute 07/22/2023 0.0  0.0 - 0.1 K/uL Final  . Immature Granulocytes 07/22/2023 0  % Final  . Abs Immature Granulocytes 07/22/2023 0.04  0.00 - 0.07 K/uL Final   Performed at [facility], [address] [place], [place] [address]  Orders Only on 06/27/2023  Component Date Value Ref Range Status  . Glucose, Bld 06/27/2023 106 (H)  65 - 99 mg/dL Final   Comment: .            Fasting reference interval . For someone without known diabetes, a glucose value between 100 and 125 mg/dL is consistent with prediabetes and should be confirmed with a follow-up test. .   . BUN 06/27/2023 14  7 - 25 mg/dL Final  . Creat 94/84/7974 0.73  0.50 - 0.99 mg/dL Final  . eGFR 94/84/7974 102  > OR = 60 mL/min/1.26m2 Final  . BUN/Creatinine Ratio 06/27/2023 SEE NOTE:  6 - 22 (calc) Final   Comment:    Not Reported: BUN and Creatinine are within    reference range. .   . Sodium 06/27/2023 137  135 - 146 mmol/L Final  . Potassium 06/27/2023 4.3  3.5 - 5.3 mmol/L Final  . Chloride 06/27/2023 104  98 - 110 mmol/L Final  . CO2 06/27/2023 23  20 - 32 mmol/L Final  . Calcium  06/27/2023 9.5  8.6 - 10.2 mg/dL Final  . Total Protein 06/27/2023 7.1  6.1 - 8.1 g/dL Final  . Albumin 94/84/7974 4.5  3.6 - 5.1 g/dL Final  . Globulin 06/27/2023 2.6  1.9 - 3.7 g/dL (calc) Final  . AG Ratio 06/27/2023 1.7  1.0 - 2.5 (calc) Final  . Total Bilirubin 06/27/2023 0.3  0.2 - 1.2 mg/dL Final  . Alkaline phosphatase (APISO) 06/27/2023 68  31 - 125 U/L Final  . AST 06/27/2023 14  10 - 35 U/L Final  . ALT 06/27/2023 12  6 - 29 U/L Final  . CRP 06/27/2023 <3.0  <8.0 mg/L Final  . Folate 06/27/2023 13.6  ng/mL Final   Comment:                            Reference Range                            Low:           <3.4                            Borderline:    3.4-5.4                             Normal:        >5.4 .   SABRA Cholesterol 06/27/2023 136  <200 mg/dL Final  . HDL 94/84/7974 52  > OR = 50 mg/dL Final  . Triglycerides 06/27/2023 70  <150 mg/dL Final  . LDL Cholesterol (Calc) 06/27/2023 69  mg/dL (calc) Final   Comment: Reference range: <100 . Desirable range <100 mg/dL for primary prevention;   <70 mg/dL for patients with CHD or diabetic  patients  with > or = 2 CHD risk factors. SABRA LDL-C is now calculated using the Martin-Hopkins  calculation, which is a validated novel method providing  better accuracy than the Friedewald equation in the  estimation of LDL-C.  Gladis APPLETHWAITE et al. SANDREA. 7986;689(80): 2061-2068  (http://education.[facility].com/faq/FAQ164)   . Total CHOL/HDL Ratio 06/27/2023 2.6  <4.9 (calc) Final  . Non-HDL Cholesterol (Calc) 06/27/2023 84  <130 mg/dL (calc) Final   Comment: For patients with diabetes plus 1 major ASCVD risk  factor, treating to a non-HDL-C goal of <100 mg/dL  (LDL-C of <29 mg/dL) is considered a therapeutic  option.   . TSH 06/27/2023 0.88  mIU/L Final   Comment:           Reference Range .           > or = 20 Years  0.40-4.50 .                Pregnancy Ranges           First trimester    0.26-2.66           Second trimester   0.55-2.73           Third trimester    0.43-2.91   . Vit D, 25-Hydroxy 06/27/2023 38  30 - 100 ng/mL Final   Comment: Vitamin D  Status         25-OH Vitamin D : . Deficiency:                    <20 ng/mL Insufficiency:             20 - 29 ng/mL Optimal:                 > or = 30 ng/mL . For 25-OH Vitamin D  testing on patients on  D2-supplementation and patients for whom quantitation  of D2 and D3 fractions is required, the QuestAssureD(TM) 25-OH VIT D, (D2,D3), LC/MS/MS is recommended: order  code 07111 (patients >89yrs). . See Note 1 . Note 1 . For additional information, please refer to  http://education.[facility].com/faq/FAQ199  (This link is being provided for  informational/ educational purposes only.)   . Magnesium  06/27/2023 2.3  1.5 - 2.5 mg/dL Final  . Vitamin B-12 94/84/7974 458  200 - 1,100 pg/mL Final  Office Visit on 06/27/2023  Component Date Value Ref Range Status  . Rheumatoid fact SerPl-aCnc 06/27/2023 <10  <14 IU/mL Final  . Cyclic Citrullin Peptide Ab 94/84/7974 <16  UNITS Final   Comment: Reference Range Negative:            <20 Weak Positive:       20-39 Moderate Positive:   40-59 Strong Positive:     >59 .   SABRA dsDNA Ab 06/27/2023 1  0 - 9 IU/mL Final   Comment:                                    Negative      <5                                    Equivocal  5 - 9  Positive      >9   . ENA RNP Ab 06/27/2023 <0.2  0.0 - 0.9 AI Final  . ENA SM Ab Ser-aCnc 06/27/2023 <0.2  0.0 - 0.9 AI Final  . Scleroderma (Scl-70) (ENA) Antibod* 06/27/2023 <0.2  0.0 - 0.9 AI Final  . ENA SSA (RO) Ab 06/27/2023 <0.2  0.0 - 0.9 AI Final  . ENA SSB (LA) Ab 06/27/2023 <0.2  0.0 - 0.9 AI Final  . Chromatin Ab SerPl-aCnc 06/27/2023 <0.2  0.0 - 0.9 AI Final  . Anti JO-1 06/27/2023 <0.2  0.0 - 0.9 AI Final  . Centromere Ab Screen 06/27/2023 <0.2  0.0 - 0.9 AI Final  . See below: 06/27/2023 Comment   Final   Comment: Autoantibody                       Disease Association ------------------------------------------------------------                         Condition                  Frequency ---------------------   ------------------------   --------- Antinuclear Antibody,    SLE, mixed connective Direct (ANA-D)           tissue diseases ---------------------   ------------------------   --------- dsDNA                    SLE                        40 - 60% ---------------------   ------------------------   --------- Chromatin                Drug induced SLE                90%                          SLE                        48 - 97% ---------------------   ------------------------   --------- SSA (Ro)                  SLE                        25 - 35%                          Sjogren's Syndrome         40 - 70%                          Neonatal Lupus                 100% ---------------------   ------------------------   --------- SSB (La)                 SLE                                                       10%  Sjogren's Syndrome              30% ---------------------   -----------------------    --------- Sm (anti-Smith)          SLE                        15 - 30% ---------------------   -----------------------    --------- RNP                      Mixed Connective Tissue                          Disease                         95% (U1 nRNP,                SLE                        30 - 50% anti-ribonucleoprotein)  Polymyositis and/or                          Dermatomyositis                 20% ---------------------   ------------------------   --------- Scl-70 (antiDNA          Scleroderma (diffuse)      20 - 35% topoisomerase)           Crest                           13% ---------------------   ------------------------   --------- Jo-1                     Polymyositis and/or                          Dermatomyositis            20 - 40% ---------------------   ------------------------   --------- Centromere B             Scleroderma -                           Crest                          variant                         80%   . C3 Complement 06/27/2023 CANCELED   Final   Comment: TEST NOT PERFORMED . No suitable specimen received. Please review the test requirements at  [email or web address]  Result canceled by the ancillary.   . Complement C1Q 06/27/2023 5.3  5.0 - 8.6 mg/dL Final   Comment: . Low levels of C1q indicate either increased consumption (catabolism) or decreased synthesis. . This test was developed and its analytical performance characteristics have been determined by [employer]. It has not been cleared or  approved by the FDA. This assay has been validated pursuant to the CLIA regulations and is used for clinical purposes.   SABRA HLA-B27 Antigen 06/27/2023 NEGATIVE  NEGATIVE Final  . Sed Rate 06/27/2023 18  0 -  20 mm/hr Final  . Hgb A1c MFr Bld 06/27/2023 6.1  4.6 - 6.5 % Final   Glycemic Control Guidelines for People with Diabetes:Non Diabetic:  <6%Goal of Therapy: <7%Additional Action Suggested:  >8%   . WBC 06/27/2023 7.5  4.0 - 10.5 K/uL Final  . RBC 06/27/2023 4.95  3.87 - 5.11 Mil/uL Final  . Hemoglobin 06/27/2023 11.3 (L)  12.0 - 15.0 g/dL Final  . HCT 94/84/7974 35.1 (L)  36.0 - 46.0 % Final  . MCV 06/27/2023 70.9 (L)  78.0 - 100.0 fl Final  . MCHC 06/27/2023 32.1  30.0 - 36.0 g/dL Final  . RDW 94/84/7974 16.4 (H)  11.5 - 15.5 % Final  . Platelets 06/27/2023 292.0  150.0 - 400.0 K/uL Final  . Neutrophils Relative % 06/27/2023 64.6  43.0 - 77.0 % Final  . Lymphocytes Relative 06/27/2023 24.2  12.0 - 46.0 % Final  . Monocytes Relative 06/27/2023 8.5  3.0 - 12.0 % Final  . Eosinophils Relative 06/27/2023 2.3  0.0 - 5.0 % Final  . Basophils Relative 06/27/2023 0.4  0.0 - 3.0 % Final  . Neutro Abs 06/27/2023 4.9  1.4 - 7.7 K/uL Final  . Lymphs Abs 06/27/2023 1.8  0.7 - 4.0 K/uL Final  . Monocytes Absolute 06/27/2023 0.6  0.1 - 1.0 K/uL Final  . Eosinophils Absolute 06/27/2023 0.2  0.0 - 0.7 K/uL Final  . Basophils Absolute 06/27/2023 0.0  0.0 - 0.1 K/uL Final  . Vitamin B1 (Thiamine ) 06/27/2023 8  8 - 30 nmol/L Final   Comment: (Note) Vitamin supplementation within 24 hours prior to blood  draw may affect the accuracy of the results. . This test was developed and its analytical performance  characteristics have been determined by [employer]. It has not been cleared or approved by FDA.  This assay has been validated pursuant to the CLIA  regulations and is used for clinical purposes. . [facility] [address] [place]  [id number] Johanna Agent L. Frame, MD, PhD   . Iron 06/27/2023 18 (L)  40 - 190 mcg/dL Final  . TIBC 94/84/7974 494 (H)  250 - 450 mcg/dL (calc) Final  . %SAT 94/84/7974 4 (L)  16 - 45 % (calc) Final  . Ferritin 06/27/2023 4 (L)  16 - 232 ng/mL Final  Appointment on 05/07/2023  Component Date Value Ref Range Status  . Rest HR 05/07/2023 76.0  bpm Final  . Rest BP 05/07/2023 117/57  mmHg Final  . Exercise duration (min) 05/07/2023 6  min Final  . Exercise duration (sec) 05/07/2023 18  sec Final  . Estimated workload 05/07/2023 7.4   Final  . Peak HR 05/07/2023 150  bpm Final  . Peak BP 05/07/2023 199/82  mmHg Final  . MPHR 05/07/2023 173  bpm Final  . Percent HR 05/07/2023 86.0  % Final  . Base ST Depression (mm) 05/07/2023 0  mm Final  . ST Depression (mm) 05/07/2023 0  mm Final  Office Visit on 04/18/2023  Component Date Value Ref Range Status  . Hepatitis B Surface Ag 04/18/2023 NON-REACTIVE  NON-REACTIVE Final   Comment: . For additional information, please refer to  http://education.[facility].com/faq/FAQ202  (This link is being provided for informational/ educational purposes only.) .   SABRA SURESWAB(R) ADV BACTERIAL VAGINOSI* 04/18/2023 NEGATIVE  NEGATIVE Final  . CANDIDA SPECIES 04/18/2023 NOT DETECTED  NOT DETECTED Final  . Candida glabrata 04/18/2023 NOT DETECTED  NOT DETECTED Final  . TRICHOMONAS VAGINALIS (  TV),TMA 04/18/2023 NOT DETECTED  NOT DETECTED Final  . C. trachomatis RNA, TMA 04/18/2023 NOT DETECTED  NOT DETECTED Final  . N. gonorrhoeae RNA, TMA 04/18/2023 NOT DETECTED  NOT DETECTED Final   Comment: . Candida species C. albicans, C. tropicalis, C. parapsilosis, and/or C. dubliniensis can be detected, but not differentiated, in the Candida spp. result.  For additional information, please refer to https://education.[facility].com/faq/FAQ154 (This link is being provided for information/ educational purposes only.)   . HIV 1&2 Ab, 4th  Generation 04/18/2023 NON-REACTIVE  NON-REACTIVE Final   Comment: HIV-1 antigen and HIV-1/HIV-2 antibodies were not detected. There is no laboratory evidence of HIV infection. SABRA PLEASE NOTE: This information has been disclosed to you from records whose confidentiality may be protected by state law.  If your state requires such protection, then the state law prohibits you from making any further disclosure of the information without the specific written consent of the person to whom it pertains, or as otherwise permitted by law. A general authorization for the release of medical or other information is NOT sufficient for this purpose. . For additional information please refer to http://education.[facility].com/faq/FAQ106 (This link is being provided for informational/ educational purposes only.) . SABRA The performance of this assay has not been clinically validated in patients less than 57 years old. .   . RPR Ser Ql 04/18/2023 NON-REACTIVE  NON-REACTIVE Final   Comment: . No laboratory evidence of syphilis. If recent exposure is suspected, submit a new sample in 2-4 weeks. .   . Hepatitis C Ab 04/18/2023 NON-REACTIVE  NON-REACTIVE Final   Comment: . HCV antibody was non-reactive. There is no laboratory  evidence of HCV infection. . In most cases, no further action is required. However, if recent HCV exposure is suspected, a test for HCV RNA (test code 64354) is suggested. . For additional information please refer to http://education.[facility].com/faq/FAQ22v1 (This link is being provided for informational/ educational purposes only.) .   [facility]ABRA [facility] 04/18/2023 8.9  mIU/mL Final   Comment:                     Reference Range .              Follicular Phase       2.5-10.2              Mid-cycle Peak         3.1-17.7              Luteal Phase           1.5- 9.1              Postmenopausal       23.0-116.3              .   . Estradiol  04/18/2023 36  pg/mL Final    Comment:       Reference Range         Follicular Phase:    19-144         Mid-Cycle:           64-357         Luteal Phase:        56-214         Postmenopausal:      < or = 31 . Reference range established on post-pubertal patient population. No pre-pubertal reference range established using this assay. For any patients for whom low Estradiol  levels are anticipated (e.g. males, pre-pubertal children and hypogonadal/post-menopausal  females), the  [facility] [employer] Estradiol , Ultrasensitive, LCMSMS assay is recommended (order code 69710). . Please note: patients being treated with the drug  fulvestrant (Faslodex(R)) have demonstrated significant  interference in immunoassay methods for estradiol   measurement. The cross reactivity could lead to falsely  elevated estradiol  test results leading to an  inappropriate clinical assessment of estrogen status. [facility] order code 30289-Estradiol ,  Ultrasensitive LC/MS/MS demonstrates negligible cross  re                          activity with fulvestrant.     Allergies: Ciprofloxacin and Wound dressing adhesive  Medications:  PTA Medications  Medication Sig  . zolpidem  (AMBIEN ) 5 MG tablet Take 1 tablet (5 mg total) by mouth at bedtime as needed for sleep.  SABRA lumateperone tosylate (CAPLYTA) 42 MG capsule   . pregabalin  (LYRICA ) 150 MG capsule TAKE 1 CAPSULE BY MOUTH TWICE A DAY  . clonazePAM  (KLONOPIN ) 0.5 MG tablet Take 1 tablet (0.5 mg total) by mouth 2 (two) times daily as needed. for anxiety  . AUVELITY 45-105 MG TBCR Take by mouth.  . HYDROcodone -acetaminophen  (NORCO) 10-325 MG tablet Take 1 tablet by mouth every 6 (six) hours as needed.  . azelastine  (ASTELIN ) 0.1 % nasal spray Place 2 sprays into both nostrils 2 (two) times daily.  . tiZANidine  (ZANAFLEX ) 4 MG tablet Take 1 tablet (4 mg total) by mouth 3 (three) times daily.  . QUEtiapine  (SEROQUEL ) 200 MG tablet Take 2 tablets by mouth at bedtime.   . losartan  (COZAAR ) 50 MG tablet Take 1 tablet (50 mg total) by mouth daily.  . Continuous Glucose Sensor (FREESTYLE LIBRE 3 PLUS SENSOR) MISC Change sensor every 15 days. (Patient not taking: Reported on 10/16/2023)  . tirzepatide  (MOUNJARO ) 10 MG/0.5ML Pen Inject 10 mg into the skin once a week.  . VENTOLIN  HFA 108 (90 Base) MCG/ACT inhaler INHALE 1-2 PUFFS BY MOUTH EVERY 6 HOURS AS NEEDED FOR WHEEZE OR SHORTNESS OF BREATH  . pantoprazole  (PROTONIX ) 40 MG tablet Take 1 tablet (40 mg total) by mouth daily.  . rosuvastatin  (CRESTOR ) 5 MG tablet TAKE 1 TABLET (5 MG TOTAL) BY MOUTH DAILY.  . SUMAtriptan  (IMITREX ) 100 MG tablet Take 1 tablet at earliest onset of migraine.  May repeat in 2 hours if headache persists or recurs.  Maximum 2 tablets in 24 hours.  . botulinum toxin Type A  (BOTOX ) 200 units injection Inject 155 units IM into multiple site in the face,neck and head once every 90 days  . methocarbamol  (ROBAXIN ) 750 MG tablet Take 1 tablet (750 mg total) by mouth 3 (three) times daily as needed.  . ondansetron  (ZOFRAN -ODT) 4 MG disintegrating tablet TAKE 1 TABLET (4 MG TOTAL) BY MOUTH EVERY 6 (SIX) HOURS AS NEEDED FOR NAUSEA OR VOMITING  . promethazine  (PHENERGAN ) 25 MG tablet TAKE 1 TABLET BY MOUTH EVERY 8 HOURS AS NEEDED FOR NAUSEA OR VOMITING.      Medical Decision Making  Inpatient unit, will hold in observation until a bed becomes available    Recommendations  Based on my evaluation the patient does not appear to have an emergency medical condition.  Gaither Pouch, NP 10/17/23  10:52 PM

## 2023-10-18 ENCOUNTER — Inpatient Hospital Stay (HOSPITAL_COMMUNITY)
Admission: AD | Admit: 2023-10-18 | Discharge: 2023-10-23 | DRG: 885 | Disposition: A | Discharge status: Home / Self Care | Source: Intra-hospital | Attending: Psychiatry | Admitting: Psychiatry

## 2023-10-18 ENCOUNTER — Encounter (HOSPITAL_COMMUNITY): Payer: Self-pay

## 2023-10-18 ENCOUNTER — Other Ambulatory Visit: Payer: Self-pay

## 2023-10-18 ENCOUNTER — Ambulatory Visit: Payer: Self-pay | Admitting: Obstetrics and Gynecology

## 2023-10-18 DIAGNOSIS — Z7389 Other problems related to life management difficulty: Secondary | ICD-10-CM | POA: Diagnosis not present

## 2023-10-18 DIAGNOSIS — K5900 Constipation, unspecified: Secondary | ICD-10-CM | POA: Diagnosis present

## 2023-10-18 DIAGNOSIS — E119 Type 2 diabetes mellitus without complications: Secondary | ICD-10-CM | POA: Diagnosis present

## 2023-10-18 DIAGNOSIS — J45909 Unspecified asthma, uncomplicated: Secondary | ICD-10-CM | POA: Diagnosis present

## 2023-10-18 DIAGNOSIS — Z5941 Food insecurity: Secondary | ICD-10-CM | POA: Diagnosis not present

## 2023-10-18 DIAGNOSIS — Z604 Social exclusion and rejection: Secondary | ICD-10-CM | POA: Diagnosis present

## 2023-10-18 DIAGNOSIS — E785 Hyperlipidemia, unspecified: Secondary | ICD-10-CM | POA: Diagnosis present

## 2023-10-18 DIAGNOSIS — R45851 Suicidal ideations: Secondary | ICD-10-CM | POA: Diagnosis present

## 2023-10-18 DIAGNOSIS — Z96642 Presence of left artificial hip joint: Secondary | ICD-10-CM | POA: Diagnosis present

## 2023-10-18 DIAGNOSIS — Z91048 Other nonmedicinal substance allergy status: Secondary | ICD-10-CM

## 2023-10-18 DIAGNOSIS — Z723 Lack of physical exercise: Secondary | ICD-10-CM

## 2023-10-18 DIAGNOSIS — G8929 Other chronic pain: Secondary | ICD-10-CM | POA: Diagnosis present

## 2023-10-18 DIAGNOSIS — Z5982 Transportation insecurity: Secondary | ICD-10-CM

## 2023-10-18 DIAGNOSIS — Z9884 Bariatric surgery status: Secondary | ICD-10-CM

## 2023-10-18 DIAGNOSIS — Z833 Family history of diabetes mellitus: Secondary | ICD-10-CM | POA: Diagnosis not present

## 2023-10-18 DIAGNOSIS — M533 Sacrococcygeal disorders, not elsewhere classified: Secondary | ICD-10-CM | POA: Diagnosis present

## 2023-10-18 DIAGNOSIS — I10 Essential (primary) hypertension: Secondary | ICD-10-CM | POA: Diagnosis present

## 2023-10-18 DIAGNOSIS — K219 Gastro-esophageal reflux disease without esophagitis: Secondary | ICD-10-CM | POA: Diagnosis present

## 2023-10-18 DIAGNOSIS — M797 Fibromyalgia: Secondary | ICD-10-CM | POA: Diagnosis present

## 2023-10-18 DIAGNOSIS — Z733 Stress, not elsewhere classified: Secondary | ICD-10-CM | POA: Diagnosis not present

## 2023-10-18 DIAGNOSIS — F3163 Bipolar disorder, current episode mixed, severe, without psychotic features: Principal | ICD-10-CM | POA: Diagnosis present

## 2023-10-18 DIAGNOSIS — F39 Unspecified mood [affective] disorder: Principal | ICD-10-CM

## 2023-10-18 DIAGNOSIS — F603 Borderline personality disorder: Secondary | ICD-10-CM | POA: Diagnosis present

## 2023-10-18 DIAGNOSIS — Z5986 Financial insecurity: Secondary | ICD-10-CM

## 2023-10-18 DIAGNOSIS — D509 Iron deficiency anemia, unspecified: Secondary | ICD-10-CM | POA: Diagnosis present

## 2023-10-18 DIAGNOSIS — F4310 Post-traumatic stress disorder, unspecified: Secondary | ICD-10-CM | POA: Diagnosis present

## 2023-10-18 DIAGNOSIS — R4589 Other symptoms and signs involving emotional state: Secondary | ICD-10-CM | POA: Diagnosis not present

## 2023-10-18 DIAGNOSIS — F411 Generalized anxiety disorder: Secondary | ICD-10-CM | POA: Diagnosis present

## 2023-10-18 DIAGNOSIS — Z79899 Other long term (current) drug therapy: Secondary | ICD-10-CM | POA: Diagnosis not present

## 2023-10-18 DIAGNOSIS — Z825 Family history of asthma and other chronic lower respiratory diseases: Secondary | ICD-10-CM

## 2023-10-18 DIAGNOSIS — Z8249 Family history of ischemic heart disease and other diseases of the circulatory system: Secondary | ICD-10-CM

## 2023-10-18 DIAGNOSIS — Z881 Allergy status to other antibiotic agents status: Secondary | ICD-10-CM

## 2023-10-18 DIAGNOSIS — Z634 Disappearance and death of family member: Secondary | ICD-10-CM

## 2023-10-18 LAB — COMPREHENSIVE METABOLIC PANEL WITH GFR
ALT: 33 U/L (ref 0–44)
AST: 26 U/L (ref 15–41)
Albumin: 4.4 g/dL (ref 3.5–5.0)
Alkaline Phosphatase: 58 U/L (ref 38–126)
Anion gap: 11 (ref 5–15)
BUN: 7 mg/dL (ref 6–20)
CO2: 25 mmol/L (ref 22–32)
Calcium: 9.4 mg/dL (ref 8.9–10.3)
Chloride: 98 mmol/L (ref 98–111)
Creatinine, Ser: 0.76 mg/dL (ref 0.44–1.00)
GFR, Estimated: >60 mL/min (ref >60)
Glucose, Bld: 108 mg/dL — ABNORMAL HIGH (ref 70–99)
Potassium: 3.6 mmol/L (ref 3.5–5.1)
Sodium: 134 mmol/L — ABNORMAL LOW (ref 135–145)
Total Bilirubin: 0.5 mg/dL (ref 0.0–1.2)
Total Protein: 7.1 g/dL (ref 6.5–8.1)

## 2023-10-18 LAB — POCT URINE DRUG SCREEN - MANUAL ENTRY (I-SCREEN)
POC Amphetamine UR: NOT DETECTED
POC Buprenorphine (BUP): NOT DETECTED
POC Cocaine UR: NOT DETECTED
POC Marijuana UR: POSITIVE — AB
POC Methadone UR: NOT DETECTED
POC Methamphetamine UR: NOT DETECTED
POC Morphine: POSITIVE — AB
POC Oxazepam (BZO): NOT DETECTED
POC Oxycodone UR: NOT DETECTED
POC Secobarbital (BAR): NOT DETECTED

## 2023-10-18 LAB — CYTOLOGY - PAP
Comment: NEGATIVE
Comment: NEGATIVE
Comment: NEGATIVE
HPV 16: NEGATIVE
HPV 18 / 45: POSITIVE — AB
High risk HPV: POSITIVE — AB

## 2023-10-18 LAB — TSH: TSH: 1.755 u[IU]/mL (ref 0.350–4.500)

## 2023-10-18 LAB — GLUCOSE, CAPILLARY: Glucose-Capillary: 129 mg/dL — ABNORMAL HIGH (ref 70–99)

## 2023-10-18 LAB — CBC WITH DIFFERENTIAL/PLATELET
Abs Immature Granulocytes: 0.04 K/uL (ref 0.00–0.07)
Basophils Absolute: 0.1 K/uL (ref 0.0–0.1)
Basophils Relative: 1 %
Eosinophils Absolute: 0.4 K/uL (ref 0.0–0.5)
Eosinophils Relative: 3 %
HCT: 42.7 % (ref 36.0–46.0)
Hemoglobin: 13.9 g/dL (ref 12.0–15.0)
Immature Granulocytes: 0 %
Lymphocytes Relative: 34 %
Lymphs Abs: 3.7 K/uL (ref 0.7–4.0)
MCH: 27.9 pg (ref 26.0–34.0)
MCHC: 32.6 g/dL (ref 30.0–36.0)
MCV: 85.6 fL (ref 80.0–100.0)
Monocytes Absolute: 0.8 K/uL (ref 0.1–1.0)
Monocytes Relative: 8 %
Neutro Abs: 6 K/uL (ref 1.7–7.7)
Neutrophils Relative %: 54 %
Platelets: 322 K/uL (ref 150–400)
RBC: 4.99 MIL/uL (ref 3.87–5.11)
RDW: 19.6 % — ABNORMAL HIGH (ref 11.5–15.5)
WBC: 11 K/uL — ABNORMAL HIGH (ref 4.0–10.5)
nRBC: 0 % (ref 0.0–0.2)

## 2023-10-18 LAB — ETHANOL: Alcohol, Ethyl (B): <15 mg/dL (ref <15)

## 2023-10-18 LAB — POC URINE PREG, ED: Preg Test, Ur: NEGATIVE

## 2023-10-18 MED ORDER — ACETAMINOPHEN 325 MG PO TABS
650.0000 mg | ORAL_TABLET | Freq: Four times a day (QID) | ORAL | Status: DC | PRN
  Administered 2023-10-19 – 2023-10-21 (×4): 650 mg via ORAL
  Filled 2023-10-18 (×5): qty 2

## 2023-10-18 MED ORDER — SUMATRIPTAN SUCCINATE 50 MG PO TABS: 50.0000 mg | ORAL_TABLET | ORAL | Status: DC | PRN

## 2023-10-18 MED ORDER — ROSUVASTATIN CALCIUM 10 MG PO TABS
5.0000 mg | ORAL_TABLET | Freq: Every day | ORAL | Status: DC
  Administered 2023-10-19 – 2023-10-23 (×5): 5 mg via ORAL
  Filled 2023-10-18 (×5): qty 1

## 2023-10-18 MED ORDER — QUETIAPINE FUMARATE 400 MG PO TABS: 400.0000 mg | ORAL_TABLET | Freq: Every day | ORAL | Status: DC

## 2023-10-18 MED ORDER — METHOCARBAMOL 750 MG PO TABS: 750.0000 mg | ORAL_TABLET | Freq: Three times a day (TID) | ORAL | Status: DC | PRN

## 2023-10-18 MED ORDER — LOSARTAN POTASSIUM 50 MG PO TABS
50.0000 mg | ORAL_TABLET | Freq: Every day | ORAL | Status: DC
Start: 2023-10-19 — End: 2023-10-23
  Administered 2023-10-19 – 2023-10-23 (×5): 50 mg via ORAL
  Filled 2023-10-18 (×5): qty 1

## 2023-10-18 MED ORDER — ENSURE ENLIVE PO LIQD
237.0000 mL | Freq: Two times a day (BID) | ORAL | Status: DC
  Administered 2023-10-18 (×2): 237 mL via ORAL
  Filled 2023-10-18 (×3): qty 237

## 2023-10-18 MED ORDER — ONDANSETRON 4 MG PO TBDP
4.0000 mg | ORAL_TABLET | Freq: Four times a day (QID) | ORAL | Status: DC | PRN
  Administered 2023-10-18: 4 mg via ORAL
  Filled 2023-10-18: qty 1

## 2023-10-18 MED ORDER — LOSARTAN POTASSIUM 50 MG PO TABS
50.0000 mg | ORAL_TABLET | Freq: Every day | ORAL | Status: DC
  Administered 2023-10-18: 50 mg via ORAL
  Filled 2023-10-18: qty 1

## 2023-10-18 MED ORDER — ROSUVASTATIN CALCIUM 5 MG PO TABS
5.0000 mg | ORAL_TABLET | Freq: Every day | ORAL | Status: DC
  Administered 2023-10-18: 5 mg via ORAL
  Filled 2023-10-18: qty 1

## 2023-10-18 MED ORDER — QUETIAPINE FUMARATE 400 MG PO TABS
400.0000 mg | ORAL_TABLET | Freq: Every day | ORAL | Status: DC
  Administered 2023-10-18 – 2023-10-22 (×5): 400 mg via ORAL
  Filled 2023-10-18 (×5): qty 2

## 2023-10-18 MED ORDER — DIPHENHYDRAMINE HCL 50 MG/ML IJ SOLN: 50.0000 mg | Freq: Three times a day (TID) | INTRAMUSCULAR | Status: DC | PRN

## 2023-10-18 MED ORDER — ENSURE ENLIVE PO LIQD
237.0000 mL | Freq: Two times a day (BID) | ORAL | Status: DC
  Filled 2023-10-18 (×11): qty 237

## 2023-10-18 MED ORDER — TRAZODONE HCL 100 MG PO TABS
100.0000 mg | ORAL_TABLET | Freq: Every evening | ORAL | Status: DC | PRN
Start: 2023-10-18 — End: 2023-10-20
  Administered 2023-10-18: 100 mg via ORAL
  Filled 2023-10-18 (×2): qty 1

## 2023-10-18 MED ORDER — LORAZEPAM 2 MG/ML IJ SOLN: 2.0000 mg | Freq: Three times a day (TID) | INTRAMUSCULAR | Status: DC | PRN

## 2023-10-18 MED ORDER — PREGABALIN 75 MG PO CAPS
150.0000 mg | ORAL_CAPSULE | Freq: Two times a day (BID) | ORAL | Status: DC
  Administered 2023-10-18: 150 mg via ORAL
  Filled 2023-10-18: qty 2

## 2023-10-18 MED ORDER — ONDANSETRON 4 MG PO TBDP
4.0000 mg | ORAL_TABLET | Freq: Four times a day (QID) | ORAL | Status: DC | PRN
  Administered 2023-10-18 – 2023-10-23 (×6): 4 mg via ORAL
  Filled 2023-10-18 (×6): qty 1

## 2023-10-18 MED ORDER — ALUM & MAG HYDROXIDE-SIMETH 200-200-20 MG/5ML PO SUSP: 30.0000 mL | ORAL | Status: DC | PRN

## 2023-10-18 MED ORDER — CLONAZEPAM 0.5 MG PO TABS
0.5000 mg | ORAL_TABLET | Freq: Two times a day (BID) | ORAL | Status: DC
  Administered 2023-10-18: 0.5 mg via ORAL
  Filled 2023-10-18: qty 1

## 2023-10-18 MED ORDER — HALOPERIDOL LACTATE 5 MG/ML IJ SOLN: 10.0000 mg | Freq: Three times a day (TID) | INTRAMUSCULAR | Status: DC | PRN

## 2023-10-18 MED ORDER — DIPHENHYDRAMINE HCL 50 MG/ML IJ SOLN
50.0000 mg | Freq: Three times a day (TID) | INTRAMUSCULAR | Status: DC | PRN
Start: 2023-10-18 — End: 2023-10-23

## 2023-10-18 MED ORDER — PANTOPRAZOLE SODIUM 40 MG PO TBEC
40.0000 mg | DELAYED_RELEASE_TABLET | Freq: Every day | ORAL | Status: DC
  Administered 2023-10-18: 40 mg via ORAL
  Filled 2023-10-18: qty 1

## 2023-10-18 MED ORDER — ALBUTEROL SULFATE HFA 108 (90 BASE) MCG/ACT IN AERS: 2.0000 | INHALATION_SPRAY | Freq: Four times a day (QID) | RESPIRATORY_TRACT | Status: DC | PRN

## 2023-10-18 MED ORDER — HYDROXYZINE HCL 25 MG PO TABS
25.0000 mg | ORAL_TABLET | Freq: Three times a day (TID) | ORAL | Status: DC | PRN
  Administered 2023-10-18: 25 mg via ORAL
  Filled 2023-10-18: qty 1

## 2023-10-18 MED ORDER — HALOPERIDOL 5 MG PO TABS
5.0000 mg | ORAL_TABLET | Freq: Three times a day (TID) | ORAL | Status: DC | PRN
Start: 2023-10-18 — End: 2023-10-23

## 2023-10-18 MED ORDER — HALOPERIDOL LACTATE 5 MG/ML IJ SOLN: 5.0000 mg | Freq: Three times a day (TID) | INTRAMUSCULAR | Status: DC | PRN

## 2023-10-18 MED ORDER — PANTOPRAZOLE SODIUM 40 MG PO TBEC
40.0000 mg | DELAYED_RELEASE_TABLET | Freq: Every day | ORAL | Status: DC
Start: 2023-10-19 — End: 2023-10-23
  Administered 2023-10-19 – 2023-10-23 (×5): 40 mg via ORAL
  Filled 2023-10-18 (×5): qty 1

## 2023-10-18 MED ORDER — PREGABALIN 75 MG PO CAPS
150.0000 mg | ORAL_CAPSULE | Freq: Two times a day (BID) | ORAL | Status: DC
  Administered 2023-10-18 – 2023-10-19 (×2): 150 mg via ORAL
  Filled 2023-10-18 (×2): qty 2

## 2023-10-18 MED ORDER — MAGNESIUM HYDROXIDE 400 MG/5ML PO SUSP
30.0000 mL | Freq: Every day | ORAL | Status: DC | PRN
  Administered 2023-10-20: 30 mL via ORAL
  Filled 2023-10-18: qty 30

## 2023-10-18 MED ORDER — IBUPROFEN 600 MG PO TABS
600.0000 mg | ORAL_TABLET | Freq: Three times a day (TID) | ORAL | Status: DC | PRN
  Administered 2023-10-18: 600 mg via ORAL
  Filled 2023-10-18: qty 1

## 2023-10-18 MED ORDER — CLONAZEPAM 0.5 MG PO TABS
0.5000 mg | ORAL_TABLET | Freq: Two times a day (BID) | ORAL | Status: DC
  Administered 2023-10-18 – 2023-10-23 (×10): 0.5 mg via ORAL
  Filled 2023-10-18 (×11): qty 1

## 2023-10-18 MED ORDER — METHOCARBAMOL 750 MG PO TABS
750.0000 mg | ORAL_TABLET | Freq: Three times a day (TID) | ORAL | Status: DC | PRN
  Administered 2023-10-19 – 2023-10-23 (×10): 750 mg via ORAL
  Filled 2023-10-18 (×10): qty 1

## 2023-10-18 MED ORDER — DIPHENHYDRAMINE HCL 25 MG PO CAPS: 50.0000 mg | ORAL_CAPSULE | Freq: Three times a day (TID) | ORAL | Status: DC | PRN

## 2023-10-18 MED ORDER — IBUPROFEN 600 MG PO TABS
600.0000 mg | ORAL_TABLET | Freq: Three times a day (TID) | ORAL | Status: DC | PRN
  Administered 2023-10-18 – 2023-10-19 (×2): 600 mg via ORAL
  Filled 2023-10-18 (×2): qty 1

## 2023-10-18 NOTE — Progress Notes (Signed)
 Pt has been accepted to [facility] on 10/18/2023 Bed assignment: 306-02  Pt meets inpatient criteria per: Gaither Pouch NP  Attending Physician will be: Dr. Prentis    Report can be called to: e unit: Adult unit: [phone]  Pt can arrive after 3 PM   Care Team Notified: [facility] RN, Damien Fireman RN, Lynwood Bash RN  [place] Jaydyn Menon LCSW-A   10/18/2023 1:22 PM

## 2023-10-18 NOTE — Progress Notes (Signed)
   10/18/23 2120  Psych Admission Type (Psych Patients Only)  Admission Status Voluntary  Psychosocial Assessment  Patient Complaints Worrying  Eye Contact Fair  Facial Expression Worried  Affect Appropriate to circumstance  Speech Logical/coherent  Interaction Assertive  Motor Activity Other (Comment) (WNL)  Appearance/Hygiene Unremarkable  Behavior Characteristics Appropriate to situation  Mood Pleasant  Thought Process  Coherency WDL  Content WDL  Delusions None reported or observed  Perception WDL  Hallucination None reported or observed  Judgment Impaired  Confusion None  Danger to Self  Current suicidal ideation? Passive (Currently Denies)  Agreement Not to Harm Self Yes  Description of Agreement Notify Staff

## 2023-10-18 NOTE — ED Notes (Signed)
 Pt sleeping at this time. Rise and fall of chest noted. Pt in NAD at this time. Will continue to monitor.

## 2023-10-18 NOTE — ED Notes (Signed)
 Pt A&Ox4, calm & cooperative and in NAD at this time. Denies HI/AVH. Endorses SI w/ plan to OD on pills. Encouragement and support given. Will continue to monitor.

## 2023-10-18 NOTE — Telephone Encounter (Signed)
 LMOVM for patient MRI approved.   Is the Referral for Vestibular rehab?
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
## 2023-10-18 NOTE — Discharge Instructions (Addendum)
 Defer to [facility]

## 2023-10-18 NOTE — BH Assessment (Signed)
(  Sleep Hours) - 7.75 (Any PRNs that were needed, meds refused, or side effects to meds)- IBU 600 mg, Trazodone  100 mg PO (Any disturbances and when (visitation, over night)- None (Concerns raised by the patient)- Pt is concerned that she has not had her Bipolar meds, Pain meds, nor Psych meds. Pt addressed noted nerve damage to thigh, herniated disc and fibromyalgia taking Hydrocodone  10 mg PRN.  (SI/HI/AVH)- Denies

## 2023-10-18 NOTE — Tx Team (Signed)
 Initial Treatment Plan 10/18/2023 6:00 PM Heidi Holland [id number]    PATIENT STRESSORS: Financial difficulties   Health problems   Occupational concerns     PATIENT STRENGTHS: Ability for insight  Capable of independent living  Communication skills  Motivation for treatment/growth  Supportive family/friends    PATIENT IDENTIFIED PROBLEMS: To figure out who I am  To get healthier and form some support  Suicidal thoughts  Depression  Financial difficulties  Occupational concern  Ineffective coping skills         DISCHARGE CRITERIA:  Ability to meet basic life and health needs Motivation to continue treatment in a less acute level of care  PRELIMINARY DISCHARGE PLAN: Attend aftercare/continuing care group Outpatient therapy Return to previous living arrangement  PATIENT/FAMILY INVOLVEMENT: This treatment plan has been presented to and reviewed with the patient, Heidi Holland, and/or family member.  The patient and family have been given the opportunity to ask questions and make suggestions.  [name], RN 10/18/2023, 6:00 PM

## 2023-10-18 NOTE — ED Notes (Signed)
 Patient currently sleeping and resting in recliner. RR even and unlabored, appearing in no noted distress. Environmental check complete

## 2023-10-18 NOTE — Group Note (Signed)
 Date:  10/18/2023 Time:  9:21 PM  Group Topic/Focus:  Wrap-Up Group:   The focus of this group is to help patients review their daily goal of treatment and discuss progress on daily workbooks.    Additional Comments:  Pt attended the AA meeting without disruption.  Heidi Holland 10/18/2023, 9:21 PM

## 2023-10-18 NOTE — ED Provider Notes (Signed)
 FBC/OBS ASAP Discharge Summary  Date and Time: 10/18/2023 1:01 PM  Name: Heidi Holland  MRN:  [id number]   Discharge Diagnoses:  Final diagnoses:  Suicidal ideation  Difficulty coping    Subjective: : Heidi Holland, 48 y/o female with a history of bipolar dx, borderline personality disorder, PTSD, GAD presented to [facility] on 10/17/2023 for acute suicidal ideations with plan to overdose on pills.  Patient was seen on follow-up this morning outside in the Courtyard at the behavioral health urgent care.  She reported that she has been struggling with the anniversaries of some significant losses that occurred around this time.  She is also struggling with the likelihood of being evicted from her apartment.    Patient has a sense of hopelessness, guilt, worthlessness, variable changes to her eating habits as a result of her combination of gastric bypass + mounjaro , current insomnia, worsening chronic pain (on opioids, muscle relaxants, pregabalin ). She has current suicidal ideation and a recent attempt at self-injury via a dull knife in late August 2025.   Patient takes several high risk medications including clonazepam , muscle relaxants, pregabalin , opioids.  Patient takes multiple sleeping medications and has a history of Ambien  use.  She has no insight into the dangers associated with the combination of higher dose opioids (such as the 10mg  oxycodone  she gets 3 times daily) and benzodiazepines.  Stay Summary: Patient was admitted on 9/4 and did not sleep at all through the morning of 9/5. Patient was alert and cooperative with exam.  Patient had extreme agitation and emotional lability with constant need for reassurance.  Patient is unable to tolerate physical or emotional discomfort.  Total Time spent with patient: 30 minutes  Past Psychiatric History: Bipolar 1, posttraumatic stress disorder, borderline personality disorder, GAD, fibromyalgia, chronic pain with chronic opioid use, migraines, chronic  benzodiazepine use. History of NSSIB (cutting). Past Medical History: asthma, hypertension, hyperlipidemia, t2dm, abnormal pap smear (LSIL), GERD, Status post left hip replacement with subsequent nerve damage.  Status post gastric bypass with poor absorption.  Iron deficiency anemia for which the patient receives infusions. Family History: Both parents deceased. Both parents had CAD, HTN. Mother had Lung cancer, COPD, fibromyalgia, rheumatoid arthritis. Father had T2dm, hodgkin's lymphoma. Family Psychiatric History: Brother died of overdose Social History: Patient has been living with her sister. Has been unable to work recently due to her chronic pain and emotional instability.  Tobacco Cessation:  A prescription for an FDA-approved tobacco cessation medication provided at discharge  Current Medications:  Current Facility-Administered Medications  Medication Dose Route Frequency Provider Last Rate Last Admin   acetaminophen  (TYLENOL ) tablet 650 mg  650 mg Oral Q6H PRN Trudy Carwin, NP   650 mg at 10/18/23 0754   albuterol  (VENTOLIN  HFA) 108 (90 Base) MCG/ACT inhaler 2 puff  2 puff Inhalation Q6H PRN Delsie Lynwood Morene Lavone, MD       alum & mag hydroxide-simeth (MAALOX/MYLANTA) 200-200-20 MG/5ML suspension 30 mL  30 mL Oral Q4H PRN Trudy Carwin, NP       clonazePAM  (KLONOPIN ) tablet 0.5 mg  0.5 mg Oral BID Delsie Lynwood Morene Lavone, MD       haloperidol  (HALDOL ) tablet 5 mg  5 mg Oral TID PRN Trudy Carwin, NP       And   diphenhydrAMINE  (BENADRYL ) capsule 50 mg  50 mg Oral TID PRN Trudy Carwin, NP       feeding supplement (ENSURE ENLIVE / ENSURE PLUS) liquid 237 mL  237 mL Oral BID BM Liah Morr,  Lynwood Morene Deems, MD   237 mL at 10/18/23 1012   hydrOXYzine  (ATARAX ) tablet 25 mg  25 mg Oral TID PRN Delsie Lynwood Morene Deems, MD   25 mg at 10/18/23 9171   losartan  (COZAAR ) tablet 50 mg  50 mg Oral Daily Delsie Lynwood Morene Deems, MD   50 mg at 10/18/23 9171    magnesium  hydroxide (MILK OF MAGNESIA) suspension 30 mL  30 mL Oral Daily PRN Trudy Carwin, NP       methocarbamol  (ROBAXIN ) tablet 750 mg  750 mg Oral Q8H PRN Delsie Lynwood Morene Deems, MD       OLANZapine  (ZYPREXA ) injection 10 mg  10 mg Intramuscular TID PRN Trudy Carwin, NP       OLANZapine  (ZYPREXA ) injection 5 mg  5 mg Intramuscular TID PRN Trudy Carwin, NP       ondansetron  (ZOFRAN -ODT) disintegrating tablet 4 mg  4 mg Oral Q6H PRN Delsie Lynwood Morene Deems, MD   4 mg at 10/18/23 0827   pantoprazole  (PROTONIX ) EC tablet 40 mg  40 mg Oral Daily Delsie Lynwood Morene Deems, MD   40 mg at 10/18/23 9171   pregabalin  (LYRICA ) capsule 150 mg  150 mg Oral BID Delsie Lynwood Morene Deems, MD   150 mg at 10/18/23 9171   QUEtiapine  (SEROQUEL ) tablet 400 mg  400 mg Oral QHS Delsie Lynwood Morene Deems, MD       rosuvastatin  (CRESTOR ) tablet 5 mg  5 mg Oral Daily Delsie Lynwood Morene Deems, MD   5 mg at 10/18/23 9171   SUMAtriptan  (IMITREX ) tablet 50 mg  50 mg Oral Q2H PRN Delsie Lynwood Morene Deems, MD       traZODone  (DESYREL ) tablet 100 mg  100 mg Oral QHS PRN Trudy Carwin, NP       Current Outpatient Medications  Medication Sig Dispense Refill   botulinum toxin Type A  (BOTOX ) 200 units injection Inject 155 units IM into multiple site in the face,neck and head once every 90 days 1 each 4   buPROPion (WELLBUTRIN XL) 150 MG 24 hr tablet Take 150 mg by mouth daily.     clonazePAM  (KLONOPIN ) 0.5 MG tablet Take 1 tablet (0.5 mg total) by mouth 2 (two) times daily as needed. for anxiety (Patient taking differently: Take 0.5 mg by mouth 2 (two) times daily.) 60 tablet 0   Dextromethorphan-buPROPion ER (AUVELITY) 45-105 MG TBCR Take 1 tablet by mouth 2 (two) times daily.     HYDROcodone -acetaminophen  (NORCO) 10-325 MG tablet Take 1 tablet by mouth every 6 (six) hours as needed (For pain).     losartan  (COZAAR ) 50 MG tablet Take 1 tablet (50 mg total) by mouth daily.  (Patient taking differently: Take 50 mg by mouth at bedtime.) 90 tablet 1   lumateperone tosylate (CAPLYTA) 42 MG capsule Take 42 mg by mouth at bedtime.     methocarbamol  (ROBAXIN ) 750 MG tablet Take 1 tablet (750 mg total) by mouth 3 (three) times daily as needed. (Patient taking differently: Take 750 mg by mouth 3 (three) times daily as needed for muscle spasms.) 90 tablet 5   ondansetron  (ZOFRAN -ODT) 4 MG disintegrating tablet TAKE 1 TABLET (4 MG TOTAL) BY MOUTH EVERY 6 (SIX) HOURS AS NEEDED FOR NAUSEA OR VOMITING 18 tablet 11   pantoprazole  (PROTONIX ) 40 MG tablet Take 1 tablet (40 mg total) by mouth daily. 90 tablet 1   pregabalin  (LYRICA ) 150 MG capsule TAKE 1 CAPSULE BY MOUTH TWICE A DAY (Patient taking differently: Take 150  mg by mouth 2 (two) times daily.) 60 capsule 5   promethazine  (PHENERGAN ) 25 MG tablet TAKE 1 TABLET BY MOUTH EVERY 8 HOURS AS NEEDED FOR NAUSEA OR VOMITING. (Patient taking differently: Take 25 mg by mouth every 8 (eight) hours as needed for nausea or vomiting.) 20 tablet 0   QUEtiapine  (SEROQUEL ) 200 MG tablet Take 400 mg by mouth at bedtime.     rosuvastatin  (CRESTOR ) 5 MG tablet TAKE 1 TABLET (5 MG TOTAL) BY MOUTH DAILY. (Patient taking differently: Take 5 mg by mouth at bedtime.) 90 tablet 3   SUMAtriptan  (IMITREX ) 100 MG tablet Take 1 tablet at earliest onset of migraine.  May repeat in 2 hours if headache persists or recurs.  Maximum 2 tablets in 24 hours. (Patient taking differently: Take 100 mg by mouth every 2 (two) hours as needed for migraine or headache (May repeat in 2 hours if headache persists or recurs.  Maximum 2 tablets in 24 hours.).) 10 tablet 5   tirzepatide  (MOUNJARO ) 10 MG/0.5ML Pen Inject 10 mg into the skin once a week. (Patient taking differently: Inject 10 mg into the skin every Monday.) 6 mL 3   tiZANidine  (ZANAFLEX ) 4 MG tablet Take 1 tablet (4 mg total) by mouth 3 (three) times daily. (Patient taking differently: Take 4 mg by mouth 3 (three)  times daily as needed for muscle spasms.) 90 tablet 5   VENTOLIN  HFA 108 (90 Base) MCG/ACT inhaler INHALE 1-2 PUFFS BY MOUTH EVERY 6 HOURS AS NEEDED FOR WHEEZE OR SHORTNESS OF BREATH (Patient taking differently: Inhale 1-2 puffs into the lungs every 6 (six) hours as needed for wheezing or shortness of breath.) 18 each 2    PTA Medications:  Facility Ordered Medications  Medication   acetaminophen  (TYLENOL ) tablet 650 mg   alum & mag hydroxide-simeth (MAALOX/MYLANTA) 200-200-20 MG/5ML suspension 30 mL   magnesium  hydroxide (MILK OF MAGNESIA) suspension 30 mL   haloperidol  (HALDOL ) tablet 5 mg   And   diphenhydrAMINE  (BENADRYL ) capsule 50 mg   OLANZapine  (ZYPREXA ) injection 5 mg   OLANZapine  (ZYPREXA ) injection 10 mg   [COMPLETED] hydrOXYzine  (ATARAX ) tablet 50 mg   traZODone  (DESYREL ) tablet 100 mg   [COMPLETED] HYDROcodone -acetaminophen  (NORCO/VICODIN) 5-325 MG per tablet 1 tablet   hydrOXYzine  (ATARAX ) tablet 25 mg   losartan  (COZAAR ) tablet 50 mg   albuterol  (VENTOLIN  HFA) 108 (90 Base) MCG/ACT inhaler 2 puff   SUMAtriptan  (IMITREX ) tablet 50 mg   rosuvastatin  (CRESTOR ) tablet 5 mg   pantoprazole  (PROTONIX ) EC tablet 40 mg   ondansetron  (ZOFRAN -ODT) disintegrating tablet 4 mg   methocarbamol  (ROBAXIN ) tablet 750 mg   pregabalin  (LYRICA ) capsule 150 mg   feeding supplement (ENSURE ENLIVE / ENSURE PLUS) liquid 237 mL   QUEtiapine  (SEROQUEL ) tablet 400 mg   clonazePAM  (KLONOPIN ) tablet 0.5 mg   PTA Medications  Medication Sig   lumateperone tosylate (CAPLYTA) 42 MG capsule Take 42 mg by mouth at bedtime.   pregabalin  (LYRICA ) 150 MG capsule TAKE 1 CAPSULE BY MOUTH TWICE A DAY (Patient taking differently: Take 150 mg by mouth 2 (two) times daily.)   clonazePAM  (KLONOPIN ) 0.5 MG tablet Take 1 tablet (0.5 mg total) by mouth 2 (two) times daily as needed. for anxiety (Patient taking differently: Take 0.5 mg by mouth 2 (two) times daily.)   HYDROcodone -acetaminophen  (NORCO) 10-325 MG  tablet Take 1 tablet by mouth every 6 (six) hours as needed (For pain).   tiZANidine  (ZANAFLEX ) 4 MG tablet Take 1 tablet (4 mg total) by mouth  3 (three) times daily. (Patient taking differently: Take 4 mg by mouth 3 (three) times daily as needed for muscle spasms.)   losartan  (COZAAR ) 50 MG tablet Take 1 tablet (50 mg total) by mouth daily. (Patient taking differently: Take 50 mg by mouth at bedtime.)   tirzepatide  (MOUNJARO ) 10 MG/0.5ML Pen Inject 10 mg into the skin once a week. (Patient taking differently: Inject 10 mg into the skin every Monday.)   VENTOLIN  HFA 108 (90 Base) MCG/ACT inhaler INHALE 1-2 PUFFS BY MOUTH EVERY 6 HOURS AS NEEDED FOR WHEEZE OR SHORTNESS OF BREATH (Patient taking differently: Inhale 1-2 puffs into the lungs every 6 (six) hours as needed for wheezing or shortness of breath.)   pantoprazole  (PROTONIX ) 40 MG tablet Take 1 tablet (40 mg total) by mouth daily.   rosuvastatin  (CRESTOR ) 5 MG tablet TAKE 1 TABLET (5 MG TOTAL) BY MOUTH DAILY. (Patient taking differently: Take 5 mg by mouth at bedtime.)   SUMAtriptan  (IMITREX ) 100 MG tablet Take 1 tablet at earliest onset of migraine.  May repeat in 2 hours if headache persists or recurs.  Maximum 2 tablets in 24 hours. (Patient taking differently: Take 100 mg by mouth every 2 (two) hours as needed for migraine or headache (May repeat in 2 hours if headache persists or recurs.  Maximum 2 tablets in 24 hours.).)   botulinum toxin Type A  (BOTOX ) 200 units injection Inject 155 units IM into multiple site in the face,neck and head once every 90 days   methocarbamol  (ROBAXIN ) 750 MG tablet Take 1 tablet (750 mg total) by mouth 3 (three) times daily as needed. (Patient taking differently: Take 750 mg by mouth 3 (three) times daily as needed for muscle spasms.)   ondansetron  (ZOFRAN -ODT) 4 MG disintegrating tablet TAKE 1 TABLET (4 MG TOTAL) BY MOUTH EVERY 6 (SIX) HOURS AS NEEDED FOR NAUSEA OR VOMITING   promethazine  (PHENERGAN ) 25 MG tablet  TAKE 1 TABLET BY MOUTH EVERY 8 HOURS AS NEEDED FOR NAUSEA OR VOMITING. (Patient taking differently: Take 25 mg by mouth every 8 (eight) hours as needed for nausea or vomiting.)   buPROPion (WELLBUTRIN XL) 150 MG 24 hr tablet Take 150 mg by mouth daily.   QUEtiapine  (SEROQUEL ) 200 MG tablet Take 400 mg by mouth at bedtime.   Dextromethorphan-buPROPion ER (AUVELITY) 45-105 MG TBCR Take 1 tablet by mouth 2 (two) times daily.       10/16/2023    4:31 PM 06/27/2023    9:20 AM 02/20/2023    8:51 AM  Depression screen PHQ 2/9  Decreased Interest 1 0 1  Down, Depressed, Hopeless 2 0 1  PHQ - 2 Score 3 0 2  Altered sleeping 2 0 3  Tired, decreased energy 2 0 3  Change in appetite 3 0 2  Feeling bad or failure about yourself  0 0 2  Trouble concentrating 0 0 1  Moving slowly or fidgety/restless 0 0 0  Suicidal thoughts 0 0 0  PHQ-9 Score 10 0 13  Difficult doing work/chores  Not difficult at all Very difficult    Flowsheet Row ED from 10/17/2023 in [facility] UC from 04/09/2023 in [facility] ED from 01/16/2023 in [facility] Emergency Department at [facility]  C-SSRS RISK CATEGORY High Risk No Risk No Risk    Musculoskeletal  Strength & Muscle Tone: within normal limits Gait & Station: normal Patient leans: N/A  Psychiatric Specialty Exam  Presentation  General Appearance:  Casual  Eye Contact: Good  Speech: Clear and Coherent  Speech Volume: Normal  Handedness: Right   Mood and Affect  Mood: Anxious; Depressed  Affect: Congruent   Thought Process  Thought Processes: Coherent  Descriptions of Associations:Intact  Orientation:Full (Time, Place and Person)  Thought Content:WDL  Diagnosis of Schizophrenia or Schizoaffective disorder in past: No    Hallucinations:Hallucinations: None  Ideas of Reference:None  Suicidal Thoughts:Suicidal Thoughts: Yes, Active SI Active Intent and/or Plan: With  Intent; With Plan  Homicidal Thoughts:Homicidal Thoughts: No   Sensorium  Memory: Immediate Good  Judgment: Fair  Insight: Fair   Chartered certified accountant: Fair  Attention Span: Fair  Recall: [employer] of Knowledge: Fair  Language: Fair   Psychomotor Activity  Psychomotor Activity: Psychomotor Activity: Normal   Assets  Assets: Desire for Improvement; Resilience   Sleep  Sleep: Sleep: Fair  No Safety Checks orders active in given range  Nutritional Assessment (For OBS and FBC admissions only) Has the patient had a weight loss or gain of 10 pounds or more in the last 3 months?: No Has the patient had a decrease in food intake/or appetite?: No Does the patient have dental problems?: No Does the patient have eating habits or behaviors that may be indicators of an eating disorder including binging or inducing vomiting?: No Has the patient recently lost weight without trying?: 0 Has the patient been eating poorly because of a decreased appetite?: 0 Malnutrition Screening Tool Score: 0    Physical Exam  Physical Exam Vitals and nursing note reviewed.  Constitutional:      Appearance: She is obese.  HENT:     Head: Normocephalic.     Nose: Nose normal.  Pulmonary:     Effort: Pulmonary effort is normal.  Skin:    General: Skin is warm and dry.  Neurological:     Mental Status: She is alert and oriented to person, place, and time.  Psychiatric:        Attention and Perception: Attention and perception normal.        Mood and Affect: Mood is anxious and depressed. Affect is labile.        Speech: Speech is rapid and pressured.        Behavior: Behavior is agitated. Behavior is cooperative.        Thought Content: Thought content is not paranoid. Thought content includes suicidal ideation. Thought content does not include homicidal ideation.        Cognition and Memory: Cognition and memory normal.        Judgment: Judgment is  impulsive.    Review of Systems  Gastrointestinal:  Positive for heartburn and nausea.  Musculoskeletal:  Positive for back pain and joint pain.  Psychiatric/Behavioral:  Positive for depression, substance abuse and suicidal ideas. Negative for hallucinations and memory loss. The patient is nervous/anxious and has insomnia.    Blood pressure 111/70, pulse 83, temperature 98.4 F (36.9 C), temperature source Oral, resp. rate 18, last menstrual period 09/22/2023, SpO2 98%. There is no height or weight on file to calculate BMI.  Demographic Factors:  Divorced or widowed, Caucasian, Low socioeconomic status, and Unemployed  Loss Factors: Loss of significant relationship, Decline in physical health, Financial problems/change in socioeconomic status, and loss of purpose  Historical Factors: Prior suicide attempts, Family history of mental illness or substance abuse, Anniversary of important loss, and Impulsivity  Risk Reduction Factors:   Living with another person, especially a relative  Continued Clinical  Symptoms:  Severe Anxiety and/or Agitation Bipolar Disorder:   Mixed State Depressive phase Personality Disorders:   Cluster B Comorbid depression Chronic Pain More than one psychiatric diagnosis Unstable or Poor Therapeutic Relationship  Cognitive Features That Contribute To Risk:  Polarized thinking    Suicide Risk:  Severe:  Frequent, intense, and enduring suicidal ideation, specific plan, no subjective intent, but some objective markers of intent (i.e., choice of lethal method), the method is accessible, some limited preparatory behavior, evidence of impaired self-control, severe dysphoria/symptomatology, multiple risk factors present, and few if any protective factors, particularly a lack of social support.  Plan Of Care/Follow-up recommendations:  Defer to [facility].   Disposition: To [facility] for acute crisis stabilization.   Lynwood Morene Lavone Delsie, MD 10/18/2023, 1:01  PM

## 2023-10-18 NOTE — Progress Notes (Signed)
 Admission Note: Patient is a 48 years old female admitted to the unit voluntarily from [facility] for worsening depression and suicidal ideation with plan to overdose on pills.  Reports multiple stressors as triggers.  Patient is currently unemployed and is about to be evicted from her apartment.  Stated she is get to find out her worth, get healthier and to form some support system.  Admission plan of care reviewed, consent signed.  Skin and personal belongings completed.  Skin is dry and intact.  No contraband found.  Patient oriented to the unit, staff and room.  Routine safety checks initiated.  Verbalizes understanding of unit rules/protocols.  Patient is safe on the unit at this time.

## 2023-10-18 NOTE — ED Notes (Signed)
 Patient Vol transferred to [facility] via safe transport. Pt A&Ox4, ambulatory w/ steady gait and VSS upon departure. All belongings and paperwork given to safe transport.

## 2023-10-19 DIAGNOSIS — F39 Unspecified mood [affective] disorder: Principal | ICD-10-CM

## 2023-10-19 DIAGNOSIS — F411 Generalized anxiety disorder: Secondary | ICD-10-CM | POA: Insufficient documentation

## 2023-10-19 LAB — VITAMIN D 25 HYDROXY (VIT D DEFICIENCY, FRACTURES): Vit D, 25-Hydroxy: 31.13 ng/mL (ref 30–100)

## 2023-10-19 MED ORDER — ADULT MULTIVITAMIN W/MINERALS CH
1.0000 | ORAL_TABLET | Freq: Every day | ORAL | Status: DC
  Administered 2023-10-19 – 2023-10-23 (×5): 1 via ORAL
  Filled 2023-10-19 (×4): qty 1

## 2023-10-19 MED ORDER — SENNA 8.6 MG PO TABS
1.0000 | ORAL_TABLET | Freq: Every day | ORAL | Status: DC
  Administered 2023-10-19 – 2023-10-22 (×4): 8.6 mg via ORAL
  Filled 2023-10-19 (×3): qty 1

## 2023-10-19 MED ORDER — PREGABALIN 100 MG PO CAPS
200.0000 mg | ORAL_CAPSULE | Freq: Two times a day (BID) | ORAL | Status: DC
  Administered 2023-10-19 – 2023-10-23 (×8): 200 mg via ORAL
  Filled 2023-10-19 (×8): qty 2

## 2023-10-19 MED ORDER — BUPROPION HCL ER (XL) 150 MG PO TB24
150.0000 mg | ORAL_TABLET | Freq: Every day | ORAL | Status: DC
  Administered 2023-10-20 – 2023-10-23 (×4): 150 mg via ORAL
  Filled 2023-10-19 (×4): qty 1

## 2023-10-19 MED ORDER — LUMATEPERONE TOSYLATE 42 MG PO CAPS
42.0000 mg | ORAL_CAPSULE | Freq: Every day | ORAL | Status: DC
  Administered 2023-10-19 – 2023-10-23 (×5): 42 mg via ORAL
  Filled 2023-10-19 (×5): qty 1

## 2023-10-19 MED ORDER — HYDROCODONE-ACETAMINOPHEN 5-325 MG PO TABS
1.0000 | ORAL_TABLET | Freq: Four times a day (QID) | ORAL | Status: DC | PRN
  Administered 2023-10-19 – 2023-10-23 (×13): 1 via ORAL
  Filled 2023-10-19 (×13): qty 1

## 2023-10-19 MED ORDER — VITAMIN B-12 100 MCG PO TABS
100.0000 ug | ORAL_TABLET | Freq: Every day | ORAL | Status: DC
  Administered 2023-10-19 – 2023-10-23 (×5): 100 ug via ORAL
  Filled 2023-10-19 (×5): qty 1
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
## 2023-10-19 NOTE — [facility] Suicide Risk Assessment (Signed)
 [facility] INPATIENT:  Family/Significant Other Suicide Prevention Education  Suicide Prevention Education:  Education Completed; Verne Cove, sister, [phone], has been identified by the patient as the family member/significant other with whom the patient will be residing, and identified as the person(s) who will aid the patient in the event of a mental health crisis (suicidal ideations/suicide attempt).  With written consent from the patient, the family member/significant other has been provided the following suicide prevention education, prior to the and/or following the discharge of the patient.  The suicide prevention education provided includes the following: Suicide risk factors Suicide prevention and interventions National Suicide Hotline telephone number [facility] assessment telephone number [facility] Emergency Assistance 911 [facility] and/or Residential Mobile Crisis Unit telephone number  Request made of family/significant other to: Remove weapons (e.g., guns, rifles, knives), all items previously/currently identified as safety concern.   Remove drugs/medications (over-the-counter, prescriptions, illicit drugs), all items previously/currently identified as a safety concern.  The family member/significant other verbalizes understanding of the suicide prevention education information provided.  The family member/significant other agrees to remove the items of safety concern listed above.   Roselyn GORMAN Lento 10/19/2023, 2:51 PM

## 2023-10-19 NOTE — Group Note (Signed)
 Date:  10/19/2023 Time:  4:22 PM  Group Topic/Focus:  Managing Feelings:   The focus of this group is to identify what feelings patients have difficulty handling and develop a plan to handle them in a healthier way upon discharge.    Participation Level:  Active  Participation Quality:  Attentive  Affect:  Appropriate  Cognitive:  Alert and Appropriate  Insight: Appropriate  Engagement in Group:  Engaged  Modes of Intervention:  Activity, Discussion, and Education  Floria Brandau 10/19/2023, 4:22 PM

## 2023-10-19 NOTE — H&P (Signed)
 Psychiatric Admission Assessment Adult  Patient Identification: Heidi Holland MRN:  [id number] Date of Evaluation:  10/19/2023 Chief Complaint:  Suicidal Ideation Principal Diagnosis: Mood disorder (HCC) Diagnosis:  Principal Problem:   Mood disorder (HCC) Active Problems:   Generalized anxiety disorder  History of Present Illness: The patient is a 48 y/o female with a prior diagnosis of BPAD who was admitted for suicidal ideation with thoughts of overdosing in the context of multiple psychosocial stressors. She explains that she has been suffering a long string of multiple stressors for years that have been progressively taxing and recently have overwhelmed her. She states that while she has long experienced symptoms of depression and anxiety starting around 2009 she experienced multiple losses and subsequent hardships. She reports losing eight people close to her between 2009 and 2014, ending with her mother, who died in her home from lung cancer while on hospice. 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 She has been unable to pay her bills and is concerned that she will be facing eviction.   She endorses struggling with depression, anhedonia, hopelessness, fatigue, poor sleep, concentration difficulties, pain, generalized and pervasive worries that are difficult to control, panic attacks, irritability, and multiple somatic complaints. She reports that she was diagnosed with BPAD three years ago for symptoms of depression and a period of multiple days in a row wherein she was not sleeping and was highly anxious. She denies experiencing psychotic symptoms and does not endorse clear lack of need for sleep, grandiosity, or increased goal-directed activity. She rather describes this period as  highly anxious wherein she couldn't sleep and staid up all night engaging in activities such as cleaning.   In discussing her personal life, she explains that she is single and cares for her cats. While she has a few friends she describes little in the way of a support network and comments that she has historically been someone who prioritizes the health of others over herself.   She reports that two weeks ago her symptoms were at there most recent severe and resulted in a low-lethality overdose and creating a mild laceration on her left forearm. She reports that yesterday prior to presenting to [facility] she was again feeling despondent and overwhelmed and was having thoughts of overdosing again.   Past Psychiatric History: Bipolar 1, posttraumatic stress disorder, borderline personality disorder, GAD, fibromyalgia, chronic pain with chronic opioid use, migraines, chronic benzodiazepine use. History of NSSIB (cutting).  Past Medical History: asthma, hypertension, hyperlipidemia, t2dm, abnormal pap smear (LSIL), GERD, Status post left hip replacement with subsequent nerve damage.  Status post gastric bypass with poor absorption.  Iron deficiency anemia for which the patient receives infusions.  Family History: Both parents deceased. Both parents had CAD, HTN. Mother had Lung cancer, COPD, fibromyalgia, rheumatoid arthritis. Father had T2dm, hodgkin's lymphoma.  Family Psychiatric History: Brother died of overdose  Social History: Single, owns cats. Recently living with her sister. Previously working full-time as an Film/video editor, however, not since her pain exacerbated. She has lately been working part-time at [employer].    Allergies:   Allergies  Allergen Reactions   Ciprofloxacin Itching   Wound Dressing Adhesive Other (See Comments)    Red skin, eats into skin, becomes bubbly   Lab Results:  Results for orders placed or performed during the hospital encounter of 10/17/23 (from the  past 48 hours)  CBC with Differential/Platelet     Status: Abnormal   Collection Time: 10/17/23 11:58 PM  Result Value Ref Range   WBC 11.0 (H) 4.0 - 10.5 K/uL   RBC 4.99 3.87 - 5.11 MIL/uL   Hemoglobin 13.9 12.0 - 15.0 g/dL   HCT 57.2 63.9 - 53.9 %   MCV 85.6 80.0 - 100.0 fL   MCH 27.9 26.0 - 34.0 pg   MCHC 32.6 30.0 - 36.0 g/dL   RDW 80.3 (H) 88.4 - 84.4 %   Platelets 322 150 - 400 K/uL   nRBC 0.0 0.0 - 0.2 %   Neutrophils Relative % 54 %   Neutro Abs 6.0 1.7 - 7.7 K/uL   Lymphocytes Relative 34 %   Lymphs Abs 3.7 0.7 - 4.0 K/uL   Monocytes Relative 8 %   Monocytes Absolute 0.8 0.1 - 1.0 K/uL   Eosinophils Relative 3 %   Eosinophils Absolute 0.4 0.0 - 0.5 K/uL   Basophils Relative 1 %   Basophils Absolute 0.1 0.0 - 0.1 K/uL   Immature Granulocytes 0 %   Abs Immature Granulocytes 0.04 0.00 - 0.07 K/uL    Comment: Performed at [facility], [address], [place], [place] [address]  Comprehensive metabolic panel     Status: Abnormal   Collection Time: 10/17/23 11:58 PM  Result Value Ref Range   Sodium 134 (L) 135 - 145 mmol/L   Potassium 3.6 3.5 - 5.1 mmol/L   Chloride 98 98 - 111 mmol/L   CO2 25 22 - 32 mmol/L   Glucose, Bld 108 (H) 70 - 99 mg/dL    Comment: Glucose reference range applies only to samples taken after fasting for at least 8 hours.   BUN 7 6 - 20 mg/dL   Creatinine, Ser 9.23 0.44 - 1.00 mg/dL   Calcium  9.4 8.9 - 10.3 mg/dL   Total Protein 7.1 6.5 - 8.1 g/dL   Albumin 4.4 3.5 - 5.0 g/dL   AST 26 15 - 41 U/L   ALT 33 0 - 44 U/L   Alkaline Phosphatase 58 38 - 126 U/L   Total Bilirubin 0.5 0.0 - 1.2 mg/dL   GFR, Estimated >39 >39 mL/min    Comment: (NOTE) Calculated using the CKD-EPI Creatinine Equation (2021)    Anion gap 11 5 - 15    Comment: Performed at [facility], [address] [place], [place] [address]  Ethanol     Status: None   Collection Time: 10/17/23 11:58 PM  Result Value Ref Range   Alcohol, Ethyl (B) <15 <15 mg/dL     Comment: (NOTE) For medical purposes only. Performed at [facility], [address] [place], [place] [address]   TSH     Status: None   Collection Time: 10/17/23 11:58 PM  Result Value Ref Range   TSH 1.755 0.350 - 4.500 uIU/mL    Comment: Performed by a 3rd Generation assay with a functional sensitivity of <=0.01 uIU/mL. Performed at [facility], [address] [place], [place] [address]   POC urine preg, ED     Status: None   Collection Time: 10/17/23 11:58 PM  Result Value Ref Range   Preg Test, Ur Negative Negative  POCT Urine Drug Screen - (I-Screen)     Status: Abnormal   Collection Time: 10/17/23 11:58 PM  Result Value  Ref Range   POC Amphetamine UR None Detected NONE DETECTED (Cut Off Level 1000 ng/mL)   POC Secobarbital (BAR) None Detected NONE DETECTED (Cut Off Level 300 ng/mL)   POC Buprenorphine (BUP) None Detected NONE DETECTED (Cut Off Level 10 ng/mL)   POC Oxazepam (BZO) None Detected NONE DETECTED (Cut Off Level 300 ng/mL)   POC Cocaine UR None Detected NONE DETECTED (Cut Off Level 300 ng/mL)   POC Methamphetamine UR None Detected NONE DETECTED (Cut Off Level 1000 ng/mL)   POC Morphine  Positive (A) NONE DETECTED (Cut Off Level 300 ng/mL)   POC Methadone UR None Detected NONE DETECTED (Cut Off Level 300 ng/mL)   POC Oxycodone  UR None Detected NONE DETECTED (Cut Off Level 100 ng/mL)   POC Marijuana UR Positive (A) NONE DETECTED (Cut Off Level 50 ng/mL)  Glucose, capillary     Status: Abnormal   Collection Time: 10/18/23  1:29 PM  Result Value Ref Range   Glucose-Capillary 129 (H) 70 - 99 mg/dL    Comment: Glucose reference range applies only to samples taken after fasting for at least 8 hours.    Blood Alcohol level:  Lab Results  Component Value Date   [facility] <15 10/17/2023    Metabolic Disorder Labs:  Lab Results  Component Value Date   HGBA1C 6.1 06/27/2023   MPG 163 12/15/2019   No results found for: PROLACTIN Lab Results  Component  Value Date   CHOL 136 06/27/2023   TRIG 70 06/27/2023   HDL 52 06/27/2023   CHOLHDL 2.6 06/27/2023   VLDL 65.7 02/20/2023   LDLCALC 69 06/27/2023   LDLCALC 121 (H) 02/20/2023    Current Medications: Current Facility-Administered Medications  Medication Dose Route Frequency Provider Last Rate Last Admin   acetaminophen  (TYLENOL ) tablet 650 mg  650 mg Oral Q6H PRN Delsie Lynwood Morene Lavone, MD   650 mg at 10/19/23 9371   albuterol  (VENTOLIN  HFA) 108 (90 Base) MCG/ACT inhaler 2 puff  2 puff Inhalation Q6H PRN Delsie Lynwood Morene Lavone, MD       alum & mag hydroxide-simeth (MAALOX/MYLANTA) 200-200-20 MG/5ML suspension 30 mL  30 mL Oral Q4H PRN Delsie Lynwood Morene Lavone, MD       [START ON 10/20/2023] buPROPion  (WELLBUTRIN  XL) 24 hr tablet 150 mg  150 mg Oral Daily Linder Prajapati A, DO       clonazePAM  (KLONOPIN ) tablet 0.5 mg  0.5 mg Oral BID Delsie Lynwood Morene Lavone, MD   0.5 mg at 10/19/23 0758   haloperidol  (HALDOL ) tablet 5 mg  5 mg Oral TID PRN Delsie Lynwood Morene Lavone, MD       And   diphenhydrAMINE  (BENADRYL ) capsule 50 mg  50 mg Oral TID PRN Delsie Lynwood Morene Lavone, MD       haloperidol  lactate (HALDOL ) injection 5 mg  5 mg Intramuscular TID PRN Delsie Lynwood Morene Lavone, MD       And   diphenhydrAMINE  (BENADRYL ) injection 50 mg  50 mg Intramuscular TID PRN Delsie Lynwood Morene Lavone, MD       And   LORazepam  (ATIVAN ) injection 2 mg  2 mg Intramuscular TID PRN Delsie Lynwood Morene Lavone, MD       haloperidol  lactate (HALDOL ) injection 10 mg  10 mg Intramuscular TID PRN Delsie Lynwood Morene Lavone, MD       And   diphenhydrAMINE  (BENADRYL ) injection 50 mg  50 mg Intramuscular TID PRN Delsie Lynwood Morene Lavone, MD       And  LORazepam  (ATIVAN ) injection 2 mg  2 mg Intramuscular TID PRN Delsie Lynwood Morene Lavone, MD       feeding supplement (ENSURE ENLIVE / ENSURE PLUS) liquid 237 mL  237 mL Oral BID BM Delsie Lynwood Morene Lavone, MD       HYDROcodone -acetaminophen  (NORCO/VICODIN) 5-325 MG per tablet 1 tablet  1 tablet Oral Q6H PRN Prentis Kitchens A, DO   1 tablet at 10/19/23 1508   losartan  (COZAAR ) tablet 50 mg  50 mg Oral Daily Delsie Lynwood Morene Lavone, MD   50 mg at 10/19/23 9241   lumateperone  tosylate (CAPLYTA ) capsule 42 mg  42 mg Oral Daily Prentis Kitchens A, DO   42 mg at 10/19/23 1508   magnesium  hydroxide (MILK OF MAGNESIA) suspension 30 mL  30 mL Oral Daily PRN Delsie Lynwood Morene Lavone, MD       methocarbamol  (ROBAXIN ) tablet 750 mg  750 mg Oral Q8H PRN Delsie Lynwood Morene Lavone, MD   750 mg at 10/19/23 1512   multivitamin with minerals tablet 1 tablet  1 tablet Oral Daily Cloey Sferrazza A, DO       ondansetron  (ZOFRAN -ODT) disintegrating tablet 4 mg  4 mg Oral Q6H PRN Delsie Lynwood Morene Lavone, MD   4 mg at 10/19/23 0800   pantoprazole  (PROTONIX ) EC tablet 40 mg  40 mg Oral Daily Delsie Lynwood Morene Lavone, MD   40 mg at 10/19/23 9240   pregabalin  (LYRICA ) capsule 200 mg  200 mg Oral BID Prentis Kitchens LABOR, DO       QUEtiapine  (SEROQUEL ) tablet 400 mg  400 mg Oral QHS Delsie Lynwood Morene Lavone, MD   400 mg at 10/18/23 2126   rosuvastatin  (CRESTOR ) tablet 5 mg  5 mg Oral Daily Delsie Lynwood Morene Lavone, MD   5 mg at 10/19/23 9140   SUMAtriptan  (IMITREX ) tablet 50 mg  50 mg Oral Q2H PRN Delsie Lynwood Morene Lavone, MD       traZODone  (DESYREL ) tablet 100 mg  100 mg Oral QHS PRN Delsie Lynwood Morene Lavone, MD   100 mg at 10/18/23 2126   vitamin B-12 (CYANOCOBALAMIN ) tablet 100 mcg  100 mcg Oral Daily Prentis Kitchens A, DO   100 mcg at 10/19/23 1509   PTA Medications: No medications prior to admission.    Musculoskeletal: Normal gait and station  Mental Status Exam: Appearance - Casually dressed, appropriate hygiene Attitude - Pleasant, calm Speech - normal volume, prosody, inflection Mood - Sad Affect - Restricted, tearful at  times Thought Process - LLGD Thought Content - No delusional TC expressed SI/HI - Denies Perceptions - Denies; not RIS Judgement/Insight - Good Fund of knowledge - WNL Language - No impairments    Physical Exam Constitutional:      Appearance: Normal appearance.  HENT:     Head: Normocephalic and atraumatic.  Pulmonary:     Effort: Pulmonary effort is normal.  Musculoskeletal:        General: Normal range of motion.  Neurological:     Mental Status: She is alert.    Review of Systems  Constitutional: Negative.   Respiratory: Negative.    Cardiovascular: Negative.    Blood pressure 104/66, pulse 86, temperature 97.8 F (36.6 C), temperature source Oral, resp. rate 16, height 6' 2 (1.88 m), weight 77.6 kg, last menstrual period 09/22/2023, SpO2 100%. Body mass index is 21.96 kg/m.  Assessment and Plan: Ms. Amilya Haver is a 48 y/o female with a history of depression and anxiety admitted voluntarily  to inpatient psychiatry for suicidal ideation in the context of multiple stressors, that include financial hardship and disability from chronic pain. While she reports a diagnosis of BPAD she does not clearly endorse clear symptoms consistent with a manic or hypomanic episode.   # Mood Disorder, unspecified # Generalized Anxiety Disorder - Continue caplyta  42 mg daily - Quetiapine  400 mg at bedtime - Hold Auvelity. Wellbutrin  XL 150 mg daily - Clonazepam  0.5 mg BID prn   # Chronic pain - Hydrocodone  5-325 mg q6h prn - Increase Lyrica  to 200 mg BID  Observation Level/Precautions:  15 minute checks  Laboratory:  Reviewed and unremarkable  Psychotherapy:  Group  Medications:  As above  Consultations:  SW  Discharge Concerns:  None  Estimated LOS: 3-5 days  Other:     Physician Treatment Plan for Primary Diagnosis: Mood disorder (HCC) Long Term Goal(s): Improvement in symptoms so as ready for discharge  Short Term Goals: Ability to verbalize feelings will improve,  Ability to disclose and discuss suicidal ideas, and Ability to identify and develop effective coping behaviors will improve  Physician Treatment Plan for Secondary Diagnosis: Principal Problem:   Mood disorder (HCC) Active Problems:   Generalized anxiety disorder   I certify that inpatient services furnished can reasonably be expected to improve the patient's condition.    Oliva DELENA Salmon, DO 9/6/20253:44 PM

## 2023-10-19 NOTE — [facility] Group Notes (Signed)
 [facility] Group Notes:  (Nursing/MHT/Case Management/Adjunct)  Date:  10/19/2023  Time:  9:21 PM  Type of Therapy:  Wrap-up group  Participation Level:  Active  Participation Quality:  Appropriate  Affect:  Appropriate  Cognitive:  Appropriate  Insight:  Appropriate  Engagement in Group:  Engaged  Modes of Intervention:  Education  Summary of Progress/Problems:Goal to learn the program. Rated day 7/10.   Heidi Holland 10/19/2023, 9:21 PM

## 2023-10-19 NOTE — Progress Notes (Signed)
 Patient denies SI, HI & AVH. Patient does endorse sadness and depression although. No new issues to report on shift at this time.

## 2023-10-19 NOTE — Plan of Care (Signed)
   Problem: Education: Goal: Emotional status will improve Outcome: Progressing

## 2023-10-19 NOTE — Group Note (Signed)
 Date:  10/19/2023 Time:  9:01 AM  Group Topic/Focus:  Goals Group:   The focus of this group is to help patients establish daily goals to achieve during treatment and discuss how the patient can incorporate goal setting into their daily lives to aide in recovery.    Participation Level:  Active  Participation Quality:  Appropriate  Affect:  Appropriate  Cognitive:  Appropriate  Insight: Appropriate  Engagement in Group:  Engaged  Modes of Intervention:  Discussion  Additional Comments:  NA  Siham Bucaro A Ji Fairburn 10/19/2023, 9:01 AM

## 2023-10-19 NOTE — Plan of Care (Signed)
   Problem: Education: Goal: Knowledge of [facility] Education information/materials will improve Outcome: Progressing Goal: Emotional status will improve Outcome: Progressing Goal: Mental status will improve Outcome: Progressing Goal: Verbalization of understanding the information provided will improve Outcome: Progressing

## 2023-10-20 DIAGNOSIS — F411 Generalized anxiety disorder: Secondary | ICD-10-CM

## 2023-10-20 DIAGNOSIS — F39 Unspecified mood [affective] disorder: Secondary | ICD-10-CM

## 2023-10-20 DIAGNOSIS — G8929 Other chronic pain: Secondary | ICD-10-CM

## 2023-10-20 MED ORDER — TRAZODONE HCL 150 MG PO TABS: 150.0000 mg | ORAL_TABLET | Freq: Every evening | ORAL | Status: DC | PRN

## 2023-10-20 MED ORDER — ZOLPIDEM TARTRATE 5 MG PO TABS
5.0000 mg | ORAL_TABLET | Freq: Every evening | ORAL | Status: DC | PRN
  Administered 2023-10-20 – 2023-10-22 (×3): 5 mg via ORAL
  Filled 2023-10-20 (×3): qty 1

## 2023-10-20 NOTE — Plan of Care (Signed)
   Problem: Education: Goal: Emotional status will improve Outcome: Progressing Goal: Mental status will improve Outcome: Progressing   Problem: Activity: Goal: Interest or engagement in activities will improve Outcome: Progressing

## 2023-10-20 NOTE — Group Note (Signed)
 Date:  10/20/2023 Time:  5:35 PM  Group Topic/Focus:  The focus of this group is to introduce the topic of wellness and how collage can be used as a creative outlet for expressing emotions, reducing anxiety, while fostering group cohesion and enabling personal insight and healing.    Participation Level:  Active  Participation Quality:  Appropriate and Attentive  Affect:  Appropriate  Cognitive:  Alert and Appropriate  Insight: Appropriate and Good  Engagement in Group:  Engaged  Modes of Intervention:  Activity, Discussion, Education, Exploration, Rapport Building, Socialization, and Support  Additional Comments:    Berwyn GORMAN Acosta 10/20/2023, 5:35 PM

## 2023-10-20 NOTE — Progress Notes (Signed)
 [facility] Inpatient Psychiatry Progress Note  Date: 10/20/23 [name] MRN: [id number]  Assessment and Plan: [name] is a 48 y/o female with a history of depression and anxiety admitted voluntarily to inpatient psychiatry for suicidal ideation in the context of multiple stressors, that include financial hardship and disability from chronic pain. While she reports a diagnosis of BPAD she does not clearly endorse clear symptoms consistent with a manic or hypomanic episode.   9/7 - Improvement since admission. Resolution of SI. Pain continues to be a chronic issue. She is interested in participating in an IOP program. Likely appropriate for discharge mid week.    # Mood Disorder, unspecified # Generalized Anxiety Disorder - Continue caplyta  42 mg daily - Quetiapine  400 mg at bedtime - Hold Auvelity. Wellbutrin  XL 150 mg daily - Clonazepam  0.5 mg BID prn    # Chronic pain - Hydrocodone  5-325 mg q6h prn -  Lyrica  to 200 mg BID  Risk Assessment - Low  Discharge Planning Estimated length of stay:  Predicted Discharge location: Home   Interval History and update: Chart reviewed. No significant events overnight. Patient has been med compliant. She reports not sleeping well last night due to knee and back pain. She reported feeling well today and that her mood was improved. She denied suicidal ideation. She is tolerating the higher dose of pregabalin  at 200 mg but reports it is mildly sedating. She reports that anxiety and distress is a bit improved.       Physical Exam MSK/Neuro - Normal gait and station Mental Status Exam Appearance - Casually dressed, appropriate hygiene Attitude - Casually dressed Speech - normal volume, prosody, inflection Mood - Okay Affect - Mildly restricted, appropriately reactive Thought Process - LLGD Thought Content - No delusional TC expressed SI/HI - Denies Perceptions - Denies AVH; not  RIS Judgement/Insight - Good Fund of knowledge - WNL Language - No impairments      Lab Results:  Admission on 10/18/2023  Component Date Value Ref Range Status   Vit D, 25-Hydroxy 10/19/2023 31.13  30 - 100 ng/mL Final     Vitals: Blood pressure 111/60, pulse 82, temperature 97.9 F (36.6 C), temperature source Oral, resp. rate 16, height 6' 2 (1.88 m), weight 77.6 kg, last menstrual period 09/22/2023, SpO2 100%.    Oliva DELENA Salmon, DO

## 2023-10-20 NOTE — Group Note (Signed)
 Date:  10/20/2023 Time:  9:25 AM  Group Topic/Focus:  Goals Group:   The focus of this group is to help patients establish daily goals to achieve during treatment and discuss how the patient can incorporate goal setting into their daily lives to aide in recovery.    Participation Level:  Active  Participation Quality:  Appropriate and Attentive  Affect:  Appropriate  Cognitive:  Alert and Appropriate  Insight: Appropriate and Improving  Engagement in Group:  Engaged and Improving  Modes of Intervention:  Discussion and Exploration  Additional Comments:  Pt attended and participated in goals group.  Kristi HERO Jamilet Ambroise 10/20/2023, 9:25 AM

## 2023-10-20 NOTE — Progress Notes (Signed)
   10/19/23 2130  Psych Admission Type (Psych Patients Only)  Admission Status Voluntary  Psychosocial Assessment  Patient Complaints Worrying  Eye Contact Fair  Facial Expression Flat  Affect Appropriate to circumstance  Speech Logical/coherent  Interaction Assertive  Motor Activity Other (Comment) (wnl)  Appearance/Hygiene Unremarkable  Behavior Characteristics Appropriate to situation  Mood Pleasant  Thought Process  Coherency WDL  Content WDL  Delusions None reported or observed  Perception WDL  Hallucination None reported or observed  Judgment Poor  Confusion None  Danger to Self  Current suicidal ideation? Denies

## 2023-10-20 NOTE — Progress Notes (Signed)
(  Sleep Hours) - (Any PRNs that were needed, meds refused, or side effects to meds)-  Norco 2114 Robaxin  2314 tylenol  2315 (Any disturbances and when (visitation, over night)- none (Concerns raised by the patient)- pain management (SI/HI/AVH)- SI on admission, denies now

## 2023-10-20 NOTE — [facility] Group Notes (Signed)
 [facility] Group Notes:  (Nursing/MHT/Case Management/Adjunct)  Date:  10/20/2023  Time:  2000  Type of Therapy:  Wrap up group  Participation Level:  Active  Participation Quality:  Appropriate, Attentive, Sharing, and Supportive  Affect:  Appropriate  Cognitive:  Alert  Insight:  Improving  Engagement in Group:  Engaged  Modes of Intervention:  Clarification, Education, and Support  Summary of Progress/Problems: Positive thinking and positive change were discussed.   Lenora Shaver S 10/20/2023, 9:25 PM

## 2023-10-20 NOTE — Plan of Care (Signed)
   Problem: Education: Goal: Emotional status will improve Outcome: Progressing Goal: Mental status will improve Outcome: Progressing

## 2023-10-20 NOTE — Group Note (Signed)
 [facility] LCSW Group Therapy Note  Date/Time:  @TD @ 10:00-11:00  Type of Therapy and Topic:  Group Therapy:  Personal Bill of Rights  Participation Level:  Active   Description of Group This process group involved a discussion with and between patients about Understanding self.   The difference between healthy and unhealthy coping skills was described, then examples were elicited from group members for their Personal bill of Rights.  This then was followed by a discussion about boundaries, respects and what the patient wants, what it is, how important it is, and why we choose the coping techniques we choose.  Participants were encouraged to think about knowing what they want as necessary and positive.  Therapeutic Goals Patient will identify and describe what their boundary consists of Patient will participate in generating ideas when they use their coping skills to address their boundary  Patients will be supportive of one another and receive support from others Patients will understand the need all humans have to   Summary of Patient Progress:  The patient was engaged and share her thoughts with the group   Therapeutic Modalities Brief Solution-Focused Therapy Psychoeducation  Heidi Holland, LCSWA 10/20/2023  1:38 PM

## 2023-10-20 NOTE — Progress Notes (Signed)
   10/20/23 2002  Psych Admission Type (Psych Patients Only)  Admission Status Voluntary  Psychosocial Assessment  Patient Complaints Depression;Worrying  Eye Contact Fair  Facial Expression Flat  Affect Appropriate to circumstance  Speech Logical/coherent  Interaction Assertive  Motor Activity Other (Comment) (wnl)  Appearance/Hygiene Unremarkable  Behavior Characteristics Cooperative;Appropriate to situation  Mood Pleasant  Thought Process  Coherency WDL  Content WDL  Delusions None reported or observed  Perception WDL  Hallucination None reported or observed  Judgment Poor  Confusion None  Danger to Self  Current suicidal ideation? Denies

## 2023-10-20 NOTE — Progress Notes (Addendum)
 D. Pt was friendly upon approach, observed interacting well with peers and attending group. Pt reported poor sleep last night, described her appetite and concentration as 'good', and energy level as 'normal'. Per pt's self inventory, pt rated her depression,hopelessness and anxiety a 6/2/5, respectively. Pt's stated goal today is to rest, journal, talk and breathing.  Pt currently denies SI/HI and AVH and doesn't appear to be responding to internal stimuli.  A. Labs and vitals monitored. Pt given and educated on medications. Pt given MOM for complaints of constipation. Pt supported emotionally and encouraged to express concerns and ask questions.   R. Pt remains safe with 15 minute checks. Will continue POC.    10/20/23 0900  Psych Admission Type (Psych Patients Only)  Admission Status Voluntary  Psychosocial Assessment  Patient Complaints Anxiety;Depression  Eye Contact Fair  Facial Expression Anxious  Affect Appropriate to circumstance  Speech Logical/coherent  Interaction Assertive  Motor Activity Other (Comment) (steady gait)  Appearance/Hygiene Unremarkable  Behavior Characteristics Cooperative;Appropriate to situation  Mood Pleasant  Thought Process  Coherency WDL  Content WDL  Delusions None reported or observed  Perception WDL  Hallucination None reported or observed  Judgment Poor  Confusion None  Danger to Self  Current suicidal ideation? Denies  Self-Injurious Behavior No self-injurious ideation or behavior indicators observed or expressed   Agreement Not to Harm Self Yes  Description of Agreement agreed to contact staff before acting on harmful thoughts

## 2023-10-20 NOTE — Group Note (Signed)
 Date:  10/20/2023 Time:  10:06 AM  Group Topic/Focus:  Coping Mechanisms This group focused on coping mechanisms and understanding the importance of developing healthier ways to manage stress, emotional distress, and mental health challenges. The group explored healthy vs. nonhealthy coping mechanisms, emphasizing how some behaviors like substance abuse or avoidance can worsen mental health, while others like adequate sleep and food and relaxation techniques can lead to better emotional regulation and overall well-being.  A worksheet was provided to participants and highlighted adaptive vs. maladaptive coping mechanisms. Participants were encouraged to share their personal experiences and engage in conversation focused on resilience, improving self-awareness, and practicing deep breathing as a positive coping mechanism.    Participation Level:  Active  Participation Quality:  Appropriate and Attentive  Affect:  Appropriate  Cognitive:  Alert and Appropriate  Insight: Appropriate and Improving  Engagement in Group:  Engaged and Improving  Modes of Intervention:  Discussion, Education, and Problem-solving  Additional Comments:  Pt attended and participated in this group.  Kristi HERO Renner Sebald 10/20/2023, 10:06 AM

## 2023-10-21 ENCOUNTER — Encounter (HOSPITAL_COMMUNITY): Payer: Self-pay

## 2023-10-21 LAB — LIPID PANEL
Cholesterol: 113 mg/dL (ref 0–200)
HDL: 38 mg/dL — ABNORMAL LOW (ref >40)
LDL Cholesterol: 38 mg/dL (ref 0–99)
Total CHOL/HDL Ratio: 3 ratio
Triglycerides: 185 mg/dL — ABNORMAL HIGH (ref <150)
VLDL: 37 mg/dL (ref 0–40)

## 2023-10-21 LAB — HEMOGLOBIN A1C
Hgb A1c MFr Bld: 5.3 % (ref 4.8–5.6)
Mean Plasma Glucose: 105.41 mg/dL

## 2023-10-21 MED ORDER — POLYETHYLENE GLYCOL 3350 17 G PO PACK
17.0000 g | PACK | Freq: Two times a day (BID) | ORAL | Status: DC
  Administered 2023-10-21 – 2023-10-23 (×2): 17 g via ORAL
  Filled 2023-10-21 (×2): qty 1

## 2023-10-21 NOTE — BH IP Treatment Plan (Signed)
 Interdisciplinary Treatment and Diagnostic Plan Update  10/21/2023 Time of Session: 11:00AM Heidi Holland MRN: [id number]  Principal Diagnosis: Mood disorder [facility])  Secondary Diagnoses: Principal Problem:   Mood disorder (HCC) Active Problems:   Generalized anxiety disorder   Current Medications:  Current Facility-Administered Medications  Medication Dose Route Frequency Provider Last Rate Last Admin   acetaminophen  (TYLENOL ) tablet 650 mg  650 mg Oral Q6H PRN Delsie Lynwood Morene Lavone, MD   650 mg at 10/21/23 1206   albuterol  (VENTOLIN  HFA) 108 (90 Base) MCG/ACT inhaler 2 puff  2 puff Inhalation Q6H PRN Delsie Lynwood Morene Lavone, MD       alum & mag hydroxide-simeth (MAALOX/MYLANTA) 200-200-20 MG/5ML suspension 30 mL  30 mL Oral Q4H PRN Delsie Lynwood Morene Lavone, MD       buPROPion  (WELLBUTRIN  XL) 24 hr tablet 150 mg  150 mg Oral Daily Prentis Kitchens A, DO   150 mg at 10/21/23 0754   clonazePAM  (KLONOPIN ) tablet 0.5 mg  0.5 mg Oral BID Delsie Lynwood Morene Lavone, MD   0.5 mg at 10/21/23 9245   haloperidol  (HALDOL ) tablet 5 mg  5 mg Oral TID PRN Delsie Lynwood Morene Lavone, MD       And   diphenhydrAMINE  (BENADRYL ) capsule 50 mg  50 mg Oral TID PRN Delsie Lynwood Morene Lavone, MD       haloperidol  lactate (HALDOL ) injection 5 mg  5 mg Intramuscular TID PRN Delsie Lynwood Morene Lavone, MD       And   diphenhydrAMINE  (BENADRYL ) injection 50 mg  50 mg Intramuscular TID PRN Delsie Lynwood Morene Lavone, MD       And   LORazepam  (ATIVAN ) injection 2 mg  2 mg Intramuscular TID PRN Delsie Lynwood Morene Lavone, MD       haloperidol  lactate (HALDOL ) injection 10 mg  10 mg Intramuscular TID PRN Delsie Lynwood Morene Lavone, MD       And   diphenhydrAMINE  (BENADRYL ) injection 50 mg  50 mg Intramuscular TID PRN Delsie Lynwood Morene Lavone, MD       And   LORazepam  (ATIVAN ) injection 2 mg  2 mg Intramuscular TID PRN Delsie Lynwood Morene Lavone,  MD       feeding supplement (ENSURE ENLIVE / ENSURE PLUS) liquid 237 mL  237 mL Oral BID BM Delsie Lynwood Morene Lavone, MD       HYDROcodone -acetaminophen  (NORCO/VICODIN) 5-325 MG per tablet 1 tablet  1 tablet Oral Q6H PRN Prentis Kitchens A, DO   1 tablet at 10/21/23 1307   losartan  (COZAAR ) tablet 50 mg  50 mg Oral Daily Delsie Lynwood Morene Lavone, MD   50 mg at 10/21/23 9245   lumateperone  tosylate (CAPLYTA ) capsule 42 mg  42 mg Oral Daily Prentis Kitchens A, DO   42 mg at 10/21/23 0754   magnesium  hydroxide (MILK OF MAGNESIA) suspension 30 mL  30 mL Oral Daily PRN Delsie Lynwood Morene Lavone, MD   30 mL at 10/20/23 0825   methocarbamol  (ROBAXIN ) tablet 750 mg  750 mg Oral Q8H PRN Delsie Lynwood Morene Lavone, MD   750 mg at 10/21/23 0615   multivitamin with minerals tablet 1 tablet  1 tablet Oral Daily Prentis Kitchens A, DO   1 tablet at 10/21/23 0754   ondansetron  (ZOFRAN -ODT) disintegrating tablet 4 mg  4 mg Oral Q6H PRN Delsie Lynwood Morene Lavone, MD   4 mg at 10/20/23 1806   pantoprazole  (PROTONIX ) EC tablet 40 mg  40 mg Oral Daily Delsie Lynwood  Morene Deems, MD   40 mg at 10/21/23 0754   polyethylene glycol (MIRALAX  / GLYCOLAX ) packet 17 g  17 g Oral BID Ntuen, Tina C, FNP       pregabalin  (LYRICA ) capsule 200 mg  200 mg Oral BID Prentis Kitchens A, DO   200 mg at 10/21/23 9245   QUEtiapine  (SEROQUEL ) tablet 400 mg  400 mg Oral QHS Delsie Lynwood Morene Deems, MD   400 mg at 10/20/23 2128   rosuvastatin  (CRESTOR ) tablet 5 mg  5 mg Oral Daily Delsie Lynwood Morene Deems, MD   5 mg at 10/21/23 9245   senna (SENOKOT) tablet 8.6 mg  1 tablet Oral QHS Prentis Kitchens A, DO   8.6 mg at 10/20/23 2127   SUMAtriptan  (IMITREX ) tablet 50 mg  50 mg Oral Q2H PRN Delsie Lynwood Morene Deems, MD       traZODone  (DESYREL ) tablet 150 mg  150 mg Oral QHS PRN Prentis Kitchens A, DO       vitamin B-12 (CYANOCOBALAMIN ) tablet 100 mcg  100 mcg Oral Daily Prentis Kitchens A, DO   100  mcg at 10/21/23 0754   zolpidem  (AMBIEN ) tablet 5 mg  5 mg Oral QHS PRN Prentis Kitchens A, DO   5 mg at 10/20/23 2128   PTA Medications: No medications prior to admission.    Patient Stressors: Financial difficulties   Health problems   Occupational concerns    Patient Strengths: Ability for insight  Capable of independent living  Communication skills  Motivation for treatment/growth  Supportive family/friends   Treatment Modalities: Medication Management, Group therapy, Case management,  1 to 1 session with clinician, Psychoeducation, Recreational therapy.   Physician Treatment Plan for Primary Diagnosis: Mood disorder (HCC) Long Term Goal(s): Improvement in symptoms so as ready for discharge   Short Term Goals: Ability to verbalize feelings will improve Ability to disclose and discuss suicidal ideas Ability to identify and develop effective coping behaviors will improve  Medication Management: Evaluate patient's response, side effects, and tolerance of medication regimen.  Therapeutic Interventions: 1 to 1 sessions, Unit Group sessions and Medication administration.  Evaluation of Outcomes: Not Progressing  Physician Treatment Plan for Secondary Diagnosis: Principal Problem:   Mood disorder (HCC) Active Problems:   Generalized anxiety disorder  Long Term Goal(s): Improvement in symptoms so as ready for discharge   Short Term Goals: Ability to verbalize feelings will improve Ability to disclose and discuss suicidal ideas Ability to identify and develop effective coping behaviors will improve     Medication Management: Evaluate patient's response, side effects, and tolerance of medication regimen.  Therapeutic Interventions: 1 to 1 sessions, Unit Group sessions and Medication administration.  Evaluation of Outcomes: Not Progressing   RN Treatment Plan for Primary Diagnosis: Mood disorder (HCC) Long Term Goal(s): Knowledge of disease and therapeutic regimen to  maintain health will improve  Short Term Goals: Ability to remain free from injury will improve, Ability to verbalize frustration and anger appropriately will improve, Ability to demonstrate self-control, Ability to participate in decision making will improve, Ability to verbalize feelings will improve, Ability to disclose and discuss suicidal ideas, Ability to identify and develop effective coping behaviors will improve, and Compliance with prescribed medications will improve  Medication Management: RN will administer medications as ordered by provider, will assess and evaluate patient's response and provide education to patient for prescribed medication. RN will report any adverse and/or side effects to prescribing provider.  Therapeutic Interventions: 1 on 1 counseling sessions, Psychoeducation, Medication administration,  Evaluate responses to treatment, Monitor vital signs and CBGs as ordered, Perform/monitor CIWA, COWS, AIMS and Fall Risk screenings as ordered, Perform wound care treatments as ordered.  Evaluation of Outcomes: Not Progressing   LCSW Treatment Plan for Primary Diagnosis: Mood disorder (HCC) Long Term Goal(s): Safe transition to appropriate next level of care at discharge, Engage patient in therapeutic group addressing interpersonal concerns.  Short Term Goals: Engage patient in aftercare planning with referrals and resources, Increase social support, Increase ability to appropriately verbalize feelings, Increase emotional regulation, Facilitate acceptance of mental health diagnosis and concerns, and Increase skills for wellness and recovery  Therapeutic Interventions: Assess for all discharge needs, 1 to 1 time with Social worker, Explore available resources and support systems, Assess for adequacy in community support network, Educate family and significant other(s) on suicide prevention, Complete Psychosocial Assessment, Interpersonal group therapy.  Evaluation of Outcomes:  Not Progressing   Progress in Treatment: Attending groups: Yes. Participating in groups: Yes. Taking medication as prescribed: Yes. Toleration medication: Yes. Family/Significant other contact made: Yes, individual(s) contacted:  Heidi Holland, sister, [phone] Patient understands diagnosis: Yes. Discussing patient identified problems/goals with staff: Yes. Medical problems stabilized or resolved: Yes. Denies suicidal/homicidal ideation: Yes. Issues/concerns per patient self-inventory: No.   Patient Goals:  to be able to grieve and get out of survival mode. To be able to function.  Discharge Plan or Barriers: Patient likely to discharge home once stable.  Reason for Continuation of Hospitalization: Anxiety Depression Medication stabilization  Estimated Length of Stay: 3-5 days  Last 3 [place] Suicide Severity Risk Score: Flowsheet Row [facility] from 10[facility]17/2023 in [facility] from [facility]/09/2023 [facility] RISK CATEGORY High Risk High Risk No Risk    Last PHQ 2/9 Scores:    10/16/2023    4:31 PM 06/27/2023    9:20 AM 02/20/2023    8:51 AM  Depression screen PHQ 2/9  Decreased Interest 1 0 1  Down, Depressed, Hopeless 2 0 1  PHQ - 2 Score 3 0 2  Altered sleeping 2 0 3  Tired, decreased energy 2 0 3  Change in appetite 3 0 2  Feeling bad or failure about yourself  0 0 2  Trouble concentrating 0 0 1  Moving slowly or fidgety/restless 0 0 0  Suicidal thoughts 0 0 0  PHQ-9 Score 10 0 13  Difficult doing work/chores  Not difficult at all Very difficult    Scribe for Treatment Team: Heidi Holland, Heidi Holland 10/21/2023 4:02 PM

## 2023-10-21 NOTE — [facility] Group Notes (Signed)
 Adult Psychoeducational Group Note  Date:  10/21/2023 Time:  9:34 PM  Group Topic/Focus:  Wrap-Up Group:   The focus of this group is to help patients review their daily goal of treatment and discuss progress on daily workbooks.  Participation Level:  Did Not Attend  Aisha Celestine Ruth 10/21/2023, 9:34 PM

## 2023-10-21 NOTE — Progress Notes (Signed)
 Patient's family member brought in home medication, Mounjaro  which she reports her next dose is due tonight. Pt reports medication requires refrigeration, medication placed in 300 hall medication room refrigerator with patient label on it.

## 2023-10-21 NOTE — Group Note (Unsigned)
 Date:  10/21/2023 Time:  8:23 AM  Group Topic/Focus:  Goals Group:   The focus of this group is to help patients establish daily goals to achieve during treatment and discuss how the patient can incorporate goal setting into their daily lives to aide in recovery.     Participation Level:  {BHH PARTICIPATION OZCZO:77735}  Participation Quality:  {BHH PARTICIPATION QUALITY:22265}  Affect:  {BHH AFFECT:22266}  Cognitive:  {BHH COGNITIVE:22267}  Insight: {BHH Insight2:20797}  Engagement in Group:  {BHH ENGAGEMENT IN HMNLE:77731}  Modes of Intervention:  {BHH MODES OF INTERVENTION:22269}  Additional Comments:  ***  Heidi Holland 10/21/2023, 8:23 AM

## 2023-10-21 NOTE — Plan of Care (Signed)

## 2023-10-21 NOTE — Group Note (Signed)
 Therapy Group Note  Group Topic:Other  Group Date: 10/21/2023 Start Time: 1500 End Time: 1530 Facilitators: Keyona Emrich G, OT    The primary objective of this topic is to explore and understand the concept of occupational balance in the context of daily living. The term occupational balance is defined broadly, encompassing all activities that occupy an individual's time and energy, including self-care, leisure, and work-related tasks. The goal is to guide participants towards achieving a harmonious blend of these activities, tailored to their personal values and life circumstances. This balance is aimed at enhancing overall well-being, not by equally distributing time across activities, but by ensuring that daily engagements are fulfilling and not draining. The content delves into identifying various barriers that individuals face in achieving occupational balance, such as overcommitment, misaligned priorities, external pressures, and lack of effective time management. The impact of these barriers on occupational performance, roles, and lifestyles is examined, highlighting issues like reduced efficiency, strained relationships, and potential health problems. Strategies for cultivating occupational balance are a key focus. These strategies include practical methods like time blocking, prioritizing tasks, establishing self-care rituals, decluttering, connecting with nature, and engaging in reflective practices. These approaches are designed to be adaptable and applicable to a wide range of life scenarios, promoting a proactive and mindful approach to daily living. The overall aim is to equip participants with the knowledge and tools to create a balanced lifestyle that supports their mental, emotional, and physical health, thereby improving their functional performance in daily life.     Participation Level: Engaged   Participation Quality: Independent   Behavior: Appropriate   Speech/Thought  Process: Relevant   Affect/Mood: Appropriate   Insight: Fair   Judgement: Fair      Modes of Intervention: Education  Patient Response to Interventions:  Attentive   Plan: Continue to engage patient in OT groups 2 - 3x/week.  10/21/2023  Heidi Holland, OT  Keegen Heffern, OT

## 2023-10-21 NOTE — Progress Notes (Incomplete)
(  Sleep Hours) - 5.5  (Any PRNs that were needed, meds refused, or side effects to meds)-  ambien , robaxin , vicodin (Any disturbances and when (visitation, over night)- none (Concerns raised by the patient)- pt requested ambien  because she reports that she does not take trazodone  (SI/HI/AVH)- denies all

## 2023-10-21 NOTE — Progress Notes (Signed)
 [facility] Inpatient Psychiatry Progress Note  Date: 10/21/23 [name] MRN: [id number]  Reason for admission: [name] is a 48 y/o female with a history of depression and anxiety admitted voluntarily to inpatient psychiatry for suicidal ideation in the context of multiple stressors, that include financial hardship and disability from chronic pain. While she reports a diagnosis of BPAD she does not clearly endorse clear symptoms consistent with a manic or hypomanic episode.   24-hour chart review: Patient case discussed in the interdisciplinary team meeting.  Vital signs reviewed without critical values.  As needed of Tylenol  x 1 for mild pain, hydrocodone  x 3 for moderate pain, Milk of Magnesia x 1 for constipation Robaxin  x 2 for muscle pain, Zofran  x 1 for nausea, Ambien  x 1 for insomnia.  No agitation protocol required.  Today's assessment notes: Patient presents alert, calm, cooperative, and oriented to person, time, place, and situation.  Reports her mood is labile, however, less depressed compared to admission.  Rates depression as #5/10, and anxiety as #5/10, with 10 being more severe.  Reports good appetite and stable sleep.  Compliant with psychotropic medications, and denies any side effects.  Reports interest in IOP program, LCSW to be made aware to follow-up on providing resources for patient.  Reports constipation as Milk of Magnesia did not help.  MiraLAX  twice daily initiated.  Will follow-up with results.  She denies delusional thinking or paranoia.  Further denies SI, HI, or AVH.  On overall, mood is stable and improving.  No changes in her treatment plan today expected discharge in midweek.  Patient reports she is on home medication of Mounjaro  injectables for her weight loss.  9/7 - Improvement since admission. Resolution of SI. Pain continues to be a chronic issue. She is interested in participating in an IOP program. Likely appropriate for  discharge mid week.   # Mood Disorder, unspecified # Generalized Anxiety Disorder - Continue caplyta  42 mg daily - Quetiapine  400 mg at bedtime - Hold Auvelity. Wellbutrin  XL 150 mg daily - Clonazepam  0.5 mg BID prn    # Chronic pain - Hydrocodone  5-325 mg q6h prn -  Lyrica  to 200 mg BID  Risk Assessment - Low  Discharge Planning Estimated length of stay:  Predicted Discharge location: Home  Interval History and update: Chart reviewed. No significant events overnight. Patient has been med compliant. She reports not sleeping well last night due to knee and back pain. She reported feeling well today and that her mood was improved. She denied suicidal ideation. She is tolerating the higher dose of pregabalin  at 200 mg but reports it is mildly sedating. She reports that anxiety and distress is a bit improved.   Physical Exam MSK/Neuro - Normal gait and station Mental Status Exam Appearance - Casually dressed, appropriate hygiene Attitude - Casually dressed Speech - normal volume, prosody, inflection Mood - Okay Affect - Mildly restricted, appropriately reactive Thought Process - LLGD Thought Content - No delusional TC expressed SI/HI - Denies Perceptions - Denies AVH; not RIS Judgement/Insight - Good Fund of knowledge - WNL Language - No impairments  Lab Results:  Admission on 10/18/2023  Component Date Value Ref Range Status   Vit D, 25-Hydroxy 10/19/2023 31.13  30 - 100 ng/mL Final    Vitals: Blood pressure 119/76, pulse 77, temperature 97.8 F (36.6 C), temperature source Oral, resp. rate 12, height 6' 2 (1.88 m), weight 77.6 kg, last menstrual period 09/22/2023, SpO2 100%.   Will Heinkel C  Kriss Perleberg, FNP  [name], female   DOB: [birth year], 48 y.o.   MRN: [id number]

## 2023-10-21 NOTE — Group Note (Signed)
 Recreation Therapy Group Note   Group Topic:Stress Management  Group Date: 10/21/2023 Start Time: 0930 End Time: 1000 Facilitators: Add Dinapoli-McCall, LRT,CTRS Location: 300 Hall Dayroom   Group Topic: Stress Management   Goal Area(s) Addresses:  Patient will actively participate in stress management techniques presented during session.  Patient will successfully identify benefit of practicing stress management post d/c.   Behavioral Response:   Intervention: Relaxation exercise with ambient sound and script   Activity: Guided Imagery. LRT provided education, instruction, and demonstration on practice of visualization via guided imagery. Patient was asked to participate in the technique introduced during session. LRT debriefed including topics of mindfulness, stress management and specific scenarios each patient could use these techniques. Patients were given suggestions of ways to access scripts post d/c and encouraged to explore Youtube and other apps available on smartphones, tablets, and computers.  Education:  Stress Management, Discharge Planning.   Education Outcome: Acknowledges education   Clinical Observations/Individualized Feedback: Group did not take place due to previous group taking recreation therapy group time.    Plan: Continue to engage patient in RT group sessions 2-3x/week.   Saleha Kalp-McCall, LRT,CTRS 10/21/2023 3:12 PM

## 2023-10-21 NOTE — Progress Notes (Addendum)
 Patient denies SI/HI/AVH this morning. Pt rates her depression a 2/10 and anxiety a 5/10. Pt reports that she slept fair last night. Pt complains of chronic back,neck, L knee, and shoulder pain and rates it a 7/10. Pt's goal is to go to groups today. Pt has been and calm and cooperative during interactions. Q 15 minute safety checks in place for safety.    10/21/23 0859  Psych Admission Type (Psych Patients Only)  Admission Status Voluntary  Psychosocial Assessment  Patient Complaints Depression;Anxiety  Eye Contact Fair  Facial Expression Flat  Affect Appropriate to circumstance  Speech Logical/coherent  Interaction Assertive  Motor Activity Other (Comment) (WDL)  Appearance/Hygiene Unremarkable  Behavior Characteristics Cooperative;Appropriate to situation  Mood Pleasant  Thought Process  Coherency WDL  Content WDL  Delusions None reported or observed  Perception WDL  Hallucination None reported or observed  Judgment Impaired  Confusion None  Danger to Self  Current suicidal ideation? Denies  Description of Suicide Plan No plan  Self-Injurious Behavior No self-injurious ideation or behavior indicators observed or expressed   Agreement Not to Harm Self Yes  Description of Agreement Pt verbally contracts for safety  Danger to Others  Danger to Others None reported or observed

## 2023-10-21 NOTE — Group Note (Signed)
 Date:  10/21/2023 Time:  10:30 AM  Group Topic/Focus:  Goals Group:   The focus of this group is to help patients establish daily goals to achieve during treatment and discuss how the patient can incorporate goal setting into their daily lives to aide in recovery.    Participation Level:  Active  Participation Quality:  Attentive  Affect:  Appropriate  Cognitive:  Alert  Insight: Good  Engagement in Group:  Engaged  Modes of Intervention:  Education  Additional Comments:    Heidi Holland 10/21/2023, 10:30 AM

## 2023-10-22 LAB — HIV ANTIBODY (ROUTINE TESTING W REFLEX): HIV Screen 4th Generation wRfx: NONREACTIVE

## 2023-10-22 LAB — RPR: RPR Ser Ql: NONREACTIVE

## 2023-10-22 NOTE — Progress Notes (Signed)
 Shift Note  (Sleep Hours) - 7  (Any PRNs that were needed, meds refused, or side effects to meds)- PO PRN Ambien  and Hydrocodone  given  (Any disturbances and when (visitation, over night)- None  (Concerns raised by the patient)- Pt had  family bring in her home medication of Mounjaro . Pt takes medication at bedtime and wants the provider to place the order for her home medications.   (SI/HI/AVH)- Denies

## 2023-10-22 NOTE — Progress Notes (Signed)
 Patient self-administered home medication, Mounjaro  to her left lower abdomen with RN supervision per NP written order.

## 2023-10-22 NOTE — Group Note (Signed)
 Date:  10/22/2023 Time:  10:55 AM  Group Topic/Focus:  Goals Group:   The focus of this group is to help patients establish daily goals to achieve during treatment and discuss how the patient can incorporate goal setting into their daily lives to aide in recovery.    Participation Level:  Active  Participation Quality:  Attentive  Affect:  Appropriate  Cognitive:  Alert  Insight: Good  Engagement in Group:  Engaged  Modes of Intervention:  Education  Additional Comments:    Heidi Holland 10/22/2023, 10:55 AM

## 2023-10-22 NOTE — Progress Notes (Addendum)
 Patient denies SI/HI/AVH this morning. Pt rates her depression a 1/10 and anxiety a 2/10. Pt reports that she slept fair last night. Pt continues to complain of 8/10 chronic back, hip, and knee pain, PRN hydrocodone  administered per [facility] for pain. Pt also complains of mild nausea today, PRN Zofran  administered per MAR. Pt has been interactive on the unit and participating in groups throughout the day. Pt has been cooperative throughout the day. Patient has been compliant with medications and treatment plan. Q 15 minute safety checks are in place for patient's safety. Patient is currently safe on the unit.   10/22/23 0921  Psych Admission Type (Psych Patients Only)  Admission Status Voluntary  Psychosocial Assessment  Patient Complaints Anxiety;Depression;Other (Comment) (Chronic pain)  Eye Contact Fair  Facial Expression Animated  Affect Appropriate to circumstance  Speech Logical/coherent  Interaction Assertive;Needy  Motor Activity Other (Comment) (WDL)  Appearance/Hygiene Unremarkable  Behavior Characteristics Cooperative;Appropriate to situation  Mood Depressed;Anxious;Pleasant  Thought Process  Coherency WDL  Content WDL  Delusions None reported or observed  Perception WDL  Hallucination None reported or observed  Judgment Impaired  Confusion None  Danger to Self  Current suicidal ideation? Denies  Description of Suicide Plan No plan  Self-Injurious Behavior No self-injurious ideation or behavior indicators observed or expressed   Agreement Not to Harm Self Yes  Description of Agreement Pt verbally contracts for safety  Danger to Others  Danger to Others None reported or observed

## 2023-10-22 NOTE — Group Note (Unsigned)
 Date:  10/22/2023 Time:  8:52 PM  Group Topic/Focus:  Wrap-Up Group:   The focus of this group is to help patients review their daily goal of treatment and discuss progress on daily workbooks.     Participation Level:  {BHH PARTICIPATION OZCZO:77735}  Participation Quality:  {BHH PARTICIPATION QUALITY:22265}  Affect:  {BHH AFFECT:22266}  Cognitive:  {BHH COGNITIVE:22267}  Insight: {BHH Insight2:20797}  Engagement in Group:  {BHH ENGAGEMENT IN HMNLE:77731}  Modes of Intervention:  {BHH MODES OF INTERVENTION:22269}  Additional Comments:  ***  Eward Mace 10/22/2023, 8:52 PM

## 2023-10-22 NOTE — Progress Notes (Signed)
 [facility] Inpatient Psychiatry Progress Note  Date: 10/22/23 [name] MRN: [id number]  Reason for admission: [name] is a 48 y/o female with a history of depression and anxiety admitted voluntarily to inpatient psychiatry for suicidal ideation in the context of multiple stressors, that include financial hardship and disability from chronic pain. While she reports a diagnosis of BPAD she does not clearly endorse clear symptoms consistent with a manic or hypomanic episode.   24-hour chart review: Patient case discussed in the interdisciplinary team meeting.  Vital signs reviewed without critical values.  As needed of Tylenol  x 1 for mild pain, hydrocodone  x 3 for moderate pain, Milk of Magnesia x 1 for constipation Robaxin  x 3 for muscle pain, trazodone  x 1 and Ambien  x 1 for insomnia.  No agitation protocol required.  Today's assessment notes:  On assessment today, the pt reports that her mood is euthymic, improved since admission, and stable. Denies feeling down, depressed, or sad. Reports that anxiety symptoms are at manageable level. Sleep is stable. Appetite is stable. Concentration is without complaint. Energy level is adequate. Denies having any suicidal thoughts. Denies having any suicidal intent and plan. Denies having any HI. Denies having psychotic symptoms. Denies having side effects to current psychiatric medications.   Reports interest in IOP program, LCSW to be made aware to follow-up on providing resources for patient. On overall, mood is stable and improving.  No changes in her treatment plan today, expected discharge on 10/23/23. Patient received her home medication of Mounjaro  injectables for her weight loss today.  Discussed discharge planning:  How to identify the signs of impending crisis, use of internal coping strategies, reaching out to friends and family that can help navigate a crisis, and a list of mental health professionals and  agencies to call. Further to follow up on her mental health appointments and her PCP appointments.   9/7 - Improvement since admission. Resolution of SI. Pain continues to be a chronic issue. She is interested in participating in an IOP program. Likely appropriate for discharge mid week.   # Mood Disorder, unspecified # Generalized Anxiety Disorder - Continue caplyta  42 mg daily - Quetiapine  400 mg at bedtime - Hold Auvelity. Wellbutrin  XL 150 mg daily - Clonazepam  0.5 mg BID prn    # Chronic pain - Hydrocodone  5-325 mg q6h prn -  Lyrica  to 200 mg BID  Risk Assessment - Low  Discharge Planning Estimated length of stay:  Predicted Discharge location: Home  Interval History and update: Chart reviewed. No significant events overnight. Patient has been med compliant. She reports not sleeping well last night due to knee and back pain. She reported feeling well today and that her mood was improved. She denied suicidal ideation. She is tolerating the higher dose of pregabalin  at 200 mg but reports it is mildly sedating. She reports that anxiety and distress is a bit improved.   Physical Exam MSK/Neuro - Normal gait and station Mental Status Exam Appearance - Casually dressed, appropriate hygiene Attitude - Casually dressed Speech - normal volume, prosody, inflection Mood - Okay Affect - Mildly restricted, appropriately reactive Thought Process - LLGD Thought Content - No delusional TC expressed SI/HI - Denies Perceptions - Denies AVH; not RIS Judgement/Insight - Good Fund of knowledge - WNL Language - No impairments  Lab Results:  Admission on 10/18/2023  Component Date Value Ref Range Status   Vit D, 25-Hydroxy 10/19/2023 31.13  30 - 100 ng/mL Final   Hgb  A1c MFr Bld 10/21/2023 5.3  4.8 - 5.6 % Final   Mean Plasma Glucose 10/21/2023 105.41  mg/dL Final   Cholesterol 90/91/7974 113  0 - 200 mg/dL Final   Triglycerides 90/91/7974 185 (H)  <150 mg/dL Final   HDL 90/91/7974 38  (L)  >40 mg/dL Final   Total CHOL/HDL Ratio 10/21/2023 3.0  RATIO Final   VLDL 10/21/2023 37  0 - 40 mg/dL Final   LDL Cholesterol 10/21/2023 38  0 - 99 mg/dL Final   HIV Screen 4th Generation wRfx 10/21/2023 Non Reactive  Non Reactive Final    Vitals: Blood pressure (!) 133/90, pulse 80, temperature 97.8 F (36.6 C), temperature source Oral, resp. rate 18, height 6' 2 (1.88 m), weight 77.6 kg, last menstrual period 09/22/2023, SpO2 100%.   Ellouise JAYSON Azure, FNP  [name], female   DOB: 1975/05/23, 48 y.o.   MRN: [id number] [name], female   DOB: [birth year], 48 y.o.   MRN: [id number]

## 2023-10-22 NOTE — Group Note (Signed)
 LCSW Group Therapy Note   Group Date: 10/22/2023 Start Time: 1100 End Time: 1200   Participation:  patient was present and actively participated in the discussion.  Type of Therapy:  Group Therapy   Topic:  Stronger Together:  Building Healthy Relationships  Objective:  To explore loneliness, boundaries, and safe ways to build relationships.  Goals: Recognize healthy vs. unhealthy relationships. Learn safe ways to connect with others. Strengthen communication and [employer].  Summary:  Participants discussed loneliness, healthy connections, and setting boundaries. They explored safe ways to meet people and shared personal experiences. Key insights were reinforced through discussion and quotes.  Therapeutic Modalities Used: Cognitive Behavioral Therapy (CBT) Elements - Identifying unhealthy relationship patterns, challenging negative thoughts about connection. Dialectical Behavior Therapy (DBT) Elements - Interpersonal effectiveness, setting and maintaining boundaries. Supportive Group Therapy - Peer discussion, shared experiences, and emotional validation.   Brielynn Sekula O Mckinna Demars, LCSWA 10/22/2023  4:42 PM

## 2023-10-22 NOTE — Plan of Care (Signed)
   Problem: Education: Goal: Knowledge of [facility] Education information/materials will improve Outcome: Progressing Goal: Emotional status will improve Outcome: Progressing Goal: Mental status will improve Outcome: Progressing Goal: Verbalization of understanding the information provided will improve Outcome: Progressing   Problem: Activity: Goal: Interest or engagement in activities will improve Outcome: Progressing

## 2023-10-22 NOTE — Group Note (Signed)
 Recreation Therapy Group Note   Group Topic:Animal Assisted Therapy   Group Date: 10/22/2023 Start Time: 0947 End Time: 1030 Facilitators: Timmie Dugue-McCall, LRT,CTRS Location: 300 Hall Dayroom   Animal-Assisted Activity (AAA) Program Checklist/Progress Notes Patient Eligibility Criteria Checklist & Daily Group note for Rec Tx Intervention  AAA/T Program Assumption of Risk Form signed by Patient/ or Parent Legal Guardian Yes  Patient is free of allergies or severe asthma Yes  Patient reports no fear of animals Yes  Patient reports no history of cruelty to animals Yes  Patient understands his/her participation is voluntary Yes  Patient washes hands before animal contact Yes  Patient washes hands after animal contact Yes  Behavioral Response: Engaged   Education: Charity fundraiser, Appropriate Animal Interaction   Education Outcome: Acknowledges education.    Affect/Mood: Appropriate   Participation Level: Engaged   Participation Quality: Independent   Behavior: Appropriate   Speech/Thought Process: Focused   Insight: Good   Judgement: Good   Modes of Intervention: Teaching laboratory technician   Patient Response to Interventions:  Engaged   Education Outcome:  In group clarification offered    Clinical Observations/Individualized Feedback: Patient attended session and interacted appropriately with therapy dog and peers. Patient asked appropriate questions about therapy dog and his training. Patient shared stories about their pets at home with group.    Plan: Continue to engage patient in RT group sessions 2-3x/week.   Lujean Ebright-McCall, LRT,CTRS 10/22/2023 12:06 PM

## 2023-10-23 ENCOUNTER — Other Ambulatory Visit: Payer: Self-pay | Admitting: Family Medicine

## 2023-10-23 ENCOUNTER — Ambulatory Visit (HOSPITAL_COMMUNITY): Admission: RE | Admit: 2023-10-23 | Source: Ambulatory Visit

## 2023-10-23 MED ORDER — CYANOCOBALAMIN 100 MCG PO TABS: 100.0000 ug | ORAL_TABLET | Freq: Every day | ORAL | 0 refills | Status: AC

## 2023-10-23 MED ORDER — PREGABALIN 200 MG PO CAPS: 200.0000 mg | ORAL_CAPSULE | Freq: Two times a day (BID) | ORAL | 0 refills | Status: AC

## 2023-10-23 MED ORDER — BUPROPION HCL ER (XL) 150 MG PO TB24: 150.0000 mg | ORAL_TABLET | Freq: Every day | ORAL | 0 refills | Status: DC

## 2023-10-23 NOTE — Progress Notes (Signed)
  [name] Adult Case Management Discharge Plan :  Will you be returning to the same living situation after discharge:  Yes,  pt returning home after discharge.  At discharge, do you have transportation home?: Yes,  pt will be transported home via taxi voucher.  Do you have the ability to pay for your medications: Yes,  pt is insured - [place] Medicaid/[place] [employer] of information consent forms completed and in the chart;  Patient's signature needed at discharge.  Patient to Follow up at:  Follow-up Information     [facility]. Schedule an appointment as soon as possible for a visit.   Why: You have an appointment for therapy services on   .  You also have an appointment for medication management services on Contact information: [address] [place], [place] [address] Phone: [phone]        [facility]. Schedule an appointment as soon as possible for a visit.   Specialty: Hospice and Palliative Medicine Why: Please call this provider personally to schedule an appointment for grief/bereavement therapy services. Contact information: [address] [place]  [phone]        [facility] Follow up on 10/28/2023.   Specialty: Behavioral Health Why: You are scheduled for an assessment for the Partial Hospitalization Program on Monday, 10/28/23 @ 10 am, Virtually. PHP is a group therapy that meets in-person, Mon-Thurs 9a-2p and Fri from 9am-1pm for approximately 3 weeks. This assessment appointment will be virtual via MyChart and last approximately 1 hour. If you need to cancel or reschedule, please call [phone]. Contact information: [address] [place]  [phone]                Next level of care provider has access to [facility] Link:no  Safety Planning and Suicide Prevention discussed: Yes,  completed with Heidi Holland, sister,  [phone]     Has patient been referred to the Quitline?: Patient does not use tobacco/nicotine products  Patient has been referred for addiction treatment: No known substance use disorder.  Heidi Holland M Heidi Holland, LCSWA 10/23/2023, 9:44 AM

## 2023-10-23 NOTE — Progress Notes (Signed)
 Patient is discharging at this time. Patient is A&Ox4. Stable. Patient denies SI,HI, and A/V/H with no plan/intent. Printed AVS reviewed with and given to patient along with medications and follow up appointments. Suicide safety plan complete along with survey. A copy was provided to patient. Original form in chart. Patient verbalized all understanding. All valuables/belongings returned to patient. Patient is being transported by taxi. Patient denies any pain/discomfort. No s/s of current distress.

## 2023-10-23 NOTE — Discharge Summary (Signed)
 Physician Discharge Summary Note  Patient:  Heidi Holland is a 48 y.o. female  MRN:  [id number]  DOB:  01-Jan-1976  Patient phone: [phone] (home)  Patient address:   [address] [place] [address]   Total Time spent with patient: 66 Minutes  Date of Admission:  10/18/2023  Date of Discharge: 10/23/23   Reason for Admission:  The patient is a 48 y/o female with a prior diagnosis of BPAD who was admitted for suicidal ideation with thoughts of overdosing in the context of multiple psychosocial stressors. She explains that she has been suffering a long string of multiple stressors for years that have been progressively taxing and recently have overwhelmed her. She states that while she has long experienced symptoms of depression and anxiety starting around 2009 she experienced multiple losses and subsequent hardships. She reports losing eight people close to her between 2009 and 2014, ending with her mother, who died in her home from lung cancer while on hospice. 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 She has been unable to pay her bills and is concerned that she will be facing eviction.    She endorses struggling with depression, anhedonia, hopelessness, fatigue, poor sleep, concentration difficulties, pain, generalized and pervasive worries that are difficult to control, panic attacks, irritability, and multiple somatic complaints. She reports that she was diagnosed with BPAD three years ago for symptoms of depression and a period of multiple days in a row wherein she was not sleeping and was highly anxious. She denies experiencing psychotic symptoms and does not endorse clear lack of need for sleep, grandiosity, or increased goal-directed activity. She rather describes  this period as highly anxious wherein she couldn't sleep and staid up all night engaging in activities such as cleaning.    In discussing her personal life, she explains that she is single and cares for her cats. While she has a few friends she describes little in the way of a support network and comments that she has historically been someone who prioritizes the health of others over herself.    She reports that two weeks ago her symptoms were at there most recent severe and resulted in a low-lethality overdose and creating a mild laceration on her left forearm. She reports that yesterday prior to presenting to [facility] she was again feeling despondent and overwhelmed and was having thoughts of overdosing again.   Principal Problem: Mood disorder [facility])  Discharge Diagnoses: Principal Problem:   Mood disorder (HCC) Active Problems:   Generalized anxiety disorder     Past Psychiatric (and medical) History: Heidi Holland  has a past medical history of Abnormal Pap smear of cervix, Allergy, Anxiety, Asthma, Bipolar 1 disorder (HCC), Chronic pain, Depression, Diabetes mellitus without complication (HCC), Diverticulitis, GERD (gastroesophageal reflux disease), HPV in female, Hyperlipidemia, Hypertension, Migraines, Osteoarthritis, Ovarian cyst, PCOS (polycystic ovarian syndrome), STD (sexually transmitted disease), Trichomonas contact, treated, and Vertigo.   Past Medical History:  Past Medical History:  Diagnosis Date   Abnormal Pap smear of cervix    Allergy    Anxiety    Asthma    Bipolar 1 disorder (HCC)    Chronic pain    Depression    Diabetes mellitus without complication (HCC)    Diverticulitis  GERD (gastroesophageal reflux disease)    HPV in female    Hyperlipidemia    Hypertension    Migraines    Osteoarthritis    Ovarian cyst    right   PCOS (polycystic ovarian syndrome)    STD (sexually transmitted disease)    Trichomonas contact, treated    Vertigo      Past Surgical  History:  Procedure Laterality Date   BOWEL RESECTION     gasticbypass     GASTRIC BYPASS  2016   KNEE ARTHROSCOPY Bilateral    SHOULDER SURGERY Left    TOTAL HIP ARTHROPLASTY Left      Family History:  Family History  Problem Relation Age of Onset   Stroke Mother    Heart disease Mother    Lung cancer Mother    Rheum arthritis Mother    Arthritis Mother    COPD Mother    Fibromyalgia Mother    Hypertension Father    Diabetes Father    Heart disease Father    Hodgkin's lymphoma Father    Endometriosis Sister    Drug abuse Brother        died of overdose   Breast cancer Paternal Aunt    Diabetes Maternal Grandmother    Lung cancer Maternal Grandmother    Other Maternal Grandmother        Visual merchandiser   Skin cancer Paternal Grandmother      Family Psychiatric  History: See history and physical  Social History:  Social History   Substance and Sexual Activity  Alcohol Use Not Currently     Social History   Substance and Sexual Activity  Drug Use Yes   Types: Marijuana   Comment: occ     Social History   Socioeconomic History   Marital status: Single    Spouse name: Not on file   Number of children: 0   Years of education: 14   Highest education level: Associate degree: academic program  Occupational History    Employer: [employer]  Tobacco Use   Smoking status: Never   Smokeless tobacco: Never  Vaping Use   Vaping status: Never Used  Substance and Sexual Activity   Alcohol use: Not Currently   Drug use: Yes    Types: Marijuana    Comment: occ   Sexual activity: Not Currently    Partners: Male    Birth control/protection: Abstinence  Other Topics Concern   Not on file  Social History Narrative   Right handed.    Social Drivers of Health   Financial Resource Strain: High Risk (06/27/2023)   Overall Financial Resource Strain (CARDIA)    Difficulty of Paying Living Expenses: Very hard  Food Insecurity: Food Insecurity Present (10/18/2023)   Hunger  Vital Sign    Worried About Running Out of Food in the Last Year: Often true    Ran Out of Food in the Last Year: Often true  Transportation Needs: Unmet Transportation Needs (10/18/2023)   PRAPARE - Administrator, Civil Service (Medical): Yes    Lack of Transportation (Non-Medical): Yes  Physical Activity: Unknown (06/27/2023)   Exercise Vital Sign    Days of Exercise per Week: 0 days    Minutes of Exercise per Session: Not on file  Stress: Stress Concern Present (06/27/2023)   [employer] of Occupational Health - Occupational Stress Questionnaire    Feeling of Stress : Rather much  Social Connections: Socially Isolated (06/27/2023)   Social Connection and Isolation  Panel    Frequency of Communication with Friends and Family: Three times a week    Frequency of Social Gatherings with Friends and Family: Once a week    Attends Religious Services: Never    Database administrator or Organizations: No    Attends Engineer, structural: Not on file    Marital Status: Never married     Hospital Course:  During the patient's hospitalization, patient had extensive initial psychiatric evaluation, and follow-up psychiatric evaluations every day.  Psychiatric diagnoses provided upon initial assessment: Bipolar 1 disorder, mixed, severe (HCC) [F31.63]   The patient was essentially continued on home medications.  Pregabalin  was increased to 200 mg twice daily to help with fibromyalgia pain syndrome.  Wellbutrin  was prescribed at 150 mg daily.  Patient's care was discussed during the interdisciplinary team meeting every day during the hospitalization.  The patient denied having side effects to prescribed psychiatric medication.  Gradually, patient started adjusting to milieu. The patient was evaluated each day by a clinical provider to ascertain response to treatment. Improvement was noted by the patient's report of decreasing symptoms, improved sleep and appetite, affect,  medication tolerance, behavior, and participation in unit programming.  Patient was asked each day to complete a self inventory noting mood, mental status, pain, new symptoms, anxiety and concerns.    Symptoms were reported as significantly decreased or resolved completely by discharge.   On day of discharge, the patient reports that their mood is stable. The patient denied having suicidal thoughts for more than 48 hours prior to discharge.  Patient denies having homicidal thoughts.  Patient denies having auditory hallucinations.  Patient denies any visual hallucinations or other symptoms of psychosis. The patient was motivated to continue taking medication with a goal of continued improvement in mental health.   The patient reports their target psychiatric symptoms of depression, anxiety, and suicidal ideation responded well to the psychiatric medications and unit programming.  The patient reports overall benefit other psychiatric hospitalization. Supportive psychotherapy was provided to the patient. The patient also participated in regular group therapy while hospitalized. Coping skills, problem solving as well as relaxation therapies were also part of the unit programming.  Labs were reviewed with the patient, and abnormal results were discussed with the patient.  The patient is able to verbalize their individual safety plan to this provider.    Physical Findings:  AIMS:  Facial and Oral Movements: None Muscles of Facial Expression: None Lips and Perioral Area: None Jaw: None Tongue: None,Extremity Movements Upper (arms, wrists, hands, fingers): None Lower (legs, knees, ankles, toes): None, Trunk Movements Neck, shoulders, hips: None, Global Judgements Severity of abnormal movements overall: None Incapacitation due to abnormal movements: None Patient's awareness of abnormal movements: No Awareness, Dental Status Current problems with teeth and/or dentures: No Does patient usually wear  dentures: No Edentia: No   CIWA:   NA  COWS:  NA  Musculoskeletal: Strength & Muscle Tone: within normal limits Gait & Station: normal Patient leans: N/A    Psychiatric Specialty Exam:  Presentation  General Appearance: Appropriate for Environment  Eye Contact: Good  Speech: Clear and Coherent; Normal Rate  Speech Volume: Normal  Handedness: Right   Mood and Affect  Mood: Euthymic  Affect: Congruent   Thought Process  Thought Processes: Linear  Descriptions of Associations: Intact  Orientation: Full (Time, Place and Person)  Thought Content: Logical  History of Schizophrenia/Schizoaffective disorder: No  Duration of Psychotic Symptoms: NA Hallucinations: Hallucinations: None  Ideas of  Reference: None  Suicidal Thoughts: Suicidal Thoughts: No SI Active Intent and/or Plan: -- (Denies)  Homicidal Thoughts: Homicidal Thoughts: No   Sensorium  Memory: Immediate Good  Judgment: Fair  Insight: Fair   Art therapist  Concentration: Good  Attention Span: Good  Recall: Good  Fund of Knowledge: Good  Language: Good   Psychomotor Activity  Psychomotor Activity: Psychomotor Activity: Normal   Assets  Assets: Communication Skills; Social Support; Housing; Leisure Time   Sleep  Sleep: Sleep: Good Number of Hours of Sleep: 7      Physical Exam: General: Sitting comfortably. NAD. HEENT: Normocephalic, atraumatic, MMM, EMOI Lungs: no increased work of breathing noted Heart: no cyanosis Abdomen: Non distended Musculoskeletal: FROM. No obvious deformities Skin: Warm, dry, intact. No rashes noted Neuro: No obvious focal deficits.  Gait and station are normal  Review of Systems:  Constitutional: Negative.   HENT: Negative.    Eyes: Negative.   Respiratory: Negative.    Cardiovascular: Negative.   Gastrointestinal: Negative.   Genitourinary: Negative.   Skin: Negative.   Neurological: Negative.   Psychiatric/Behavioral:   Negative  Blood pressure 137/84, pulse 87, temperature 97.9 F (36.6 C), temperature source Oral, resp. rate 16, height 6' 2 (1.88 m), weight 77.6 kg, last menstrual period 09/22/2023, SpO2 100%. Body mass index is 21.96 kg/m.    Social History   Tobacco Use  Smoking Status Never  Smokeless Tobacco Never     Tobacco Cessation:  A prescription for an FDA approved medication for tobacco cessation was not prescribed because: Patient Refused   Blood Alcohol level:  Lab Results  Component Value Date   [facility] <15 10/17/2023    Metabolic Disorder Labs:  Lab Results  Component Value Date   HGBA1C 5.3 10/21/2023   MPG 105.41 10/21/2023   MPG 163 12/15/2019   No results found for: PROLACTIN  Lab Results  Component Value Date   CHOL 113 10/21/2023   TRIG 185 (H) 10/21/2023   HDL 38 (L) 10/21/2023   VLDL 37 10/21/2023   LDLCALC 38 10/21/2023   LDLCALC 69 06/27/2023      See Psychiatric Specialty Exam and Suicide Risk Assessment completed by Attending Physician prior to discharge.  Discharge destination: Home  Is patient on multiple antipsychotic therapies at discharge:  No  Has Patient had three or more failed trials of antipsychotic monotherapy by history: NA Recommended Plan for Multiple Antipsychotic Therapies: NA   Discharge Instructions     Diet - low sodium heart healthy   Complete by: As directed    Increase activity slowly   Complete by: As directed         Allergies as of 10/23/2023       Reactions   Ciprofloxacin Itching   Wound Dressing Adhesive Other (See Comments)   Red skin, eats into skin, becomes bubbly        Medication List     TAKE these medications      Indication  buPROPion  150 MG 24 hr tablet Commonly known as: WELLBUTRIN  XL Take 1 tablet (150 mg total) by mouth daily for 14 days. Start taking on: October 24, 2023  Indication: Major Depressive Disorder   cyanocobalamin  100 MCG tablet Take 1 tablet (100 mcg total) by  mouth daily for 14 days. Start taking on: October 24, 2023  Indication: Inadequate Vitamin B12   pregabalin  200 MG capsule Commonly known as: LYRICA  Take 1 capsule (200 mg total) by mouth 2 (two) times daily for 7 days.  Indication: Fibromyalgia Syndrome          Follow-up Information     [facility]. Schedule an appointment as soon as possible for a visit.   Why: You have an appointment for therapy services on   .  You also have an appointment for medication management services on Contact information: [address] [place], [place] [address] Phone: [phone]        [facility]. Schedule an appointment as soon as possible for a visit.   Specialty: Hospice and Palliative Medicine Why: Please call this provider personally to schedule an appointment for grief/bereavement therapy services. Contact information: [address] [place]  [address] [phone]        [facility] Follow up on 10/28/2023.   Specialty: Behavioral Health Why: You are scheduled for an assessment for the Partial Hospitalization Program on Monday, 10/28/23 @ 10 am, Virtually. PHP is a group therapy that meets in-person, Mon-Thurs 9a-2p and Fri from 9am-1pm for approximately 3 weeks. This assessment appointment will be virtual via MyChart and last approximately 1 hour. If you need to cancel or reschedule, please call [phone]. Contact information: [address] [place]  [phone]                   Follow-up recommendations:  - It is recommended to the patient to continue psychiatric medications as prescribed, after discharge from the hospital.   - It is recommended to the patient to follow up with your outpatient psychiatric provider and PCP. - It was discussed with the patient, the impact of alcohol, drugs, tobacco have been there overall psychiatric and medical wellbeing, and total abstinence from  substance use was recommended the patient. - Prescriptions provided or sent directly to preferred pharmacy at discharge. Patient agreeable to plan. Given opportunity to ask questions. Appears to feel comfortable with discharge.   - In the event of worsening symptoms, the patient is instructed to call the crisis hotline, 911 and or go to the nearest ED for appropriate evaluation and treatment of symptoms. To follow-up with primary care provider for other medical issues, concerns and or health care needs - Patient was discharged home with a plan to follow up as noted above.   Comments:  NA  Signed: Glendia Kitty, MD 10/23/23 12:41 PM

## 2023-10-23 NOTE — Telephone Encounter (Addendum)
 RN called Heidi Holland to advise her of PAP result and recommendation of scheduling colposcopy along with skin tag removal and ultrasound follow up per Dr. Zina.  Heidi Holland vrebalized understanding of results.  Heidi Holland states she is very anxious about procedure and would really prefer a Monday so her support person can be present.  RN informed front office staff of Heidi Holland scheduling concern.  RN scheduled Pelvic US  at [facility] 11/06/23 at 11:00am, Heidi Holland notified of appointment by MyChart message per her request.   Waddell, RN  ----- Message from Jerilynn DELENA Zina sent at 10/22/2023  5:26 PM EDT ----- Abnormal pap with CIN and high risk HPV, schedule colposcopy ----- Message ----- From: Interface, Lab In [facility] Sent: 10/17/2023   5:08 PM EDT To: Jerilynn DELENA Zina, MD

## 2023-10-23 NOTE — [facility]HH Suicide Risk Assessment (Signed)
 [facility] Discharge Suicide Risk Assessment   Principal Problem: Mood disorder [facility])  Discharge Diagnoses: Principal Problem:   Mood disorder (HCC) Active Problems:   Generalized anxiety disorder      Total Time spent with patient: 40  Musculoskeletal: Strength & Muscle Tone: within normal limits Gait & Station: normal Patient leans: N/A   Psychiatric Specialty Exam:  Presentation  General Appearance: Appropriate for Environment  Eye Contact: Good  Speech: Clear and Coherent; Normal Rate  Speech Volume: Normal  Handedness: Right   Mood and Affect  Mood: Euthymic  Affect: Congruent   Thought Process  Thought Processes: Linear  Descriptions of Associations: Intact  Orientation: Full (Time, Place and Person)  Thought Content: Logical  History of Schizophrenia/Schizoaffective disorder: No  Duration of Psychotic Symptoms: NA Hallucinations: Hallucinations: None  Ideas of Reference: None  Suicidal Thoughts: Suicidal Thoughts: No SI Active Intent and/or Plan: -- (Denies)  Homicidal Thoughts: Homicidal Thoughts: No   Sensorium  Memory: Immediate Good  Judgment: Fair  Insight: Fair   Art therapist  Concentration: Good  Attention Span: Good  Recall: Good  Fund of Knowledge: Good  Language: Good   Psychomotor Activity  Psychomotor Activity: Psychomotor Activity: Normal   Assets  Assets: Communication Skills; Social Support; Housing; Leisure Time   Sleep  Sleep: Sleep: Good Number of Hours of Sleep: 7   Physical Exam: General: Sitting comfortably. NAD. HEENT: Normocephalic, atraumatic, MMM, EMOI Lungs: no increased work of breathing noted Heart: no cyanosis Abdomen: Non distended Musculoskeletal: FROM. No obvious deformities Skin: Warm, dry, intact. No rashes noted Neuro: No obvious focal deficits.  Gait and station are normal  Review of Systems  Constitutional: Negative.   HENT: Negative.    Eyes: Negative.    Respiratory: Negative.    Cardiovascular: Negative.   Gastrointestinal: Negative.   Genitourinary: Negative.   Skin: Negative.   Neurological: Negative.   Psychiatric/Behavioral:  Negative   Mental Status Per Nursing Assessment: Self-harm thoughts  Demographic Factors:  Caucasian  Loss Factors: NA  Historical Factors: Prior suicide attempts  Risk Reduction Factors:   Sense of responsibility to family, Positive social support, Positive therapeutic relationship, and Positive coping skills or problem solving skills  Continued Clinical Symptoms:  Previous psychiatric diagnoses and treatments  Cognitive Features That Contribute To Risk:  None  Suicide Risk:  Minimal: No identifiable suicidal ideation.  Patients presenting with no risk factors but with morbid ruminations; may be classified as minimal risk based on the severity of the depressive symptoms.    Follow-up Information     [facility]. Schedule an appointment as soon as possible for a visit.   Why: You have an appointment for therapy services on   .  You also have an appointment for medication management services on Contact information: [address] [place], [place] [address] Phone: [phone]        [facility]. Schedule an appointment as soon as possible for a visit.   Specialty: Hospice and Palliative Medicine Why: Please call this provider personally to schedule an appointment for grief/bereavement therapy services. Contact information: [address] [place]  [address] [phone]        [facility] Follow up on 10/28/2023.   Specialty: Behavioral Health Why: You are scheduled for an assessment for the Partial Hospitalization Program on Monday, 10/28/23 @ 10 am, Virtually. PHP is a group therapy that meets in-person, Mon-Thurs 9a-2p and Fri from 9am-1pm for approximately 3 weeks. This assessment appointment  will be virtual via MyChart  and last approximately 1 hour. If you need to cancel or reschedule, please call [phone]. Contact information: [address] [place]  [address] [phone]                 Plan Of Care/Follow-up recommendations:  Activity: as tolerated  Diet: heart healthy  Other: -Follow-up with your outpatient psychiatric provider -instructions on appointment date, time, and address (location) are provided to you in discharge paperwork.  -Take your psychiatric medications as prescribed at discharge - instructions are provided to you in the discharge paperwork  -Follow-up with outpatient primary care doctor and other specialists -for management of preventative medicine and chronic medical issues  -Testing: Follow-up with outpatient provider for any abnormal lab results (if any)  -If you are prescribed an atypical antipsychotic medication, we recommend that your outpatient psychiatrist follow routine screening for side effects within 3 months of discharge, including monitoring: AIMS scale, height, weight, blood pressure, fasting lipid panel, HbA1c, and fasting blood sugar.   -Recommend total abstinence from alcohol, tobacco, and other illicit drug use at discharge.   -If your psychiatric symptoms recur, worsen, or if you have side effects to your psychiatric medications, call your outpatient psychiatric provider, 911, [phone] or go to the nearest emergency department.  -If suicidal thoughts occur, immediately call your outpatient psychiatric provider, 911, [phone] or go to the nearest emergency department.   Glendia Kitty, MD 10/23/23 11:36 AM

## 2023-10-23 NOTE — Transportation (Signed)
 10/23/2023  Heidi Holland DOB: [birth year] MRN: [id number]   RIDER WAIVER AND RELEASE OF LIABILITY  For the purposes of helping with transportation needs, [facility] partners with outside transportation providers (taxi companies, [place], Catering manager.) to give [facility] patients or other approved people the choice of on-demand rides Public librarian) to our buildings for non-emergency visits.  By using [employer], I, the person signing this document, on behalf of myself and/or any legal minors (in my care using the [employer]), agree:  Science writer given to me are supplied by independent, outside transportation providers who do not work for, or have any affiliation with, [employer]. [facility] is not a transportation company. [facility] has no control over the quality or safety of the rides I get using [employer]. [facility] has no control over whether any outside ride will happen on time or not. [facility] gives no guarantee on the reliability, quality, safety, or availability on any rides, or that no mistakes will happen. I know and accept that traveling by vehicle (car, truck, SVU, fleeta, bus, taxi, etc.) has risks of serious injuries such as disability, being paralyzed, and death. I know and agree the risk of using [employer] is mine alone, and not [employer]. [employer] are provided as is and as are available. The transportation providers are in charge for all inspections and care of the vehicles used to provide these rides. I agree not to take legal action against [employer], its agents, employees, officers, directors, representatives, insurers, attorneys, assigns, successors, subsidiaries, and affiliates at any time for any reasons related directly or indirectly to using [employer]. I also agree not to take legal action against  or its affiliates for any injury, death, or damage to property caused by or related to using  [employer]. I have read this Waiver and Release of Liability, and I understand the terms used in it and their legal meaning. This Waiver is freely and voluntarily given with the understanding that my right (or any legal minors) to legal action against  relating to [employer] is knowingly given up to use these services.   I attest that I read the Ride Waiver and Release of Liability to Heidi Holland, gave Ms. Fronek the opportunity to ask questions and answered the questions asked (if any). I affirm that Larrisa Cravey then provided consent for assistance with transportation.       Keari Miu, LCSWA 10/23/23 9:40am

## 2023-10-23 NOTE — Group Note (Signed)
 Date:  10/23/2023 Time:  9:08 AM  Group Topic/Focus:  Goals Group:   The focus of this group is to help patients establish daily goals to achieve during treatment and discuss how the patient can incorporate goal setting into their daily lives to aide in recovery. Orientation:   The focus of this group is to educate the patient on the purpose and policies of crisis stabilization and provide a format to answer questions about their admission.  The group details unit policies and expectations of patients while admitted.    Participation Level:  Active  Participation Quality:  Appropriate  Affect:  Appropriate  Cognitive:  Appropriate  Insight: Appropriate  Engagement in Group:  Engaged  Modes of Intervention:  Discussion  Additional Comments:  Pt goal Go home  Adriana GORMAN Blush 10/23/2023, 9:08 AM

## 2023-10-23 NOTE — Group Note (Signed)
 Recreation Therapy Group Note   Group Topic:Communication  Group Date: 10/23/2023 Start Time: 0930 End Time: 1005 Facilitators: Kalan Yeley-McCall, LRT,CTRS Location: [address] Dayroom   Group Topic: Communication, Problem Solving   Goal Area(s) Addresses:  Patient will effectively listen to complete activity.  Patient will identify communication skills used to make activity successful.  Patient will identify how skills used during activity can be used to reach post d/c goals.    Behavioral Response: Engaged  Intervention: Building surveyor Activity - Geometric pattern cards, pencils, blank paper    Activity: Geometric Drawings.  Three volunteers from the peer group will be shown an abstract picture with a particular arrangement of geometrical shapes.  Each round, one 'speaker' will describe the pattern, as accurately as possible without revealing the image to the group.  The remaining group members will listen and draw the picture to reflect how it is described to them. Patients with the role of 'listener' cannot ask clarifying questions but, may request that the speaker repeat a direction. Once the drawings are complete, the presenter will show the rest of the group the picture and compare how close each person came to drawing the picture. LRT will facilitate a post-activity discussion regarding effective communication and the importance of planning, listening, and asking for clarification in daily interactions with others.  Education: Environmental consultant, Active listening, Support systems, Discharge planning  Education Outcome: Acknowledges understanding/In group clarification offered/Needs additional education.    Affect/Mood: Appropriate   Participation Level: Engaged   Participation Quality: Independent   Behavior: Appropriate   Speech/Thought Process: Focused   Insight: Good   Judgement: Good   Modes of Intervention: Activity   Patient Response to  Interventions:  Engaged   Education Outcome:  In group clarification offered    Clinical Observations/Individualized Feedback: Pt was focused and attentive during activity. Pt had moments were she felt she was off with her drawings but when the pictures were revealed, pt drawings were a lot closer to the original than she thought. Pt one of the things she learned from the activity is you have to be detailed.     Plan: Continue to engage patient in RT group sessions 2-3x/week.   Kelson Queenan-McCall, LRT,CTRS 10/23/2023 11:16 AM

## 2023-10-24 ENCOUNTER — Encounter: Payer: Self-pay | Admitting: Family Medicine

## 2023-10-24 NOTE — Telephone Encounter (Signed)
 Please advise, no Rx on current medication list

## 2023-10-25 ENCOUNTER — Other Ambulatory Visit: Payer: Self-pay | Admitting: Medical Genetics

## 2023-10-25 ENCOUNTER — Other Ambulatory Visit: Payer: Self-pay

## 2023-10-25 NOTE — Telephone Encounter (Signed)
 Please clarify what dose she is on.  Last prescribed dose we gave her was 10 mg weekly.  If this is accurate then it is okay to increase to 12.5 mg weekly.  Recommend she schedule follow-up appoint with us  soon to check her A1c.

## 2023-10-25 NOTE — Progress Notes (Signed)
 Spiritual care group on grief and loss facilitated by Chaplain Rockie Sofia, [facility]  Group Goal: Support / Education around grief and loss  Members engage in facilitated group support and psycho-social education.  Group Description:  Following introductions and group rules, group members engaged in facilitated group dialogue and support around topic of loss, with particular support around experiences of loss in their lives. Group Identified types of loss (relationships / self / things) and identified patterns, circumstances, and changes that precipitate losses. Reflected on thoughts / feelings around loss, normalized grief responses, and recognized variety in grief experience. Group encouraged individual reflection on safe space and on the coping skills that they are already utilizing.  Group drew on Adlerian / Rogerian and narrative framework  Patient Progress: Heidi Holland attended group and actively engaged and participated in group conversation and activities.  She shared about the losses of several family members and received support from the group.

## 2023-10-25 NOTE — Progress Notes (Signed)
 Spiritual care group facilitated by Chaplain Rockie Sofia, [facility]   Group focused on topic of strength. Group members reflected on what thoughts and feelings emerge when they hear this topic. They then engaged in facilitated dialog around how strength is present in their lives. This dialog focused on representing what strength had been to them in their lives (images and patterns given) and what they saw as helpful in their life now (what they needed / wanted).  Activity drew on narrative framework.  Patient Progress: Heidi Holland attended group and actively engaged and participated in group conversation and activities.

## 2023-10-28 ENCOUNTER — Other Ambulatory Visit: Payer: Self-pay | Admitting: *Deleted

## 2023-10-28 ENCOUNTER — Ambulatory Visit
Admission: RE | Admit: 2023-10-28 | Discharge: 2023-10-28 | Disposition: A | Discharge status: Home / Self Care | Source: Ambulatory Visit | Attending: Obstetrics and Gynecology

## 2023-10-28 DIAGNOSIS — Z01419 Encounter for gynecological examination (general) (routine) without abnormal findings: Secondary | ICD-10-CM

## 2023-10-28 DIAGNOSIS — Z1231 Encounter for screening mammogram for malignant neoplasm of breast: Secondary | ICD-10-CM | POA: Diagnosis not present

## 2023-10-28 MED ORDER — LOSARTAN POTASSIUM 50 MG PO TABS: 50.0000 mg | ORAL_TABLET | Freq: Every day | ORAL | 1 refills | Status: AC

## 2023-10-28 MED ORDER — TIRZEPATIDE 12.5 MG/0.5ML ~~LOC~~ SOAJ: 12.5000 mg | SUBCUTANEOUS | 0 refills | Status: DC

## 2023-10-28 NOTE — Telephone Encounter (Signed)
 Rx send to [facility] pharmacy

## 2023-10-28 NOTE — Telephone Encounter (Signed)
 Spoke with patient stated been taking 10Mg  Mounjaro  for about 6 weeks  Need to increase dose  Requesting Rx Losartan , not in current medication list  Please advise

## 2023-10-28 NOTE — Telephone Encounter (Signed)
 Please send in Mounjaro  12.5 mg weekly and clarify what dose of losartan  she is on.   Worth HERO. Kennyth, MD 10/28/2023 11:45 AM

## 2023-10-29 ENCOUNTER — Ambulatory Visit: Admitting: Family Medicine

## 2023-10-29 NOTE — Progress Notes (Deleted)
   LILLETTE Ileana Collet, PhD, LAT, ATC acting as a scribe for Artist Lloyd, MD.  Heidi Holland is a 48 y.o. female who presents to [facility] today for R thumb pain, polyarthralgia, and fibromyalgia. Pt was last seen by Dr. Lloyd on 08/19/23 and was give a R 1st [facility] steroid injection. Pt also given a Toradol  IM injection and prednisone  Dosepak.   Today, pt reports ***  Dx testing: 08/19/23 R hand XR 06/27/23 Labs  Pertinent review of systems: ***  Relevant historical information: ***   Exam:  LMP 09/22/2023 (Exact Date)  General: Well Developed, well nourished, and in no acute distress.   MSK: ***    Lab and Radiology Results No results found for this or any previous visit (from the past 72 hours). No results found.     Assessment and Plan: 47 y.o. female with ***   PDMP not reviewed this encounter. No orders of the defined types were placed in this encounter.  No orders of the defined types were placed in this encounter.    Discussed warning signs or symptoms. Please see discharge instructions. Patient expresses understanding.   ***

## 2023-10-30 ENCOUNTER — Ambulatory Visit (HOSPITAL_COMMUNITY): Admitting: Professional

## 2023-10-30 DIAGNOSIS — F4310 Post-traumatic stress disorder, unspecified: Secondary | ICD-10-CM | POA: Diagnosis not present

## 2023-10-30 DIAGNOSIS — F319 Bipolar disorder, unspecified: Secondary | ICD-10-CM | POA: Diagnosis not present
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
## 2023-10-31 ENCOUNTER — Other Ambulatory Visit: Payer: Self-pay | Admitting: Obstetrics and Gynecology

## 2023-10-31 DIAGNOSIS — G894 Chronic pain syndrome: Secondary | ICD-10-CM | POA: Diagnosis not present

## 2023-10-31 DIAGNOSIS — R928 Other abnormal and inconclusive findings on diagnostic imaging of breast: Secondary | ICD-10-CM

## 2023-10-31 DIAGNOSIS — Z5181 Encounter for therapeutic drug level monitoring: Secondary | ICD-10-CM | POA: Diagnosis not present

## 2023-10-31 DIAGNOSIS — F411 Generalized anxiety disorder: Secondary | ICD-10-CM | POA: Diagnosis not present

## 2023-10-31 DIAGNOSIS — M5416 Radiculopathy, lumbar region: Secondary | ICD-10-CM | POA: Diagnosis not present

## 2023-11-01 DIAGNOSIS — F410 Panic disorder [episodic paroxysmal anxiety] without agoraphobia: Secondary | ICD-10-CM | POA: Diagnosis not present

## 2023-11-01 DIAGNOSIS — F4310 Post-traumatic stress disorder, unspecified: Secondary | ICD-10-CM | POA: Diagnosis not present

## 2023-11-01 DIAGNOSIS — F3132 Bipolar disorder, current episode depressed, moderate: Secondary | ICD-10-CM | POA: Diagnosis not present

## 2023-11-01 DIAGNOSIS — F411 Generalized anxiety disorder: Secondary | ICD-10-CM | POA: Diagnosis not present

## 2023-11-01 DIAGNOSIS — F5102 Adjustment insomnia: Secondary | ICD-10-CM | POA: Diagnosis not present

## 2023-11-04 ENCOUNTER — Encounter: Payer: Self-pay | Admitting: Physical Therapy

## 2023-11-04 ENCOUNTER — Ambulatory Visit: Payer: Self-pay | Attending: Neurology | Admitting: Physical Therapy

## 2023-11-04 DIAGNOSIS — M5459 Other low back pain: Secondary | ICD-10-CM | POA: Diagnosis present

## 2023-11-04 DIAGNOSIS — M25552 Pain in left hip: Secondary | ICD-10-CM | POA: Diagnosis present

## 2023-11-04 DIAGNOSIS — H8112 Benign paroxysmal vertigo, left ear: Secondary | ICD-10-CM | POA: Diagnosis present

## 2023-11-04 DIAGNOSIS — M546 Pain in thoracic spine: Secondary | ICD-10-CM | POA: Insufficient documentation

## 2023-11-04 NOTE — Therapy (Signed)
 OUTPATIENT PHYSICAL THERAPY/NEURO TREATMENT NOTE/RE-EVALUATION/RE-CERT   Patient Name: Heidi Holland MRN: [id number] DOB:1975/08/05, 48 y.o., female Today's Date: 11/04/2023  END OF SESSION:  PT End of Session - 11/04/23 0950     Visit Number 6    Number of Visits 17   eval + 8 land visits, 8 aquatic visits   Date for Recertification  12/13/23    Authorization Type [employer] - no auth required    Authorization - Visit Number 6    Authorization - Number of Visits 27    PT Start Time 0850    PT Stop Time 0935    PT Time Calculation (min) 45 min    Equipment Utilized During Treatment --    Activity Tolerance Patient tolerated treatment well    Behavior During Therapy WFL for tasks assessed/performed           Past Medical History:  Diagnosis Date   Abnormal Pap smear of cervix    Allergy    Anxiety    Asthma    Bipolar 1 disorder (HCC)    Chronic pain    Depression    Diabetes mellitus without complication (HCC)    Diverticulitis    GERD (gastroesophageal reflux disease)    HPV in female    Hyperlipidemia    Hypertension    Migraines    Osteoarthritis    Ovarian cyst    right   PCOS (polycystic ovarian syndrome)    STD (sexually transmitted disease)    Trichomonas contact, treated    Vertigo    Past Surgical History:  Procedure Laterality Date   BOWEL RESECTION     gasticbypass     GASTRIC BYPASS  2016   KNEE ARTHROSCOPY Bilateral    SHOULDER SURGERY Left    TOTAL HIP ARTHROPLASTY Left    Patient Active Problem List   Diagnosis Date Noted   PTSD (post-traumatic stress disorder) 10/30/2023   Generalized anxiety disorder 10/19/2023   Mood disorder 10/19/2023   Bipolar 1 disorder, mixed, severe (HCC) 10/18/2023   Meralgia paresthetica of left side 07/15/2023   Fibromyalgia 06/27/2023   Ovarian cyst 02/20/2023   Essential hypertension 02/20/2023   Bipolar 1 disorder, depressed (HCC) 02/16/2022   Paresthesia 09/13/2021   CIN I (cervical  intraepithelial neoplasia I) 03/03/2021   LSIL pap smear of cervix/human papillomavirus (HPV) positive 10/24/2020   Family history of early CAD 10/24/2020   B12 deficiency 07/26/2020   Somatic dysfunction of spine, cervical 07/21/2020   Status post left hip replacement 03/12/2019   T2DM (type 2 diabetes mellitus) (HCC) 07/31/2018   Dyslipidemia associated with type 2 diabetes mellitus (HCC) 07/31/2018   Allergic rhinitis 07/31/2018   Mild intermittent asthma, uncomplicated 07/31/2018   GERD (gastroesophageal reflux disease) 07/31/2018   S/P gastric bypass 07/31/2018   Migraine 07/31/2018   Anxiety 07/31/2018   Depression, major, single episode, complete remission 07/31/2018   Insomnia 07/31/2018   Umbilical hernia 07/31/2018   Iron deficiency anemia 06/09/2018   Cervicogenic headache 05/12/2018   Low back pain 05/12/2018   Polyarthralgia 05/12/2018    PCP: Heidi Bart, MD  REFERRING PROVIDER: Joane Artist RAMAN, MD; Heidi Juliene SAUNDERS, DO  REFERRING DIAG:  Diagnosis  M25.50 (ICD-10-CM) - Polyarthralgia  M79.7 (ICD-10-CM) - Fibromyalgia  R53.1 (ICD-10-CM) - Weakness   H81.13 (ICD-10-CM) - Benign paroxysmal positional vertigo due to bilateral vestibular disorder  Rationale for Evaluation and Treatment: Rehabilitation  THERAPY DIAG:  Other low back pain  Pain in thoracic spine  Pain in  left hip  BPPV (benign paroxysmal positional vertigo), left  ONSET DATE: early 2024 for onset of chronic pain: Referral date 07-15-23; NEW REFERRAL 10-17-23 from Heidi Holland  SUBJECTIVE:                                                                                                                                                                                           SUBJECTIVE STATEMENT:  Pt returns to PT since previous aquatic session on 09-18-23 with previous land session on 09-12-23; pt had Covid in August and had to cancel appts, then had behavioral health hospitalization in Sept. With D/C on  10-23-23; pt returns to PT - wants to resume land and aquatic PT to address back pain; hasn't done any exercises since discharge from hospital.  Pt requests to check for BPPV - says  she had some dizziness when she was sitting on the floor last night and fell over onto her side; has MRI scheduled on 12-05-23 (with anesthesia)   From previous OP PT admission -- Pt reports Lt leg feels weaker than RLE; says RLE is starting to get weaker based on compensation for LLE weakness; has a lot of nerve pain in LLE - Currently not doing any exercises  Per chart note from Heidi Holland on 07-15-23:   Heidi Holland is a 48 y.o. female who presents to [facility] today for polyarthralgia. L hip replacement in 2019. PCP and rheum dx her w/ fibromyalgia. Now bilat knees hurt, L>R. Hx of arthroscopy in both knees. She notes chronic pain for the past year and a half. Has had to decrease work to half days. She notes over weakness and lack of strength. Pt also c/o L lateral thigh pain w/ radiating pain distally to her L foot.    PERTINENT HISTORY:  Lt hip THR Nov. 2019, migraines, s/p gastric bypass, fibromyalgia, polyarthralgia; hospitalization 9-5 - 10-23-23 (behavioral health)  PAIN:  Are you having pain? Yes: NPRS scale: 8/10 Pain location: generalized - cervical, thoracic, lumbar Pain description: achy, throbbing, radiating down LLE Aggravating factors: moving or lying down; standing for too long period of time Relieving factors: pt has routine - has TENS unit, heating pad, wrist splints, compression gloves for hands and compression stockings  PRECAUTIONS: Other: None  RED FLAGS: None   WEIGHT BEARING RESTRICTIONS: No  FALLS:  Has patient fallen in last 6 months? No and several stumbles - did fall off of bed couple of months ago - was sitting [place] style on bed and lost balance reaching down for something  LIVING ENVIRONMENT: Lives with: lives alone Lives in: Other  Duplex  Stairs: 1 step only to enter  Has following equipment at home: None  OCCUPATION: works part-time in Engineering geologist  PLOF: Independent  PATIENT GOALS: be able to gain strength; reduce pain  NEXT MD VISIT: 08-19-23 with Heidi Holland:  08-23-23 with Dr. Skeet for Botox  injections; has MRI with anesthesia scheduled on 12-05-23  OBJECTIVE:  Note: Objective measures were completed at Evaluation unless otherwise noted.  DIAGNOSTIC FINDINGS:  MRI is scheduled in Oct. - must be standing MRI per pt due to claustrophobia  PATIENT SURVEYS:  Modified Oswestry:  MODIFIED OSWESTRY DISABILITY SCALE  Date: 08-15-23 Score  Pain intensity 3 =  Pain medication provides me with moderate relief from pain.  2. Personal care (washing, dressing, etc.) 2 =  It is painful to take care of myself, and I am slow and careful.  3. Lifting 4 = I can lift only very light weights  4. Walking 3 =  Pain prevents me from walking more than  mile.  5. Sitting 1 =  I can only sit in my favorite chair as long as I like.  6. Standing 1 =  I can stand as long as I want but, it increases my pain.  7. Sleeping 3 =  Even when I take pain medication, I sleep less than 4 hours.  8. Social Life 3 =  Pain prevents me from going out very often.  9. Traveling 2 =  My pain restricts my travel over 2 hours.  10. Employment/ Homemaking 3 = Pain prevents me from doing anything but light duties.  Total 25/50 ( 50% =severe disability)   Interpretation of scores: Score Category Description  0-20% Minimal Disability The patient can cope with most living activities. Usually no treatment is indicated apart from advice on lifting, sitting and exercise  21-40% Moderate Disability The patient experiences more pain and difficulty with sitting, lifting and standing. Travel and social life are more difficult and they may be disabled from work. Personal care, sexual activity and sleeping are not grossly affected, and the patient can usually be managed by  conservative means  41-60% Severe Disability Pain remains the main problem in this group, but activities of daily living are affected. These patients require a detailed investigation  61-80% Crippled Back pain impinges on all aspects of the patient's life. Positive intervention is required  81-100% Bed-bound  These patients are either bed-bound or exaggerating their symptoms  Bluford FORBES Zoe DELENA Karon DELENA, et al. Surgery versus conservative management of stable thoracolumbar fracture: the PRESTO feasibility RCT. [place] ([place]): [employer]; 2021 Nov. [facility] Technology Assessment, No. 25.62.) Appendix 3, Oswestry Disability Index category descriptors. Available from: FindJewel[employer].cz  Minimally Clinically Important Difference (MCID) = 12.8%  Score 29/50 on 11-04-23 = 58%   COGNITION: Overall cognitive status: Within functional limits for tasks assessed     SENSATION: Numbness and tingling down Lt lateral thigh and sometimes in her hands when she wakes up   POSTURE: No Significant postural limitations  PALPATION: Tenderness with palpation of spinous process area of approx. T10-T12 vertebral level  LUMBAR ROM: WFL's for all motions  AROM eval  Flexion   Extension   Right lateral flexion   Left lateral flexion   Right rotation   Left rotation    (Blank rows = not tested)  LOWER EXTREMITY ROM:  WNL's bil. LE's    LOWER EXTREMITY MMT:    MMT Right eval Left eval  Hip flexion 5 4 - c/o pain with resistance  Hip extension 5 4  Hip abduction 5 4  Hip adduction    Hip internal rotation    Hip external rotation    Knee flexion 4 c/o pain with resistance 4 c/o pain with resistance  Knee extension 5 5  Ankle dorsiflexion 5 5  Ankle plantarflexion    Ankle inversion    Ankle eversion     (Blank rows = not tested)  LUMBAR SPECIAL TESTS:  N/A  FUNCTIONAL TESTS:  5 times sit to stand: 19.34 secs Timed up and go (TUG): 11.22 secs  without device 10 meter walk test: 10.10 secs = 3.25 ft/sec without device  GAIT: Distance walked: 84'  Assistive device utilized: None Level of assistance: Complete Independence Comments: pt reports today is a good day - reports gait pattern changes when pain is increased  Vertigo Assessment: Rt Dix-Hallpike test - no nystagmus and no c/o vertigo noted in test position Lt Dix-Hallpike test (+) for Lt rotary upbeating nystagmus and c/o vertigo in test position   TREATMENT DATE: 11-04-23   PT Re-EVAL                                                                                                                            Canalith Repositioning:  Lt Dix-Hallpike test (+) with Lt rotary upbeating nystagmus and c/o vertigo in test position Rt Dix-Hallpike test (-) with no nystagmus and no c/o vertigo in test position  Pt was treated with 3 reps Epley maneuver for Lt BPPV; symptoms improved on each subsequent rep  Discussed LTG's with pt and status/progress to date  Pt completed Modified Oswestry Disability Scale - score 29/50 = 58% (severe disability category)  TherEx:  5x sit to stand score 14.63 secs from chair without UE support  Pt performed following exercises for low back strengthening/stretching:  Medbridge: Access Code: [email or web address] URL: [email or web address] Date: 11/04/2023 Prepared by: Rock Kussmaul  Exercises - Supine Pelvic Tilt  - 1 x daily - 7 x weekly - 1 sets - 10 reps - Small Range Straight Leg Raise  - 1 x daily - 7 x weekly - 1 sets - 10 reps - Bird Dog  - 1 x daily - 7 x weekly - 3 sets - 10 reps - Supine Bridge  - 1 x daily - 7 x weekly - 1 sets - 10 reps - Marching Bridge  - 1 x daily - 7 x weekly - 1 sets - 10 reps - Supine Bridge with Leg Extension  - 1 x daily - 7 x weekly - 3 sets - 10 reps - Bridge with knees apart/together  - 1 x daily - 7 x weekly - 1 sets- 10 reps  Pt performed single knee to chest 1 rep each leg - 15 sec hold:  bil.  Knee to chest 15 sec hold x 1 rep - pt reported pain radiating down LLE with this stretch so it was discontinued and was not added to HEP  PATIENT EDUCATION:  Education details: Medbridge HEP - see above - modified due to c/o increased back pain - thoracic and lumbar pain reported in today's session (8/10); discontinued theraband exercises for hip strengthening as issued in previous land session on 09-12-23 Person educated: Patient Education method: Explanation, Demonstration, and Handouts Education comprehension: verbalized understanding and returned demonstration  HOME EXERCISE PROGRAM: Mebridge  [email or web address]  ASSESSMENT:  CLINICAL IMPRESSION: Pt returns to PT since previous session on 09-18-23.  Pt reports increased pain in cervical, thoracic and lumbar regions of spine with pt rating pain intensity 8/10 (says 5/10 is a good day).  Pt has met initial LTG #3 with 5x sit to stand score 14.63 secs (improved from initial score of 19.34 secs.  LTG's #1,2, 4 and 5 are not met but are ongoing;  pt's Oswestry Scale score has decreased from 50% to 58% disability score, remains in severe disability category.  Pt previously participated in aquatic therapy and requests to resume this program as she reports it was beneficial in reducing back pain.  Pt had a (+) Lt Dix-Hallpike test in today's session, indicative of Lt BPPV posterior canalithiasis.  Pt will benefit from land and aquatic based PT to address c/o vertigo and thoracic and lumbar pain.    OBJECTIVE IMPAIRMENTS: decreased activity tolerance, decreased endurance, difficulty walking, decreased ROM, decreased strength, and pain.   ACTIVITY LIMITATIONS: carrying, lifting, bending, squatting, and locomotion level  PARTICIPATION LIMITATIONS: cleaning, laundry, shopping, community activity, occupation, and pt does not currently drive  PERSONAL FACTORS: Behavior pattern, Past/current experiences, Time since onset of injury/illness/exacerbation, and 1-2  comorbidities: fibromyalgia and s/p Lt THR are also affecting patient's functional outcome.   REHAB POTENTIAL: Good  CLINICAL DECISION MAKING: Evolving/moderate complexity  EVALUATION COMPLEXITY: Moderate   GOALS: Goals reviewed with patient? Yes   LONG TERM GOALS: Target date: 10-11-23  Pt will report reduced back pain to </= 3/10 to increase tolerance and ease with mobility, ADL's and work activities. Baseline: 5/10 on a good day;  8/10 on 11-04-23 Goal status: Not met 11-04-23 (ongoing)  2.  Improve Modified Oswestry questionnaire score to </= 30% to demo improvement in LBP and functional capabilities.  Baseline: 50% (25/50) on 08-15-23:  11-04-23 -  29/50 = 58% Goal status: Not met 11-04-23 (ongoing)  3.  Pt will improve 5x sit to stand transfer score to </= 15 secs to demo increased LE strength and reduced back pain with this movement. Baseline: 19.34 secs without UE support from chair;  14.63 secs on 11-04-23 Goal status: Goal met 11-04-23  4.  Independent in aquatic HEP and updated land based HEP to be continued upon discharge from PT to improve functional mobility and assist with pain management. Baseline: Dependent Goal status: Ongoing - HEP revised on 11-04-23  5.  Pt will verbalize understanding of Epley maneuver for self treatment of BPPV if needed pending episodic re-occurrence in future.  Baseline: Dependent Goal status: Ongoing 11-04-23   NEW SHORT TERM GOALS:  Target date 11-22-23  1.  Pt will report reduced back pain to </= 6/10 to increase tolerance and ease with mobility, ADL's and work activities. Baseline: 8/10 reported on 11-04-23 Goal status: Revised  2.  Pt will have a (-) Lt Dix-Hallpike test with no nystagmus and no c/o vertigo to indicate resolution of Lt BPPV. Baseline: (+) Lt Dix-Hallpike test on 11-04-23 Goal status:  Ongoing  3.  Improve Modified Oswestry questionnaire score to </= 48% to demo improvement in LBP and functional capabilities.  Baseline: 50% (25/50) on 08-15-23;  29/50 = 58% on 11-04-23 Goal status: Revised   4.  Independent in revised land based HEP for low back strengthening and stretching exercises. Baseline: Dependent Goal status: Revised HEP due to c/o increased LBP on 11-04-23   NEW LONG TERM GOALS: Target date: 12-13-23  Pt will report reduced back pain to </= 4/10 to increase tolerance and ease with mobility, ADL's and work activities. Baseline: 5/10 on a good day;  8/10 intensity reported on 11-04-23 Goal status: Revised  2.  Improve Modified Oswestry questionnaire score to </= 45% for MCID, to demo improvement in LBP and functional capabilities.  Baseline: 50% (25/50) on 08-15-23:  11-04-23 -  29/50 = 58% Goal status: Revised   3.  Pt will improve 5x sit to stand transfer score to </= 11 secs to demo increased LE strength and reduced back pain with this movement. Baseline: 19.34 secs without UE support from chair;  14.63 secs on 11-04-23 Goal status: Goal upgraded   4.  Independent in aquatic HEP and updated land based HEP to be continued upon discharge from PT to improve functional mobility and assist with pain management. Baseline: Dependent Goal status: Ongoing - HEP revised on 11-04-23  5.  Pt will verbalize understanding of Epley maneuver for self treatment of BPPV if needed pending episodic re-occurrence in future.  Baseline: Dependent Goal status: Ongoing 11-04-23   PLAN:  PT FREQUENCY: 2x/week (1 land/1 aquatic)  PT DURATION: 6 weeks   PLANNED INTERVENTIONS: 97110-Therapeutic exercises, 97530- Therapeutic activity, [id number]- Neuromuscular re-education, 97535- Self Care, 02859- Manual therapy, [id number]- Gait training, [phone]- Canalith repositioning, [id number]- Aquatic Therapy, and [id number]- Electrical stimulation (manual).  PLAN FOR NEXT SESSION: land - recheck Lt BPPV; check revised HEP for questions/problems  Aquatic - water walking, core stabilization & strengthening   Embry Manrique, [place],  PT 11/04/2023, 6:17 PM   Check all possible CPT codes: 02889- Therapeutic Exercise, [phone]- Neuro Re-education, [phone] - Gait Training, [phone] - Manual Therapy, [phone] - Therapeutic Activities, [phone] - Self Care, [phone] - Electrical stimulation (Manual), [phone] - Aquatic therapy, and [phone] - Canalith Repositioning    Check all conditions that are expected to impact treatment: Musculoskeletal disorders and Psychological or psychiatric disorders   If treatment provided at initial evaluation, no treatment charged due to lack of authorization.

## 2023-11-05 ENCOUNTER — Ambulatory Visit: Payer: Self-pay | Admitting: Rehabilitation

## 2023-11-05 ENCOUNTER — Ambulatory Visit
Admission: RE | Admit: 2023-11-05 | Discharge: 2023-11-05 | Disposition: A | Discharge status: Home / Self Care | Source: Ambulatory Visit | Attending: Obstetrics and Gynecology | Admitting: Obstetrics and Gynecology

## 2023-11-05 ENCOUNTER — Encounter: Payer: Self-pay | Admitting: Rehabilitation

## 2023-11-05 DIAGNOSIS — M5459 Other low back pain: Secondary | ICD-10-CM

## 2023-11-05 DIAGNOSIS — M546 Pain in thoracic spine: Secondary | ICD-10-CM

## 2023-11-05 DIAGNOSIS — R928 Other abnormal and inconclusive findings on diagnostic imaging of breast: Secondary | ICD-10-CM

## 2023-11-05 DIAGNOSIS — M25552 Pain in left hip: Secondary | ICD-10-CM

## 2023-11-05 NOTE — Therapy (Signed)
 OUTPATIENT PHYSICAL THERAPY/NEURO TREATMENT NOTE   Patient Name: Heidi Holland MRN: [id number] DOB:[birth year], 48 y.o., female Today's Date: 11/05/2023  END OF SESSION:  PT End of Session - 11/05/23 0818     Visit Number 7    Number of Visits 17   6 land visits, 6 aquatic visits   Date for Recertification  12/13/23    Authorization Type [employer] - no auth required    Authorization - Number of Visits 27    PT Start Time 1014    PT Stop Time 1100    PT Time Calculation (min) 46 min    Equipment Utilized During Treatment Other (comment)   floatation devices as needed for safety   Activity Tolerance Patient tolerated treatment well    Behavior During Therapy WFL for tasks assessed/performed           Past Medical History:  Diagnosis Date   Abnormal Pap smear of cervix    Allergy    Anxiety    Asthma    Bipolar 1 disorder (HCC)    Chronic pain    Depression    Diabetes mellitus without complication (HCC)    Diverticulitis    GERD (gastroesophageal reflux disease)    HPV in female    Hyperlipidemia    Hypertension    Migraines    Osteoarthritis    Ovarian cyst    right   PCOS (polycystic ovarian syndrome)    STD (sexually transmitted disease)    Trichomonas contact, treated    Vertigo    Past Surgical History:  Procedure Laterality Date   BOWEL RESECTION     gasticbypass     GASTRIC BYPASS  2016   KNEE ARTHROSCOPY Bilateral    SHOULDER SURGERY Left    TOTAL HIP ARTHROPLASTY Left    Patient Active Problem List   Diagnosis Date Noted   PTSD (post-traumatic stress disorder) 10/30/2023   Generalized anxiety disorder 10/19/2023   Mood disorder 10/19/2023   Bipolar 1 disorder, mixed, severe (HCC) 10/18/2023   Meralgia paresthetica of left side 07/15/2023   Fibromyalgia 06/27/2023   Ovarian cyst 02/20/2023   Essential hypertension 02/20/2023   Bipolar 1 disorder, depressed (HCC) 02/16/2022   Paresthesia 09/13/2021   CIN I (cervical intraepithelial  neoplasia I) 03/03/2021   LSIL pap smear of cervix/human papillomavirus (HPV) positive 10/24/2020   Family history of early CAD 10/24/2020   B12 deficiency 07/26/2020   Somatic dysfunction of spine, cervical 07/21/2020   Status post left hip replacement 03/12/2019   T2DM (type 2 diabetes mellitus) (HCC) 07/31/2018   Dyslipidemia associated with type 2 diabetes mellitus (HCC) 07/31/2018   Allergic rhinitis 07/31/2018   Mild intermittent asthma, uncomplicated 07/31/2018   GERD (gastroesophageal reflux disease) 07/31/2018   S/P gastric bypass 07/31/2018   Migraine 07/31/2018   Anxiety 07/31/2018   Depression, major, single episode, complete remission 07/31/2018   Insomnia 07/31/2018   Umbilical hernia 07/31/2018   Iron deficiency anemia 06/09/2018   Cervicogenic headache 05/12/2018   Low back pain 05/12/2018   Polyarthralgia 05/12/2018    PCP: Heidi Bart, MD  REFERRING PROVIDER: Joane Artist RAMAN, MD; Heidi Juliene SAUNDERS, DO  REFERRING DIAG:  Diagnosis  M25.50 (ICD-10-CM) - Polyarthralgia  M79.7 (ICD-10-CM) - Fibromyalgia  R53.1 (ICD-10-CM) - Weakness   H81.13 (ICD-10-CM) - Benign paroxysmal positional vertigo due to bilateral vestibular disorder  Rationale for Evaluation and Treatment: Rehabilitation  THERAPY DIAG:  Other low back pain  Pain in thoracic spine  Pain in left hip  ONSET DATE: early 2024 for onset of chronic pain: Referral date 07-15-23; NEW REFERRAL 10-17-23 from Dr. Joane  SUBJECTIVE:                                                                                                                                                                                           SUBJECTIVE STATEMENT:  Pt reports feeling okay this morning, just having some lower leg cramps and tightness.  Is nervous for appt later today.  Has friend picking her up from pool to take her there.    From previous OP PT admission -- Pt reports Lt leg feels weaker than RLE; says RLE is starting  to get weaker based on compensation for LLE weakness; has a lot of nerve pain in LLE - Currently not doing any exercises  Per chart note from Dr. Joane on 07-15-23:   Heidi Holland is a 48 y.o. female who presents to [facility] today for polyarthralgia. L hip replacement in 2019. PCP and rheum dx her w/ fibromyalgia. Now bilat knees hurt, L>R. Hx of arthroscopy in both knees. She notes chronic pain for the past year and a half. Has had to decrease work to half days. She notes over weakness and lack of strength. Pt also c/o L lateral thigh pain w/ radiating pain distally to her L foot.    PERTINENT HISTORY:  Lt hip THR Nov. 2019, migraines, s/p gastric bypass, fibromyalgia, polyarthralgia; hospitalization 9-5 - 10-23-23 (behavioral health)  PAIN:  Are you having pain? Yes: NPRS scale: 8/10 Pain location: generalized - cervical, thoracic, lumbar Pain description: achy, throbbing, radiating down LLE Aggravating factors: moving or lying down; standing for too long period of time Relieving factors: pt has routine - has TENS unit, heating pad, wrist splints, compression gloves for hands and compression stockings  PRECAUTIONS: Other: None  RED FLAGS: None   WEIGHT BEARING RESTRICTIONS: No  FALLS:  Has patient fallen in last 6 months? No and several stumbles - did fall off of bed couple of months ago - was sitting [place] style on bed and lost balance reaching down for something  LIVING ENVIRONMENT: Lives with: lives alone Lives in: Other Duplex Stairs: 1 step only to enter  Has following equipment at home: None  OCCUPATION: works part-time in Engineering geologist  PLOF: Independent  PATIENT GOALS: be able to gain strength; reduce pain  NEXT MD VISIT: 08-19-23 with Dr. Joane:  08-23-23 with Dr. Skeet for Botox  injections; has MRI with anesthesia scheduled on 12-05-23  OBJECTIVE:  Note: Objective measures were completed at Evaluation unless otherwise noted.  DIAGNOSTIC  FINDINGS:  MRI  is scheduled in Oct. - must be standing MRI per pt due to claustrophobia  PATIENT SURVEYS:  Modified Oswestry:  MODIFIED OSWESTRY DISABILITY SCALE  Date: 08-15-23 Score  Pain intensity 3 =  Pain medication provides me with moderate relief from pain.  2. Personal care (washing, dressing, etc.) 2 =  It is painful to take care of myself, and I am slow and careful.  3. Lifting 4 = I can lift only very light weights  4. Walking 3 =  Pain prevents me from walking more than  mile.  5. Sitting 1 =  I can only sit in my favorite chair as long as I like.  6. Standing 1 =  I can stand as long as I want but, it increases my pain.  7. Sleeping 3 =  Even when I take pain medication, I sleep less than 4 hours.  8. Social Life 3 =  Pain prevents me from going out very often.  9. Traveling 2 =  My pain restricts my travel over 2 hours.  10. Employment/ Homemaking 3 = Pain prevents me from doing anything but light duties.  Total 25/50 ( 50% =severe disability)   Interpretation of scores: Score Category Description  0-20% Minimal Disability The patient can cope with most living activities. Usually no treatment is indicated apart from advice on lifting, sitting and exercise  21-40% Moderate Disability The patient experiences more pain and difficulty with sitting, lifting and standing. Travel and social life are more difficult and they may be disabled from work. Personal care, sexual activity and sleeping are not grossly affected, and the patient can usually be managed by conservative means  41-60% Severe Disability Pain remains the main problem in this group, but activities of daily living are affected. These patients require a detailed investigation  61-80% Crippled Back pain impinges on all aspects of the patient's life. Positive intervention is required  81-100% Bed-bound  These patients are either bed-bound or exaggerating their symptoms  Bluford FORBES Zoe DELENA Karon DELENA, et al. Surgery versus  conservative management of stable thoracolumbar fracture: the PRESTO feasibility RCT. [place]): [employer]; 2021 Nov. [facility] Technology Assessment, No. 25.62.) Appendix 3, Oswestry Disability Index category descriptors. Available from: FindJewelers.cz  Minimally Clinically Important Difference (MCID) = 12.8%  Score 29/50 on 11-04-23 = 58%   COGNITION: Overall cognitive status: Within functional limits for tasks assessed     SENSATION: Numbness and tingling down Lt lateral thigh and sometimes in her hands when she wakes up   POSTURE: No Significant postural limitations  PALPATION: Tenderness with palpation of spinous process area of approx. T10-T12 vertebral level  LUMBAR ROM: WFL's for all motions  AROM eval  Flexion   Extension   Right lateral flexion   Left lateral flexion   Right rotation   Left rotation    (Blank rows = not tested)  LOWER EXTREMITY ROM:  WNL's bil. LE's    LOWER EXTREMITY MMT:    MMT Right eval Left eval  Hip flexion 5 4 - c/o pain with resistance  Hip extension 5 4  Hip abduction 5 4  Hip adduction    Hip internal rotation    Hip external rotation    Knee flexion 4 c/o pain with resistance 4 c/o pain with resistance  Knee extension 5 5  Ankle dorsiflexion 5 5  Ankle plantarflexion    Ankle inversion    Ankle eversion     (Blank rows = not tested)  LUMBAR  SPECIAL TESTS:  N/A  FUNCTIONAL TESTS:  5 times sit to stand: 19.34 secs Timed up and go (TUG): 11.22 secs without device 10 meter walk test: 10.10 secs = 3.25 ft/sec without device  GAIT: Distance walked: 54'  Assistive device utilized: None Level of assistance: Complete Independence Comments: pt reports today is a good day - reports gait pattern changes when pain is increased  Vertigo Assessment: Rt Dix-Hallpike test - no nystagmus and no c/o vertigo noted in test position Lt Dix-Hallpike test (+) for Lt rotary upbeating  nystagmus and c/o vertigo in test position   TREATMENT DATE:    11-05-23                                                                                                                             Patient seen for aquatic therapy today.  Treatment took place in water 3.6-4.8 feet deep depending upon activity.  Pt entered and exited the pool via stairs using rails for support.  Pool temp approx 92 deg.   Warm up:  Walking forwards x approx 18' x 4 laps, backwards x 4 laps and side stepping x 4 laps.  Added in yellow barbells to last two laps moving them in add/abd with LEs. Pt tolerated well.    Forward marching x 4 laps holding yellow barbells with cues for relaxed posture and slower movement for more balance challenge.  Standing hip flex/ext (with knee in ext) holding noodle x 10 reps.  Added in hip abd for 3 way hip x 10 more reps each side starting with noodle but then removing and doing without support.  Min LOB but pt able to self correct.  Step ups to first step with single UE support>no UE support with opposite march x 10 reps.  Cues for forward weight shift for improved alignment. Sit<>stand from 3rd step with feet on 1st step x 10 reps without UE support.  Cues for relaxed posture.  Stretching:  Standing runner's stretch x 30 sec each side, with back on wall performed hamstring stretch and groin stretch with noodle x 30 secs each (each LE) then performed calf stretch with heels off edge of step (alt) x 10 reps.    Ai chi postures for balance and core strengthening:  Soothing x 10 reps, Gathering x 10 reps, Accepting x 10 leading into Accepting with Ronnald x 10 reps.  Pt needing cues and demo for correct technique and staggered stance was a moderate balance challenge.  Also performed Balancing with modification of holding onto wall with single UE.    Pt requires buoyancy of water for support for reduced fall risk and for unloading/reduced stress on joints (spine) as pt able to tolerate  increased standing and ambulation in water compared to that on land; viscosity of water is needed for resistance for strengthening and current of water provides perturbations for challenge for balance training     PATIENT EDUCATION:  Education details: aquatic rationale  Person educated: Patient Education method: Explanation, Demonstration, and Handouts Education comprehension: verbalized understanding and returned demonstration  HOME EXERCISE PROGRAM: [facility]  [id number]  ASSESSMENT:  CLINICAL IMPRESSION: Pt returns to pool since early August.  Was excited to get back into pool.  Pt tolerated strengthening and balance exercises well.  Had most difficulty in staggered stance with weight shifting and did well with most SLS tasks.  No reports of dizziness today.   OBJECTIVE IMPAIRMENTS: decreased activity tolerance, decreased endurance, difficulty walking, decreased ROM, decreased strength, and pain.   ACTIVITY LIMITATIONS: carrying, lifting, bending, squatting, and locomotion level  PARTICIPATION LIMITATIONS: cleaning, laundry, shopping, community activity, occupation, and pt does not currently drive  PERSONAL FACTORS: Behavior pattern, Past/current experiences, Time since onset of injury/illness/exacerbation, and 1-2 comorbidities: fibromyalgia and s/p Lt THR are also affecting patient's functional outcome.   REHAB POTENTIAL: Good  CLINICAL DECISION MAKING: Evolving/moderate complexity  EVALUATION COMPLEXITY: Moderate   GOALS: Goals reviewed with patient? Yes   LONG TERM GOALS: Target date: 10-11-23  Pt will report reduced back pain to </= 3/10 to increase tolerance and ease with mobility, ADL's and work activities. Baseline: 5/10 on a good day;  8/10 on 11-04-23 Goal status: Not met 11-04-23 (ongoing)  2.  Improve Modified Oswestry questionnaire score to </= 30% to demo improvement in LBP and functional capabilities.  Baseline: 50% (25/50) on 08-15-23:  11-04-23 -  29/50 =  58% Goal status: Not met 11-04-23 (ongoing)  3.  Pt will improve 5x sit to stand transfer score to </= 15 secs to demo increased LE strength and reduced back pain with this movement. Baseline: 19.34 secs without UE support from chair;  14.63 secs on 11-04-23 Goal status: Goal met 11-04-23  4.  Independent in aquatic HEP and updated land based HEP to be continued upon discharge from PT to improve functional mobility and assist with pain management. Baseline: Dependent Goal status: Ongoing - HEP revised on 11-04-23  5.  Pt will verbalize understanding of Epley maneuver for self treatment of BPPV if needed pending episodic re-occurrence in future.  Baseline: Dependent Goal status: Ongoing 11-04-23   NEW SHORT TERM GOALS:  Target date 11-22-23  1.  Pt will report reduced back pain to </= 6/10 to increase tolerance and ease with mobility, ADL's and work activities. Baseline: 8/10 reported on 11-04-23 Goal status: Revised  2.  Pt will have a (-) Lt Dix-Hallpike test with no nystagmus and no c/o vertigo to indicate resolution of Lt BPPV. Baseline: (+) Lt Dix-Hallpike test on 11-04-23 Goal status:  Ongoing  3.  Improve Modified Oswestry questionnaire score to </= 48% to demo improvement in LBP and functional capabilities.  Baseline: 50% (25/50) on 08-15-23;  29/50 = 58% on 11-04-23 Goal status: Revised   4.  Independent in revised land based HEP for low back strengthening and stretching exercises. Baseline: Dependent Goal status: Revised HEP due to c/o increased LBP on 11-04-23   NEW LONG TERM GOALS: Target date: 12-13-23  Pt will report reduced back pain to </= 4/10 to increase tolerance and ease with mobility, ADL's and work activities. Baseline: 5/10 on a good day;  8/10 intensity reported on 11-04-23 Goal status: Revised  2.  Improve Modified Oswestry questionnaire score to </= 45% for MCID, to demo improvement in LBP and functional capabilities.  Baseline: 50% (25/50) on 08-15-23:  11-04-23  -  29/50 = 58% Goal status: Revised   3.  Pt will improve 5x sit to stand transfer score to </=  11 secs to demo increased LE strength and reduced back pain with this movement. Baseline: 19.34 secs without UE support from chair;  14.63 secs on 11-04-23 Goal status: Goal upgraded   4.  Independent in aquatic HEP and updated land based HEP to be continued upon discharge from PT to improve functional mobility and assist with pain management. Baseline: Dependent Goal status: Ongoing - HEP revised on 11-04-23  5.  Pt will verbalize understanding of Epley maneuver for self treatment of BPPV if needed pending episodic re-occurrence in future.  Baseline: Dependent Goal status: Ongoing 11-04-23   PLAN:  PT FREQUENCY: 2x/week (1 land/1 aquatic)  PT DURATION: 6 weeks   PLANNED INTERVENTIONS: 97110-Therapeutic exercises, 97530- Therapeutic activity, [id number]- Neuromuscular re-education, 97535- Self Care, 02859- Manual therapy, [id number]- Gait training, [phone]- Canalith repositioning, [id number]- Aquatic Therapy, and [id number]- Electrical stimulation (manual).  PLAN FOR NEXT SESSION: land - recheck Lt BPPV; check revised HEP for questions/problems  Aquatic - water walking, core stabilization & strengthening  Damien Fought, PT, MPT [facility] [address] [place], [place], [address] Phone: [phone]   Fax:  [phone] 11/05/23, 12:38 PM   Check all possible CPT codes: 97110- Therapeutic Exercise, [phone]- Neuro Re-education, [phone] - Gait Training, [phone] - Manual Therapy, [phone] - Therapeutic Activities, [phone] - Self Care, [phone] - Electrical stimulation (Manual), [id number] - Aquatic therapy, and [phone] - Canalith Repositioning    Check all conditions that are expected to impact treatment: Musculoskeletal disorders and Psychological or psychiatric disorders   If treatment provided at initial evaluation, no treatment charged due to lack of authorization.

## 2023-11-06 ENCOUNTER — Ambulatory Visit (HOSPITAL_COMMUNITY)

## 2023-11-06 ENCOUNTER — Ambulatory Visit: Payer: Self-pay | Admitting: Obstetrics and Gynecology

## 2023-11-06 ENCOUNTER — Encounter (HOSPITAL_COMMUNITY): Payer: Self-pay

## 2023-11-11 ENCOUNTER — Ambulatory Visit (INDEPENDENT_AMBULATORY_CARE_PROVIDER_SITE_OTHER): Admitting: Licensed Clinical Social Worker

## 2023-11-11 ENCOUNTER — Ambulatory Visit: Payer: Self-pay | Admitting: Physical Therapy

## 2023-11-11 DIAGNOSIS — F319 Bipolar disorder, unspecified: Secondary | ICD-10-CM | POA: Diagnosis not present

## 2023-11-11 DIAGNOSIS — F4310 Post-traumatic stress disorder, unspecified: Secondary | ICD-10-CM

## 2023-11-12 ENCOUNTER — Encounter (HOSPITAL_COMMUNITY): Payer: Self-pay | Admitting: Licensed Clinical Social Worker

## 2023-11-12 ENCOUNTER — Ambulatory Visit (INDEPENDENT_AMBULATORY_CARE_PROVIDER_SITE_OTHER): Admitting: Licensed Clinical Social Worker

## 2023-11-12 ENCOUNTER — Ambulatory Visit: Payer: Self-pay | Admitting: Physical Therapy

## 2023-11-12 ENCOUNTER — Ambulatory Visit: Payer: Self-pay | Admitting: Rehabilitation

## 2023-11-12 DIAGNOSIS — F4310 Post-traumatic stress disorder, unspecified: Secondary | ICD-10-CM

## 2023-11-12 DIAGNOSIS — F319 Bipolar disorder, unspecified: Secondary | ICD-10-CM

## 2023-11-12 NOTE — Psych (Signed)
 Heidi Holland [facility] PHP THERAPIST PROGRESS NOTE  Heidi Holland [id number]   Session Time: 9:00 am - 10:00 am  Participation Level: Active  Behavioral Response: CasualAlertAnxious and Depressed  Type of Therapy: Group Therapy  Treatment Goals addressed: Coping  Progress Towards Goals: Initial  Interventions: CBT, DBT, Solution Focused, Strength-based, Supportive, and Reframing  Therapist Response: Clinician led check-in regarding current stressors and situation, and review of patient completed daily inventory. Clinician utilized active listening and empathetic response and validated patient emotions. Clinician facilitated processing group on pertinent issues.?   Summary: Patient arrived within time allowed. Patient rates their depression at a 5 and anxiety at a 5 on a scale of 1-10 with 10 being best. Pt reports she continues to struggle with feelings of worthlessness and feeling like a burden. She also shares that she is struggling with the idea of moving in with her sister. When asked about sleep and appetite, pt reports she slept 7 hours last night and ate 3 meals yesterday. Pt denied experiencing SI/SH thoughts and endorsed feelings of hopelessness since last session. Pt able to process.?Pt engaged in discussion.?      Session Time: 10:00 am - 11:00 am  Participation Level: Active  Behavioral Response: CasualAlertAnxious and Depressed  Type of Therapy: Group Therapy  Treatment Goals addressed: Coping  Progress Towards Goals: Initial  Interventions: CBT, DBT, Solution Focused, Strength-based, Supportive, and Reframing  Therapist Response: Clinician led processing group for pt's current struggles. Group members shared stressors and provided support and feedback. Clinician brought in topics of cognitive distortions and self-compassion to inform discussion.  Summary: Pt able to process and provide support to group. She is able to reframe the negative self-talk she currently struggles  with. She demonstrates good insight.    Session Time: 11:00 am - 12:00 pm  Participation Level: Active  Behavioral Response: CasualAlertAnxious and Depressed  Type of Therapy: Group Therapy  Treatment Goals addressed: Coping  Progress Towards Goals: Initial  Interventions: CBT, DBT, Solution Focused, Strength-based, Supportive, and Reframing  Therapist Response: Group was led by Heidi Holland chaplain, Heidi Holland.  Summary: Pt engaged in discussion.    Session Time: 12:00 pm - 12:30 pm  Participation Level: Active  Behavioral Response: CasualAlertAnxious and Depressed  Type of Therapy: Group Therapy  Treatment Goals addressed: Coping  Progress Towards Goals: Initial  Interventions: Psychologist, occupational, Supportive  Therapist Response: Reflection Group: Patients encouraged to practice skills and interpersonal techniques or work on mindfulness and relaxation techniques. The importance of self-care and making skills part of a routine to increase usage were stressed.  Summary: Patient engaged and participated appropriately.   Session Time: 12:30 pm - 1:30 pm  Participation Level: Active  Behavioral Response: CasualAlertAnxious and Depressed  Type of Therapy: Group Therapy  Treatment Goals addressed: Coping  Progress Towards Goals: Initial  Interventions: CBT, DBT, Solution Focused, Strength-based, Supportive, and Reframing  Therapist Response: Group viewed Ted Talk entitled How To Not Take Things Personally presented by Heidi Holland. Cln utilized CBT principles to inform discussion while pts were encouraged to share their response to the video. Cln asked each patient to share a time in which they took things personally and reframed the situation based on what was shared in the [place] Talk.   Summary: Pt engaged and participated in discussion. She identified a times in which she has taken things personally and was able to reframe. Patients were dismissed at 1:30 pm  due to OT being absent. At check-out, patient contracts for safety.?Patient demonstrates progress  as evidenced by her engagement and by being receptive to treatment. Patient denies SI/HI/self-harm thoughts at the end of group and agrees to seek help should those thoughts/feelings occur.?  Suicidal/Homicidal: Nowithout intent/plan  Plan: ?Pt Heidi continue in PHP and medication management while continuing to work on decreasing depression symptoms,?SI, and anxiety symptoms,?and increasing the ability to self manage symptoms.     Collaboration of Care: Other RN Heidi Holland  Patient/Guardian was advised Release of Information must be obtained prior to any record release in order to collaborate their care with an outside provider. Patient/Guardian was advised if they have not already done so to contact the registration department to sign all necessary forms in order for us  to release information regarding their care.   Consent: Patient/Guardian gives verbal consent for treatment and assignment of benefits for services provided during this visit. Patient/Guardian expressed understanding and agreed to proceed.   Diagnosis: Bipolar 1 disorder, depressed (HCC) [F31.9]    1. Bipolar 1 disorder, depressed (HCC)   2. PTSD (post-traumatic stress disorder)       Heidi LILLETTE Pollack, LCSW 11/12/2023

## 2023-11-12 NOTE — Psych (Signed)
 [facility] PHP THERAPIST PROGRESS NOTE  Heidi Holland [id number]   Session Time: 9:00 am - 10:00 am  Participation Level: Active  Behavioral Response: CasualAlertAnxious and Depressed  Type of Therapy: Group Therapy  Treatment Goals addressed: Coping  Progress Towards Goals: Initial  Interventions: CBT, DBT, Solution Focused, Strength-based, Supportive, and Reframing  Therapist Response: Clinician led check-in regarding current stressors and situation, and review of patient completed daily inventory. Clinician utilized active listening and empathetic response and validated patient emotions. Clinician facilitated processing group on pertinent issues.?   Summary: Patient arrived within time allowed. Patient rates their depression at a 7 and anxiety at a 8 on a scale of 1-10 with 10 being best. Pt reports she is struggling with a loss of independence because she has to move in with her sister and her fiance due to physical health issues. She describes how this impacts her mental health. When asked about sleep and appetite, pt reports she slept 8 hours last night and ate 3 meals yesterday. Pt denied experiencing SI/SH thoughts and endorsed feelings of hopelessness. Pt able to process.?Pt engaged in discussion.?      Session Time: 10:00 am - 11:00 am  Participation Level: Active  Behavioral Response: CasualAlertAnxious and Depressed  Type of Therapy: Group Therapy  Treatment Goals addressed: Coping  Progress Towards Goals: Initial  Interventions: CBT, DBT, Solution Focused, Strength-based, Supportive, and Reframing  Therapist Response: Clinician led processing group for pt's current struggles. Group members shared stressors and provided support and feedback. Clinician brought in topics of shame and acceptance to inform discussion.  Summary: Pt able to process and provide support to group. She shares feelings of worthlessness and feeling like a burden to others.    Session Time:  11:00 am - 12:00 pm  Participation Level: Active  Behavioral Response: CasualAlertAnxious and Depressed  Type of Therapy: Group Therapy  Treatment Goals addressed: Coping  Progress Towards Goals: Initial  Interventions: CBT, DBT, Solution Focused, Strength-based, Supportive, and Reframing  Therapist Response: Clinician led group on Cognitive Distortions. Clinician listed different types of cognitive distortions and provided examples. Pts were asked to provide personal examples of cognitive distortions they currently experience or have previously experienced. Patients were also asked to complete a worksheet that utilizes socratic questioning to challenge cognitive distortions.  Summary: Pt engaged in discussion, was receptive to feedback, and demonstrated good insight into the subject matter.   Session Time: 12:00 pm - 12:30 pm  Participation Level: Active  Behavioral Response: CasualAlertAnxious and Depressed  Type of Therapy: Group Therapy  Treatment Goals addressed: Coping  Progress Towards Goals: Initial  Interventions: Psychologist, occupational, Supportive  Therapist Response: Reflection Group: Patients encouraged to practice skills and interpersonal techniques or work on mindfulness and relaxation techniques. The importance of self-care and making skills part of a routine to increase usage were stressed.  Summary: Patient engaged and participated appropriately.   Session Time: 12:30 pm - 1:00 pm  Participation Level: Active  Behavioral Response: CasualAlertAnxious and Depressed  Type of Therapy: Group Therapy  Treatment Goals addressed: Coping  Progress Towards Goals: Initial  Interventions: CBT, DBT, Solution Focused, Strength-based, Supportive, and Reframing  Therapist Response: Clinician continued group on cognitive distortions. Pts were asked to identify a specific cognitive distortion they experience and clinician asked questions to identify facts, beliefs,  and feelings contributing to the distortions and assisted with composing reframing statements. Clinician utilized CBT principles to inform discussion.  Summary: Pt engaged and participated in discussion.   Session Time: 1:00  pm - 1:30 pm  Participation Level: Active  Behavioral Response: CasualAlertAnxious and Depressed  Type of Therapy: Group Therapy  Treatment Goals addressed: Coping  Progress Towards Goals: Initial  Interventions: CBT, DBT, Solution Focused, Strength-based, Supportive, and Reframing  Therapist Response: Clinician utilized a guided meditation video led by yoga instructor Adriene of [employer] that focuses on controlled breathing. Patients were invited to share their responses upon completion.   Summary: Pt participated in a portion of the activity but left the group room to answer a phone call and was gone for several minutes during the meditation. Patients were dismissed at 1:30 pm due to OT being absent. At check-out, patient contracts for safety.?Patient demonstrates progress as evidenced by her continued engagement and by being receptive to treatment. Patient denies SI/HI/self-harm thoughts at the end of group and agrees to seek help should those thoughts/feelings occur.?  Suicidal/Homicidal: Nowithout intent/plan  Plan: ?Pt will continue in PHP and medication management while continuing to work on decreasing depression symptoms,?SI, and anxiety symptoms,?and increasing the ability to self manage symptoms.      Collaboration of Care: Medication Management AEB Staci Kerns, NP  Patient/Guardian was advised Release of Information must be obtained prior to any record release in order to collaborate their care with an outside provider. Patient/Guardian was advised if they have not already done so to contact the registration department to sign all necessary forms in order for us  to release information regarding their care.   Consent: Patient/Guardian gives  verbal consent for treatment and assignment of benefits for services provided during this visit. Patient/Guardian expressed understanding and agreed to proceed.   Diagnosis: Bipolar 1 disorder, depressed (HCC) [F31.9]    1. Bipolar 1 disorder, depressed (HCC)   2. PTSD (post-traumatic stress disorder)       Will LILLETTE Pollack, LCSW 11/12/2023
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
## 2023-11-13 ENCOUNTER — Ambulatory Visit (INDEPENDENT_AMBULATORY_CARE_PROVIDER_SITE_OTHER): Admitting: Licensed Clinical Social Worker

## 2023-11-13 ENCOUNTER — Other Ambulatory Visit (HOSPITAL_COMMUNITY): Payer: Self-pay

## 2023-11-13 ENCOUNTER — Telehealth: Payer: Self-pay | Admitting: Pharmacy Technician

## 2023-11-13 ENCOUNTER — Other Ambulatory Visit: Payer: Self-pay

## 2023-11-13 DIAGNOSIS — F319 Bipolar disorder, unspecified: Secondary | ICD-10-CM | POA: Diagnosis not present

## 2023-11-13 DIAGNOSIS — F4310 Post-traumatic stress disorder, unspecified: Secondary | ICD-10-CM

## 2023-11-13 MED ORDER — ONABOTULINUMTOXINA 200 UNITS IJ SOLR
INTRAMUSCULAR | 4 refills | Status: DC
  Filled 2023-11-13: qty 1, 34d supply, fill #0
  Filled 2024-02-20: qty 1, 34d supply, fill #1

## 2023-11-13 NOTE — Telephone Encounter (Signed)
 [facility] received new Rx, and will courier to office on 10.7.25

## 2023-11-13 NOTE — Telephone Encounter (Signed)
 Received notice from [facility] that Botox  was discontinued by a behavioral health provider, however pt has an appt schedule for 10.10.25. If patient is to continue with this medication please send a new prescription to [facility]. If patient is truly discontinuing this medication, please let me know so that pt can be dis enrolled from the refill program at [facility].

## 2023-11-13 NOTE — Telephone Encounter (Signed)
 Per patient that is not correct please add Botox  back. She will be at her visit.

## 2023-11-13 NOTE — Progress Notes (Signed)
 Specialty Pharmacy Refill Coordination Note  Heidi Holland is a 48 y.o. female contacted today regarding refills of specialty medication(s) OnabotulinumtoxinA  (BOTOX )   Patient requested Courier to Provider Office   Delivery date: 11/19/23   Verified address: [facility]  [address] [place] [address] [place], [place], [address]   Medication will be filled on 11/18/23.

## 2023-11-14 ENCOUNTER — Ambulatory Visit (INDEPENDENT_AMBULATORY_CARE_PROVIDER_SITE_OTHER): Admitting: Licensed Clinical Social Worker

## 2023-11-14 DIAGNOSIS — F319 Bipolar disorder, unspecified: Secondary | ICD-10-CM | POA: Diagnosis not present

## 2023-11-14 DIAGNOSIS — F4310 Post-traumatic stress disorder, unspecified: Secondary | ICD-10-CM

## 2023-11-15 ENCOUNTER — Other Ambulatory Visit: Payer: Self-pay

## 2023-11-15 ENCOUNTER — Ambulatory Visit (INDEPENDENT_AMBULATORY_CARE_PROVIDER_SITE_OTHER): Admitting: Licensed Clinical Social Worker

## 2023-11-15 ENCOUNTER — Other Ambulatory Visit: Payer: Self-pay | Admitting: Oncology

## 2023-11-15 DIAGNOSIS — F319 Bipolar disorder, unspecified: Secondary | ICD-10-CM | POA: Diagnosis not present

## 2023-11-15 DIAGNOSIS — F4310 Post-traumatic stress disorder, unspecified: Secondary | ICD-10-CM

## 2023-11-15 DIAGNOSIS — D509 Iron deficiency anemia, unspecified: Secondary | ICD-10-CM

## 2023-11-16 ENCOUNTER — Telehealth: Admitting: Nurse Practitioner

## 2023-11-16 ENCOUNTER — Encounter: Payer: Self-pay | Admitting: Nurse Practitioner

## 2023-11-16 ENCOUNTER — Other Ambulatory Visit: Payer: Self-pay | Admitting: Nurse Practitioner

## 2023-11-16 DIAGNOSIS — J019 Acute sinusitis, unspecified: Secondary | ICD-10-CM | POA: Diagnosis not present

## 2023-11-16 DIAGNOSIS — B9689 Other specified bacterial agents as the cause of diseases classified elsewhere: Secondary | ICD-10-CM

## 2023-11-16 MED ORDER — FLUCONAZOLE 150 MG PO TABS: 150.0000 mg | ORAL_TABLET | ORAL | 0 refills | Status: DC | PRN

## 2023-11-16 MED ORDER — MECLIZINE HCL 25 MG PO TABS: 25.0000 mg | ORAL_TABLET | Freq: Three times a day (TID) | ORAL | 0 refills | Status: DC | PRN

## 2023-11-16 MED ORDER — AMOXICILLIN-POT CLAVULANATE 875-125 MG PO TABS: 1.0000 | ORAL_TABLET | Freq: Two times a day (BID) | ORAL | 0 refills | Status: AC

## 2023-11-16 NOTE — Progress Notes (Signed)
 Psychiatric Initial Adult Assessment   Patient Identification: Heidi Holland MRN:  [id number] Date of Evaluation:  11/11/2023 Referral Source: Behavioral health inpatient admission recent discharge Chief Complaint: Worsening depression and anxiety symptoms.  Visit Diagnosis:    ICD-10-CM   1. Bipolar 1 disorder, depressed (HCC)  F31.9     2. PTSD (post-traumatic stress disorder)  F43.10       History of Present Illness:  Heidi Holland 48 year old female who reports she was recently hospitalized, behavioral health for suicidal ideations with a plan to overdose.  She reports history related to chronic pain and became overwhelmed due to pain stressors.  Currently she is denying suicidal or homicidal ideations.  Denies auditory or  visual hallucinations.  She reports she is currently followed by [facility] for medication management and therapy services, currently prescribed Wellbutrin  150 mg daily, Seroquel  200 mg p.o. twice daily, Lyrica  200 mg p.o. twice daily Ambien  10 mg p.o. nightly and Caplyta  3 mgs daily.   Reported medical diagnoses related to diabetes, hypertension, nerve pain, hip replacement and fibromyalgia.  Patient validates the information provided and below assessment.  Patient to start partial hospitalization outpatient programming 11/12/2023  Per inpatient admission assessment note:The patient is a 48 y/o female with a prior diagnosis of BPAD who was admitted for suicidal ideation with thoughts of overdosing in the context of multiple psychosocial stressors. She explains that she has been suffering a long string of multiple stressors for years that have been progressively taxing and recently have overwhelmed her. She states that while she has long experienced symptoms of depression and anxiety starting around 2009 she experienced multiple losses and subsequent hardships. She reports losing eight people close to her between 2009 and 2014, ending with her mother, who died in her home  from lung cancer while on hospice. 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 She has been unable to pay her bills and is concerned that she will be facing eviction.  Heidi Holland is sitting, pleasant mood is congruent; she is alert/oriented x 4; calm/cooperative; and mood congruent with affect.  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  Reports her sister is supportive and has plans to move in with her sister due to financial concerns.    Patient is speaking in a clear tone at moderate volume, and normal pace; with good eye contact. Her thought process is coherent and relevant; There is no indication that she is currently responding to internal/external stimuli or experiencing delusional thought content.  Patient denies suicidal/self-harm/homicidal ideation, psychosis, and paranoia.  Patient has remained calm throughout assessment and has answered questions appropriately.   Associated Signs/Symptoms: Depression Symptoms:  depressed mood, difficulty concentrating, anxiety, (Hypo) Manic Symptoms:  Distractibility, Anxiety Symptoms:  Excessive Worry, Psychotic Symptoms:  Hallucinations: None PTSD Symptoms: NA  Past Psychiatric History: Previous inpatient admissions.  Carries a diagnosis related to major depressive disorder, generalized anxiety disorder bipolar affective disorder and struggling with grief and loss.  Previous Psychotropic Medications: Yes   Substance Abuse History in the last 12 months:  No.  Consequences of Substance Abuse: NA  Past Medical History:  Past Medical History:  Diagnosis Date   Abnormal Pap smear of cervix    Allergy  Anxiety    Asthma    Bipolar 1 disorder (HCC)    Chronic pain     Depression    Diabetes mellitus without complication (HCC)    Diabetes mellitus, type II (HCC)    Diverticulitis    GERD (gastroesophageal reflux disease)    HPV in female    Hyperlipidemia    Hypertension    Migraines    Osteoarthritis    Ovarian cyst    right   PCOS (polycystic ovarian syndrome)    STD (sexually transmitted disease)    Trichomonas contact, treated    Vertigo     Past Surgical History:  Procedure Laterality Date   BOWEL RESECTION     gasticbypass     GASTRIC BYPASS  2016   KNEE ARTHROSCOPY Bilateral    SHOULDER SURGERY Left    TOTAL HIP ARTHROPLASTY Left     Family Psychiatric History: Reported brother completed suicide by overdose  Family History:  Family History  Problem Relation Age of Onset   Stroke Mother    Heart disease Mother    Lung cancer Mother    Rheum arthritis Mother    Arthritis Mother    COPD Mother    Fibromyalgia Mother    Hypertension Father    Diabetes Father    Heart disease Father    Hodgkin's lymphoma Father    Drug abuse Sister    Anxiety disorder Sister    Endometriosis Sister    Drug abuse Brother        died of overdose   Breast cancer Maternal Aunt    Breast cancer Paternal Aunt    Diabetes Maternal Grandmother    Lung cancer Maternal Grandmother    Other Maternal Grandmother        Visual merchandiser   Skin cancer Paternal Grandmother     Social History:   Social History   Socioeconomic History   Marital status: Single    Spouse name: Not on file   Number of children: 0   Years of education: 14   Highest education level: Associate degree: academic program  Occupational History    Employer: [employer]  Tobacco Use   Smoking status: Never   Smokeless tobacco: Never  Vaping Use   Vaping status: Never Used  Substance and Sexual Activity   Alcohol use: Not Currently   Drug use: Yes    Types: Marijuana    Comment: occ   Sexual activity: Not Currently    Partners: Male    Birth control/protection: Abstinence   Other Topics Concern   Not on file  Social History Narrative   Right handed.    Social Drivers of Health   Financial Resource Strain: High Risk (06/27/2023)   Overall Financial Resource Strain (CARDIA)    Difficulty of Paying Living Expenses: Very hard  Food Insecurity: Food Insecurity Present (10/18/2023)   Hunger Vital Sign    Worried About Running Out of Food in the Last Year: Often true    Ran Out of Food in the Last Year: Often true  Transportation Needs: Unmet Transportation Needs (10/18/2023)   PRAPARE - Administrator, Civil Service (Medical): Yes    Lack of Transportation (Non-Medical): Yes  Physical Activity: Unknown (06/27/2023)   Exercise Vital Sign    Days of Exercise per Week: 0 days    Minutes of Exercise per Session: Not on file  Stress: Stress Concern Present (06/27/2023)   [employer] of Occupational Health - Occupational Stress Questionnaire  Feeling of Stress : Rather much  Social Connections: Socially Isolated (06/27/2023)   Social Connection and Isolation Panel    Frequency of Communication with Friends and Family: Three times a week    Frequency of Social Gatherings with Friends and Family: Once a week    Attends Religious Services: Never    Database administrator or Organizations: No    Attends Engineer, structural: Not on file    Marital Status: Never married    Additional Social History:   Allergies:   Allergies  Allergen Reactions   Ciprofloxacin Itching   Wound Dressing Adhesive Other (See Comments)    Red skin, eats into skin, becomes bubbly    Metabolic Disorder Labs: Lab Results  Component Value Date   HGBA1C 5.3 10/21/2023   MPG 105.41 10/21/2023   MPG 163 12/15/2019   No results found for: PROLACTIN Lab Results  Component Value Date   CHOL 113 10/21/2023   TRIG 185 (H) 10/21/2023   HDL 38 (L) 10/21/2023   CHOLHDL 3.0 10/21/2023   VLDL 37 10/21/2023   LDLCALC 38 10/21/2023   LDLCALC 69 06/27/2023    Lab Results  Component Value Date   TSH 1.755 10/17/2023    Therapeutic Level Labs: Lab Results  Component Value Date   LITHIUM  1.2 04/19/2020   No results found for: CBMZ No results found for: VALPROATE  Current Medications: Current Outpatient Medications  Medication Sig Dispense Refill   botulinum toxin Type A  (BOTOX ) 200 units injection Inject 155 units IM into multiple site in the face,neck and head once every 90 days 1 each 4   buPROPion  (WELLBUTRIN  XL) 150 MG 24 hr tablet Take 1 tablet (150 mg total) by mouth daily for 14 days. 14 tablet 0   clonazePAM  (KLONOPIN ) 0.5 MG tablet Take 0.5 mg by mouth 2 (two) times daily.     HYDROcodone -acetaminophen  (NORCO) 10-325 MG tablet Take 1 tablet by mouth every 6 (six) hours as needed.     losartan  (COZAAR ) 50 MG tablet Take 1 tablet (50 mg total) by mouth daily. 90 tablet 1   methocarbamol  (ROBAXIN ) 750 MG tablet Take 750 mg by mouth 3 (three) times daily as needed.     ondansetron  (ZOFRAN -ODT) 4 MG disintegrating tablet Take 4 mg by mouth every 6 (six) hours as needed.     pantoprazole  (PROTONIX ) 40 MG tablet Take 40 mg by mouth daily.     pregabalin  (LYRICA ) 200 MG capsule Take 1 capsule (200 mg total) by mouth 2 (two) times daily for 7 days. 14 capsule 0   QUEtiapine  (SEROQUEL ) 200 MG tablet Take 200 mg by mouth 2 (two) times daily.     rosuvastatin  (CRESTOR ) 5 MG tablet Take 5 mg by mouth daily.     SUMAtriptan  (IMITREX ) 100 MG tablet Take 100 mg by mouth every 2 (two) hours as needed for migraine.     tirzepatide  (MOUNJARO ) 12.5 MG/0.5ML Pen Inject 12.5 mg into the skin once a week. 6 mL 0   zolpidem  (AMBIEN ) 10 MG tablet Take 10 mg by mouth at bedtime.     No current facility-administered medications for this visit.    Musculoskeletal: Strength & Muscle Tone: within normal limits Gait & Station: normal Patient leans: N/A  Psychiatric Specialty Exam: Review of Systems  Last menstrual period 09/22/2023.There is no  height or weight on file to calculate BMI.  General Appearance: Casual  Eye Contact:  Good  Speech:  Clear and Coherent  Volume:  Normal  Mood:  Anxious and Depressed  Affect:  Congruent  Thought Process:  Coherent  Orientation:  Full (Time, Place, and Person)  Thought Content:  Logical  Suicidal Thoughts:  No  Homicidal Thoughts:  No  Memory:  Immediate;   Good Recent;   Good  Judgement:  Good  Insight:  Good  Psychomotor Activity:  Normal  Concentration:  Concentration: Good  Recall:  Good  Fund of Knowledge:Good  Language: Good  Akathisia:  No  Handed:  Right  AIMS (if indicated):  not done  Assets:  Communication Skills Desire for Improvement  ADL's:  Intact  Cognition: WNL  Sleep:  Fair   Screenings: AUDIT    Flowsheet Row [facility]  Alcohol Use Disorder Identification Test Final Score (AUDIT) 0   GAD-7    Flowsheet Row Counselor from 11/11/2023 in [facility] Visit from 10/16/2023 in [facility] Office Visit from 08/24/2022 in [facility] [place] Procedure visit from 03/01/2021 in [facility] Office Visit from 01/19/2021 in [facility]  Total GAD-7 Score 15 16 12 10 7    [employer]    Flowsheet Row Counselor from 11/11/2023 in [facility] Counselor from 10/30/2023 in [facility] Office Visit from 10[facility]16/2023 in [facility] Office Visit from 06/27/2023 in [facility] [place] [facility] [employer] from 02/20/2023 in [facility] [place]  PHQ-2 Total Score 6 4 3  0 2  PHQ-9 Total Score 21 16 10  0 13   Flowsheet Row Counselor from  10/30/2023 in [facility] Admission (Discharged) [facility] from 10/17/2023 in [facility]  C-SSRS RISK CATEGORY High Risk High Risk High Risk    Assessment and Plan:  patient enrolled in Partial Hospitalization Program, patient's current medications are to be continued, the following medications are being prescribed-consideration for initiating Cymbalta comprehensive treatment plan will be developed and side effects of medications have been reviewed with patient  Treatment options and alternatives reviewed with patient and patient understands the above plan. Treatment plan was reviewed and agreed upon by NP T.Ezzard and patient Muna Demers need for group services.   Collaboration of Care: Medication Management AEB continue medications as directed consideration for initiating Cymbalta due to reported lingering depression and pain management  Patient/Guardian was advised Release of Information must be obtained prior to any record release in order to collaborate their care with an outside provider. Patient/Guardian was advised if they have not already done so to contact the registration department to sign all necessary forms in order for us  to release information regarding their care.   Consent: Patient/Guardian gives verbal consent for treatment and assignment of benefits for services provided during this visit. Patient/Guardian expressed understanding and agreed to proceed.   Staci LOISE Ezzard, NP 09/29/20259:35 AM

## 2023-11-16 NOTE — Patient Instructions (Signed)
 Lauraine Riding, thank you for joining Haze LELON Servant, NP for today's virtual visit.  While this provider is not your primary care provider (PCP), if your PCP is located in our provider database this encounter information will be shared with them immediately following your visit.   A [facility] MyChart account gives you access to today's visit and all your visits, tests, and labs performed at [facility]  click here if you don't have a [facility] MyChart account or go to [email or web address]  Consent: (Patient) Heidi Holland provided verbal consent for this virtual visit at the beginning of the encounter.  Current Medications:  Current Outpatient Medications:    amoxicillin -clavulanate (AUGMENTIN ) 875-125 MG tablet, Take 1 tablet by mouth 2 (two) times daily for 7 days., Disp: 14 tablet, Rfl: 0   meclizine  (ANTIVERT ) 25 MG tablet, Take 1 tablet (25 mg total) by mouth 3 (three) times daily as needed for dizziness., Disp: 30 tablet, Rfl: 0   botulinum toxin Type A  (BOTOX ) 200 units injection, Inject 155 units IM into multiple site in the face,neck and head once every 90 days, Disp: 1 each, Rfl: 4   buPROPion  (WELLBUTRIN  XL) 150 MG 24 hr tablet, Take 1 tablet (150 mg total) by mouth daily for 14 days., Disp: 14 tablet, Rfl: 0   clonazePAM  (KLONOPIN ) 0.5 MG tablet, Take 0.5 mg by mouth 2 (two) times daily., Disp: , Rfl:    HYDROcodone -acetaminophen  (NORCO) 10-325 MG tablet, Take 1 tablet by mouth every 6 (six) hours as needed., Disp: , Rfl:    losartan  (COZAAR ) 50 MG tablet, Take 1 tablet (50 mg total) by mouth daily., Disp: 90 tablet, Rfl: 1   methocarbamol  (ROBAXIN ) 750 MG tablet, Take 750 mg by mouth 3 (three) times daily as needed., Disp: , Rfl:    ondansetron  (ZOFRAN -ODT) 4 MG disintegrating tablet, Take 4 mg by mouth every 6 (six) hours as needed., Disp: , Rfl:    pantoprazole  (PROTONIX ) 40 MG tablet, Take 40 mg by mouth daily., Disp: , Rfl:    pregabalin  (LYRICA ) 200 MG  capsule, Take 1 capsule (200 mg total) by mouth 2 (two) times daily for 7 days., Disp: 14 capsule, Rfl: 0   QUEtiapine  (SEROQUEL ) 200 MG tablet, Take 200 mg by mouth 2 (two) times daily., Disp: , Rfl:    rosuvastatin  (CRESTOR ) 5 MG tablet, Take 5 mg by mouth daily., Disp: , Rfl:    SUMAtriptan  (IMITREX ) 100 MG tablet, Take 100 mg by mouth every 2 (two) hours as needed for migraine., Disp: , Rfl:    tirzepatide  (MOUNJARO ) 12.5 MG/0.5ML Pen, Inject 12.5 mg into the skin once a week., Disp: 6 mL, Rfl: 0   zolpidem  (AMBIEN ) 10 MG tablet, Take 10 mg by mouth at bedtime., Disp: , Rfl:    Medications ordered in this encounter:  Meds ordered this encounter  Medications   amoxicillin -clavulanate (AUGMENTIN ) 875-125 MG tablet    Sig: Take 1 tablet by mouth 2 (two) times daily for 7 days.    Dispense:  14 tablet    Refill:  0    Supervising Provider:   LAMPTEY, PHILIP O [[id number]]   meclizine  (ANTIVERT ) 25 MG tablet    Sig: Take 1 tablet (25 mg total) by mouth 3 (three) times daily as needed for dizziness.    Dispense:  30 tablet    Refill:  0    Supervising Provider:   BLAISE ALEENE KIDD [phone]     *If you need refills on other medications prior to  your next appointment, please contact your pharmacy*  Follow-Up: Call back or seek an in-person evaluation if the symptoms worsen or if the condition fails to improve as anticipated.  [facility] [phone]  Other Instructions    If you have been instructed to have an in-person evaluation today at a local Urgent Care facility, please use the link below. It will take you to a list of all of our available [facility], including address, phone number and hours of operation. Please do not delay care.  [facility]  If you or a family member do not have a primary care provider, use the link below to schedule a visit and establish care. When you choose a [facility] primary care physician or advanced practice  provider, you gain a long-term partner in health. Find a Primary Care Provider  Learn more about [facility] in-office and virtual care options: [facility] - Get Care Now

## 2023-11-16 NOTE — Progress Notes (Signed)
 Virtual Visit Consent   Heidi Holland, you are scheduled for a virtual visit with a [facility] provider today. Just as with appointments in the office, your consent must be obtained to participate. Your consent will be active for this visit and any virtual visit you may have with one of our providers in the next 365 days. If you have a MyChart account, a copy of this consent can be sent to you electronically.  As this is a virtual visit, video technology does not allow for your provider to perform a traditional examination. This may limit your provider's ability to fully assess your condition. If your provider identifies any concerns that need to be evaluated in person or the need to arrange testing (such as labs, EKG, etc.), we will make arrangements to do so. Although advances in technology are sophisticated, we cannot ensure that it will always work on either your end or our end. If the connection with a video visit is poor, the visit may have to be switched to a telephone visit. With either a video or telephone visit, we are not always able to ensure that we have a secure connection.  By engaging in this virtual visit, you consent to the provision of healthcare and authorize for your insurance to be billed (if applicable) for the services provided during this visit. Depending on your insurance coverage, you may receive a charge related to this service.  I need to obtain your verbal consent now. Are you willing to proceed with your visit today? Heidi Holland has provided verbal consent on 11/16/2023 for a virtual visit (video or telephone). Haze LELON Servant, NP  Date: 11/16/2023 11:30 AM   Virtual Visit via Video Note   I, Haze LELON Servant, connected with  Heidi Holland  ([id number], May 18, 1975) on 11/16/23 at 11:15 AM EDT by a video-enabled telemedicine application and verified that I am speaking with the correct person using two identifiers.  Location: Patient: Virtual Visit Location Patient:  Home Provider: Virtual Visit Location Provider: Home Office   I discussed the limitations of evaluation and management by telemedicine and the availability of in person appointments. The patient expressed understanding and agreed to proceed.    History of Present Illness: Heidi Holland is a 48 y.o. who identifies as a female who was assigned female at birth, and is being seen today for Sinus infection .  Over the past week Ms. Burklow has been experiencing symptoms of frontal and maxillary sinus pressure, bilateral ear fullness, vertigo and pain behind her eyes.  Despite taking Zyrtec , Mucinex  and prescription nasal spray her symptoms have persisted.    Problems:  Patient Active Problem List   Diagnosis Date Noted   PTSD (post-traumatic stress disorder) 10/30/2023   Generalized anxiety disorder 10/19/2023   Mood disorder 10/19/2023   Bipolar 1 disorder, mixed, severe (HCC) 10/18/2023   Meralgia paresthetica of left side 07/15/2023   Fibromyalgia 06/27/2023   Ovarian cyst 02/20/2023   Essential hypertension 02/20/2023   Bipolar 1 disorder, depressed (HCC) 02/16/2022   Paresthesia 09/13/2021   CIN I (cervical intraepithelial neoplasia I) 03/03/2021   LSIL pap smear of cervix/human papillomavirus (HPV) positive 10/24/2020   Family history of early CAD 10/24/2020   B12 deficiency 07/26/2020   Somatic dysfunction of spine, cervical 07/21/2020   Status post left hip replacement 03/12/2019   T2DM (type 2 diabetes mellitus) (HCC) 07/31/2018   Dyslipidemia associated with type 2 diabetes mellitus (HCC) 07/31/2018   Allergic rhinitis 07/31/2018   Mild intermittent asthma,  uncomplicated 07/31/2018   GERD (gastroesophageal reflux disease) 07/31/2018   S/P gastric bypass 07/31/2018   Migraine 07/31/2018   Anxiety 07/31/2018   Depression, major, single episode, complete remission 07/31/2018   Insomnia 07/31/2018   Umbilical hernia 07/31/2018   Iron deficiency anemia 06/09/2018   Cervicogenic  headache 05/12/2018   Low back pain 05/12/2018   Polyarthralgia 05/12/2018    Allergies:  Allergies  Allergen Reactions   Ciprofloxacin Itching   Wound Dressing Adhesive Other (See Comments)    Red skin, eats into skin, becomes bubbly   Medications:  Current Outpatient Medications:    amoxicillin -clavulanate (AUGMENTIN ) 875-125 MG tablet, Take 1 tablet by mouth 2 (two) times daily for 7 days., Disp: 14 tablet, Rfl: 0   meclizine  (ANTIVERT ) 25 MG tablet, Take 1 tablet (25 mg total) by mouth 3 (three) times daily as needed for dizziness., Disp: 30 tablet, Rfl: 0   botulinum toxin Type A  (BOTOX ) 200 units injection, Inject 155 units IM into multiple site in the face,neck and head once every 90 days, Disp: 1 each, Rfl: 4   buPROPion  (WELLBUTRIN  XL) 150 MG 24 hr tablet, Take 1 tablet (150 mg total) by mouth daily for 14 days., Disp: 14 tablet, Rfl: 0   clonazePAM  (KLONOPIN ) 0.5 MG tablet, Take 0.5 mg by mouth 2 (two) times daily., Disp: , Rfl:    HYDROcodone -acetaminophen  (NORCO) 10-325 MG tablet, Take 1 tablet by mouth every 6 (six) hours as needed., Disp: , Rfl:    losartan  (COZAAR ) 50 MG tablet, Take 1 tablet (50 mg total) by mouth daily., Disp: 90 tablet, Rfl: 1   methocarbamol  (ROBAXIN ) 750 MG tablet, Take 750 mg by mouth 3 (three) times daily as needed., Disp: , Rfl:    ondansetron  (ZOFRAN -ODT) 4 MG disintegrating tablet, Take 4 mg by mouth every 6 (six) hours as needed., Disp: , Rfl:    pantoprazole  (PROTONIX ) 40 MG tablet, Take 40 mg by mouth daily., Disp: , Rfl:    pregabalin  (LYRICA ) 200 MG capsule, Take 1 capsule (200 mg total) by mouth 2 (two) times daily for 7 days., Disp: 14 capsule, Rfl: 0   QUEtiapine  (SEROQUEL ) 200 MG tablet, Take 200 mg by mouth 2 (two) times daily., Disp: , Rfl:    rosuvastatin  (CRESTOR ) 5 MG tablet, Take 5 mg by mouth daily., Disp: , Rfl:    SUMAtriptan  (IMITREX ) 100 MG tablet, Take 100 mg by mouth every 2 (two) hours as needed for migraine., Disp: , Rfl:     tirzepatide  (MOUNJARO ) 12.5 MG/0.5ML Pen, Inject 12.5 mg into the skin once a week., Disp: 6 mL, Rfl: 0   zolpidem  (AMBIEN ) 10 MG tablet, Take 10 mg by mouth at bedtime., Disp: , Rfl:   Observations/Objective: Patient is well-developed, well-nourished in no acute distress.  Resting comfortably at home.  Head is normocephalic, atraumatic.  No labored breathing.  Speech is clear and coherent with logical content.  Patient is alert and oriented at baseline.    Assessment and Plan: 1. Acute bacterial sinusitis (Primary) - amoxicillin -clavulanate (AUGMENTIN ) 875-125 MG tablet; Take 1 tablet by mouth 2 (two) times daily for 7 days.  Dispense: 14 tablet; Refill: 0 - meclizine  (ANTIVERT ) 25 MG tablet; Take 1 tablet (25 mg total) by mouth 3 (three) times daily as needed for dizziness.  Dispense: 30 tablet; Refill: 0    Follow Up Instructions: I discussed the assessment and treatment plan with the patient. The patient was provided an opportunity to ask questions and all were answered. The patient  agreed with the plan and demonstrated an understanding of the instructions.  A copy of instructions were sent to the patient via MyChart unless otherwise noted below.    The patient was advised to call back or seek an in-person evaluation if the symptoms worsen or if the condition fails to improve as anticipated.    Jerianne Anselmo W Taylen Wendland, NP

## 2023-11-18 ENCOUNTER — Other Ambulatory Visit: Payer: Self-pay

## 2023-11-18 ENCOUNTER — Ambulatory Visit (HOSPITAL_COMMUNITY)

## 2023-11-18 ENCOUNTER — Ambulatory Visit: Admitting: Physical Therapy

## 2023-11-18 ENCOUNTER — Telehealth: Payer: Self-pay

## 2023-11-18 ENCOUNTER — Other Ambulatory Visit

## 2023-11-18 DIAGNOSIS — F319 Bipolar disorder, unspecified: Secondary | ICD-10-CM

## 2023-11-19 ENCOUNTER — Ambulatory Visit: Payer: Self-pay | Admitting: Rehabilitation

## 2023-11-19 ENCOUNTER — Inpatient Hospital Stay: Admitting: Oncology

## 2023-11-19 ENCOUNTER — Ambulatory Visit: Attending: Neurology | Admitting: Rehabilitation

## 2023-11-19 ENCOUNTER — Ambulatory Visit: Payer: Self-pay | Admitting: Physical Therapy

## 2023-11-19 ENCOUNTER — Encounter: Payer: Self-pay | Admitting: Oncology

## 2023-11-19 ENCOUNTER — Inpatient Hospital Stay: Attending: Oncology

## 2023-11-19 ENCOUNTER — Ambulatory Visit (HOSPITAL_COMMUNITY)

## 2023-11-19 VITALS — BP 122/77 | HR 82 | Temp 97.7°F | Resp 18 | Ht 62.0 in | Wt 181.0 lb

## 2023-11-19 DIAGNOSIS — Z9884 Bariatric surgery status: Secondary | ICD-10-CM | POA: Insufficient documentation

## 2023-11-19 DIAGNOSIS — E538 Deficiency of other specified B group vitamins: Secondary | ICD-10-CM | POA: Diagnosis not present

## 2023-11-19 DIAGNOSIS — D509 Iron deficiency anemia, unspecified: Secondary | ICD-10-CM

## 2023-11-19 DIAGNOSIS — M5442 Lumbago with sciatica, left side: Secondary | ICD-10-CM | POA: Diagnosis not present

## 2023-11-19 DIAGNOSIS — G8929 Other chronic pain: Secondary | ICD-10-CM | POA: Diagnosis not present

## 2023-11-19 DIAGNOSIS — Z87898 Personal history of other specified conditions: Secondary | ICD-10-CM | POA: Diagnosis not present

## 2023-11-19 LAB — CBC WITH DIFFERENTIAL (CANCER CENTER ONLY)
Abs Immature Granulocytes: 0.03 K/uL (ref 0.00–0.07)
Basophils Absolute: 0 K/uL (ref 0.0–0.1)
Basophils Relative: 0 %
Eosinophils Absolute: 0.2 K/uL (ref 0.0–0.5)
Eosinophils Relative: 3 %
HCT: 35.7 % — ABNORMAL LOW (ref 36.0–46.0)
Hemoglobin: 12 g/dL (ref 12.0–15.0)
Immature Granulocytes: 0 %
Lymphocytes Relative: 23 %
Lymphs Abs: 1.8 K/uL (ref 0.7–4.0)
MCH: 29.6 pg (ref 26.0–34.0)
MCHC: 33.6 g/dL (ref 30.0–36.0)
MCV: 88.1 fL (ref 80.0–100.0)
Monocytes Absolute: 0.7 K/uL (ref 0.1–1.0)
Monocytes Relative: 8 %
Neutro Abs: 5.1 K/uL (ref 1.7–7.7)
Neutrophils Relative %: 66 %
Platelet Count: 228 K/uL (ref 150–400)
RBC: 4.05 MIL/uL (ref 3.87–5.11)
RDW: 15 % (ref 11.5–15.5)
WBC Count: 7.9 K/uL (ref 4.0–10.5)
nRBC: 0 % (ref 0.0–0.2)

## 2023-11-19 LAB — IRON AND TIBC
Iron: 16 ug/dL — ABNORMAL LOW (ref 28–170)
Saturation Ratios: 6 % — ABNORMAL LOW (ref 10.4–31.8)
TIBC: 287 ug/dL (ref 250–450)
UIBC: 271 ug/dL

## 2023-11-19 LAB — FERRITIN: Ferritin: 82 ng/mL (ref 11–307)

## 2023-11-19 NOTE — Assessment & Plan Note (Addendum)
 Chronic iron deficiency anemia likely secondary to malabsorption post-gastric bypass surgery in 2016.   Reports significant fatigue, cognitive impairment, and musculoskeletal pain. No evidence of gastrointestinal bleeding or other sources of blood loss. Previous iron infusion two years ago. Current iron levels are low despite normal B12 and folic acid  levels.  Oral iron supplements not well tolerated and absorption is questionable. No history of blood transfusion, chest pain, dyspnea, or pica.  On her consultation with us  on 07/22/2023, labs showed persistent anemia with hemoglobin of 9.9, MCV 74.2. Ferritin decreased at 6.  Given persistent iron deficiency anemia, we will proceed with IV iron using Feraheme  x 2 doses in July 2025.  Iron deficiency is presumed from gastric bypass history and menstrual blood loss.  Labs today show normal hemoglobin of 12, MCV normal at 88.  White count and platelet count are within normal limits.  Ferritin normal at 82.  Iron studies pending.   Currently no indication for IV iron given normal hemoglobin and normal ferritin.  - Schedule follow-up appointment in four months to reassess iron levels and symptoms.

## 2023-11-19 NOTE — Assessment & Plan Note (Signed)
 Screening mammogram on 10/30/2023 showed possible mass in the right breast, BI-RADS 0.  She had follow-up ultrasound of the right breast on 11/05/2023 which showed benign findings.  BI-RADS 2.  Simple cyst noted in the right breast.  Continue annual mammogram for screening for breast cancer.

## 2023-11-19 NOTE — Progress Notes (Signed)
 [facility]  HEMATOLOGY CLINIC PROGRESS NOTE  PATIENT NAME: Heidi Holland   MR#: [id number] DOB: 1975-05-05  Patient Care Team: Kennyth Worth HERO, MD as PCP - General (Family Medicine) Skeet Juliene SAUNDERS, DO as Consulting Physician (Neurology)  Date of visit: 11/19/2023   ASSESSMENT & PLAN:   Heidi Holland is a 48 y.o. lady with a past medical history of Gastric bypass in 2016, GERD, migraines, bipolar disorder, diabetes mellitus, hypertension, PCOS, diverticulosis, was referred to our service in June 2025 for evaluation of iron deficiency anemia.  Iron deficiency was presumed to be from her history of gastric bypass.  She was treated with IV iron using Feraheme  x 2 doses in July 2025.    Iron deficiency anemia Chronic iron deficiency anemia likely secondary to malabsorption post-gastric bypass surgery in 2016.   Reports significant fatigue, cognitive impairment, and musculoskeletal pain. No evidence of gastrointestinal bleeding or other sources of blood loss. Previous iron infusion two years ago. Current iron levels are low despite normal B12 and folic acid  levels.  Oral iron supplements not well tolerated and absorption is questionable. No history of blood transfusion, chest pain, dyspnea, or pica.  On her consultation with us  on 07/22/2023, labs showed persistent anemia with hemoglobin of 9.9, MCV 74.2. Ferritin decreased at 6.  Given persistent iron deficiency anemia, we will proceed with IV iron using Feraheme  x 2 doses in July 2025.  Iron deficiency is presumed from gastric bypass history and menstrual blood loss.  Labs today show normal hemoglobin of 12, MCV normal at 88.  White count and platelet count are within normal limits.  Ferritin normal at 82.  Iron studies pending.   Currently no indication for IV iron given normal hemoglobin and normal ferritin.  - Schedule follow-up appointment in four months to reassess iron levels and symptoms.  History of abnormal  mammogram Screening mammogram on 10/30/2023 showed possible mass in the right breast, BI-RADS 0.  She had follow-up ultrasound of the right breast on 11/05/2023 which showed benign findings.  BI-RADS 2.  Simple cyst noted in the right breast.  Continue annual mammogram for screening for breast cancer.   I spent a total of 27 minutes during this encounter with the patient including review of chart and various tests results, discussions about plan of care and coordination of care plan.  I reviewed lab results and outside records for this visit and discussed relevant results with the patient. Diagnosis, plan of care and treatment options were also discussed in detail with the patient. Opportunity provided to ask questions and answers provided to her apparent satisfaction. Provided instructions to call our clinic with any problems, questions or concerns prior to return visit. I recommended to continue follow-up with PCP and sub-specialists. She verbalized understanding and agreed with the plan. No barriers to learning was detected.  Chinita Patten, MD  11/19/2023 5:43 PM  [facility] - A DEPT OF [facility] [address] [place] [address] Dept: [phone] Dept Fax: [phone]   CHIEF COMPLAINT/ REASON FOR VISIT:  Follow-up for iron deficiency anemia, related to her history of gastric bypass.  INTERVAL HISTORY:  Discussed the use of AI scribe software for clinical note transcription with the patient, who gave verbal consent to proceed.  History of Present Illness Heidi Holland is a 48 year old female with iron deficiency anemia who presents for follow-up after IV iron treatment.  She was hospitalized in September  for behavioral health issues and received two doses of IV iron due to a previous hemoglobin level of 9.9 and a ferritin level of 6. Despite the treatment, she did not notice a difference in her symptoms.  Her current ferritin level is 82, and her hemoglobin is 12. She could not tolerate oral iron in the past.  She experiences generalized pain and is currently on antibiotics. Her vitamin B12 and folic acid  levels were normal previously, and she does not take vitamin supplements. She has a history of low vitamin D  but is not currently taking supplements due to cost.  She lives in a place with a lot of mold and has been feeling sick for the past year to two years, with fibromyalgia being the best diagnosis she has received. She has diverticulosis but reports no recent issues. She has not gone through menopause and is currently menstruating, with her periods being light to regular, though today it is very heavy.  No chest pain or trouble breathing.   SUMMARY OF HEMATOLOGIC HISTORY:  She was referred by Dr. Kennyth for evaluation of her anemia.   She has experienced anemia since her gastric bypass surgery in 2016. On 06/27/2023, labs at her PCPs office showed hemoglobin of 11.3, hematocrit 35.1, MCV 70.9.  White count and platelet count were within normal limits.  Iron studies showed evidence of severe iron deficiency with ferritin of 4, iron saturation of 4%, iron decreased at 18, iron binding capacity increased at 494.  CMP unremarkable.  Vitamin B12, folate, CRP, TSH were all within normal limits.  She was referred to us  for further evaluation and management of iron deficiency anemia.   Last colonoscopy in April 2024 showed evidence of diverticulosis and 1 benign polyp.  EGD at the same time showed LA grade A esophagitis.  No other major abnormalities.   She received an iron infusion approximately two years ago and another one recently. Despite these treatments, she continues to experience significant fatigue, brain fog, and persistent joint and muscle pain.   She has a history of intolerance to oral iron supplements. Recent blood work indicated low iron levels, although her B12 and folic acid  levels  were normal a month ago.   No obvious sources of blood loss, such as gastrointestinal bleeding, epistaxis, or gum bleeding. Her menstrual cycles are not heavy, and she has not experienced any unusual cravings for ice or non-food substances.   On her consultation with us  on 07/22/2023, labs showed persistent anemia with hemoglobin of 9.9, MCV 74.2. Ferritin decreased at 6.  Given persistent iron deficiency anemia, we will proceed with IV iron using Feraheme  x 2 doses in July 2025.  Iron deficiency is presumed from gastric bypass history and menstrual blood loss.  I have reviewed the past medical history, past surgical history, social history and family history with the patient and they are unchanged from previous note.  ALLERGIES: She is allergic to ciprofloxacin and wound dressing adhesive.  MEDICATIONS:  Current Outpatient Medications  Medication Sig Dispense Refill   amoxicillin -clavulanate (AUGMENTIN ) 875-125 MG tablet Take 1 tablet by mouth 2 (two) times daily for 7 days. 14 tablet 0   botulinum toxin Type A  (BOTOX ) 200 units injection Inject 155 units IM into multiple site in the face,neck and head once every 90 days 1 each 4   buPROPion  (WELLBUTRIN  XL) 150 MG 24 hr tablet Take 1 tablet (150 mg total) by mouth daily for 14 days. 14 tablet 0   clonazePAM  (KLONOPIN ) 0.5 MG  tablet Take 0.5 mg by mouth 2 (two) times daily.     fluconazole  (DIFLUCAN ) 150 MG tablet Take 1 tablet (150 mg total) by mouth every three (3) days as needed. 2 tablet 0   HYDROcodone -acetaminophen  (NORCO) 10-325 MG tablet Take 1 tablet by mouth every 6 (six) hours as needed.     losartan  (COZAAR ) 50 MG tablet Take 1 tablet (50 mg total) by mouth daily. 90 tablet 1   meclizine  (ANTIVERT ) 25 MG tablet Take 1 tablet (25 mg total) by mouth 3 (three) times daily as needed for dizziness. 30 tablet 0   methocarbamol  (ROBAXIN ) 750 MG tablet Take 750 mg by mouth 3 (three) times daily as needed.     ondansetron  (ZOFRAN -ODT) 4 MG  disintegrating tablet Take 4 mg by mouth every 6 (six) hours as needed.     pantoprazole  (PROTONIX ) 40 MG tablet Take 40 mg by mouth daily.     pregabalin  (LYRICA ) 200 MG capsule Take 1 capsule (200 mg total) by mouth 2 (two) times daily for 7 days. 14 capsule 0   QUEtiapine  (SEROQUEL ) 200 MG tablet Take 200 mg by mouth 2 (two) times daily.     rosuvastatin  (CRESTOR ) 5 MG tablet Take 5 mg by mouth daily.     SUMAtriptan  (IMITREX ) 100 MG tablet Take 100 mg by mouth every 2 (two) hours as needed for migraine.     tirzepatide  (MOUNJARO ) 12.5 MG/0.5ML Pen Inject 12.5 mg into the skin once a week. 6 mL 0   zolpidem  (AMBIEN ) 10 MG tablet Take 10 mg by mouth at bedtime.     No current facility-administered medications for this visit.     REVIEW OF SYSTEMS:    Review of Systems - Oncology  All other pertinent systems were reviewed with the patient and are negative.  PHYSICAL EXAMINATION:    Onc Performance Status - 11/19/23 0953       ECOG Perf Status   ECOG Perf Status Capable of only limited selfcare, confined to bed or chair more than 50% of waking hours      KPS SCALE   KPS % SCORE Requires occasional assistance but is able to care for most needs          Vitals:   11/19/23 0944  BP: 122/77  Pulse: 82  Resp: 18  Temp: 97.7 F (36.5 C)  SpO2: 100%   Filed Weights   11/19/23 0944  Weight: 181 lb (82.1 kg)    Physical Exam Constitutional:      General: She is not in acute distress.    Appearance: Normal appearance.  HENT:     Head: Normocephalic and atraumatic.  Cardiovascular:     Rate and Rhythm: Normal rate.  Pulmonary:     Effort: Pulmonary effort is normal. No respiratory distress.  Abdominal:     General: There is no distension.  Neurological:     General: No focal deficit present.     Mental Status: She is alert and oriented to person, place, and time.  Psychiatric:        Mood and Affect: Mood normal.        Behavior: Behavior normal.      LABORATORY DATA:   I have reviewed the data as listed.  Results for orders placed or performed in visit on 11/19/23  Ferritin  Result Value Ref Range   Ferritin 82 11 - 307 ng/mL  Iron and TIBC  Result Value Ref Range   Iron 16 (L) 28 -  170 ug/dL   TIBC 712 749 - 549 ug/dL   Saturation Ratios 6 (L) 10.4 - 31.8 %   UIBC 271 ug/dL  CBC with Differential (Cancer Center Only)  Result Value Ref Range   WBC Count 7.9 4.0 - 10.5 K/uL   RBC 4.05 3.87 - 5.11 MIL/uL   Hemoglobin 12.0 12.0 - 15.0 g/dL   HCT 64.2 (L) 63.9 - 53.9 %   MCV 88.1 80.0 - 100.0 fL   MCH 29.6 26.0 - 34.0 pg   MCHC 33.6 30.0 - 36.0 g/dL   RDW 84.9 88.4 - 84.4 %   Platelet Count 228 150 - 400 K/uL   nRBC 0.0 0.0 - 0.2 %   Neutrophils Relative % 66 %   Neutro Abs 5.1 1.7 - 7.7 K/uL   Lymphocytes Relative 23 %   Lymphs Abs 1.8 0.7 - 4.0 K/uL   Monocytes Relative 8 %   Monocytes Absolute 0.7 0.1 - 1.0 K/uL   Eosinophils Relative 3 %   Eosinophils Absolute 0.2 0.0 - 0.5 K/uL   Basophils Relative 0 %   Basophils Absolute 0.0 0.0 - 0.1 K/uL   Immature Granulocytes 0 %   Abs Immature Granulocytes 0.03 0.00 - 0.07 K/uL     RADIOGRAPHIC STUDIES:  I have personally reviewed the radiological images as listed and agree with the findings in the report.  US  LIMITED ULTRASOUND INCLUDING AXILLA RIGHT BREAST Result Date: 11/05/2023 CLINICAL DATA:  48 year old female recalled for a possible right breast mass. EXAM: ULTRASOUND OF THE RIGHT BREAST COMPARISON:  Mammogram dated 10/28/2023. FINDINGS: Targeted ultrasound at about the 9 o'clock position, approximately 3 cm from nipple, demonstrates a simple cyst measuring 7 x 7 x 5 mm. This correlates favorably with the findings seen on the comparison mammogram. IMPRESSION: Benign findings as above. RECOMMENDATION: Return to annual screening mammograms. I have discussed the findings and recommendations with the patient. If applicable, a reminder letter will be sent to the  patient regarding the next appointment. BI-RADS CATEGORY  2: Benign. Electronically Signed   By: Curtistine Noble   On: 11/05/2023 13:27   MM 3D SCREENING MAMMOGRAM BILATERAL BREAST Result Date: 10/30/2023 CLINICAL DATA:  Screening. EXAM: DIGITAL SCREENING BILATERAL MAMMOGRAM WITH TOMOSYNTHESIS AND CAD TECHNIQUE: Bilateral screening digital craniocaudal and mediolateral oblique mammograms were obtained. Bilateral screening digital breast tomosynthesis was performed. The images were evaluated with computer-aided detection. COMPARISON:  None.  Baseline study. ACR Breast Density Category b: There are scattered areas of fibroglandular density. FINDINGS: In the right breast, a possible mass warrants further evaluation. In the left breast, no findings suspicious for malignancy. IMPRESSION: Further evaluation is suggested for possible mass in the right breast. RECOMMENDATION: Ultrasound of the right breast. (Code:US -R-51M) The patient will be contacted regarding the findings, and additional imaging will be scheduled. BI-RADS CATEGORY  0: Incomplete: Need additional imaging evaluation. Electronically Signed   By: Alm Parkins M.D.   On: 10/30/2023 07:34    Orders Placed This Encounter  Procedures   CBC with Differential (Cancer Center Only)    Standing Status:   Future    Expected Date:   03/21/2024    Expiration Date:   06/19/2024   Iron and TIBC    Standing Status:   Future    Expected Date:   03/21/2024    Expiration Date:   06/19/2024   Ferritin    Standing Status:   Future    Expected Date:   03/21/2024    Expiration Date:  06/19/2024   Vitamin B12    Standing Status:   Future    Expected Date:   03/21/2024    Expiration Date:   06/19/2024   Folate    Standing Status:   Future    Expected Date:   03/21/2024    Expiration Date:   06/19/2024   Vitamin D  25 hydroxy    Standing Status:   Future    Expected Date:   03/21/2024    Expiration Date:   06/19/2024     Future Appointments  Date Time Provider  Department Center  11/20/2023  9:00 AM [facility] THERAPIST [facility] None  11/21/2023  9:00 AM [facility] THERAPIST [facility] None  11/22/2023  9:00 AM [facility] THERAPIST [facility] None  11/22/2023  1:10 PM Jaffe, Adam R, DO [facility] None  11/25/2023  9:00 AM [facility] THERAPIST [facility] None  11/25/2023  2:45 PM Roxanna Area, PT [facility]  11/26/2023  9:00 AM [facility] THERAPIST [facility] None  11/26/2023  1:35 PM Izell Harari, MD [facility] [place]  11/26/2023  2:45 PM Joane Artist RAMAN, MD [facility] None  11/27/2023  9:00 AM [facility] THERAPIST [facility] None  11/28/2023  9:00 AM [facility] THERAPIST [facility] None  11/28/2023  2:45 PM Dilday, Area, PT [facility]  11/29/2023  9:00 AM [facility] THERAPIST [facility] None  12/04/2023  2:10 PM Skeet Juliene SAUNDERS, DO [facility] None  12/05/2023 10:00 AM [facility] 2 [facility]  12/05/2023 11:00 AM MC-MR 2 [facility]  12/11/2023  1:30 PM [facility] LAB HELIX [facility] None  12/12/2023  9:30 AM Roxanna Area, PT [facility]  03/04/2024  9:30 AM Skeet Juliene SAUNDERS, DO [facility] None  03/17/2024  9:15 AM [facility] PHLEBOTOMIST [facility] None  03/17/2024  9:45 AM Shawonda Kerce, Chinita, MD [facility] None     This document was completed utilizing speech recognition software. Grammatical errors, random word insertions, pronoun errors, and incomplete sentences are an occasional consequence of this system due to software limitations, ambient noise, and hardware issues. Any formal questions or concerns about the content, text or information contained within the body of this dictation should be directly addressed to the provider for clarification.

## 2023-11-20 ENCOUNTER — Ambulatory Visit: Admitting: Emergency Medicine

## 2023-11-20 ENCOUNTER — Ambulatory Visit (HOSPITAL_COMMUNITY)

## 2023-11-20 ENCOUNTER — Telehealth (HOSPITAL_COMMUNITY): Payer: Self-pay | Admitting: Professional

## 2023-11-20 NOTE — Telephone Encounter (Signed)
 See call log

## 2023-11-21 ENCOUNTER — Telehealth: Payer: Self-pay | Admitting: *Deleted

## 2023-11-21 ENCOUNTER — Emergency Department (HOSPITAL_BASED_OUTPATIENT_CLINIC_OR_DEPARTMENT_OTHER)

## 2023-11-21 ENCOUNTER — Encounter (HOSPITAL_BASED_OUTPATIENT_CLINIC_OR_DEPARTMENT_OTHER): Payer: Self-pay

## 2023-11-21 ENCOUNTER — Other Ambulatory Visit: Payer: Self-pay

## 2023-11-21 ENCOUNTER — Emergency Department (HOSPITAL_BASED_OUTPATIENT_CLINIC_OR_DEPARTMENT_OTHER)
Admission: EM | Admit: 2023-11-21 | Discharge: 2023-11-21 | Disposition: A | Discharge status: Home / Self Care | Attending: Emergency Medicine | Admitting: Emergency Medicine

## 2023-11-21 ENCOUNTER — Ambulatory Visit (INDEPENDENT_AMBULATORY_CARE_PROVIDER_SITE_OTHER): Admitting: Licensed Clinical Social Worker

## 2023-11-21 ENCOUNTER — Ambulatory Visit: Payer: Self-pay | Admitting: Physical Therapy

## 2023-11-21 DIAGNOSIS — R109 Unspecified abdominal pain: Secondary | ICD-10-CM | POA: Diagnosis not present

## 2023-11-21 DIAGNOSIS — R1032 Left lower quadrant pain: Secondary | ICD-10-CM | POA: Diagnosis not present

## 2023-11-21 DIAGNOSIS — F319 Bipolar disorder, unspecified: Secondary | ICD-10-CM | POA: Diagnosis not present

## 2023-11-21 DIAGNOSIS — Z8739 Personal history of other diseases of the musculoskeletal system and connective tissue: Secondary | ICD-10-CM | POA: Diagnosis not present

## 2023-11-21 DIAGNOSIS — R110 Nausea: Secondary | ICD-10-CM | POA: Diagnosis not present

## 2023-11-21 DIAGNOSIS — Z794 Long term (current) use of insulin: Secondary | ICD-10-CM | POA: Insufficient documentation

## 2023-11-21 DIAGNOSIS — M79672 Pain in left foot: Secondary | ICD-10-CM | POA: Insufficient documentation

## 2023-11-21 DIAGNOSIS — R519 Headache, unspecified: Secondary | ICD-10-CM | POA: Insufficient documentation

## 2023-11-21 DIAGNOSIS — M25511 Pain in right shoulder: Secondary | ICD-10-CM | POA: Insufficient documentation

## 2023-11-21 DIAGNOSIS — R7989 Other specified abnormal findings of blood chemistry: Secondary | ICD-10-CM | POA: Diagnosis not present

## 2023-11-21 DIAGNOSIS — F4310 Post-traumatic stress disorder, unspecified: Secondary | ICD-10-CM

## 2023-11-21 DIAGNOSIS — R748 Abnormal levels of other serum enzymes: Secondary | ICD-10-CM

## 2023-11-21 HISTORY — DX: Fibromyalgia: M79.7

## 2023-11-21 HISTORY — DX: Chronic fatigue, unspecified: R53.82

## 2023-11-21 LAB — CBC WITH DIFFERENTIAL/PLATELET
Abs Immature Granulocytes: 0.03 K/uL (ref 0.00–0.07)
Basophils Absolute: 0 K/uL (ref 0.0–0.1)
Basophils Relative: 1 %
Eosinophils Absolute: 0.2 K/uL (ref 0.0–0.5)
Eosinophils Relative: 3 %
HCT: 39 % (ref 36.0–46.0)
Hemoglobin: 12.9 g/dL (ref 12.0–15.0)
Immature Granulocytes: 0 %
Lymphocytes Relative: 29 %
Lymphs Abs: 2.2 K/uL (ref 0.7–4.0)
MCH: 29 pg (ref 26.0–34.0)
MCHC: 33.1 g/dL (ref 30.0–36.0)
MCV: 87.6 fL (ref 80.0–100.0)
Monocytes Absolute: 0.4 K/uL (ref 0.1–1.0)
Monocytes Relative: 6 %
Neutro Abs: 4.7 K/uL (ref 1.7–7.7)
Neutrophils Relative %: 61 %
Platelets: 264 K/uL (ref 150–400)
RBC: 4.45 MIL/uL (ref 3.87–5.11)
RDW: 14.2 % (ref 11.5–15.5)
WBC: 7.6 K/uL (ref 4.0–10.5)
nRBC: 0 % (ref 0.0–0.2)

## 2023-11-21 LAB — URINALYSIS, ROUTINE W REFLEX MICROSCOPIC
Bacteria, UA: NONE SEEN
Bilirubin Urine: NEGATIVE
Glucose, UA: NEGATIVE mg/dL
Ketones, ur: NEGATIVE mg/dL
Leukocytes,Ua: NEGATIVE
Nitrite: NEGATIVE
Protein, ur: NEGATIVE mg/dL
Specific Gravity, Urine: 1.009 (ref 1.005–1.030)
pH: 5 (ref 5.0–8.0)

## 2023-11-21 LAB — COMPREHENSIVE METABOLIC PANEL WITH GFR
ALT: 13 U/L (ref 0–44)
AST: 25 U/L (ref 15–41)
Albumin: 4.3 g/dL (ref 3.5–5.0)
Alkaline Phosphatase: 67 U/L (ref 38–126)
Anion gap: 15 (ref 5–15)
BUN: 15 mg/dL (ref 6–20)
CO2: 23 mmol/L (ref 22–32)
Calcium: 10.3 mg/dL (ref 8.9–10.3)
Chloride: 99 mmol/L (ref 98–111)
Creatinine, Ser: 0.67 mg/dL (ref 0.44–1.00)
GFR, Estimated: >60 mL/min (ref >60)
Glucose, Bld: 83 mg/dL (ref 70–99)
Potassium: 4.6 mmol/L (ref 3.5–5.1)
Sodium: 136 mmol/L (ref 135–145)
Total Bilirubin: 0.2 mg/dL (ref 0.0–1.2)
Total Protein: 7.2 g/dL (ref 6.5–8.1)

## 2023-11-21 LAB — LIPASE, BLOOD: Lipase: 59 U/L — ABNORMAL HIGH (ref 11–51)

## 2023-11-21 LAB — PREGNANCY, URINE: Preg Test, Ur: NEGATIVE

## 2023-11-21 MED ORDER — IOHEXOL 300 MG/ML  SOLN
100.0000 mL | Freq: Once | INTRAMUSCULAR | Status: AC | PRN
  Administered 2023-11-21: 100 mL via INTRAVENOUS

## 2023-11-21 MED ORDER — OXYCODONE-ACETAMINOPHEN 5-325 MG PO TABS
1.0000 | ORAL_TABLET | Freq: Once | ORAL | Status: AC
  Administered 2023-11-21: 1 via ORAL
  Filled 2023-11-21: qty 1

## 2023-11-21 MED ORDER — ONDANSETRON HCL 4 MG/2ML IJ SOLN
4.0000 mg | Freq: Once | INTRAMUSCULAR | Status: AC
  Administered 2023-11-21: 4 mg via INTRAVENOUS
  Filled 2023-11-21: qty 2

## 2023-11-21 MED ORDER — MORPHINE SULFATE (PF) 4 MG/ML IV SOLN
4.0000 mg | Freq: Once | INTRAVENOUS | Status: AC
  Administered 2023-11-21: 4 mg via INTRAVENOUS
  Filled 2023-11-21: qty 1

## 2023-11-21 MED ORDER — KETOROLAC TROMETHAMINE 15 MG/ML IJ SOLN
15.0000 mg | Freq: Once | INTRAMUSCULAR | Status: AC
Start: 2023-11-21 — End: 2023-11-21
  Administered 2023-11-21: 15 mg via INTRAVENOUS
  Filled 2023-11-21: qty 1

## 2023-11-21 MED ORDER — DOCUSATE SODIUM 50 MG/5ML PO LIQD
50.0000 mg | Freq: Once | ORAL | Status: AC
  Administered 2023-11-21: 50 mg via ORAL
  Filled 2023-11-21: qty 10

## 2023-11-21 NOTE — ED Triage Notes (Signed)
 Right shoulder pain, left side pain, left foot pain. PMH fibromyalgia and chronic fatigue syndrome. Diag right ear infection - currently on Augmentin . Nausea. Denies vomiting.

## 2023-11-21 NOTE — Discharge Instructions (Addendum)
 Your left foot pain it may be due to a condition called sesamoiditis, please follow-up with your sports medicine provider as needed if your symptoms persist.  Please ensure that you are wearing comfortable/well cushioned footwear, especially when you are on your feet for prolonged periods of time.  You may continue to ice your foot for relief of pain. You were also found to have a mildly elevated lipase, this is a pancreatic enzyme. Your CT scan did not show any irregularities with your pancreas. Please discuss this abnormal lab finding with your PCP. I believe that your other symptoms may be secondary to a fibromyalgia flare, please follow-up with your PCP and pain management specialist as scheduled. Return to the emergency department if your symptoms worsen.

## 2023-11-21 NOTE — ED Provider Notes (Signed)
 [place] EMERGENCY DEPARTMENT AT [facility] Provider Note   CSN: [id number] Arrival date & time: 11/21/23  1215     Patient presents with: Shoulder Pain and Fatigue   Heidi Holland is a 48 y.o. female.   48 year old female presenting with multiple complaints.  Patient with history of fibromyalgia and chronic pain, followed by pain management, on Norco for management of chronic pain.  Presents with several days of worsening pain in multiple locations, including the plantar aspect of her left foot, her left flank, her right shoulder.  Patient notes that pain to plantar aspect of left foot is new, she has even had difficulty bearing full weight on her left foot secondary to pain, denies any known injury/inciting event.  Patient states I have to get a lot of Cts of my abdomen when I come to the ER, reporting left flank pain with nausea but no vomiting, she did take 2 Zofran  at home before coming to the ER today.  History of gastric bypass.  She notes pain in her right shoulder that is worse with overhead reach, no known inciting event/injury.  She also endorses a headache that is consistent with her typical migraines, with pain mostly to the posterior aspect of her head/neck.  She is also being treated for a suspected ear infection, was started on Augmentin  after 8 virtual urgent care visit, reports occasional sharp pains in her ear but mostly issues with muffled hearing.  No fever.   Shoulder Pain      Prior to Admission medications   Medication Sig Start Date End Date Taking? Authorizing Provider  amoxicillin -clavulanate (AUGMENTIN ) 875-125 MG tablet Take 1 tablet by mouth 2 (two) times daily for 7 days. 11/16/23 11/23/23 Yes Fleming, Zelda W, NP  botulinum toxin Type A  (BOTOX ) 200 units injection Inject 155 units IM into multiple site in the face,neck and head once every 90 days 11/13/23   Skeet Juliene SAUNDERS, DO  buPROPion  (WELLBUTRIN  XL) 150 MG 24 hr tablet Take 1 tablet (150 mg  total) by mouth daily for 14 days. 10/24/23 12/13/23  Kennyth Starleen RAMAN, MD  clonazePAM  (KLONOPIN ) 0.5 MG tablet Take 0.5 mg by mouth 2 (two) times daily. 11/10/23   [provider]  fluconazole  (DIFLUCAN ) 150 MG tablet Take 1 tablet (150 mg total) by mouth every three (3) days as needed. 11/16/23   Fleming, Zelda W, NP  HYDROcodone -acetaminophen  (NORCO) 10-325 MG tablet Take 1 tablet by mouth every 6 (six) hours as needed.    [provider]  losartan  (COZAAR ) 50 MG tablet Take 1 tablet (50 mg total) by mouth daily. 10/28/23   Kennyth Worth HERO, MD  meclizine  (ANTIVERT ) 25 MG tablet Take 1 tablet (25 mg total) by mouth 3 (three) times daily as needed for dizziness. 11/16/23   Fleming, Zelda W, NP  methocarbamol  (ROBAXIN ) 750 MG tablet Take 750 mg by mouth 3 (three) times daily as needed. 11/02/23   [provider]  ondansetron  (ZOFRAN -ODT) 4 MG disintegrating tablet Take 4 mg by mouth every 6 (six) hours as needed. 10/24/23   [provider]  pantoprazole  (PROTONIX ) 40 MG tablet Take 40 mg by mouth daily. 11/04/23   [provider]  pregabalin  (LYRICA ) 200 MG capsule Take 1 capsule (200 mg total) by mouth 2 (two) times daily for 7 days. 10/23/23 12/13/23  Kennyth Starleen RAMAN, MD  QUEtiapine  (SEROQUEL ) 200 MG tablet Take 200 mg by mouth 2 (two) times daily. 11/05/23   [provider]  rosuvastatin  (CRESTOR )  5 MG tablet Take 5 mg by mouth daily. 10/23/23   [provider]  SUMAtriptan  (IMITREX ) 100 MG tablet Take 100 mg by mouth every 2 (two) hours as needed for migraine. 11/02/23   [provider]  tirzepatide  (MOUNJARO ) 12.5 MG/0.5ML Pen Inject 12.5 mg into the skin once a week. 10/28/23   Kennyth Worth HERO, MD  zolpidem  (AMBIEN ) 10 MG tablet Take 10 mg by mouth at bedtime. 10/25/23   [provider]    Allergies: Ciprofloxacin and Wound dressing adhesive    Review of Systems  Updated Vital Signs  Vitals:   11/21/23 1539 11/21/23 1545  11/21/23 1615 11/21/23 1647  BP: 123/63 105/68 105/68   Pulse:   83   Resp:   13   Temp:    98.6 F (37 C)  TempSrc:    Oral  SpO2:   95%   Weight:      Height:         Physical Exam Vitals and nursing note reviewed.  HENT:     Head: Normocephalic.     Comments: Unable to view R TM secondary to cerumen impaction     Right Ear: There is impacted cerumen.     Left Ear: Tympanic membrane normal.  Eyes:     Extraocular Movements: Extraocular movements intact.  Neck:     Comments: No meningismus Cardiovascular:     Rate and Rhythm: Normal rate and regular rhythm.  Pulmonary:     Effort: Pulmonary effort is normal.     Breath sounds: Normal breath sounds.  Abdominal:     Palpations: Abdomen is soft.     Tenderness: There is abdominal tenderness (left flank). There is no guarding.  Musculoskeletal:     Cervical back: Normal range of motion. No rigidity or tenderness.     Right lower leg: No edema.     Left lower leg: No edema.     Comments: Moves all extremities spontaneously without difficulty L foot and ankle: Tenderness to plantar aspect of foot at base of toes, no deformity/bruising, no erythema/swelling/warmth, full ROM at the ankle without tenderness of medial/lateral malleolus RUE: Full active and passive ROM. Mild TTP of the posterior shoulder in the distribution of the trapezius, no bony deformity.  Skin:    General: Skin is warm and dry.  Neurological:     Mental Status: She is alert and oriented to person, place, and time.     (all labs ordered are listed, but only abnormal results are displayed) Labs Reviewed  LIPASE, BLOOD - Abnormal; Notable for the following components:      Result Value   Lipase 59 (*)    All other components within normal limits  URINALYSIS, ROUTINE W REFLEX MICROSCOPIC - Abnormal; Notable for the following components:   Color, Urine COLORLESS (*)    Hgb urine dipstick SMALL (*)    All other components within normal limits   COMPREHENSIVE METABOLIC PANEL WITH GFR  CBC WITH DIFFERENTIAL/PLATELET  PREGNANCY, URINE    EKG: None  Radiology: CT ABDOMEN PELVIS W CONTRAST Result Date: 11/21/2023 CLINICAL DATA:  Left flank pain. Elevated lipase. Right shoulder pain and left-sided pain. Fibromyalgia with chronic fatigue syndrome. Nausea. EXAM: CT ABDOMEN AND PELVIS WITH CONTRAST TECHNIQUE: Multidetector CT imaging of the abdomen and pelvis was performed using the standard protocol following bolus administration of intravenous contrast. RADIATION DOSE REDUCTION: This exam was performed according to the departmental dose-optimization program which includes automated exposure control, adjustment of the mA  and/or kV according to patient size and/or use of iterative reconstruction technique. CONTRAST:  OMNIPAQUE  IOHEXOL  300 MG/ML  SOLN COMPARISON:  01/16/2023 FINDINGS: Lower chest: Lung bases are clear. Hepatobiliary: No focal liver abnormality is seen. No gallstones, gallbladder wall thickening, or biliary dilatation. Pancreas: Unremarkable. No pancreatic ductal dilatation or surrounding inflammatory changes. Spleen: Normal in size without focal abnormality. Adrenals/Urinary Tract: No adrenal gland nodules. Kidneys are symmetrical. Homogeneous nephrograms. No solid lesions. No hydronephrosis or hydroureter. Bladder is normal. Stomach/Bowel: Postoperative changes consistent with gastric bypass. Fluid in the excluded stomach similar to prior study, possibly retrograde. Stomach, small bowel, and colon are not abnormally distended. Stool throughout the colon. Rectosigmoid anastomosis. No wall thickening or inflammatory stranding. Appendix is normal. Vascular/Lymphatic: Aortic atherosclerosis. No enlarged abdominal or pelvic lymph nodes. Reproductive: Uterus and bilateral adnexa are unremarkable. Other: No free air or free fluid in the abdomen. Small periumbilical hernia containing fat. Stranding in the fat may indicate fat necrosis. No  bowel herniation. Musculoskeletal: No acute or significant osseous findings. Left hip arthroplasty. IMPRESSION: 1. No evidence of bowel obstruction or inflammation. 2. Small periumbilical hernia containing fat with possible fat necrosis. 3. Aortic atherosclerosis. Electronically Signed   By: Elsie Gravely M.D.   On: 11/21/2023 17:02   DG Foot Complete Left Result Date: 11/21/2023 CLINICAL DATA:  Plantar foot pain.  No known injury. EXAM: LEFT FOOT - COMPLETE 3 VIEW COMPARISON:  None Available. FINDINGS: There is no evidence of fracture or dislocation. Slightly increased sclerotic appearance of the medial hallux sesamoid. Soft tissues are unremarkable. IMPRESSION: Slightly increased sclerotic appearance of the medial hallux sesamoid, which may represent sesamoiditis. Electronically Signed   By: Limin  Xu M.D.   On: 11/21/2023 14:47     Procedures   Medications Ordered in the ED  ketorolac  (TORADOL ) 15 MG/ML injection 15 mg (15 mg Intravenous Given 11/21/23 1409)  docusate (COLACE) 50 MG/5ML liquid 50 mg (50 mg Oral Given 11/21/23 1420)  morphine  (PF) 4 MG/ML injection 4 mg (4 mg Intravenous Given 11/21/23 1533)  ondansetron  (ZOFRAN ) injection 4 mg (4 mg Intravenous Given 11/21/23 1533)  iohexol  (OMNIPAQUE ) 300 MG/ML solution 100 mL (100 mLs Intravenous Contrast Given 11/21/23 1603)  oxyCODONE -acetaminophen  (PERCOCET/ROXICET) 5-325 MG per tablet 1 tablet (1 tablet Oral Given 11/21/23 1750)                                    Medical Decision Making This patient presents to the ED for concern of multiple complaints, this involves an extensive number of treatment options, and is a complaint that carries with it a high risk of complications and morbidity.  The differential diagnosis includes chronic pain, fibromyalgia, fracture, acute otitis media, muscle sprain/strain/pain   Co morbidities that complicate the patient evaluation  Fibromyalgia, on opioids for chronic pain, iron deficiency anemia  requiring IV iron   Additional history obtained:  Additional history obtained from PDMP and record review External records from outside source obtained and reviewed including recent oncology note   Lab Tests:  I Ordered, and personally interpreted labs.  The pertinent results include: CBC unremarkable, no leukocytosis, normal hemoglobin.  Urinalysis notable for small RBCs, otherwise unremarkable.  Urine hCG negative. Lipase mildly elevated at 59.    Imaging Studies ordered:  I ordered imaging studies including XR L foot, CT abdomen/pelvis  I independently visualized and interpreted imaging which showed  - XR L foot: Slightly increased sclerotic  appearance of the medial hallux sesamoid, which may represent sesamoiditis. - CT abdomen/pelvis: 1. No evidence of bowel obstruction or inflammation. 2. Small periumbilical hernia containing fat with possible fat necrosis. 3. Aortic atherosclerosis.    I agree with the radiologist interpretation   Cardiac Monitoring: / EKG:  The patient was maintained on a cardiac monitor.  I personally viewed and interpreted the cardiac monitored which showed an underlying rhythm of: NSR    Problem List / ED Course / Critical interventions / Medication management  I ordered medication including ketorolac  and morphine  then Percocet for pain, Zofran  for nausea Reevaluation of the patient after these medicines showed that the patient improved I have reviewed the patients home medicines and have made adjustments as needed   Social Determinants of Health:  Housing and financial instability, depression, unmet transportation needs   Test / Admission - Considered:  Physical exam notable as above.  Unable to visualize right TM secondary to impacted cerumen, ear was thoroughly irrigated using Colace/water by nursing staff, large amount of cerumen was irrigated out, subsequently I was able to visualize the patient's TM and there is no evidence of  perforation or bulging/erythema consistent with AOM, patient has almost completed course of antibiotic as directed by urgent care, I recommend that she complete this course despite reassuring findings on exam, as my suspicion for AOM is low but she very well could have had AOM that is now resolving due to use of antibiotics. Patient does have some left abdomen/flank tenderness, with mildly elevated lipase I have a low concern for pancreatitis but do feel that further CT imaging is warranted to rule out pancreatitis entirely, or other intra-abdominal pathology that may be contributing to her complaints.  See above for CT results, patient does have a periumbilical hernia but this is not a new finding, and this does not correlate with the location of her pain today. X-ray was of left foot notable for findings consistent with sesamoiditis, I discussed this with the patient, she is already seen by sports medicine physician and I recommend she follow-up in regard to this, although there is typically no specific management recommended I advised that she continue to ice the affected area and wear appropriate footwear, patient is unable to tolerate p.o. NSAIDs due to prior gastric bypass surgery. I discussed the findings above in depth with the patient, I suspect that the majority of her symptoms today may be explained by a flare of her fibromyalgia, which is managed with her PCP and pain management specialist.  I recommend that she follow-up with them as scheduled.  She voiced understanding is in agreement with this plan, return precautions discussed, she is appropriate for discharge at this time.    Amount and/or Complexity of Data Reviewed Labs: ordered. Radiology: ordered.  Risk OTC drugs. Prescription drug management.        Final diagnoses:  Left foot pain  History of fibromyalgia  Elevated lipase  Abdominal pain, unspecified abdominal location    ED Discharge Orders     None           Glendia Rocky SAILOR, PA-C 11/21/23 1804    Ruthe Cornet, DO 11/29/23 [phone]

## 2023-11-21 NOTE — Progress Notes (Signed)
 Complex Care Management Note Care Guide Note  11/21/2023 Name: Heidi Holland MRN: [id number] DOB: [birth year]   Complex Care Management Outreach Attempts: An unsuccessful telephone outreach was attempted today to offer the patient information about available complex care management services.  Follow Up Plan:  Additional outreach attempts will be made to offer the patient complex care management information and services.   Encounter Outcome:  No Answer  Thedford Franks, CMA [place]  [facility], [facility] Guide Direct Dial : [phone]  Fax: [phone] Website: [email or web address]

## 2023-11-22 ENCOUNTER — Ambulatory Visit (INDEPENDENT_AMBULATORY_CARE_PROVIDER_SITE_OTHER): Admitting: Licensed Clinical Social Worker

## 2023-11-22 ENCOUNTER — Ambulatory Visit: Admitting: Neurology

## 2023-11-22 ENCOUNTER — Ambulatory Visit (HOSPITAL_COMMUNITY)

## 2023-11-22 DIAGNOSIS — R4589 Other symptoms and signs involving emotional state: Secondary | ICD-10-CM

## 2023-11-22 DIAGNOSIS — G43709 Chronic migraine without aura, not intractable, without status migrainosus: Secondary | ICD-10-CM

## 2023-11-22 DIAGNOSIS — F4310 Post-traumatic stress disorder, unspecified: Secondary | ICD-10-CM

## 2023-11-22 DIAGNOSIS — F319 Bipolar disorder, unspecified: Secondary | ICD-10-CM

## 2023-11-22 MED ORDER — ONABOTULINUMTOXINA 100 UNITS IJ SOLR
200.0000 [IU] | Freq: Once | INTRAMUSCULAR | Status: AC
  Administered 2023-11-22: 155 [IU] via INTRAMUSCULAR

## 2023-11-22 NOTE — Progress Notes (Signed)
 [facility]   Procedure Note Botox   Attending: Dr. Juliene Dunnings  Preoperative Diagnosis(es): Chronic migraine  Consent obtained from: The patient Benefits discussed included, but were not limited to decreased muscle tightness, increased joint range of motion, and decreased pain.  Risk discussed included, but were not limited pain and discomfort, bleeding, bruising, excessive weakness, venous thrombosis, muscle atrophy and dysphagia.  Anticipated outcomes of the procedure as well as he risks and benefits of the alternatives to the procedure, and the roles and tasks of the personnel to be involved, were discussed with the patient, and the patient consents to the procedure and agrees to proceed. A copy of the patient medication guide was given to the patient which explains the blackbox warning.  Patients identity and treatment sites confirmed Yes.  .  Details of Procedure: Skin was cleaned with alcohol. Prior to injection, the needle plunger was aspirated to make sure the needle was not within a blood vessel.  There was no blood retrieved on aspiration.    Following is a summary of the muscles injected  And the amount of Botulinum toxin used:  Dilution 200 units of Botox  was reconstituted with 4 ml of preservative free normal saline. Time of reconstitution: At the time of the office visit (<30 minutes prior to injection)   Injections  155 total units of Botox  was injected with a 30 gauge needle.  Injection Sites: L occipitalis: 15 units- 3 sites  R occiptalis: 15 units- 3 sites  L upper trapezius: 15 units- 3 sites R upper trapezius: 15 units- 3 sits          L paraspinal: 10 units- 2 sites R paraspinal: 10 units- 2 sites  Face As patient endorsed ongoing pain across the frontalis bilaterally, increased dosing. L frontalis(3 injection sites):15 units   R frontalis(3 injection sites):15 units         L corrugator: 5 units   R corrugator: 5 units           Procerus: 5 units   L  temporalis: 20 units R temporalis: 20 units   Agent:  200 units of botulinum Type A (Onobotulinum Toxin type A) was reconstituted with 4 ml of preservative free normal saline.  Time of reconstitution: At the time of the office visit (<30 minutes prior to injection)     Total injected (Units): 165  Total wasted (Units): 35  Patient tolerated procedure well without complications.   Reinjection is anticipated in 3 months.

## 2023-11-22 NOTE — Progress Notes (Signed)
 Complex Care Management Note Care Guide Note  11/22/2023 Name: Heidi Holland MRN: [id number] DOB: 1975/06/02   Complex Care Management Outreach Attempts: A second unsuccessful outreach was attempted today to offer the patient with information about available complex care management services.  Follow Up Plan:  Additional outreach attempts will be made to offer the patient complex care management information and services.   Encounter Outcome:  No Answer  Thedford Franks, CMA [place]  [facility], [facility] Guide Direct Dial : [phone]  Fax: [phone] Website: [email or web address]

## 2023-11-22 NOTE — Psych (Signed)
 [facility] PHP THERAPIST PROGRESS NOTE  Heidi Holland [id number]   Session Time: 9:00 am - 10:00 am  Participation Level: Active  Behavioral Response: CasualAlertAnxious and Depressed  Type of Therapy: Group Therapy  Treatment Goals addressed: Coping  Progress Towards Goals: Progressing  Interventions: CBT, DBT, Solution Focused, Strength-based, Supportive, and Reframing  Therapist Response: Clinician led check-in regarding current stressors and situation, and review of patient completed daily inventory. Clinician utilized active listening and empathetic response and validated patient emotions. Clinician facilitated processing group on pertinent issues.?   Summary: Patient arrived within time allowed. Patient rates their depression at a 6  and anxiety at a 5 on a scale of 1-10 with 10 being best. Pt reports continued pain and states she went to the ED yesterday, and during that visit they found a significant cerumen impaction and inflamed bones in her foot. When asked about sleep and appetite, pt reports she slept 7 hours last night and ate 2 meals yesterday. Pt denied experiencing SI/SH thoughts since last session. Pt able to process.?Pt engaged in discussion.?      Session Time: 10:00 am - 11:00 am  Participation Level: Active  Behavioral Response: CasualAlertAnxious and Depressed  Type of Therapy: Group Therapy  Treatment Goals addressed: Coping  Progress Towards Goals: Progressing  Interventions: CBT, DBT, Solution Focused, Strength-based, Supportive, and Reframing  Therapist Response: Clinician led processing group for pt's current struggles. Group members shared stressors and provided support and feedback. Clinician brought in topics of self-worth to inform discussion.  Summary: Pt able to process and provide support to group.     Session Time: 11:00 am - 12:00 pm  Participation Level: Active  Behavioral Response: CasualAlertAnxious and Depressed  Type of  Therapy: Group Therapy  Treatment Goals addressed: Coping  Progress Towards Goals: Progressing  Interventions: CBT, DBT, Solution Focused, Strength-based, Supportive, and Reframing  Therapist Response: Clinician guided patients through an exercise called "perspective-taking" in which patients were asked to visualize themselves holding onto something that is currently causing them pain and to think of it as a memory. Patients were invited to share their thoughts and feelings after the exercise was completed. Clinician utilized ACT principles to inform discussion.  Summary: Pt engaged in discussion and demonstrated good insight into the subject matter.    Session Time: 12:00 pm - 1:00 pm (Pt left at 12:30)  Participation Level: Active  Behavioral Response: CasualAlertAnxious and Depressed  Type of Therapy: Group Therapy  Treatment Goals addressed: Coping  Progress Towards Goals: Progressing  Interventions: CBT, DBT, Solution Focused, Strength-based, Supportive, and Reframing  Therapist Response: 12:00 - 12:30 pm: Group was led by occupational therapist, [place] Purpura. 12:50 - 1:00 pm: Clinician led check-out. Clinician assessed for immediate needs, medication compliance and efficacy, and safety concerns?  Summary: 12:00 - 12:30 pm: Pt engaged and participated in discussion. She left at 12:30 pm for a medical appointment.  Suicidal/Homicidal: Nowithout intent/plan  Plan: ?Pt will continue in PHP and medication management while continuing to work on decreasing depression symptoms,?SI, and anxiety symptoms,?and increasing the ability to self manage symptoms.     Collaboration of Care: Medication Management AEB Staci Kerns, NP  Patient/Guardian was advised Release of Information must be obtained prior to any record release in order to collaborate their care with an outside provider. Patient/Guardian was advised if they have not already done so to contact the registration department  to sign all necessary forms in order for us  to release information regarding their care.   Consent: Patient/Guardian  gives verbal consent for treatment and assignment of benefits for services provided during this visit. Patient/Guardian expressed understanding and agreed to proceed.   Diagnosis: Bipolar 1 disorder, depressed (HCC) [F31.9]    1. Bipolar 1 disorder, depressed (HCC)   2. PTSD (post-traumatic stress disorder)       Will LILLETTE Pollack, LCSW 11/22/2023

## 2023-11-25 ENCOUNTER — Ambulatory Visit: Payer: Self-pay | Admitting: Physical Therapy

## 2023-11-25 ENCOUNTER — Ambulatory Visit (HOSPITAL_COMMUNITY)

## 2023-11-25 NOTE — Progress Notes (Signed)
 Complex Care Management Note  Care Guide Note 11/25/2023 Name: Heidi Holland MRN: [id number] DOB: 09/15/1975  Heidi Holland is a 48 y.o. year old female who sees Kennyth Worth HERO, MD for primary care. I reached out to Lauraine Riding by phone today to offer complex care management services.  [name] was given information about Complex Care Management services today including:   The Complex Care Management services include support from the care team which includes your Nurse Care Manager, Clinical Social Worker, or Pharmacist.  The Complex Care Management team is here to help remove barriers to the health concerns and goals most important to you. Complex Care Management services are voluntary, and the patient may decline or stop services at any time by request to their care team member.   Complex Care Management Consent Status: Patient did not agree to participate in complex care management services at this time.  Follow up plan:  Pt declined services is in [facility] program through hospital   Encounter Outcome:  Patient Refused  Thedford Franks, CMA [facility]  [facility], [facility] Care Guide Direct Dial : [phone]  Fax: [phone] Website: [email or web address]

## 2023-11-26 ENCOUNTER — Ambulatory Visit: Payer: Self-pay | Admitting: Rehabilitation

## 2023-11-26 ENCOUNTER — Ambulatory Visit (HOSPITAL_COMMUNITY)

## 2023-11-26 ENCOUNTER — Ambulatory Visit (INDEPENDENT_AMBULATORY_CARE_PROVIDER_SITE_OTHER): Admitting: Obstetrics and Gynecology

## 2023-11-26 ENCOUNTER — Encounter: Payer: Self-pay | Admitting: Obstetrics and Gynecology

## 2023-11-26 ENCOUNTER — Other Ambulatory Visit (HOSPITAL_COMMUNITY)
Admission: RE | Admit: 2023-11-26 | Discharge: 2023-11-26 | Disposition: A | Discharge status: Home / Self Care | Source: Ambulatory Visit | Attending: Obstetrics and Gynecology | Admitting: Obstetrics and Gynecology

## 2023-11-26 ENCOUNTER — Other Ambulatory Visit: Payer: Self-pay

## 2023-11-26 ENCOUNTER — Ambulatory Visit: Admitting: Family Medicine

## 2023-11-26 VITALS — BP 111/87 | HR 82 | Wt 175.2 lb

## 2023-11-26 DIAGNOSIS — R87612 Low grade squamous intraepithelial lesion on cytologic smear of cervix (LGSIL): Secondary | ICD-10-CM

## 2023-11-26 DIAGNOSIS — Z1331 Encounter for screening for depression: Secondary | ICD-10-CM

## 2023-11-26 DIAGNOSIS — Z3202 Encounter for pregnancy test, result negative: Secondary | ICD-10-CM | POA: Diagnosis not present

## 2023-11-26 DIAGNOSIS — N871 Moderate cervical dysplasia: Secondary | ICD-10-CM | POA: Diagnosis not present

## 2023-11-26 DIAGNOSIS — B3731 Acute candidiasis of vulva and vagina: Secondary | ICD-10-CM | POA: Diagnosis not present

## 2023-11-26 HISTORY — PX: COLPOSCOPY W/ BIOPSY / CURETTAGE: SUR283

## 2023-11-26 LAB — POCT PREGNANCY, URINE: Preg Test, Ur: NEGATIVE

## 2023-11-26 MED ORDER — FLUCONAZOLE 150 MG PO TABS: 150.0000 mg | ORAL_TABLET | ORAL | 0 refills | Status: DC | PRN

## 2023-11-26 NOTE — Procedures (Addendum)
 Colposcopy Procedure Note  Pre-operative Diagnosis:  10/16/2023 pap smear: LSIL/18, 45+/16 negative.  03/01/2021 colpo: CIN1 with neg ECC 01/19/21 pap: same pap smear and hpv results  Patient states she has a long h/o abnormal paps and colpos  Post-operative Diagnosis: CIN 1-2. Yeast infection  Procedure Details  Urine pregnancy test: negative. Cervical exam performed in the presence of a chaperone The risks (including infection, bleeding, pain) and benefits of the procedure were explained to the patient and written informed consent was obtained.  The patient was placed in the dorsal lithotomy position. Mild atrophy of EGBUS seen. A Graves was speculum inserted in the vagina, and the cervix was visualized; +white cottage white cheese d/c seen. Acetic acid staining was done and the cervix was viewed with green filter; lugol's staining with green filter was also done.  Biopsy from 6 and 12 o'clock and then single toothed tenaculum applied and endocervical curettage in all four quadrants done. There was no bleeding after procedure.    Findings: 6 o'clock at the external os/TZ was erythematous. 12 o'clock with normal AWE changes. AWE changes seen diffusely on the cervix  Adequate: Yes  Specimens: 6 and 12 o'clock cervix (sent together) and ECC  Condition: Stable  Complications: None  Plan: Patient also denies any tobacco use or 2nd hand smoke exposure.  The patient was advised to call for any fever or for prolonged or severe pain or bleeding. She was advised to use OTC analgesics as needed for mild to moderate pain. 1wk pelvic rest advised  Patient has questions about doing HRT and skin tag removal. Pt to follow up with primary GYN for this.   Diflucan  dose sent in for her as she has abx recently for a URI and having d/c.    Bebe Izell Raddle MD Attending Center for [employer] [facility])

## 2023-11-27 ENCOUNTER — Ambulatory Visit (INDEPENDENT_AMBULATORY_CARE_PROVIDER_SITE_OTHER): Admitting: Licensed Clinical Social Worker

## 2023-11-27 ENCOUNTER — Encounter: Payer: Self-pay | Admitting: Family Medicine

## 2023-11-27 ENCOUNTER — Ambulatory Visit: Admitting: Family Medicine

## 2023-11-27 DIAGNOSIS — F4310 Post-traumatic stress disorder, unspecified: Secondary | ICD-10-CM

## 2023-11-27 DIAGNOSIS — F319 Bipolar disorder, unspecified: Secondary | ICD-10-CM

## 2023-11-27 NOTE — Progress Notes (Deleted)
   LILLETTE Ileana Collet, PhD, LAT, ATC acting as a scribe for Artist Lloyd, MD.  Heidi Holland is a 48 y.o. female who presents to [facility] at [facility] today for L foot pain. Pt was previously seen by Dr. Lloyd on 08/19/23 for R hand pain in the setting of a fibromyalgia flare.  Today, pt c/o L foot pain ongoing since early Oct. Pt was seen at the [facility] on the 9th. Pt locates pain to ***  Dx testing: 11/21/23 L foot XR  Pertinent review of systems: ***  Relevant historical information: ***   Exam:  LMP 11/18/2023 (Exact Date)  General: Well Developed, well nourished, and in no acute distress.   MSK: ***    Lab and Radiology Results Results for orders placed or performed in visit on 11/26/23 (from the past 72 hours)  Pregnancy, urine POC     Status: None   Collection Time: 11/26/23  1:45 PM  Result Value Ref Range   Preg Test, Ur NEGATIVE NEGATIVE    Comment:        THE SENSITIVITY OF THIS METHODOLOGY IS >20 mIU/mL.    No results found.     Assessment and Plan: 48 y.o. female with ***   PDMP not reviewed this encounter. No orders of the defined types were placed in this encounter.  No orders of the defined types were placed in this encounter.    Discussed warning signs or symptoms. Please see discharge instructions. Patient expresses understanding.   ***

## 2023-11-28 ENCOUNTER — Ambulatory Visit (HOSPITAL_COMMUNITY)

## 2023-11-28 ENCOUNTER — Ambulatory Visit: Payer: Self-pay | Admitting: Physical Therapy

## 2023-11-28 LAB — SURGICAL PATHOLOGY

## 2023-11-29 ENCOUNTER — Ambulatory Visit: Payer: Self-pay | Admitting: Obstetrics and Gynecology

## 2023-11-29 ENCOUNTER — Ambulatory Visit (INDEPENDENT_AMBULATORY_CARE_PROVIDER_SITE_OTHER): Admitting: Licensed Clinical Social Worker

## 2023-11-29 DIAGNOSIS — N871 Moderate cervical dysplasia: Secondary | ICD-10-CM

## 2023-11-29 DIAGNOSIS — F4310 Post-traumatic stress disorder, unspecified: Secondary | ICD-10-CM

## 2023-11-29 DIAGNOSIS — F319 Bipolar disorder, unspecified: Secondary | ICD-10-CM | POA: Diagnosis not present

## 2023-11-29 NOTE — Progress Notes (Deleted)
 LILLETTE Ileana Collet, PhD, LAT, ATC acting as a scribe for Artist Lloyd, MD.  Heidi Holland is a 48 y.o. female who presents to [facility] at [facility] today for L foot pain. Pt was previously seen by Dr. Lloyd on 08/19/23 for R hand pain in the setting of a fibromyalgia flare.  Today, pt c/o L foot pain ongoing since early Oct. Pt was seen at the [facility] on the 9th. Pt locates pain to ***  Dx testing: 11/21/23 L foot XR   Pertinent review of systems: ***  Relevant historical information: ***   Exam:  LMP 11/18/2023 (Exact Date)  General: Well Developed, well nourished, and in no acute distress.   MSK: ***    Lab and Radiology Results Results for orders placed or performed in visit on 11/26/23 (from the past 72 hours)  Pregnancy, urine POC     Status: None   Collection Time: 11/26/23  1:45 PM  Result Value Ref Range   Preg Test, Ur NEGATIVE NEGATIVE    Comment:        THE SENSITIVITY OF THIS METHODOLOGY IS >20 mIU/mL.   Surgical pathology( [place]/ [facility])     Status: None   Collection Time: 11/26/23  2:03 PM  Result Value Ref Range   SURGICAL PATHOLOGY      SURGICAL PATHOLOGY CASE: [id number] [name] Surgical Pathology Report     Clinical History: LGSIL, HPV+ pap (cm)     FINAL MICROSCOPIC DIAGNOSIS:  A. CERVIX, 6 AND 12 O'CLOCK, BIOPSY: - High-grade squamous intraepithelial lesion (CIN2, high grade dysplasia)  B. ENDOCERVIX, CURETTAGE: - Benign cervical glandular mucosa - Negative for dysplasia or malignancy      GROSS DESCRIPTION:  A.  Received in formalin are tan, soft tissue fragments that are submitted in toto. Number: 2.  Size: 0.3 cm.  Blocks: 1  B.  Received in formalin is blood tinged mucus that is entirely submitted in one block.  Volume: 0.3 x 0.2 x 0.1 cm (1 B).  The specimen may not survive processing. (WC 11/27/2023)  Final Diagnosis performed by Katrine Muskrat, MD.    Electronically signed 11/28/2023 Technical component performed at [facility]. [facility], [address], [place], [place] [address].  Professional component performed at [facility], [address], [place], [place] [address].  Immunohistochemistry Technical component (if applicable) was performed at [facility]. [address], [place], [place] [address].   IMMUNOHISTOCHEMISTRY DISCLAIMER (if applicable): Some of these immunohistochemical stains may have been developed and the performance characteristics determine by [facility]. Some may not have been cleared or approved by the U.S. Food and Drug Administration. The FDA has determined that such clearance or approval is not necessary. This test is used for clinical purposes. It should not be regarded as investigational or for research. This laboratory is certified under the Clinical Laboratory Improvement Amendments of 1988 (CLIA-88) as qualified to perform high complexity clinical laboratory testing.  The controls stained appropriately.   IHC stains are performed on formalin fixed, paraffin embedded tissue using a 3,3diaminobenzidine (DAB) chromoge n and Leica Bond Autostainer System. The staining intensity of the nucleus is score manually and is reported as the percentage of tumor cell nuclei demonstrating specific nuclear staining. The specimens are fixed in 10% Neutral Formalin for at least 6 hours and up to 72hrs. These tests are validated on decalcified tissue. Results should be interpreted with caution given the possibility of  false negative results on decalcified specimens. Antibody Clones are as follows ER-clone 38F, PR-clone 16, Ki67- clone MM1. Some of these immunohistochemical stains may have been developed and the performance characteristics determined by [facility].    No results found.     Assessment and Plan: 48 y.o. female with  ***   PDMP not reviewed this encounter. No orders of the defined types were placed in this encounter.  No orders of the defined types were placed in this encounter.    Discussed warning signs or symptoms. Please see discharge instructions. Patient expresses understanding.   ***

## 2023-12-01 ENCOUNTER — Encounter (HOSPITAL_COMMUNITY): Payer: Self-pay

## 2023-12-01 NOTE — Therapy (Signed)
 [facility] [address] [place], [place], [address] Phone: [phone]   Fax:  [phone]  Occupational Therapy Evaluation  Patient Details  Name: Heidi Holland MRN: [id number] Date of Birth: 09/16/1975 No data recorded  Encounter Date: 11/22/2023   OT End of Session - 12/01/23 2244     Visit Number 1    Number of Visits 15    Date for Recertification  12/12/23    OT Start Time 1200    OT Stop Time 1250    OT Time Calculation (min) 50 min    Activity Tolerance Patient tolerated treatment well    Behavior During Therapy [facility] for tasks assessed/performed          Past Medical History:  Diagnosis Date   Abnormal Pap smear of cervix    Allergy    Anxiety    Asthma    Bipolar 1 disorder (HCC)    Chronic fatigue    Chronic pain    Depression    Diabetes mellitus without complication (HCC)    Diabetes mellitus, type II (HCC)    Diverticulitis    Fibromyalgia    GERD (gastroesophageal reflux disease)    HPV in female    Hyperlipidemia    Hypertension    Migraines    Osteoarthritis    Ovarian cyst    right   PCOS (polycystic ovarian syndrome)    STD (sexually transmitted disease)    Trichomonas contact, treated    Vertigo     Past Surgical History:  Procedure Laterality Date   BOWEL RESECTION     COLPOSCOPY W/ BIOPSY / CURETTAGE  11/26/2023   gasticbypass     GASTRIC BYPASS  2016   KNEE ARTHROSCOPY Bilateral    SHOULDER SURGERY Left    TOTAL HIP ARTHROPLASTY Left     There were no vitals filed for this visit.   Subjective Assessment - 12/01/23 2243     Subjective  I hope to learn new coping skills while I am here.    Pertinent History MDD, PTSD, BP1    Currently in Pain? No/denies    Pain Score 0-No pain            Group Session:    O: Group session focused on identification and application of grounding strategies to support emotional regulation and reduce cognitive distress. Discussion covered sensory,  cognitive, and movement-based techniques for shifting attention from intrusive thoughts or heightened emotional states to present-moment awareness. Patients practiced guided grounding activities and reflected on situations in which these techniques may assist with managing anxiety, rumination, or dissociative symptoms. Group emphasized skill generalization for improved daily functioning and coping.  A: Patient was attentive and actively engaged in group discussion and activities. Demonstrated insight, shared personal experiences, and contributed appropriately to peer dialogue. Able to identify relevant grounding strategies for use in daily situations.    OT Assessment OCAIRS Mental Health Interview Summary of Client Scores:  Facilitates participation in occupation Allows participation in occupation Inhibits participation in occupation Restricts participation in occupation Comments:  Roles   X    Habits   X    Personal Causation   X    Values   X    Interests  X     Skills    X   Short-Term Goals    X   Long-term Goals   X    Interpretation of Past Experiences  X     Physical Environment  X  Social Environment   X    Readiness for Change  X       Need for Occupational Therapy:  4 Shows positive occupational participation, no need for OT.   3 Need for minimal intervention/consultative participation   2 Need for OT intervention indicated to restore/improve participation   1 Need for extensive OT intervention indicated to improve participation.  Referral for follow up services also recommended.   Assessment:  Patient demonstrates behavior that INHIBITS participation in occupation.  Patient will benefit from occupational therapy intervention in order to improve time management, financial management, stress management, job readiness skills, social skills, and health management skills in preparation to return to full time community living and to be a productive community member.    Plan:   Patient will participate in skilled occupational therapy sessions individually or in a group setting to improve coping skills, psychosocial skills, and emotional skills required to return to prior level of function. Treatment will be 4-5 times per week for 3 weeks.                     OT Education - 12/01/23 2244     Education Details OCAIRS / Tx: Grounding Techniques    Person(s) Educated Patient    Methods Handout    Comprehension Verbalized understanding           OT Short Term Goals - 12/01/23 2248       OT SHORT TERM GOAL #1   Title Patient will identify and demonstrate at least two grounding or coping strategies to manage emotional distress, reporting decreased intensity of negative thoughts or anxiety during group participation and daily activities.    Time 4    Period Weeks    Status On-going    Target Date 12/12/23      OT SHORT TERM GOAL #2   Title Patient will develop and implement a structured daily routine that incorporates at least one productive, one self-care, and one leisure activity to improve consistency, balance, and sense of purpose throughout the week.      OT SHORT TERM GOAL #3   Title Patient will identify one personal goal related to daily functioning or wellness and apply the SMART goal framework to create a clear, measurable, and achievable plan to improve engagement in meaningful roles.                   Plan - 12/01/23 2246     Clinical Impression Statement Pt presents w/ deficits across multiple psychosocial domains that inhibit participation in daily activities and overall occupational performance    OT Occupational Profile and History Problem Focused Assessment - Including review of records relating to presenting problem    Occupational performance deficits (Please refer to evaluation for details): IADL's;ADL's;Rest and Sleep;Work;Leisure;Social Participation    Psychosocial Skills Coping Strategies;Environmental   Adaptations;Habits;Interpersonal Interaction;Routines and Behaviors    Rehab Potential Good    Clinical Decision Making Limited treatment options, no task modification necessary    Comorbidities Affecting Occupational Performance: None    Modification or Assistance to Complete Evaluation  No modification of tasks or assist necessary to complete eval    OT Frequency 5x / week    OT Duration 4 weeks    OT Treatment/Interventions Psychosocial skills training;Coping strategies training          Patient will benefit from skilled therapeutic intervention in order to improve the following deficits and impairments:       Psychosocial Skills:  Coping Strategies, Environmental  Adaptations, Habits, Interpersonal Interaction, Routines and Behaviors   Visit Diagnosis: Difficulty coping  Bipolar 1 disorder, depressed (HCC)  PTSD (post-traumatic stress disorder)    Problem List Patient Active Problem List   Diagnosis Date Noted   History of abnormal mammogram 11/19/2023   PTSD (post-traumatic stress disorder) 10/30/2023   Generalized anxiety disorder 10/19/2023   Mood disorder 10/19/2023   Bipolar 1 disorder, mixed, severe (HCC) 10/18/2023   Meralgia paresthetica of left side 07/15/2023   Fibromyalgia 06/27/2023   Ovarian cyst 02/20/2023   Essential hypertension 02/20/2023   Bipolar 1 disorder, depressed (HCC) 02/16/2022   Paresthesia 09/13/2021   CIN I (cervical intraepithelial neoplasia I) 03/03/2021   LSIL pap smear of cervix/human papillomavirus (HPV) positive 10/24/2020   Family history of early CAD 10/24/2020   B12 deficiency 07/26/2020   Somatic dysfunction of spine, cervical 07/21/2020   Status post left hip replacement 03/12/2019   T2DM (type 2 diabetes mellitus) (HCC) 07/31/2018   Dyslipidemia associated with type 2 diabetes mellitus (HCC) 07/31/2018   Allergic rhinitis 07/31/2018   Mild intermittent asthma, uncomplicated 07/31/2018   GERD (gastroesophageal reflux  disease) 07/31/2018   S/P gastric bypass 07/31/2018   Migraine 07/31/2018   Anxiety 07/31/2018   Depression, major, single episode, complete remission 07/31/2018   Insomnia 07/31/2018   Umbilical hernia 07/31/2018   Iron deficiency anemia 06/09/2018   Cervicogenic headache 05/12/2018   Low back pain 05/12/2018   Polyarthralgia 05/12/2018    D[place] KANDICE Purpura, OT 12/01/2023, 10:57 PM  Dallas Purpura, OT   [facility] [address] [place], [place], [address] Phone: [phone]   Fax:  [phone]  Name: Mackensie Pilson MRN: [id number] Date of Birth: 1975-06-21

## 2023-12-02 ENCOUNTER — Ambulatory Visit: Admitting: Family Medicine

## 2023-12-02 ENCOUNTER — Encounter: Payer: Self-pay | Admitting: Obstetrics and Gynecology

## 2023-12-02 DIAGNOSIS — N871 Moderate cervical dysplasia: Secondary | ICD-10-CM | POA: Insufficient documentation

## 2023-12-02 NOTE — Progress Notes (Addendum)
  [facility] Partial Outpatient Program [facility]) Discharge Summary  Heidi Holland [id number]  Admission date: 11/10/2023 Discharge date: 11/30/2023  Reason for admission:  Per ininital admission assessement note: Heidi Holland 48 year old female who reports she was recently hospitalized, behavioral health for suicidal ideations with a plan to overdose.  She reports history related to chronic pain and became overwhelmed due to pain stressors.  Currently she is denying suicidal or homicidal ideations.  Denies auditory or  visual hallucinations.  She reports she is currently followed by [facility] for medication management and therapy services, currently prescribed Wellbutrin  150 mg daily, Seroquel  200 mg p.o. twice daily, Lyrica  200 mg p.o. twice daily Ambien  10 mg p.o. nightly and Caplyta  3 mgs daily.    Progress in Program Toward Treatment Goals: Progressing patient attended and participated within the group sessions with active and engaged participation.  Per treatment team patient had multiple excused absences due to history of chronic health and follow-up appointments.  She was seen and evaluated face-to-face by this provider at discharge.  Denying any suicidal or homicidal ideations.  Reports she has plans to move so she is unable to attend the last week of group sessions.  She declined any medication refills.  Reports overall her mood is stabilized.  Reports a good appetite.  States she is resting okay throughout the night.  Plans to follow-up with [facility] for medication management and therapy services support, encouragement and reassurance was provided.  Progress (rationale): Take all of you medications as prescribed by your mental healthcare provider.  Report any adverse effects and reactions from your medications to your outpatient provider promptly.  Do not engage in alcohol and or illegal drug use while on prescription medicines. Keep all scheduled appointments. This is to  ensure that you are getting refills on time and to avoid any interruption in your medication.  If you are unable to keep an appointment call to reschedule.  Be sure to follow up with resources and follow ups given. In the event of worsening symptoms call the crisis hotline, 911, and or go to the nearest emergency department for appropriate evaluation and treatment of symptoms. Follow-up with your primary care provider for your medical issues, concerns and or health care needs.    Collaboration of Care: Medication Management AEB Continue medication as directed  Patient/Guardian was advised Release of Information must be obtained prior to any record release in order to collaborate their care with an outside provider. Patient/Guardian was advised if they have not already done so to contact the registration department to sign all necessary forms in order for us  to release information regarding their care.   Consent: Patient/Guardian gives verbal consent for treatment and assignment of benefits for services provided during this visit. Patient/Guardian expressed understanding and agreed to proceed.   Staci Kerns NP  12/02/2023

## 2023-12-03 ENCOUNTER — Telehealth: Payer: Self-pay

## 2023-12-03 NOTE — Progress Notes (Unsigned)
 NEUROLOGY FOLLOW UP OFFICE NOTE  Heidi Holland [id number]  Assessment/Plan:    1. Migraine without aura, without status migrainosus, not intractable - this flare up may be related to underlying emotional stress and pain. 2.  Benign paroxysmal positional vertigo.   MRI of brain scheduled for tomorrow For preventative management: Botox  - continue to monitor  For abortive therapy:  Sumatriptan  143m Zofran  for nausea Limit use of pain relievers to no more than 9 days out of the month to prevent risk of rebound or medication-overuse headache. Headache diary Follow up in 6 months.  Total time in chart and face to face with patient:  40 minutes.  Subjective:  Heidi Holland is a 48 year old right-handed Caucasian woman with hypertension, hyperlipidemia, type 2 diabetes, PTSD/anxiety/depression, chronic low back pain and history of TIA who follows up for migraines.   UPDATE: For further evaluation of dizziness, MRI of brain was ordered but is scheduled for tomorrow.    Restarted Botox .  Status post 2 treatments.    Migraines: After first treatment, she was averaging 4 days a month After second treatment (on 10/10), she had flare up of headaches and has had a daily headache.  However, she also was diagnosed with fibromyalgia and has been experiencing pain and back pain and dealing with a lot of stress, requiring hospitalization.  With sumatriptan , lasts less than a couple of hours.    Vertigo: She went to vestibular rehab but wasn't consistent because she was hospitalized.  She was diagnosed with BPPV based on [facility].  Now, vertigo comes and goes.    Bilateral leg cramps/left lumbar radiculopathy: MRI of thoracic spine was ordered by ortho but is scheduled for tomorrow.     Current medications: Current NSAIDS:  ASA 325mg , Current analgesics:  hydrocodone  Current triptans:  sumatriptan  25mg  Current ergotamine:  none Current anti-emetic:  Zofran  ODT 4mg  Current muscle  relaxants:  Robaxin  750mg  TID PRN, Tizanidine  4mg  PRN Current anti-anxiolytic:  Clonazepam , hydroxyzine  Current sleep aide:  trazodone  Current Antihypertensive medications:  none Current Antidepressant medications:   Current Anticonvulsant medications:  Lyrica  150mg  BID Current anti-CGRP: Current Vitamins/Herbal/Supplements:  none Current Antihistamines/Decongestants:  none Other therapy:  Botox  Hormone/birth control:  none Other medications:  Ambien , quetiapine  200mg    Caffeine:  Caffeine-free soda, 1 cup of coffee 2-3 times a week. Alcohol:  no Smoker:  no Diet:  Needs to improve water intake.  Skips meals.  Diet Coke daily Exercise:  Not routine Depression:  Poor.  Currently treated; Anxiety:  Poor.  Currently treated. Other pain:  Low back pain/lumbar radiculopathy Sleep hygiene:  Improved on medication   HISTORY:  Migraines: Onset:  Age 84 Location: occiput into neck or top of head.   Quality:  Pressure, throbbing Initial intensity:  8/10.  She denies new headache, thunderclap headache  Aura:  no Premonitory Phase:  Wakes up with heavy eyelids and stiff neck. Postdrome:  hangover effect - exhausted, irritable, neck soreness Associated symptoms:  Neck stiffness, nausea, sometimes vomiting, photophobia, phonophobia, blurred vision.  She denies associated unilateral numbness or weakness. Initial duration:  Usually 2-3 days (up to a week with various intensity) Initial frequency:  6 times a month (15 days or more a month) Initial frequency of abortive medication: something daily Triggers:  Lack of coffee, menstrual period, when low back pain aggravated, perfumes Relieving factors:  Ice pack, lay in dark Activity:  aggravates She has been to the ED on a couple of occasions for severe intractable migraine.  Vertigo: She reports dizziness beginning in 2024.  Describes it as a spinning sensation.  Occurs when she stands up or if she turns to either direction.  Lasts a few  seconds.  Occurs several times a week.    Bilateral leg cramps/left lumbar radiculopathy: She has been diagnosed with fibromyalgia.  Since 2024, she has had cramps in her calves.  She also has numbness down the lateral left leg and a sharp pain in the center of her left foot.  She has received injections in the past.  Reports falls.  Prior lumbar X-ray showed mild facet arthropathy.  Labs include ferritin of 6 with low iron, B12 458, vit D 38, Mg 2.3, and negative CRP.  Scheduled for iron infusions.     Past medications: Past NSAIDS:  Ibuprofen , Aleve Past analgesics:  Excedrin, Tylenol , Fioricet tramadol  Past abortive triptans:   Tosymra  NS (effective but no longer covered by insurance),rizatriptan  10mg  Past abortive ergotamine:  none Past muscle relaxants:  Flexeril  Past anti-emetic:  Phenergan  Past antihypertensive medications:  a blood pressure medication Past antidepressant medications:  Effexor, Trintellix, Rexulti Past anticonvulsant medications:  topiramate, gabapentin , lamotrigine Past anti-CGRP:  Aimovig  Past vitamins/Herbal/Supplements:  none Past antihistamines/decongestants:  none Other past therapies:  Botox  (effective)   She was told by her previous PCP that she had a transient ischemic attack in early 2019.  She was walking and her legs suddenly gave out and her arms weren't working.  She was on the floor for a few minutes.  No unilateral numbness or weakness.  When she got up, she called a friend who tested her on the phone for TIA and symptoms resolved.  She had an MRI and MRA of the brain about a month later, on 07/24/17, which report states were normal.  She was given a diagnosis of TIA and has since been on ASA 325mg  daily.  Her mother has a history of recurrent TIAs while she was treated for lung cancer.     Family history of headache:  no  PAST MEDICAL HISTORY: Past Medical History:  Diagnosis Date   Abnormal Pap smear of cervix    Allergy    Anxiety    Asthma     Bipolar 1 disorder (HCC)    Chronic fatigue    Chronic pain    CIN I (cervical intraepithelial neoplasia I) 03/03/2021   03/01/2021 - CIN I colpo, repeat cotesting in 1 year     Depression    Diabetes mellitus without complication (HCC)    Diabetes mellitus, type II (HCC)    Diverticulitis    Fibromyalgia    GERD (gastroesophageal reflux disease)    HPV in female    Hyperlipidemia    Hypertension    Migraines    Osteoarthritis    Ovarian cyst    right   PCOS (polycystic ovarian syndrome)    STD (sexually transmitted disease)    Trichomonas contact, treated    Vertigo     MEDICATIONS: Current Outpatient Medications on File Prior to Visit  Medication Sig Dispense Refill   botulinum toxin Type A  (BOTOX ) 200 units injection Inject 155 units IM into multiple site in the face,neck and head once every 90 days 1 each 4   buPROPion  (WELLBUTRIN  XL) 150 MG 24 hr tablet Take 1 tablet (150 mg total) by mouth daily for 14 days. 14 tablet 0   clonazePAM  (KLONOPIN ) 0.5 MG tablet Take 0.5 mg by mouth 2 (two) times daily.     fluconazole  (DIFLUCAN )  150 MG tablet Take 1 tablet (150 mg total) by mouth every three (3) days as needed. 2 tablet 0   HYDROcodone -acetaminophen  (NORCO) 10-325 MG tablet Take 1 tablet by mouth every 6 (six) hours as needed.     losartan  (COZAAR ) 50 MG tablet Take 1 tablet (50 mg total) by mouth daily. 90 tablet 1   meclizine  (ANTIVERT ) 25 MG tablet Take 1 tablet (25 mg total) by mouth 3 (three) times daily as needed for dizziness. 30 tablet 0   methocarbamol  (ROBAXIN ) 750 MG tablet Take 750 mg by mouth 3 (three) times daily as needed.     ondansetron  (ZOFRAN -ODT) 4 MG disintegrating tablet Take 4 mg by mouth every 6 (six) hours as needed.     pantoprazole  (PROTONIX ) 40 MG tablet Take 40 mg by mouth daily.     pregabalin  (LYRICA ) 200 MG capsule Take 1 capsule (200 mg total) by mouth 2 (two) times daily for 7 days. 14 capsule 0   QUEtiapine  (SEROQUEL ) 200 MG tablet Take 200  mg by mouth 2 (two) times daily.     rosuvastatin  (CRESTOR ) 5 MG tablet Take 5 mg by mouth daily.     SUMAtriptan  (IMITREX ) 100 MG tablet Take 100 mg by mouth every 2 (two) hours as needed for migraine.     tirzepatide  (MOUNJARO ) 12.5 MG/0.5ML Pen Inject 12.5 mg into the skin once a week. 6 mL 0   zolpidem  (AMBIEN ) 10 MG tablet Take 10 mg by mouth at bedtime.     No current facility-administered medications on file prior to visit.    ALLERGIES: Allergies  Allergen Reactions   Ciprofloxacin Itching   Wound Dressing Adhesive Other (See Comments)    Red skin, eats into skin, becomes bubbly    FAMILY HISTORY: Family History  Problem Relation Age of Onset   Stroke Mother    Heart disease Mother    Lung cancer Mother    Rheum arthritis Mother    Arthritis Mother    COPD Mother    Fibromyalgia Mother    Hypertension Father    Diabetes Father    Heart disease Father    Hodgkin's lymphoma Father    Drug abuse Sister    Anxiety disorder Sister    Endometriosis Sister    Drug abuse Brother        died of overdose   Breast cancer Maternal Aunt    Breast cancer Paternal Aunt    Diabetes Maternal Grandmother    Lung cancer Maternal Grandmother    Other Maternal Grandmother        pace maker   Skin cancer Paternal Grandmother       Objective:  Blood pressure 133/81, pulse 88, height 5' 2 (1.575 m), weight 170 lb (77.1 kg), last menstrual period 11/18/2023, SpO2 100%. General: No acute distress.  Patient appears well-groomed.   Head:  Normocephalic/atraumatic Eyes:  Fundi examined but not visualized Neck: supple, no paraspinal tenderness, full range of motion Heart:  Regular rate and rhythm Lungs:  Clear to auscultation bilaterally Neurological Exam: alert and oriented.  Speech fluent and not dysarthric, language intact.  CN II-XII intact. Bulk and tone normal, muscle strength 5/5 throughout.  Sensation to light touch intact.  Deep tendon reflexes 2+ throughout.  Finger to nose  testing intact.  Gait steady   Juliene Dunnings, DO  CC: Worth Kitty, MD

## 2023-12-03 NOTE — Telephone Encounter (Signed)
 Email received.  Good morning,     Procedure Date:  12/05/23  Patient Name:  Heidi Holland  Date of Birth:     05/11/1975  Campus/Location:  [facility]  Physician Name:   SKEET JULIENE SAUNDERS        Insurance Name:  [facility]  CPT Codes:   MR BRAIN W WO CONTRAST        Auth Issue/Follow-up Needed:  AUTH HAS EXPIRED  Due Diligence Completed:  Additional Notes:        Thanks,  Kendra Bannerman      MRI approval extended Rep Bari PARAS new dates 10/18/23-01/01/24. Same Auth # [id number]

## 2023-12-04 ENCOUNTER — Ambulatory Visit (INDEPENDENT_AMBULATORY_CARE_PROVIDER_SITE_OTHER): Admitting: Neurology

## 2023-12-04 ENCOUNTER — Encounter: Payer: Self-pay | Admitting: Neurology

## 2023-12-04 ENCOUNTER — Encounter (HOSPITAL_COMMUNITY): Payer: Self-pay | Admitting: *Deleted

## 2023-12-04 ENCOUNTER — Other Ambulatory Visit: Payer: Self-pay

## 2023-12-04 VITALS — BP 133/81 | HR 88 | Ht 62.0 in | Wt 170.0 lb

## 2023-12-04 DIAGNOSIS — H8113 Benign paroxysmal vertigo, bilateral: Secondary | ICD-10-CM

## 2023-12-04 DIAGNOSIS — G43009 Migraine without aura, not intractable, without status migrainosus: Secondary | ICD-10-CM

## 2023-12-04 NOTE — Progress Notes (Signed)
 PCP - Dr Worth Kitty Cardiologist - Madonna Large, DO Gastro - Delon Hendricks F[place], [place] Neuro - Juliene Dunnings, DO Oncology - Dr Chinita Pasam  Chest x-ray - n/a EKG - 11/21/23 Stress Test - 05/07/23 ECHO - n/a Cardiac Cath - n/a  ICD Pacemaker/Loop - n/a  Sleep Study -  Yes (2016) CPAP - none, hx gastric bypass surgery    Diabetes Type 2, does not check blood sugar. Do not take Mounjaro  for 7 days prior to procedure.  St dose was on 12/02/23. (SDW Call).    Aspirin & Blood Thinner Instructions:  n/a  NPO  Anesthesia review: Yes  STOP now taking any Aspirin (unless otherwise instructed by your surgeon), Aleve, Naproxen, Ibuprofen , Motrin , Advil , Goody's, BC's, all herbal medications, fish oil, and all vitamins.   Coronavirus Screening Do you have any of the following symptoms:  Cough yes/no: No Fever (>100.53F)  yes/no: No Runny nose yes/no: No Sore throat yes/no: No Difficulty breathing/shortness of breath  yes/no: No  Have you traveled in the last 14 days and where? yes/no: No  Patient verbalized understanding of instructions that were given via phone.

## 2023-12-04 NOTE — Progress Notes (Signed)
 Anesthesia Chart Review: Heidi Holland  Case: [id number] Date/Time: 12/05/23 0945   Procedure: MRI WITH ANESTHESIA - MRI BRAIN WITH AN WITHOUT CONTRAST   Anesthesia type: General   Pre-op diagnosis: HEADACHES,INCREASING FREQUENCY,OR SEVERITY VERTIGO   Location: [facility] RADIOLOGY RO[facility] / [facility] OR   Surgeons: Radiologist, Medication, MD       DISCUSSION: Patient is a 48 year old female scheduled for the above procedure. MRI was ordered by neurology for worsening headaches and dizziness. She had H&P visit per Dr. Skeet on 12/04/2023.   History includes never smoker, postoperative N/V, DM2, HTN, asthma, GERD, HLD, Bipolar 1 disorder, migraines, PCOS, fatty liver, OSA (no CPAP after gastric bypass/weight loss), diverticulitis, bowel resection, gastric bypass (2016), anemia (s/p IV iron July 2025), osteoarthritis (left THA).   Evaluated by cardiologist Dr.Tolia on 04/24/2023 for family history of early onset CAD. ETT and self pay CT cardiac scoring ordered. She had a low risk ETT on 05/07/2023. Coronary calcium  scoring study is still pending.    She was prescribed Augmentin  on 11/16/2023 (virtual visit) for sinus pressure, ear fullness, vertigo. ED visit 11/21/2023 for primarily pain/fibromyalgia exacerbation. Patient indicated she was being treated for suspected ear infection. Right ear was impacted with cerumen which was irrigated, but otherwise exam suggested no on-going AOM. Lungs clear.     PAT RN contacted patient on 12/04/2023.  Last Mounjaro  was on 12/02/2023. Case is posted for general anesthesia. A1c 5.3% on 11/05/2023.  Anesthesia team to evaluate on the day of surgery.   VS: Ht 5' 2 (1.575 m)   Wt 84.8 kg   LMP 11/18/2023 (Exact Date)   BMI 34.19 kg/m  BP Readings from Last 3 Encounters:  11/26/23 111/87  11/21/23 98/87  11/19/23 122/77   Pulse Readings from Last 3 Encounters:  11/26/23 82  11/21/23 79  11/19/23 82     PROVIDERS: Kennyth Worth HERO, MD his PCP Michele Richardson, DO is  cardiologist Skeet Cornet, DO is neurologist Autumn Millman, MD is CHERIE Justino Corners, PA-C is rheumatology provider.  Consultation 06/06/2023. ANA 1;40. No clinical picture concerning for lupus or CTD. Known polyarthralgia, osteoarthritis, and fibromyalgia. Additional xray imaging done. As needed follow-up.  Hope Norris, NP is pulmonology provider (for OSA). Visit on 09/13/2023. OSA diagnosed 2016 prior to gastric bypass and weight loss. Plan for future HST to re-evaluate. Eda Iha, MD is GI   LABS: Most recent lab results in [facility] include: Lab Results  Component Value Date   WBC 7.6 11/21/2023   HGB 12.9 11/21/2023   HCT 39.0 11/21/2023   PLT 264 11/21/2023   GLUCOSE 83 11/21/2023   CHOL 113 10/21/2023   TRIG 185 (H) 10/21/2023   HDL 38 (L) 10/21/2023   LDLCALC 38 10/21/2023   ALT 13 11/21/2023   AST 25 11/21/2023   NA 136 11/21/2023   K 4.6 11/21/2023   CL 99 11/21/2023   CREATININE 0.67 11/21/2023   BUN 15 11/21/2023   CO2 23 11/21/2023   TSH 1.755 10/17/2023   HGBA1C 5.3 10/21/2023     IMAGES: CT Abd/pelvis 11/21/2023: IMPRESSION: 1. No evidence of bowel obstruction or inflammation. 2. Small periumbilical hernia containing fat with possible fat necrosis. 3. Aortic atherosclerosis.   EKG: 11/21/2023: Sinus rhythm Low voltage, precordial leads Confirmed by Patsey Lot [id number]) on 11/22/2023 3:28:10 PM   CV: ETT 05/07/2023:   No ST deviation was noted.   Patient exercised for 6 minutes and 18 seconds.  Normal blood pressure response.  No ST segment changes.  Low risk exercise treadmill test with no electrocardiographic evidence of ischemia.  Past Medical History:  Diagnosis Date   Abnormal Pap smear of cervix    Allergy    Anxiety    Asthma    Bipolar 1 disorder (HCC)    Chronic fatigue    Chronic pain    CIN I (cervical intraepithelial neoplasia I) 03/03/2021   03/01/2021 - CIN I colpo, repeat cotesting in 1 year     Depression     Diabetes mellitus without complication (HCC)    Diabetes mellitus, type II (HCC)    Diverticulitis    Fatty liver    per pt on 12/04/23   Fibromyalgia    GERD (gastroesophageal reflux disease)    HPV in female    Hyperlipidemia    Hypertension    Migraines    Osteoarthritis    Ovarian cyst    right   PCOS (polycystic ovarian syndrome)    PONV (postoperative nausea and vomiting)    Sleep apnea 2016   hx gastric bypass surgery   STD (sexually transmitted disease)    Trichomonas contact, treated    Vertigo     Past Surgical History:  Procedure Laterality Date   BOWEL RESECTION     COLONOSCOPY  05/23/2022   COLPOSCOPY W/ BIOPSY / CURETTAGE  11/26/2023   -needs LEEP procedure per pt on 12/04/23   GASTRIC BYPASS  2016   KNEE ARTHROSCOPY Bilateral    SHOULDER SURGERY Left    TOTAL HIP ARTHROPLASTY Left    UPPER GI ENDOSCOPY  05/23/2022    MEDICATIONS: No current facility-administered medications for this encounter.    albuterol  (VENTOLIN  HFA) 108 (90 Base) MCG/ACT inhaler   loratadine (CLARITIN) 10 MG tablet   lumateperone  tosylate (CAPLYTA ) 42 MG capsule   botulinum toxin Type A  (BOTOX ) 200 units injection   buPROPion  (WELLBUTRIN  XL) 150 MG 24 hr tablet   clonazePAM  (KLONOPIN ) 0.5 MG tablet   fluconazole  (DIFLUCAN ) 150 MG tablet   HYDROcodone -acetaminophen  (NORCO) 10-325 MG tablet   losartan  (COZAAR ) 50 MG tablet   meclizine  (ANTIVERT ) 25 MG tablet   methocarbamol  (ROBAXIN ) 750 MG tablet   ondansetron  (ZOFRAN -ODT) 4 MG disintegrating tablet   pantoprazole  (PROTONIX ) 40 MG tablet   pregabalin  (LYRICA ) 200 MG capsule   QUEtiapine  (SEROQUEL ) 200 MG tablet   rosuvastatin  (CRESTOR ) 5 MG tablet   SUMAtriptan  (IMITREX ) 100 MG tablet   tirzepatide  (MOUNJARO ) 12.5 MG/0.5ML Pen   zolpidem  (AMBIEN ) 10 MG tablet    Isaiah Ruder, PA-C Surgical Short Stay/Anesthesiology [facility] Phone [phone] [facility] Phone [phone] 12/04/2023 4:16 PM

## 2023-12-04 NOTE — Patient Instructions (Signed)
 MRI of brain tomorrow Botox  Sumatriptan  as needed. Limit use of pain relievers to no more than 9 days out of the month to prevent risk of rebound or medication-overuse headache. Keep headache diary Follow up in 6 months.

## 2023-12-04 NOTE — Anesthesia Preprocedure Evaluation (Signed)
 Anesthesia Evaluation  Patient identified by MRN, date of birth, ID band Patient awake    Reviewed: Allergy & Precautions, NPO status , Patient's Chart, lab work & pertinent test results  History of Anesthesia Complications (+) PONV and history of anesthetic complications  Airway Mallampati: II  TM Distance: >3 FB Neck ROM: Full    Dental  (+) Teeth Intact, Dental Advisory Given   Pulmonary asthma    breath sounds clear to auscultation       Cardiovascular hypertension, Pt. on medications  Rhythm:Regular     Neuro/Psych  Headaches PSYCHIATRIC DISORDERS Anxiety Depression Bipolar Disorder    Neuromuscular disease    GI/Hepatic Neg liver ROS,GERD  Medicated and Controlled,,  Endo/Other  diabetes, Type 2    Renal/GU negative Renal ROS     Musculoskeletal  (+) Arthritis ,  Fibromyalgia -  Abdominal   Peds  Hematology  (+) Blood dyscrasia, anemia Lab Results      Component                Value               Date                      WBC                      7.6                 11/21/2023                HGB                      12.9                11/21/2023                HCT                      39.0                11/21/2023                MCV                      87.6                11/21/2023                PLT                      264                 11/21/2023              Anesthesia Other Findings   Reproductive/Obstetrics                              Anesthesia Physical Anesthesia Plan  ASA: 2  Anesthesia Plan: General   Post-op Pain Management:    Induction: Intravenous, Cricoid pressure planned and Rapid sequence  PONV Risk Score and Plan: 4 or greater and Ondansetron  and Dexamethasone   Airway Management Planned: Oral ETT  Additional Equipment: None  Intra-op Plan:   Post-operative Plan: Extubation in OR  Informed Consent: I have reviewed the patients History and  Physical, chart, labs and discussed the  procedure including the risks, benefits and alternatives for the proposed anesthesia with the patient or authorized representative who has indicated his/her understanding and acceptance.     Dental advisory given  Plan Discussed with: CRNA  Anesthesia Plan Comments: (PAT note written 12/04/2023 by Laxmi Choung, PA-C.  )         Anesthesia Quick Evaluation

## 2023-12-05 ENCOUNTER — Other Ambulatory Visit: Payer: Self-pay

## 2023-12-05 ENCOUNTER — Ambulatory Visit (HOSPITAL_COMMUNITY)
Admission: RE | Admit: 2023-12-05 | Discharge: 2023-12-05 | Disposition: A | Source: Ambulatory Visit | Attending: Family Medicine | Admitting: Family Medicine

## 2023-12-05 ENCOUNTER — Ambulatory Visit (HOSPITAL_COMMUNITY): Payer: Self-pay | Admitting: Vascular Surgery

## 2023-12-05 ENCOUNTER — Ambulatory Visit (HOSPITAL_COMMUNITY)
Admission: RE | Admit: 2023-12-05 | Discharge: 2023-12-05 | Disposition: A | Discharge status: Home / Self Care | Source: Ambulatory Visit | Attending: Neurology | Admitting: Neurology

## 2023-12-05 ENCOUNTER — Ambulatory Visit (HOSPITAL_BASED_OUTPATIENT_CLINIC_OR_DEPARTMENT_OTHER): Payer: Self-pay | Admitting: Vascular Surgery

## 2023-12-05 ENCOUNTER — Ambulatory Visit (HOSPITAL_COMMUNITY)
Admission: RE | Admit: 2023-12-05 | Discharge: 2023-12-05 | Disposition: A | Source: Ambulatory Visit | Attending: Neurology | Admitting: Neurology

## 2023-12-05 ENCOUNTER — Encounter (HOSPITAL_COMMUNITY): Payer: Self-pay | Admitting: Neurology

## 2023-12-05 ENCOUNTER — Encounter: Admission: RE | Disposition: A | Payer: Self-pay | Attending: Neurology

## 2023-12-05 DIAGNOSIS — Z9884 Bariatric surgery status: Secondary | ICD-10-CM | POA: Diagnosis not present

## 2023-12-05 DIAGNOSIS — H8113 Benign paroxysmal vertigo, bilateral: Secondary | ICD-10-CM

## 2023-12-05 DIAGNOSIS — Z7985 Long-term (current) use of injectable non-insulin antidiabetic drugs: Secondary | ICD-10-CM | POA: Insufficient documentation

## 2023-12-05 DIAGNOSIS — J984 Other disorders of lung: Secondary | ICD-10-CM

## 2023-12-05 DIAGNOSIS — M546 Pain in thoracic spine: Secondary | ICD-10-CM

## 2023-12-05 DIAGNOSIS — R519 Headache, unspecified: Secondary | ICD-10-CM

## 2023-12-05 DIAGNOSIS — I10 Essential (primary) hypertension: Secondary | ICD-10-CM | POA: Insufficient documentation

## 2023-12-05 DIAGNOSIS — E119 Type 2 diabetes mellitus without complications: Secondary | ICD-10-CM | POA: Diagnosis not present

## 2023-12-05 DIAGNOSIS — E785 Hyperlipidemia, unspecified: Secondary | ICD-10-CM | POA: Diagnosis not present

## 2023-12-05 DIAGNOSIS — Z96642 Presence of left artificial hip joint: Secondary | ICD-10-CM | POA: Diagnosis not present

## 2023-12-05 DIAGNOSIS — G43709 Chronic migraine without aura, not intractable, without status migrainosus: Secondary | ICD-10-CM | POA: Diagnosis present

## 2023-12-05 DIAGNOSIS — K219 Gastro-esophageal reflux disease without esophagitis: Secondary | ICD-10-CM | POA: Diagnosis not present

## 2023-12-05 DIAGNOSIS — F319 Bipolar disorder, unspecified: Secondary | ICD-10-CM | POA: Insufficient documentation

## 2023-12-05 DIAGNOSIS — D649 Anemia, unspecified: Secondary | ICD-10-CM | POA: Insufficient documentation

## 2023-12-05 HISTORY — PX: RADIOLOGY WITH ANESTHESIA: SHX6223

## 2023-12-05 HISTORY — DX: Nausea with vomiting, unspecified: R11.2

## 2023-12-05 HISTORY — DX: Fatty (change of) liver, not elsewhere classified: K76.0

## 2023-12-05 LAB — GLUCOSE, CAPILLARY
Glucose-Capillary: 130 mg/dL — ABNORMAL HIGH (ref 70–99)
Glucose-Capillary: 93 mg/dL (ref 70–99)

## 2023-12-05 LAB — POCT PREGNANCY, URINE: Preg Test, Ur: NEGATIVE

## 2023-12-05 SURGERY — MRI WITH ANESTHESIA
Anesthesia: General

## 2023-12-05 MED ORDER — ROCURONIUM BROMIDE 10 MG/ML (PF) SYRINGE
PREFILLED_SYRINGE | INTRAVENOUS | Status: DC | PRN
  Administered 2023-12-05: 50 mg via INTRAVENOUS

## 2023-12-05 MED ORDER — MIDAZOLAM HCL 2 MG/2ML IJ SOLN
INTRAMUSCULAR | Status: AC
  Filled 2023-12-05: qty 4

## 2023-12-05 MED ORDER — AMISULPRIDE (ANTIEMETIC) 5 MG/2ML IV SOLN
INTRAVENOUS | Status: AC
  Filled 2023-12-05: qty 2

## 2023-12-05 MED ORDER — ONDANSETRON HCL 4 MG/2ML IJ SOLN
INTRAMUSCULAR | Status: DC | PRN
  Administered 2023-12-05: 4 mg via INTRAVENOUS

## 2023-12-05 MED ORDER — ALBUTEROL SULFATE HFA 108 (90 BASE) MCG/ACT IN AERS
INHALATION_SPRAY | RESPIRATORY_TRACT | Status: DC | PRN
  Administered 2023-12-05 (×2): 2 via RESPIRATORY_TRACT

## 2023-12-05 MED ORDER — ORAL CARE MOUTH RINSE: 15.0000 mL | Freq: Once | OROMUCOSAL | Status: AC

## 2023-12-05 MED ORDER — MIDAZOLAM HCL (PF) 2 MG/2ML IJ SOLN
INTRAMUSCULAR | Status: DC | PRN
  Administered 2023-12-05: 1 mg via INTRAVENOUS
  Administered 2023-12-05: 2 mg via INTRAVENOUS
  Administered 2023-12-05: 1 mg via INTRAVENOUS
  Administered 2023-12-05: 2 mg via INTRAVENOUS

## 2023-12-05 MED ORDER — DEXAMETHASONE SOD PHOSPHATE PF 10 MG/ML IJ SOLN
INTRAMUSCULAR | Status: DC | PRN
  Administered 2023-12-05: 5 mg via INTRAVENOUS

## 2023-12-05 MED ORDER — AMISULPRIDE (ANTIEMETIC) 5 MG/2ML IV SOLN
10.0000 mg | Freq: Once | INTRAVENOUS | Status: AC
  Administered 2023-12-05: 10 mg via INTRAVENOUS

## 2023-12-05 MED ORDER — ONDANSETRON HCL 4 MG/2ML IJ SOLN
4.0000 mg | Freq: Once | INTRAMUSCULAR | Status: AC
  Administered 2023-12-05: 4 mg via INTRAVENOUS

## 2023-12-05 MED ORDER — GADOBUTROL 1 MMOL/ML IV SOLN
7.7000 mL | Freq: Once | INTRAVENOUS | Status: AC | PRN
  Administered 2023-12-05: 7.7 mL via INTRAVENOUS

## 2023-12-05 MED ORDER — LIDOCAINE 2% (20 MG/ML) 5 ML SYRINGE
INTRAMUSCULAR | Status: DC | PRN
  Administered 2023-12-05: 60 mg via INTRAVENOUS

## 2023-12-05 MED ORDER — PHENYLEPHRINE HCL-NACL 20-0.9 MG/250ML-% IV SOLN
INTRAVENOUS | Status: DC | PRN
  Administered 2023-12-05: 40 ug/min via INTRAVENOUS

## 2023-12-05 MED ORDER — EPHEDRINE SULFATE-NACL 50-0.9 MG/10ML-% IV SOSY
PREFILLED_SYRINGE | INTRAVENOUS | Status: DC | PRN
  Administered 2023-12-05: 10 mg via INTRAVENOUS
  Administered 2023-12-05: 5 mg via INTRAVENOUS

## 2023-12-05 MED ORDER — CHLORHEXIDINE GLUCONATE 0.12 % MT SOLN
15.0000 mL | Freq: Once | OROMUCOSAL | Status: AC
  Administered 2023-12-05: 15 mL via OROMUCOSAL
  Filled 2023-12-05: qty 15

## 2023-12-05 MED ORDER — PHENYLEPHRINE 80 MCG/ML (10ML) SYRINGE FOR IV PUSH (FOR BLOOD PRESSURE SUPPORT)
PREFILLED_SYRINGE | INTRAVENOUS | Status: DC | PRN
  Administered 2023-12-05 (×2): 160 ug via INTRAVENOUS

## 2023-12-05 MED ORDER — LACTATED RINGERS IV SOLN: INTRAVENOUS | Status: DC

## 2023-12-05 MED ORDER — FENTANYL CITRATE (PF) 100 MCG/2ML IJ SOLN
INTRAMUSCULAR | Status: DC | PRN
  Administered 2023-12-05 (×2): 50 ug via INTRAVENOUS
  Administered 2023-12-05: 25 ug via INTRAVENOUS

## 2023-12-05 MED ORDER — SUCCINYLCHOLINE CHLORIDE 200 MG/10ML IV SOSY
PREFILLED_SYRINGE | INTRAVENOUS | Status: DC | PRN
  Administered 2023-12-05: 140 mg via INTRAVENOUS

## 2023-12-05 MED ORDER — INSULIN ASPART 100 UNIT/ML IJ SOLN: 0.0000 [IU] | INTRAMUSCULAR | Status: DC | PRN

## 2023-12-05 MED ORDER — SUGAMMADEX SODIUM 200 MG/2ML IV SOLN
INTRAVENOUS | Status: DC | PRN
  Administered 2023-12-05 (×2): 200 mg via INTRAVENOUS

## 2023-12-05 MED ORDER — PROPOFOL 10 MG/ML IV BOLUS
INTRAVENOUS | Status: DC | PRN
Start: 2023-12-05 — End: 2023-12-05
  Administered 2023-12-05: 150 mg via INTRAVENOUS
  Administered 2023-12-05: 50 mg via INTRAVENOUS

## 2023-12-05 MED ORDER — ONDANSETRON HCL 4 MG/2ML IJ SOLN
INTRAMUSCULAR | Status: AC
Start: 2023-12-05 — End: 2023-12-05
  Filled 2023-12-05: qty 2

## 2023-12-05 NOTE — Anesthesia Procedure Notes (Signed)
 Procedure Name: Intubation Date/Time: 12/05/2023 10:58 AM  Performed by: Mollie Olivia SAUNDERS, CRNAPre-anesthesia Checklist: Patient identified, Emergency Drugs available, Suction available and Patient being monitored Patient Re-evaluated:Patient Re-evaluated prior to induction Oxygen Delivery Method: Circle System Utilized Preoxygenation: Pre-oxygenation with 100% oxygen Induction Type: IV induction, Cricoid Pressure applied and Rapid sequence Ventilation: Mask ventilation without difficulty Laryngoscope Size: Glidescope, Mac and 3 Grade View: Grade III Tube type: Oral Tube size: 7.0 mm Number of attempts: 2 Airway Equipment and Method: Stylet and Video-laryngoscopy Placement Confirmation: ETT inserted through vocal cords under direct vision, positive ETCO2 and breath sounds checked- equal and bilateral Secured at: 22 cm Tube secured with: Tape Dental Injury: Teeth and Oropharynx as per pre-operative assessment  Difficulty Due To: Difficult Airway- due to limited oral opening, Difficult Airway- due to large tongue, Difficulty was anticipated and Difficult Airway- due to anterior larynx Future Recommendations: Recommend- induction with short-acting agent, and alternative techniques readily available Comments: Crna x2 attempts ; 1st was MRI MAC 3 and very limited view--blind insertion was s ETCO2; 2nd: Glide go at [facility] and 3 used still limited opening and sharp teeth limiting advacement of ETT c ease. Had to maneuver easily c cricoid pressure to achieve successful intubation. Pt lowest Spo2 was 80's and bagged her to raise it to 100% before second attempt; mri doesn't carry over all the minute by minute details

## 2023-12-05 NOTE — H&P (Signed)
 Please see H&P form

## 2023-12-05 NOTE — Transfer of Care (Signed)
 Immediate Anesthesia Transfer of Care Note  [name]  Procedure(s) Performed: MRI WITH ANESTHESIA  Patient Location: PACU  Anesthesia Type:General  Level of Consciousness: awake, alert , and oriented  Airway & Oxygen Therapy: Patient Spontanous Breathing and Patient connected to nasal cannula oxygen  Post-op Assessment: Report given to RN, Post -op Vital signs reviewed and stable, and Patient moving all extremities X 4  Post vital signs: Reviewed and stable  Last Vitals:  Vitals Value Taken Time  BP 145/89 12/05/23 12:45  Temp    Pulse 90 12/05/23 12:55  Resp 24 12/05/23 12:55  SpO2 98 % 12/05/23 12:55  Vitals shown include unfiled device data.  Last Pain:  Vitals:   12/05/23 1245  TempSrc:   PainSc: 0-No pain      Patients Stated Pain Goal: 5 (12/05/23 0828)  Complications: No notable events documented.

## 2023-12-05 NOTE — Anesthesia Postprocedure Evaluation (Signed)
 Anesthesia Post Note  [name]  Procedure(s) Performed: MRI WITH ANESTHESIA     Patient location during evaluation: PACU Anesthesia Type: General Level of consciousness: awake and alert and oriented Pain management: pain level controlled Vital Signs Assessment: post-procedure vital signs reviewed and stable Respiratory status: spontaneous breathing, nonlabored ventilation and respiratory function stable Cardiovascular status: blood pressure returned to baseline and stable Postop Assessment: no apparent nausea or vomiting Anesthetic complications: no   No notable events documented.  Last Vitals:  Vitals:   12/05/23 1245 12/05/23 1300  BP: (!) 145/89 (!) 137/94  Pulse: 100 90  Resp: 20 20  Temp:    SpO2: 96% 98%    Last Pain:  Vitals:   12/05/23 1300  TempSrc:   PainSc: 0-No pain                 Nikkie Liming A.

## 2023-12-06 ENCOUNTER — Encounter (HOSPITAL_COMMUNITY): Payer: Self-pay | Admitting: Radiology

## 2023-12-06 ENCOUNTER — Telehealth: Payer: Self-pay

## 2023-12-06 ENCOUNTER — Other Ambulatory Visit: Payer: Self-pay | Admitting: Neurology

## 2023-12-06 ENCOUNTER — Ambulatory Visit: Payer: Self-pay | Admitting: Neurology

## 2023-12-06 DIAGNOSIS — B9689 Other specified bacterial agents as the cause of diseases classified elsewhere: Secondary | ICD-10-CM

## 2023-12-06 LAB — GLUCOSE, CAPILLARY: Glucose-Capillary: 90 mg/dL (ref 70–99)

## 2023-12-06 MED ORDER — MECLIZINE HCL 25 MG PO TABS: 25.0000 mg | ORAL_TABLET | Freq: Three times a day (TID) | ORAL | 1 refills | Status: AC | PRN

## 2023-12-06 NOTE — Progress Notes (Signed)
 Patient advised of her results.

## 2023-12-06 NOTE — Telephone Encounter (Signed)
 Per patient she been feeling a little dizzy can she get refills on her Meclizine .

## 2023-12-06 NOTE — Telephone Encounter (Signed)
 Patient advised of Dr.Jaffe, I sent in refill for meclizine  to [facility]. However, if the dizziness is increasing, I would recommend going back to vestibular rehab as that is the preferred treatment. Meclizine  shouldn't be used frequently/chronically.   Patient will call to schedule for rehab.

## 2023-12-07 ENCOUNTER — Other Ambulatory Visit: Payer: Self-pay | Admitting: Family Medicine

## 2023-12-09 ENCOUNTER — Ambulatory Visit: Admitting: Family Medicine

## 2023-12-09 ENCOUNTER — Encounter: Payer: Self-pay | Admitting: Family Medicine

## 2023-12-09 VITALS — BP 118/78 | HR 82 | Temp 97.3°F | Ht 62.0 in | Wt 177.2 lb

## 2023-12-09 DIAGNOSIS — J4520 Mild intermittent asthma, uncomplicated: Secondary | ICD-10-CM | POA: Diagnosis not present

## 2023-12-09 DIAGNOSIS — R87619 Unspecified abnormal cytological findings in specimens from cervix uteri: Secondary | ICD-10-CM | POA: Diagnosis not present

## 2023-12-09 DIAGNOSIS — J309 Allergic rhinitis, unspecified: Secondary | ICD-10-CM | POA: Diagnosis not present

## 2023-12-09 MED ORDER — PREDNISONE 50 MG PO TABS: ORAL_TABLET | ORAL | 0 refills | Status: DC

## 2023-12-09 MED ORDER — ALBUTEROL SULFATE HFA 108 (90 BASE) MCG/ACT IN AERS: 2.0000 | INHALATION_SPRAY | Freq: Four times a day (QID) | RESPIRATORY_TRACT | 0 refills | Status: AC | PRN

## 2023-12-09 MED ORDER — AMOXICILLIN 500 MG PO CAPS: 1000.0000 mg | ORAL_CAPSULE | Freq: Three times a day (TID) | ORAL | 0 refills | Status: AC

## 2023-12-09 MED ORDER — ATROVENT HFA 17 MCG/ACT IN AERS: 2.0000 | INHALATION_SPRAY | Freq: Four times a day (QID) | RESPIRATORY_TRACT | 0 refills | Status: DC | PRN

## 2023-12-09 MED ORDER — FLUCONAZOLE 150 MG PO TABS: 150.0000 mg | ORAL_TABLET | ORAL | 0 refills | Status: DC | PRN

## 2023-12-09 MED ORDER — DOXYCYCLINE HYCLATE 100 MG PO TABS: 100.0000 mg | ORAL_TABLET | Freq: Two times a day (BID) | ORAL | 0 refills | Status: DC

## 2023-12-09 NOTE — Patient Instructions (Signed)
 It was very nice to see you today!  VISIT SUMMARY: During your visit, we addressed your pneumonia, foot pain, fibromyalgia, ear discomfort, and recent cervical dysplasia findings. We have prescribed medications and provided instructions for follow-up care.  YOUR PLAN: PNEUMONIA: You have pneumonia, which is causing difficulty breathing, chills, and night sweats. -Take amoxicillin  and doxycycline  as prescribed. -Take Diflucan  to prevent a yeast infection. -Refill and use your albuterol  inhaler as needed. -Report if your symptoms do not improve by the end of the week.  SESAMOIDITIS: You have inflammation in the bones of your feet, which is contributing to your pain. -Take prednisone  to reduce inflammation and assist with pneumonia treatment. -Follow up with sports medicine for further evaluation and potential injection therapy.  FIBROMYALGIA: Your chronic condition is causing widespread pain and fatigue, which may be worsened by your pneumonia and foot pain. -Monitor for improvement following pneumonia treatment.  EAR WAX IMPACTION WITH INFLAMMATION: You have ear wax buildup and inflammation, likely related to a sinus infection. -Take antibiotics for the sinus infection. -Switch your nasal spray to Atrovent .  CERVICAL DYSPLASIA: Your recent Pap smear showed abnormal cells, and a colposcopy found two lesions on your cervix. -You are scheduled for a LEEP procedure to address the lesions.  GENERAL HEALTH MAINTENANCE: You are participating in a DNA study and a clinical trial, and recently had a psychiatric evaluation. -Obtain a copy of your DNA test results for your medical records. -Continue participating in the clinical trial.  FOLLOW-UP: You have multiple ongoing health issues that need close monitoring. -Send a message later in the week to update on your health status.  Return if symptoms worsen or fail to improve.   Take care, Dr Kennyth  PLEASE NOTE:  If you had any lab tests,  please let us  know if you have not heard back within a few days. You may see your results on mychart before we have a chance to review them but we will give you a call once they are reviewed by us .   If we ordered any referrals today, please let us  know if you have not heard from their office within the next week.   If you had any urgent prescriptions sent in today, please check with the pharmacy within an hour of our visit to make sure the prescription was transmitted appropriately.   Please try these tips to maintain a healthy lifestyle:  Eat at least 3 REAL meals and 1-2 snacks per day.  Aim for no more than 5 hours between eating.  If you eat breakfast, please do so within one hour of getting up.   Each meal should contain half fruits/vegetables, one quarter protein, and one quarter carbs (no bigger than a computer mouse)  Cut down on sweet beverages. This includes juice, soda, and sweet tea.   Drink at least 1 glass of water with each meal and aim for at least 8 glasses per day  Exercise at least 150 minutes every week.

## 2023-12-09 NOTE — Progress Notes (Signed)
 Heidi Holland is a 48 y.o. female who presents today for an office visit.  Assessment/Plan:  New/Acute Problems: CAP Reviewed recent MRI which showed frequent lung disease consistent with pneumonia.  She is additionally having other symptoms including malaise, pleuritic chest pain and shortness of breath.  Her exam today is overall reassuring without any signs of respiratory distress.  Will treat as community-acquired pneumonia and start a course of amoxicillin  and doxycycline .  Will also start prednisone  burst.  We are treating her asthma as below.  She will let us  know if not improving in the next few days.  Cerumen Impaction Patient was noted to have cerumen impaction in the ED a couple weeks ago.  This is clear that she does have some slight middle ear effusion and likely has underlying sinusitis.  This should improve with the above treatment for her community-acquired pneumonia.  She can use over-the-counter Debrox or hydrogen peroxide as needed in the future to prevent  cerumen buildup.  Sesamoiditis Also reviewed her recent foot x-ray which showed sesamoiditis.  She will follow-up with sports medicine soon and hopefully should have improvement with course of prednisone  as above  Chronic Problems Addressed Today: Abnormal Pap smear of cervix Following with gynecology.  Recent Pap show dysplasia and colposcopy showed 2 high-grade squamous intraepithelial lesions.  She will be having LEEP procedure soon.  Mild intermittent asthma, uncomplicated Patient with mild flare related to her above pneumonia.  Will refill her albuterol  and start prednisone  burst as above.  Allergic rhinitis May be contributing to sinusitis. She is on Astelin  but does not like the taste of this.  Will try Atrovent  to see if this is better tolerated.     Subjective:  HPI:  See assessment / plan for status of chronic conditions.  Patient is here today for follow-up.  I last saw her a few months ago.  Since her  last visit, she has been working with sports medicine, pain management, and neurology for pain and migraine.   Discussed the use of AI scribe software for clinical note transcription with the patient, who gave verbal consent to proceed.  History of Present Illness Heidi Holland is a 48 year old female with fibromyalgia who presents with pneumonia and foot pain.  She recently visited the ER due to foot pain, where an x-ray revealed inflammation in the bones of her feet. She has been trying to see sports medicine but had to reschedule due to other health issues. She describes feeling run down and attributes some of her symptoms to her fibromyalgia.  She underwent two MRIs recently, during which her oxygen levels dropped, necessitating emergency intervention. The patient reports that the MRIs did not reveal any new issues, but she was told that pneumonia was identified during the imaging. She has difficulty taking deep breaths and uses her inhaler frequently. She experiences chills and night sweats, and takes Vicodin and Tylenol  regularly, which may be masking fevers.  She reports ear discomfort, previously treated with ear irrigation due to wax buildup, resulting in scabs in the ear canal. She continues to experience ear pain and suspects a sinus infection. She uses a nasal spray for sinus issues, although she finds the taste unpleasant.  Her fibromyalgia exacerbates her pain and fatigue, with flares significantly impacting her ability to work, currently limited to one day a week. She also reports abdominal pain and left-sided pain, which she suspects may be related to her lung issues.  She recently had a Pap smear that  showed squamous cells, leading to a colposcopy that identified two lesions on the cervix. She reports that she is scheduled for a LEEP procedure following the findings from her colposcopy.  Her social history includes moving in with her sister due to physical limitations and dealing  with the stress of integrating her pet into her sister's household.         Objective:  Physical Exam: BP 118/78   Pulse 82   Temp (!) 97.3 F (36.3 C) (Temporal)   Ht 5' 2 (1.575 m)   Wt 177 lb 3.2 oz (80.4 kg)   LMP 11/18/2023 (Exact Date)   SpO2 97%   BMI 32.41 kg/m   Gen: No acute distress, resting comfortably HEENT: TMs with clear effusion. CV: Regular rate and rhythm with no murmurs appreciated Pulm: Normal work of breathing, clear to auscultation bilaterally with no crackles, wheezes, or rhonchi Neuro: Grossly normal, moves all extremities Psych: Normal affect and thought content  Time Spent: 40 minutes of total time was spent on the date of the encounter performing the following actions: chart review prior to seeing the patient including her recent hospitalizations, obtaining history, performing a medically necessary exam, counseling on the treatment plan, placing orders, and documenting in our EHR.        Worth HERO. Kennyth, MD 12/09/2023 8:09 AM

## 2023-12-09 NOTE — Assessment & Plan Note (Signed)
 Following with gynecology.  Recent Pap show dysplasia and colposcopy showed 2 high-grade squamous intraepithelial lesions.  She will be having LEEP procedure soon.

## 2023-12-09 NOTE — Assessment & Plan Note (Signed)
 Patient with mild flare related to her above pneumonia.  Will refill her albuterol  and start prednisone  burst as above.

## 2023-12-09 NOTE — Assessment & Plan Note (Signed)
 May be contributing to sinusitis. She is on Astelin  but does not like the taste of this.  Will try Atrovent  to see if this is better tolerated.

## 2023-12-10 DIAGNOSIS — F411 Generalized anxiety disorder: Secondary | ICD-10-CM | POA: Diagnosis not present

## 2023-12-10 DIAGNOSIS — F4310 Post-traumatic stress disorder, unspecified: Secondary | ICD-10-CM | POA: Diagnosis not present

## 2023-12-10 MED ORDER — PROMETHAZINE HCL 25 MG PO TABS: 25.0000 mg | ORAL_TABLET | Freq: Three times a day (TID) | ORAL | 0 refills | Status: DC | PRN

## 2023-12-11 ENCOUNTER — Other Ambulatory Visit

## 2023-12-11 ENCOUNTER — Ambulatory Visit: Admitting: Family Medicine

## 2023-12-11 NOTE — Psych (Signed)
 [facility] BH PHP THERAPIST PROGRESS NOTE  Heidi Holland [id number]   Session Time: 9:00 am - 10:00 am  Participation Level: Active  Behavioral Response: CasualAlertAnxious and Depressed  Type of Therapy: Group Therapy  Treatment Goals addressed: Coping  Progress Towards Goals: Initial  Interventions: CBT, DBT, Solution Focused, Strength-based, Supportive, and Reframing  Therapist Response: Clinician led check-in regarding current stressors and situation, and review of patient completed daily inventory. Clinician utilized active listening and empathetic response and validated patient emotions. Clinician facilitated processing group on pertinent issues.?   Summary: Patient arrived within time allowed. Patient rates their depression at a 3 and anxiety at a 3 on a scale of 1-10 with 10 being highest. Pt reports she slept 8 hours last night and ate 4x. Pt reports struggling with hopelessness last night after struggling with pain and vertigo. Pt reports attempting distractions and tried multiple strategies. Pt reports wanting to allow herself to feel her feelings. Pt able to process.?Pt engaged in discussion.?      Session Time: 10:00 am - 11:00 am  Participation Level: Active  Behavioral Response: CasualAlertAnxious and Depressed  Type of Therapy: Group Therapy  Treatment Goals addressed: Coping  Progress Towards Goals: Initial  Interventions: CBT, DBT, Solution Focused, Strength-based, Supportive, and Reframing  Therapist Response: Cln led discussion on communicating our struggles with our support team. Cln introduced the concept of giving disclosures to the people close to us  regarding the way we act in specific situations or the way we process certain stimuli. Group members discussed ways to provide this road map to our brain and in what situations this may be helpful to them.  Summary: Pt engaged in discussion and is able to determine situations in which providing a disclosure  can be helpful and practiced doing so.      Session Time: 11:00 am - 12:00 pm  Participation Level: Active  Behavioral Response: CasualAlertAnxious  Type of Therapy: Group Therapy  Treatment Goals addressed: Coping  Progress Towards Goals: Initial  Interventions: CBT, DBT, Solution Focused, Strength-based, Supportive, and Reframing  Therapist Response: Cln continued topic of boundaries. Cln discussed the different ways boundaries present: physical, emotional, intellectual, sexual, material, and time. Group talked about the ways in which each type presents for them and is a struggle.   Summary:  Pt engaged in discussion and identified struggles with each type.        Session Time: 12:00 pm - 12:30 pm  Participation Level: Active  Behavioral Response: CasualAlertAnxious  Type of Therapy: Group Therapy  Treatment Goals addressed: Coping  Progress Towards Goals: Initial  Interventions: Psychologist, Occupational, Supportive  Therapist Response: Reflection Group: Patients encouraged to practice skills and interpersonal techniques or work on mindfulness and relaxation techniques. The importance of self-care and making skills part of a routine to increase usage were stressed.  Summary: Patient engaged and participated appropriately.      Session Time: 12:30 pm - 1:30 pm  Participation Level: Active  Behavioral Response: CasualAlertAnxious  Type of Therapy: Group Therapy  Treatment Goals addressed: Coping  Progress Towards Goals: Initial  Interventions: OT group  Therapist Response: Group was led by occupational therapist, Edward Hollan.   Summary: Pt engaged and participated in discussion.      Session Time: 1:30 pm - 2:00 pm  Participation Level: Active  Behavioral Response: CasualAlertAnxious  Type of Therapy: Group Therapy  Treatment Goals addressed: Coping  Progress Towards Goals: Initial  Interventions: CBT, DBT, Solution Focused,  Strength-based, Supportive, and Reframing  Therapist  Response: 1:30 pm - 1:50 pm: Cln led group focused on deep breathing. Group practiced diaphragmatic breathing through different methods and discussed the ways to utilize the different methods.  1:50 - 2:00 pm: Clinician led check-out. Clinician assessed for immediate needs, medication compliance and efficacy, and safety concerns?  Summary: 1:30 pm - 1:50 pm: Pt participated and engaged in discussion.  1:50 - 2:00 At check-out, patient contracts for safety.?Patient demonstrates progress as evidenced by her engagement and by being receptive to treatment. Patient denies SI/HI/self-harm thoughts at the end of group and agrees to seek help should those thoughts/feelings occur.?   Suicidal/Homicidal: Nowithout intent/plan  Plan: ?Pt will continue in PHP and medication management while continuing to work on decreasing depression symptoms,?SI, and anxiety symptoms,?and increasing the ability to self manage symptoms.     Collaboration of Care: Medication Management AEB T Lewis, NP  Patient/Guardian was advised Release of Information must be obtained prior to any record release in order to collaborate their care with an outside provider. Patient/Guardian was advised if they have not already done so to contact the registration department to sign all necessary forms in order for us  to release information regarding their care.   Consent: Patient/Guardian gives verbal consent for treatment and assignment of benefits for services provided during this visit. Patient/Guardian expressed understanding and agreed to proceed.   Diagnosis: Bipolar 1 disorder, depressed (HCC) [F31.9]    1. Bipolar 1 disorder, depressed (HCC)   2. PTSD (post-traumatic stress disorder)       Randall Bastos, LCSW

## 2023-12-11 NOTE — Psych (Signed)
 [facility] BH PHP THERAPIST PROGRESS NOTE  Payden Bonus [id number]   Session Time: 9:00 am - 10:00 am  Participation Level: Active  Behavioral Response: CasualAlertAnxious and Depressed  Type of Therapy: Group Therapy  Treatment Goals addressed: Coping  Progress Towards Goals: Initial  Interventions: CBT, DBT, Solution Focused, Strength-based, Supportive, and Reframing  Therapist Response: Clinician led check-in regarding current stressors and situation, and review of patient completed daily inventory. Clinician utilized active listening and empathetic response and validated patient emotions. Clinician facilitated processing group on pertinent issues.?   Summary: Patient arrived within time allowed. Patient rates their depression at a 3 and anxiety at a 2 on a scale of 1-10 with 10 being highest. Pt reports she slept 6 hours last night and ate 3x. Pt reports I'm alright today. Pt shares she spent time with a family she is close with and it made her happy to be around the children. Pt shares she set a boundary with the parents and was proud of herself. Pt states being able to see progress forward and hopes she can maintain a positive mindset. Pt able to process.?Pt engaged in discussion.?      Session Time: 10:00 am - 11:00 am  Participation Level: Active  Behavioral Response: CasualAlertAnxious and Depressed  Type of Therapy: Group Therapy  Treatment Goals addressed: Coping  Progress Towards Goals: Initial  Interventions: CBT, DBT, Solution Focused, Strength-based, Supportive, and Reframing  Therapist Response: Cln led discussion on reverting to old behaviors. Group members discussed ways in which they feel they have reverted currently or in the past. Group members report fear of reverting to old behaviors and they struggle to manage that fear. Cln informed discussion with CBT thought challenging and DBT distress tolerance skills.    Summary: Pt able to process and engage in  discussion.            Session Time: 11:00 am - 12:00 pm   Participation Level: Active   Behavioral Response: CasualAlertAnxious   Type of Therapy: Group Therapy   Treatment Goals addressed: Coping   Progress Towards Goals: Initial   Interventions: CBT, DBT, Solution Focused, Strength-based, Supportive, and Reframing   Therapist Response: Cln continued topic of boundaries and introduced how to set and maintain healthy boundaries. Cln utilized handout how to set boundaries and group members worked through examples to practice setting appropriate boundaries.    Summary: Pt engaged in discussion and is able to identify areas to work on.              Session Time: 12:00 pm - 1:00 pm   Participation Level: Active   Behavioral Response: CasualAlertAnxious   Type of Therapy: Group Therapy   Treatment Goals addressed: Coping   Progress Towards Goals: Initial   Interventions: CBT, DBT, Solution Focused, Strength-based, Supportive, and Reframing   Therapist Response: 12:00 - 12:50 Group was led by occupational therapist, Dallas Purpura.  12:50 - 1:00 pm: Clinician led check-out. Clinician assessed for immediate needs, medication compliance and efficacy, and safety concerns?   Summary: 12:00 pm - 12:50 pm: Pt  engaged in discussion.  12:50 - 1:00 At check-out, patient contracts for safety.?Patient demonstrates progress as evidenced by her engagement and by being receptive to treatment. Patient denies SI/HI/self-harm thoughts at the end of group and agrees to seek help should those thoughts/feelings occur.?   Suicidal/Homicidal: Nowithout intent/plan  Plan: ?Pt will continue in PHP and medication management while continuing to work on decreasing depression symptoms,?SI, and anxiety symptoms,?and increasing the ability  to self manage symptoms.     Collaboration of Care: Medication Management AEB T Lewis, NP  Patient/Guardian was advised Release of Information must be  obtained prior to any record release in order to collaborate their care with an outside provider. Patient/Guardian was advised if they have not already done so to contact the registration department to sign all necessary forms in order for us  to release information regarding their care.   Consent: Patient/Guardian gives verbal consent for treatment and assignment of benefits for services provided during this visit. Patient/Guardian expressed understanding and agreed to proceed.   Diagnosis: Bipolar 1 disorder, depressed (HCC) [F31.9]    1. Bipolar 1 disorder, depressed (HCC)   2. PTSD (post-traumatic stress disorder)       Randall Bastos, LCSW

## 2023-12-11 NOTE — Psych (Signed)
 [facility] BH PHP THERAPIST PROGRESS NOTE  Heidi Holland [id number]   Session Time: 9:00 am - 10:00 am  Participation Level: Active  Behavioral Response: CasualAlertAnxious and Depressed  Type of Therapy: Group Therapy  Treatment Goals addressed: Coping  Progress Towards Goals: Initial  Interventions: CBT, DBT, Solution Focused, Strength-based, Supportive, and Reframing  Therapist Response: Clinician led check-in regarding current stressors and situation, and review of patient completed daily inventory. Clinician utilized active listening and empathetic response and validated patient emotions. Clinician facilitated processing group on pertinent issues.?   Summary: Patient arrived within time allowed. Patient rates their depression at a 5 and anxiety at a 6 on a scale of 1-10 with 10 being highest. Pt reports she slept 4 hours last night and ate 3 meals. Pt reports she spiraled after watching the news and feeling out of control about what is happening in society. Pt reports she feels drained and fatigued due to the negativity and it is worsened by her chronic pain.  Pt able to process.?Pt engaged in discussion.?      Session Time: 10:00 am - 11:00 am  Participation Level: Active  Behavioral Response: CasualAlertAnxious and Depressed  Type of Therapy: Group Therapy  Treatment Goals addressed: Coping  Progress Towards Goals: Initial  Interventions: CBT, DBT, Solution Focused, Strength-based, Supportive, and Reframing  Therapist Response: Cln led processing group for pt's current struggles. Group members shared stressors and provided support and feedback. Cln brought in topics of boundaries, healthy relationships, and unhealthy thought processes to inform discussion.   Summary: Pt able to process and provide support to group.       Session Time: 11:00 am - 12:00 pm  Participation Level: Active  Behavioral Response: CasualAlertAnxious  Type of Therapy: Group  Therapy  Treatment Goals addressed: Coping  Progress Towards Goals: Initial  Interventions: CBT, DBT, Solution Focused, Strength-based, Supportive, and Reframing  Therapist Response: Cln introduced topic of boundaries. Cln discussed how boundaries inform our relationships and affect self-esteem and personal agency. Group discussed the three types of boundaries: rigid, porous, and healthy and when each type is most helpful/harmful.   Summary: Pt engaged in discussion and is able to identify each type in their life.        Session Time: 12:00 pm - 12:30 pm  Participation Level: Active  Behavioral Response: CasualAlertAnxious  Type of Therapy: Group Therapy  Treatment Goals addressed: Coping  Progress Towards Goals: Initial  Interventions: Psychologist, Occupational, Supportive  Therapist Response: Reflection Group: Patients encouraged to practice skills and interpersonal techniques or work on mindfulness and relaxation techniques. The importance of self-care and making skills part of a routine to increase usage were stressed.  Summary: Patient engaged and participated appropriately.      Session Time: 12:30 pm - 1:30 pm  Participation Level: Active  Behavioral Response: CasualAlertAnxious  Type of Therapy: Group Therapy  Treatment Goals addressed: Coping  Progress Towards Goals: Initial  Interventions: OT group  Therapist Response: Group was led by occupational therapist, Edward Hollan.   Summary: Pt engaged and participated in discussion.      Session Time: 1:30 pm - 2:00 pm  Participation Level: Active  Behavioral Response: CasualAlertAnxious  Type of Therapy: Group Therapy  Treatment Goals addressed: Coping  Progress Towards Goals: Initial  Interventions: CBT, DBT, Solution Focused, Strength-based, Supportive, and Reframing  Therapist Response: 1:30 pm - 1:50 pm: Cln led group focused on DBT mindfulness skills. Cln led activity utilizing  meditation labyrinths. Cln coached pt's on utilizing the how/what  mindfulness skills throughout activity.  1:50 - 2:00 pm: Clinician led check-out. Clinician assessed for immediate needs, medication compliance and efficacy, and safety concerns?  Summary: 1:30 pm - 1:50 pm: Pt participated and engaged in discussion.  1:50 - 2:00 At check-out, patient contracts for safety.?Patient demonstrates progress as evidenced by her engagement and by being receptive to treatment. Patient denies SI/HI/self-harm thoughts at the end of group and agrees to seek help should those thoughts/feelings occur.?   Suicidal/Homicidal: Nowithout intent/plan  Plan: ?Pt will continue in PHP and medication management while continuing to work on decreasing depression symptoms,?SI, and anxiety symptoms,?and increasing the ability to self manage symptoms.     Collaboration of Care: Medication Management AEB T Lewis, NP  Patient/Guardian was advised Release of Information must be obtained prior to any record release in order to collaborate their care with an outside provider. Patient/Guardian was advised if they have not already done so to contact the registration department to sign all necessary forms in order for us  to release information regarding their care.   Consent: Patient/Guardian gives verbal consent for treatment and assignment of benefits for services provided during this visit. Patient/Guardian expressed understanding and agreed to proceed.   Diagnosis: Bipolar 1 disorder, depressed (HCC) [F31.9]    1. Bipolar 1 disorder, depressed (HCC)   2. PTSD (post-traumatic stress disorder)       Randall Bastos, LCSW

## 2023-12-12 ENCOUNTER — Ambulatory Visit: Payer: Self-pay | Admitting: Physical Therapy

## 2023-12-12 ENCOUNTER — Other Ambulatory Visit: Payer: Self-pay

## 2023-12-13 NOTE — Psych (Signed)
 [facility] BH PHP THERAPIST PROGRESS NOTE  Onesti Bonfiglio [id number]   Session Time: 9:00 am - 10:00 am  Participation Level: Active  Behavioral Response: CasualAlertAnxious and Depressed  Type of Therapy: Group Therapy  Treatment Goals addressed: Coping  Progress Towards Goals: Initial  Interventions: CBT, DBT, Solution Focused, Strength-based, Supportive, and Reframing  Therapist Response: Clinician led check-in regarding current stressors and situation, and review of patient completed daily inventory. Clinician utilized active listening and empathetic response and validated patient emotions. Clinician facilitated processing group on pertinent issues.?   Summary: Patient arrived within time allowed. Patient rates their depression at a 6 and anxiety at a 6 on a scale of 1-10 with 10 being highest. Pt reports she slept 7 hours last night and ate 5x. Pt reports low energy, fatigue, pain, and hopelessness today. Pt states everything is a fight and she is tired of the cycle. Pt reports experiencing passive SI last night after feeling overwhelmed and unsupported. Pt states she had a biopsy yesterday and it returned pre-cancerous cells. Pt states she scheduled a support person to go with her but support person canceled due to their own health issue. Pt reports struggling with feeling she has no one who prioritizes her and missing her parents. Pt able to process.?Pt engaged in discussion.? Pt reports some alleviation of big feelings after processing.      Session Time: 10:00 am - 11:00 am  Participation Level: Active  Behavioral Response: CasualAlertAnxious and Depressed  Type of Therapy: Group Therapy  Treatment Goals addressed: Coping  Progress Towards Goals: Initial  Interventions: CBT, DBT, Solution Focused, Strength-based, Supportive, and Reframing  Therapist Response: Cln led processing group for pt's current struggles. Group members shared stressors and provided support and  feedback. Cln brought in topics of boundaries, healthy relationships, and unhealthy thought processes to inform discussion.   Summary: Pt able to process and provide support to group.       Session Time: 11:00 am - 12:00 pm  Participation Level: Active  Behavioral Response: CasualAlertAnxious  Type of Therapy: Group Therapy  Treatment Goals addressed: Coping  Progress Towards Goals: Progressing  Interventions: CBT, DBT, Solution Focused, Strength-based, Supportive, and Reframing  Therapist Response: Cln led discussion on extending grace and kindness to ourselves. Group discussed the messages they give themselves and how it impacts them. Cln discussed the best friend test as a way to calibrate whether we are being fair to ourselves or not and encouraged pt's to consider, would I believe this/say this about someone I cared about?    Summary:  Pt engaged in discussion and identifies that treating themselves with kindness is difficult.       Session Time: 12:00 pm - 12:30 pm  Participation Level: Active  Behavioral Response: CasualAlertAnxious  Type of Therapy: Group Therapy  Treatment Goals addressed: Coping  Progress Towards Goals: Progressing  Interventions: Psychologist, Occupational, Supportive  Therapist Response: Reflection Group: Patients encouraged to practice skills and interpersonal techniques or work on mindfulness and relaxation techniques. The importance of self-care and making skills part of a routine to increase usage were stressed.  Summary: Patient engaged and participated appropriately.      Session Time: 12:30 pm - 1:30 pm  Participation Level: Active  Behavioral Response: CasualAlertAnxious  Type of Therapy: Group Therapy  Treatment Goals addressed: Coping  Progress Towards Goals: Progressing  Interventions: OT group  Therapist Response: Group was led by occupational therapist, Edward Hollan.   Summary: Pt engaged and participated in  discussion.  Session Time: 1:30 pm - 2:00 pm  Participation Level: Active  Behavioral Response: CasualAlertAnxious  Type of Therapy: Group Therapy  Treatment Goals addressed: Coping  Progress Towards Goals: Progressing  Interventions: CBT, DBT, Solution Focused, Strength-based, Supportive, and Reframing  Therapist Response: 1:30 pm - 1:50 pm: Cln introduced Progressive Muscle Relaxation from the DBT TIPP skills and led group through practice of the skill.  1:50 - 2:00 pm: Clinician led check-out. Clinician assessed for immediate needs, medication compliance and efficacy, and safety concerns?  Summary: 1:30 pm - 1:50 pm: Pt participated and engaged in activity.  1:50 - 2:00 At check-out, patient contracts for safety.?Patient demonstrates progress as evidenced by her engagement and by being receptive to treatment. Patient denies SI/HI/self-harm thoughts at the end of group and agrees to seek help should those thoughts/feelings occur.?   Suicidal/Homicidal: Nowithout intent/plan  Plan: ?Pt will continue in PHP and medication management while continuing to work on decreasing depression symptoms,?SI, and anxiety symptoms,?and increasing the ability to self manage symptoms.     Collaboration of Care: Medication Management AEB T Lewis, NP  Patient/Guardian was advised Release of Information must be obtained prior to any record release in order to collaborate their care with an outside provider. Patient/Guardian was advised if they have not already done so to contact the registration department to sign all necessary forms in order for us  to release information regarding their care.   Consent: Patient/Guardian gives verbal consent for treatment and assignment of benefits for services provided during this visit. Patient/Guardian expressed understanding and agreed to proceed.   Diagnosis: Bipolar 1 disorder, depressed (HCC) [F31.9]    1. Bipolar 1 disorder, depressed (HCC)   2. PTSD  (post-traumatic stress disorder)       Randall Bastos, LCSW

## 2023-12-13 NOTE — Psych (Addendum)
 [facility] BH PHP THERAPIST PROGRESS NOTE  Heidi Holland [id number]   Session Time: 9:00 am - 10:00 am  Participation Level: Active  Behavioral Response: CasualAlertAnxious and Depressed  Type of Therapy: Group Therapy  Treatment Goals addressed: Coping  Progress Towards Goals: Initial  Interventions: CBT, DBT, Solution Focused, Strength-based, Supportive, and Reframing  Therapist Response: Clinician led check-in regarding current stressors and situation, and review of patient completed daily inventory. Clinician utilized active listening and empathetic response and validated patient emotions. Clinician facilitated processing group on pertinent issues.?   Summary: Patient arrived within time allowed. Patient rates their depression at a 5 and anxiety at a 7 on a scale of 1-10 with 10 being highest. Pt reports she slept 7 hours last night and ate 3x. Pt reports missing group yesterday due to pain and being really sick of the cycle. Pt states she spiraled after a conversation with her sister and then later the results of her biopsy hit MyChart and were listed as highly squamous. Pt states she has already messaged her provider about explaining the results and agrees that she doesn't have the full picture until a doctor interprets the results for her. Pt reports spiraling further about getting cancer and dying because everyone in my family has died from cancer. Pt shares she misses her parents and just wants a grown-up to help her and doesn't want to the grown-up anymore.  Pt able to process.?Pt engaged in discussion.? Pt reports some alleviation of big feelings after processing.      Session Time: 10:00 am - 11:00 am  Participation Level: Active  Behavioral Response: CasualAlertAnxious and Depressed  Type of Therapy: Group Therapy  Treatment Goals addressed: Coping  Progress Towards Goals: Initial  Interventions: CBT, DBT, Solution Focused, Strength-based, Supportive, and  Reframing  Therapist Response: Cln led discussion on family dynamics and the way in which they impact us . Group members shared struggles with their families and the way the patterns of behaviors have negatively impacted them. Cln provided space to process and validated pt's experiences.   Summary: Pt engaged in discussion and is able to identify current struggles in their family dynamics. Pt able to process.      Session Time: 11:00 am - 12:00 pm  Participation Level: Active  Behavioral Response: CasualAlertAnxious  Type of Therapy: Group Therapy  Treatment Goals addressed: Coping  Progress Towards Goals: Progressing  Interventions: CBT, DBT, Solution Focused, Strength-based, Supportive, and Reframing  Therapist Response: Cln introduced DBT distress tolerance distraction skills. Cln provides context for distraction skills and why distraction is a foundational way to manage mood dysregulation. Group discussed how to apply distraction.   Summary: Pt engaged in discussion and reports understanding of how distraction can be applied to manage big feelings.      Session Time: 12:00 pm - 1:00 pm  Participation Level: Active  Behavioral Response: CasualAlertAnxious  Type of Therapy: Group Therapy  Treatment Goals addressed: Coping  Progress Towards Goals: Progressing  Interventions: OT group  Therapist Response: 12:00 - 12:50 Group was led by occupational therapist, Dallas Purpura.  12:50 - 1:00 pm: Clinician led check-out. Clinician assessed for immediate needs, medication compliance and efficacy, and safety concerns?  Summary: 12:00 - 12:50 Pt engaged and participated in discussion. 12:50 - 1:00 At check-out, patient contracts for safety.?Patient demonstrates progress as evidenced by her engagement and by being receptive to treatment. Patient denies SI/HI/self-harm thoughts at the end of group and agrees to seek help should those thoughts/feelings occur.?  Suicidal/Homicidal: Nowithout intent/plan  Plan: ?Pt will continue in PHP and medication management while continuing to work on decreasing depression symptoms,?SI, and anxiety symptoms,?and increasing the ability to self manage symptoms.     Collaboration of Care: Medication Management AEB T Lewis, NP  Patient/Guardian was advised Release of Information must be obtained prior to any record release in order to collaborate their care with an outside provider. Patient/Guardian was advised if they have not already done so to contact the registration department to sign all necessary forms in order for us  to release information regarding their care.   Consent: Patient/Guardian gives verbal consent for treatment and assignment of benefits for services provided during this visit. Patient/Guardian expressed understanding and agreed to proceed.   Diagnosis: Bipolar 1 disorder, depressed (HCC) [F31.9]    1. Bipolar 1 disorder, depressed (HCC)   2. PTSD (post-traumatic stress disorder)       Randall Bastos, LCSW

## 2023-12-13 NOTE — Psych (Signed)
  [facility] BH PHP THERAPIST PROGRESS NOTE  Celie Desrochers [id number]   Session Time: 9:00 am - 10:00 am  Participation Level: Active  Behavioral Response: CasualAlertAnxious and Depressed  Type of Therapy: Group Therapy  Treatment Goals addressed: Coping  Progress Towards Goals: Initial  Interventions: CBT, DBT, Solution Focused, Strength-based, Supportive, and Reframing  Therapist Response: Clinician led check-in regarding current stressors and situation, and review of patient completed daily inventory. Clinician utilized active listening and empathetic response and validated patient emotions. Clinician facilitated processing group on pertinent issues.?   Summary: Patient arrived within time allowed. Patient rates their depression at a 7 and anxiety at a 5 on a scale of 1-10 with 10 being highest. Pt reports she slept on/off all day and ate 3x. Pt reports she is in a pain flare up and the past few days have been excruciating. Pt states she was unable to walk at all on Monday and called her sister for help. Pt reports feeling fatigued physically and frustrated mentally of the ongoing cycle of pain that is outside of her control.  Pt able to process.?Pt engaged in discussion.?      Session Time: 10:00 am - 11:00 am  Participation Level: Active  Behavioral Response: CasualAlertAnxious and Depressed  Type of Therapy: Group Therapy  Treatment Goals addressed: Coping  Progress Towards Goals: Initial  Interventions: CBT, DBT, Solution Focused, Strength-based, Supportive, and Reframing  Therapist Response: Cln led discussion on the power and control cycle, abuse, and the way abuse affects us . Group members shared struggles they have experienced with abusive or unhealthy dynamics in relationships and how it impacted them. Group member provided support to one another. Cln brought in the cycle of abuse, support, and thought challenging to inform discussion.   Summary: Pt engaged in  discussion and is able to process.   **Pt chose to leave group at 10 stating the pain was too uncomfortable to continue to sit in group. Pt called Medicaid transport to pick her up and states she plans to attempt rest when she gets home. Pt denies SI/HI before leaving group.    Suicidal/Homicidal: Nowithout intent/plan  Plan: ?Pt will continue in PHP and medication management while continuing to work on decreasing depression symptoms,?SI, and anxiety symptoms,?and increasing the ability to self manage symptoms.     Collaboration of Care: Medication Management AEB T Lewis, NP  Patient/Guardian was advised Release of Information must be obtained prior to any record release in order to collaborate their care with an outside provider. Patient/Guardian was advised if they have not already done so to contact the registration department to sign all necessary forms in order for us  to release information regarding their care.   Consent: Patient/Guardian gives verbal consent for treatment and assignment of benefits for services provided during this visit. Patient/Guardian expressed understanding and agreed to proceed.   Diagnosis: Bipolar 1 disorder, depressed (HCC) [F31.9]    1. Bipolar 1 disorder, depressed (HCC)   2. PTSD (post-traumatic stress disorder)       Randall Bastos, LCSW

## 2023-12-16 NOTE — Telephone Encounter (Signed)
 Please advsie

## 2023-12-17 MED ORDER — AZELASTINE HCL 0.1 % NA SOLN: 2.0000 | Freq: Two times a day (BID) | NASAL | 12 refills | Status: AC

## 2023-12-18 ENCOUNTER — Encounter: Payer: Self-pay | Admitting: Family Medicine

## 2023-12-18 ENCOUNTER — Other Ambulatory Visit: Payer: Self-pay | Admitting: *Deleted

## 2023-12-18 MED ORDER — TIRZEPATIDE 15 MG/0.5ML ~~LOC~~ SOAJ: 15.0000 mg | SUBCUTANEOUS | 1 refills | Status: DC

## 2023-12-22 ENCOUNTER — Ambulatory Visit (HOSPITAL_COMMUNITY)

## 2023-12-25 ENCOUNTER — Ambulatory Visit: Admitting: Family Medicine

## 2023-12-25 NOTE — Progress Notes (Deleted)
   LILLETTE Ileana Collet, PhD, LAT, ATC acting as a scribe for Artist Lloyd, MD.  Heidi Holland is a 48 y.o. female who presents to [facility] today for L foot pain. Pt was previously seen by Dr. Lloyd on 08/19/23 for R hand pain in the setting of a fibromyalgia flare.  Today, pt c/o L foot pain ongoing since early Oct. Pt was seen at the [facility] on the 9th. Pt locates pain to ***  Dx testing: 11/21/23 L foot XR  Pertinent review of systems: ***  Relevant historical information: ***   Exam:  There were no vitals taken for this visit. General: Well Developed, well nourished, and in no acute distress.   MSK: ***    Lab and Radiology Results No results found for this or any previous visit (from the past 72 hours). No results found.     Assessment and Plan: 48 y.o. female with ***   PDMP not reviewed this encounter. No orders of the defined types were placed in this encounter.  No orders of the defined types were placed in this encounter.    Discussed warning signs or symptoms. Please see discharge instructions. Patient expresses understanding.   ***

## 2023-12-26 ENCOUNTER — Other Ambulatory Visit

## 2023-12-30 ENCOUNTER — Ambulatory Visit: Admitting: Family Medicine

## 2023-12-30 ENCOUNTER — Ambulatory Visit: Admitting: Neurology

## 2024-01-02 ENCOUNTER — Ambulatory Visit: Admitting: Obstetrics and Gynecology

## 2024-01-02 ENCOUNTER — Ambulatory Visit: Admitting: Family Medicine

## 2024-01-03 ENCOUNTER — Encounter: Payer: Self-pay | Admitting: Family Medicine

## 2024-01-06 ENCOUNTER — Encounter: Payer: Self-pay | Admitting: Family Medicine

## 2024-01-07 ENCOUNTER — Encounter (HOSPITAL_BASED_OUTPATIENT_CLINIC_OR_DEPARTMENT_OTHER): Payer: Self-pay

## 2024-01-09 ENCOUNTER — Encounter: Payer: Self-pay | Admitting: Oncology

## 2024-01-13 ENCOUNTER — Other Ambulatory Visit

## 2024-01-13 ENCOUNTER — Encounter: Payer: Self-pay | Admitting: Oncology

## 2024-01-16 ENCOUNTER — Telehealth: Payer: Self-pay | Admitting: Family Medicine

## 2024-01-16 NOTE — Telephone Encounter (Signed)
 Spoke with patient stated doing better today has an appointment with her Psychiatrist. Requesting an appt with PCP due to hair loss  Appt schedule

## 2024-01-16 NOTE — Telephone Encounter (Signed)
 Patient called to reschedule missed appointments. Patient mentioned that she was going through some depression and has had suicidal thoughts in the last two weeks and was not able to make appointments due to this. Patient is rescheduled but I felt that Dr. Joane should know that this was mentioned in our phone call.

## 2024-01-16 NOTE — Telephone Encounter (Signed)
 Noted. Recommend that she follow up with us  or her psychiatrist ASAP.

## 2024-01-16 NOTE — Telephone Encounter (Signed)
 Messaged with Elora at PCP office to advise that I would be sending STAT message to her for PCP to address mental health concerns.

## 2024-01-21 ENCOUNTER — Ambulatory Visit: Payer: MEDICAID | Admitting: Family Medicine

## 2024-01-24 ENCOUNTER — Ambulatory Visit: Payer: MEDICAID | Admitting: Family Medicine

## 2024-02-01 ENCOUNTER — Other Ambulatory Visit: Payer: Self-pay | Admitting: Family Medicine

## 2024-02-08 ENCOUNTER — Other Ambulatory Visit: Payer: Self-pay | Admitting: Neurology

## 2024-02-08 DIAGNOSIS — B9689 Other specified bacterial agents as the cause of diseases classified elsewhere: Secondary | ICD-10-CM

## 2024-02-10 ENCOUNTER — Encounter: Payer: Self-pay | Admitting: Family Medicine

## 2024-02-10 ENCOUNTER — Other Ambulatory Visit: Payer: Self-pay | Admitting: *Deleted

## 2024-02-10 ENCOUNTER — Telehealth: Payer: Self-pay | Admitting: Obstetrics and Gynecology

## 2024-02-10 ENCOUNTER — Telehealth: Payer: Self-pay | Admitting: *Deleted

## 2024-02-10 MED ORDER — TIRZEPATIDE 12.5 MG/0.5ML ~~LOC~~ SOAJ: 12.5000 mg | SUBCUTANEOUS | 0 refills | Status: DC

## 2024-02-10 NOTE — Telephone Encounter (Signed)
 Patient is requesting to have something prescribed due to having anxiety. She is scheduled for a Leep on 02/17/2024.

## 2024-02-10 NOTE — Telephone Encounter (Signed)
 Ok to go back to 12.5 mg weekly.

## 2024-02-10 NOTE — Telephone Encounter (Signed)
 Please advise

## 2024-02-10 NOTE — Telephone Encounter (Signed)
 Ok with me. Please place any necessary orders.

## 2024-02-10 NOTE — Telephone Encounter (Signed)
 Copied from CRM #[id number]. Topic: Clinical - Prescription Issue >> Feb 10, 2024  8:20 AM Darshell M wrote: Reason for CRM: Patient was taking Mounjaro  15 mg but wants to go back to 12.5 due to side effects. Patient CB# [phone].  Please advise  Thandiwe Siragusa,RMA

## 2024-02-11 ENCOUNTER — Telehealth: Payer: Self-pay

## 2024-02-11 ENCOUNTER — Other Ambulatory Visit (HOSPITAL_COMMUNITY): Payer: Self-pay

## 2024-02-11 ENCOUNTER — Telehealth: Payer: Self-pay | Admitting: *Deleted

## 2024-02-11 NOTE — Telephone Encounter (Signed)
 Already done

## 2024-02-11 NOTE — Telephone Encounter (Signed)
 Please do PA for Mounjaro  12.5 mg. Pt has to decrease from 15 mg due to side effects.

## 2024-02-11 NOTE — Telephone Encounter (Signed)
 Pharmacy Patient Advocate Encounter   Received notification from Onbase that prior authorization for Mounjaro  12.5MG /0.5ML auto-injectors is required/requested.   Insurance verification completed.   The patient is insured through [facility] MEDICAID.   Per test claim: PA required; PA submitted to above mentioned insurance via Latent Key/confirmation #/EOC [id number] Status is pending

## 2024-02-12 ENCOUNTER — Other Ambulatory Visit (HOSPITAL_COMMUNITY): Payer: Self-pay

## 2024-02-12 ENCOUNTER — Encounter: Payer: Self-pay | Admitting: Oncology

## 2024-02-12 NOTE — Telephone Encounter (Signed)
 Pharmacy Patient Advocate Encounter  Received notification from [facility] MEDICAID that Prior Authorization for Mounjaro  12.5MG /0.5ML auto-injectors has been DENIED.  Full denial letter will be uploaded to the media tab. See denial reason below.   PA #/Case ID/Reference #: [id number]

## 2024-02-12 NOTE — Telephone Encounter (Signed)
 I informed insurance patient tried Ozempic  and trulicity however it was not enough. They want chart notes documenting why each med didn't work. I would need to resubmit once received.

## 2024-02-14 ENCOUNTER — Telehealth: Payer: Self-pay | Admitting: Family Medicine

## 2024-02-14 ENCOUNTER — Telehealth: Payer: Self-pay | Admitting: Neurology

## 2024-02-14 DIAGNOSIS — H8113 Benign paroxysmal vertigo, bilateral: Secondary | ICD-10-CM

## 2024-02-14 DIAGNOSIS — M2550 Pain in unspecified joint: Secondary | ICD-10-CM

## 2024-02-14 DIAGNOSIS — M797 Fibromyalgia: Secondary | ICD-10-CM

## 2024-02-14 DIAGNOSIS — R531 Weakness: Secondary | ICD-10-CM

## 2024-02-14 NOTE — Telephone Encounter (Signed)
 It would probably be easiest to have her schedule an appointment so that we can document fully why her previous medications were not successful.

## 2024-02-14 NOTE — Telephone Encounter (Signed)
 Please advise on tried and failed notes for ozempic  and trulicity as PA was recently denied for Wegovy  due to chart notes not expressing specific reasons for failed medications.  Called and notified patient. Patient verbalized understanding.

## 2024-02-14 NOTE — Telephone Encounter (Signed)
 Referral sent.  Per Dr.Jaffe, We can send to PT/vestibular rehab for vertigo

## 2024-02-14 NOTE — Telephone Encounter (Signed)
 Pt. Would like PT order to be sent again to previous PT agency for Vertigo. She did not provided name or location

## 2024-02-14 NOTE — Telephone Encounter (Signed)
 Pt is scheduled for 02/26/2024. Has not been able to do PT due to her flare ups and would like us  to resend the referral to [facility] for fibromyalgia so that she can get scheduled.

## 2024-02-17 ENCOUNTER — Telehealth: Payer: Self-pay | Admitting: Family Medicine

## 2024-02-17 ENCOUNTER — Ambulatory Visit: Admitting: Obstetrics and Gynecology

## 2024-02-17 NOTE — Telephone Encounter (Signed)
 Called pt and left detailed message about today's procedure being canceled and needing to get rescheduled due to LEEP machine being down. Left office number for pt to call and reschedule. Will also try calling again later.

## 2024-02-17 NOTE — Telephone Encounter (Signed)
 Please see recent MyChart message. Easiest would be for her to come in for an appointment.

## 2024-02-18 NOTE — Telephone Encounter (Signed)
 Patient has an OV on 02/19/2024 with PCP

## 2024-02-19 ENCOUNTER — Ambulatory Visit: Payer: MEDICAID | Admitting: Family Medicine

## 2024-02-20 ENCOUNTER — Other Ambulatory Visit: Payer: Self-pay

## 2024-02-20 NOTE — Addendum Note (Signed)
 Addended by: MARDY LEOTIS RAMAN on: 02/20/2024 09:23 AM   Modules accepted: Orders

## 2024-02-21 ENCOUNTER — Ambulatory Visit: Payer: MEDICAID | Admitting: Family Medicine

## 2024-02-24 ENCOUNTER — Telehealth: Payer: Self-pay | Admitting: Pharmacy Technician

## 2024-02-24 ENCOUNTER — Other Ambulatory Visit (HOSPITAL_COMMUNITY): Payer: Self-pay

## 2024-02-24 ENCOUNTER — Other Ambulatory Visit: Payer: Self-pay

## 2024-02-24 NOTE — Telephone Encounter (Signed)
 Note received from PA team and Pharmacy Patient has upcoming appointment on 1.16.26. [facility] is attempting to fill medication however there isn't in active insurance on file for pt. They are unable to fill for this patient.       Briant Viann HERO, CPhT to Rx Prior Auth Team     02/20/24 11:26 AM Hi Monchell, [facility] patient does not have active insurance on file, nothing comes up under eligibility.      Per patient she has Medicaid Trulium: [id number] T  Now.   Per patient she is unsure if she wants to  continue the Botox  anymore. Patient wants to know is there something else she can try or should she change her appt to Follow up to discuss.

## 2024-02-24 NOTE — Telephone Encounter (Signed)
 Patient advised of Dr.Jaffe note, f she would like to discontinue Botox , then I would recommend one of the other CGRP inhibitors (similar to Aimovig ). We can discuss at her appointment later this month.    Patient cancelled Botox  and will discuss further at there Follow up appt on 03/11/24.

## 2024-02-24 NOTE — Progress Notes (Signed)
 Patient has upcoming appointment on 1.16.26. [facility] is attempting to fill medication however there isn't in active insurance on file for pt. They are unable to fill for this patient.

## 2024-02-25 ENCOUNTER — Other Ambulatory Visit: Payer: Self-pay

## 2024-02-25 ENCOUNTER — Emergency Department (HOSPITAL_BASED_OUTPATIENT_CLINIC_OR_DEPARTMENT_OTHER): Payer: MEDICAID

## 2024-02-25 ENCOUNTER — Telehealth: Payer: Self-pay | Admitting: Family Medicine

## 2024-02-25 ENCOUNTER — Emergency Department (HOSPITAL_BASED_OUTPATIENT_CLINIC_OR_DEPARTMENT_OTHER)
Admission: EM | Admit: 2024-02-25 | Discharge: 2024-02-25 | Disposition: A | Discharge status: Home / Self Care | Payer: MEDICAID | Attending: Emergency Medicine | Admitting: Emergency Medicine

## 2024-02-25 ENCOUNTER — Encounter (HOSPITAL_BASED_OUTPATIENT_CLINIC_OR_DEPARTMENT_OTHER): Payer: Self-pay

## 2024-02-25 DIAGNOSIS — Z79899 Other long term (current) drug therapy: Secondary | ICD-10-CM | POA: Insufficient documentation

## 2024-02-25 DIAGNOSIS — E119 Type 2 diabetes mellitus without complications: Secondary | ICD-10-CM | POA: Insufficient documentation

## 2024-02-25 DIAGNOSIS — R1012 Left upper quadrant pain: Secondary | ICD-10-CM | POA: Insufficient documentation

## 2024-02-25 DIAGNOSIS — J45909 Unspecified asthma, uncomplicated: Secondary | ICD-10-CM | POA: Insufficient documentation

## 2024-02-25 DIAGNOSIS — E871 Hypo-osmolality and hyponatremia: Secondary | ICD-10-CM | POA: Diagnosis not present

## 2024-02-25 DIAGNOSIS — I10 Essential (primary) hypertension: Secondary | ICD-10-CM | POA: Diagnosis not present

## 2024-02-25 DIAGNOSIS — Z8739 Personal history of other diseases of the musculoskeletal system and connective tissue: Secondary | ICD-10-CM | POA: Diagnosis not present

## 2024-02-25 DIAGNOSIS — R1013 Epigastric pain: Secondary | ICD-10-CM | POA: Diagnosis present

## 2024-02-25 LAB — CBC WITH DIFFERENTIAL/PLATELET
Abs Immature Granulocytes: 0.01 K/uL (ref 0.00–0.07)
Basophils Absolute: 0 K/uL (ref 0.0–0.1)
Basophils Relative: 1 %
Eosinophils Absolute: 0.2 K/uL (ref 0.0–0.5)
Eosinophils Relative: 4 %
HCT: 34 % — ABNORMAL LOW (ref 36.0–46.0)
Hemoglobin: 11.7 g/dL — ABNORMAL LOW (ref 12.0–15.0)
Immature Granulocytes: 0 %
Lymphocytes Relative: 35 %
Lymphs Abs: 2 K/uL (ref 0.7–4.0)
MCH: 29.1 pg (ref 26.0–34.0)
MCHC: 34.4 g/dL (ref 30.0–36.0)
MCV: 84.6 fL (ref 80.0–100.0)
Monocytes Absolute: 0.5 K/uL (ref 0.1–1.0)
Monocytes Relative: 9 %
Neutro Abs: 3 K/uL (ref 1.7–7.7)
Neutrophils Relative %: 51 %
Platelets: 172 K/uL (ref 150–400)
RBC: 4.02 MIL/uL (ref 3.87–5.11)
RDW: 12.4 % (ref 11.5–15.5)
WBC: 5.7 K/uL (ref 4.0–10.5)
nRBC: 0 % (ref 0.0–0.2)

## 2024-02-25 LAB — COMPREHENSIVE METABOLIC PANEL WITH GFR
ALT: 19 U/L (ref 0–44)
AST: 14 U/L — ABNORMAL LOW (ref 15–41)
Albumin: 3.8 g/dL (ref 3.5–5.0)
Alkaline Phosphatase: 68 U/L (ref 38–126)
Anion gap: 9 (ref 5–15)
BUN: 10 mg/dL (ref 6–20)
CO2: 24 mmol/L (ref 22–32)
Calcium: 9.1 mg/dL (ref 8.9–10.3)
Chloride: 96 mmol/L — ABNORMAL LOW (ref 98–111)
Creatinine, Ser: 0.5 mg/dL (ref 0.44–1.00)
GFR, Estimated: >60 mL/min
Glucose, Bld: 90 mg/dL (ref 70–99)
Potassium: 4 mmol/L (ref 3.5–5.1)
Sodium: 129 mmol/L — ABNORMAL LOW (ref 135–145)
Total Bilirubin: 0.2 mg/dL (ref 0.0–1.2)
Total Protein: 6 g/dL — ABNORMAL LOW (ref 6.5–8.1)

## 2024-02-25 LAB — LIPASE, BLOOD: Lipase: 23 U/L (ref 11–51)

## 2024-02-25 LAB — URINALYSIS, ROUTINE W REFLEX MICROSCOPIC
Bilirubin Urine: NEGATIVE
Glucose, UA: NEGATIVE mg/dL
Hgb urine dipstick: NEGATIVE
Ketones, ur: NEGATIVE mg/dL
Leukocytes,Ua: NEGATIVE
Nitrite: NEGATIVE
Protein, ur: NEGATIVE mg/dL
Specific Gravity, Urine: 1.014 (ref 1.005–1.030)
pH: 6.5 (ref 5.0–8.0)

## 2024-02-25 LAB — PREGNANCY, URINE: Preg Test, Ur: NEGATIVE

## 2024-02-25 LAB — TROPONIN T, HIGH SENSITIVITY: Troponin T High Sensitivity: <15 ng/L (ref 0–19)

## 2024-02-25 MED ORDER — DICYCLOMINE HCL 10 MG/ML IM SOLN
20.0000 mg | Freq: Once | INTRAMUSCULAR | Status: AC
  Administered 2024-02-25: 20 mg via INTRAMUSCULAR
  Filled 2024-02-25: qty 2

## 2024-02-25 MED ORDER — LACTATED RINGERS IV BOLUS
1000.0000 mL | Freq: Once | INTRAVENOUS | Status: AC
  Administered 2024-02-25: 1000 mL via INTRAVENOUS

## 2024-02-25 MED ORDER — DICYCLOMINE HCL 20 MG PO TABS: 20.0000 mg | ORAL_TABLET | Freq: Two times a day (BID) | ORAL | 0 refills | Status: DC

## 2024-02-25 MED ORDER — KETOROLAC TROMETHAMINE 30 MG/ML IJ SOLN
30.0000 mg | Freq: Once | INTRAMUSCULAR | Status: AC
  Administered 2024-02-25: 30 mg via INTRAVENOUS
  Filled 2024-02-25: qty 1

## 2024-02-25 MED ORDER — OXYCODONE HCL 5 MG PO TABS
5.0000 mg | ORAL_TABLET | Freq: Once | ORAL | Status: AC
  Administered 2024-02-25: 5 mg via ORAL
  Filled 2024-02-25: qty 1

## 2024-02-25 MED ORDER — IOHEXOL 300 MG/ML  SOLN
100.0000 mL | Freq: Once | INTRAMUSCULAR | Status: AC | PRN
  Administered 2024-02-25: 100 mL via INTRAVENOUS

## 2024-02-25 MED ORDER — DEXAMETHASONE SOD PHOSPHATE PF 10 MG/ML IJ SOLN
10.0000 mg | Freq: Once | INTRAMUSCULAR | Status: AC
  Administered 2024-02-25: 10 mg via INTRAVENOUS
  Filled 2024-02-25: qty 1

## 2024-02-25 MED ORDER — ONDANSETRON 4 MG PO TBDP: 4.0000 mg | ORAL_TABLET | Freq: Three times a day (TID) | ORAL | 0 refills | Status: AC | PRN

## 2024-02-25 MED ORDER — ONDANSETRON HCL 4 MG/2ML IJ SOLN
4.0000 mg | Freq: Once | INTRAMUSCULAR | Status: AC
  Administered 2024-02-25: 4 mg via INTRAVENOUS
  Filled 2024-02-25: qty 2

## 2024-02-25 NOTE — ED Triage Notes (Signed)
 Possible fibromyalgia flare up, bone pain. Early onset arthritis as well. Abd pain onset 2 days ago and worsened. In so much pain and exhausted.

## 2024-02-25 NOTE — ED Notes (Signed)

## 2024-02-25 NOTE — Progress Notes (Signed)
 Patient is discontinuing Botox  at this time. Will discuss alternatives at upcoming appt on 03/11/24.

## 2024-02-25 NOTE — Telephone Encounter (Signed)
 Called patient & answered her questions regarding the LEEP and discussed ECC. Patient verbalized understanding & asked about pain relief options or anxiety relief options. Told patient she can take prescription dose of ibuprofen  at home before she comes in if she is able to take that medicine and if she has PRN anxiety medication she can take that as well as long as she is awake/alert enough to understand the procedure & give consent. Patient verbalized understanding.

## 2024-02-25 NOTE — Telephone Encounter (Signed)
 Dis enrolled pt from [facility] for Botox .

## 2024-02-25 NOTE — ED Provider Notes (Signed)
 " [place] EMERGENCY DEPARTMENT AT [facility] Provider Note   CSN: [id number] Arrival date & time: 02/25/24  9187     Patient presents with: Abdominal Pain   Heidi Holland is a 49 y.o. female.   HPI       49yo female with history of migraine without aura, hypertension, hyperlipidemia, type II DM, PTSD, anxiety, depression, chronic low back pain, hx of TIA, BPPV who presents with concern for body aches and abdominal pain.   History of fibromyalgia, thinks having flare for the last 2 weeks Now having abdominal pain Has taken some ibuprofen  , does not normally  Has not had pain like this before, feeling it left side w ith history of nerve damage on that side, hands swollen, ankles, sharp pains in bones of left leg-pain in tibia anterior Has been on monjouro for a year but was just canceled Mostly left sided shoulder and neck Some right hip Also has early arthritis, hx of hip replacement   Epigastric abdominal pain, a little distention, not as severe as it was, sharp pain, fairly constant, but shooting at times.  Nausea, no vomiting, chronic nausea, did vomit 2 days ago  No diarrhea or constipation Has not eaten much in last few days might be worse with eating Feels hot at night , sweats No urinary symptoms, was supposed to have LEEP for cervical lesion 1/5, due for menses in 1 week, breast tenderness Oxycarbamazepine new over last few weeks Stopped mounjaro , lost muscle relaxer, does have pain medication but trying not to use it, stopped the botox  for migraines  Have hx if diverticulosis, hernia repair, bowel removed and gastric bypass Past Medical History:  Diagnosis Date   Abnormal Pap smear of cervix    Allergy    Anxiety    Asthma    Bipolar 1 disorder (HCC)    Chronic fatigue    Chronic pain    CIN I (cervical intraepithelial neoplasia I) 03/03/2021   03/01/2021 - CIN I colpo, repeat cotesting in 1 year     Depression    Diabetes mellitus without  complication (HCC)    Diabetes mellitus, type II (HCC)    Diverticulitis    Fatty liver    per pt on 12/04/23   Fibromyalgia    GERD (gastroesophageal reflux disease)    HPV in female    Hyperlipidemia    Hypertension    Migraines    Osteoarthritis    Ovarian cyst    right   PCOS (polycystic ovarian syndrome)    PONV (postoperative nausea and vomiting)    Sleep apnea 2016   hx gastric bypass surgery   STD (sexually transmitted disease)    Trichomonas contact, treated    Vertigo      Prior to Admission medications  Medication Sig Start Date End Date Taking? Authorizing Provider  albuterol  (VENTOLIN  HFA) 108 (90 Base) MCG/ACT inhaler Inhale 2 puffs into the lungs every 6 (six) hours as needed for wheezing or shortness of breath. 12/09/23  Yes Kennyth Worth HERO, MD  azelastine  (ASTELIN ) 0.1 % nasal spray Place 2 sprays into both nostrils 2 (two) times daily. 12/17/23  Yes Kennyth Worth HERO, MD  clonazePAM  (KLONOPIN ) 0.5 MG tablet Take 0.5 mg by mouth 2 (two) times daily. 11/10/23  Yes [provider]  desonide  (DESOWEN ) 0.05 % cream Apply 1 Application topically 2 (two) times daily. 02/18/24  Yes [provider]  dicyclomine  (BENTYL ) 20 MG tablet Take 1 tablet (20 mg total) by  mouth 2 (two) times daily. 02/25/24  Yes Dreama Longs, MD  HYDROcodone -acetaminophen  (NORCO) 10-325 MG tablet Take 1 tablet by mouth every 6 (six) hours as needed.   Yes [provider]  loratadine (CLARITIN) 10 MG tablet Take 10 mg by mouth in the morning and at bedtime.   Yes [provider]  losartan  (COZAAR ) 50 MG tablet Take 1 tablet (50 mg total) by mouth daily. 10/28/23  Yes Kennyth Worth HERO, MD  lumateperone  tosylate (CAPLYTA ) 42 MG capsule Take 42 mg by mouth at bedtime.   Yes [provider]  methocarbamol  (ROBAXIN ) 750 MG tablet Take 750 mg by mouth 3 (three) times daily as needed. 11/02/23  Yes [provider]  ondansetron  (ZOFRAN -ODT) 4 MG disintegrating  tablet Take 1 tablet (4 mg total) by mouth every 8 (eight) hours as needed for nausea or vomiting. 02/25/24  Yes Dreama Longs, MD  pantoprazole  (PROTONIX ) 40 MG tablet Take 40 mg by mouth daily. 11/04/23  Yes [provider]  pregabalin  (LYRICA ) 200 MG capsule Take 1 capsule (200 mg total) by mouth 2 (two) times daily for 7 days. 10/23/23 02/25/24 Yes Kennyth Starleen RAMAN, MD  promethazine  (PHENERGAN ) 25 MG tablet TAKE 1 TABLET BY MOUTH EVERY 8 HOURS AS NEEDED FOR NAUSEA OR VOMITING. 02/03/24  Yes Kennyth Worth HERO, MD  QUEtiapine  (SEROQUEL ) 200 MG tablet Take 200 mg by mouth 2 (two) times daily. Patient taking differently: Take 200 mg by mouth at bedtime. 2 tabs at bedtime 11/05/23  Yes [provider]  rosuvastatin  (CRESTOR ) 5 MG tablet Take 5 mg by mouth daily. 10/23/23  Yes [provider]  SUMAtriptan  (IMITREX ) 100 MG tablet Take 100 mg by mouth every 2 (two) hours as needed for migraine. 11/02/23  Yes [provider]  tiZANidine  (ZANAFLEX ) 4 MG tablet Take 4 mg by mouth 3 (three) times daily. 02/21/24  Yes [provider]  botulinum toxin Type A  (BOTOX ) 200 units injection Inject 155 units IM into multiple site in the face,neck and head once every 90 days 11/13/23   Skeet Juliene SAUNDERS, DO  doxycycline  (VIBRA -TABS) 100 MG tablet Take 1 tablet (100 mg total) by mouth 2 (two) times daily. 12/09/23   Kennyth Worth HERO, MD  meclizine  (ANTIVERT ) 25 MG tablet Take 1 tablet (25 mg total) by mouth 3 (three) times daily as needed for dizziness. 12/06/23   Skeet Juliene SAUNDERS, DO  Oxcarbazepine (TRILEPTAL) 300 MG tablet Take 300 mg by mouth 2 (two) times daily.    [provider]  zolpidem  (AMBIEN ) 5 MG tablet Take 5 mg by mouth at bedtime as needed.    [provider]    Allergies: Ciprofloxacin and Wound dressing adhesive    Review of Systems  Updated Vital Signs BP 135/65   Pulse 62   Temp 97.9 F (36.6 C) (Oral)   Resp 18   LMP 02/04/2024 (Approximate)    SpO2 98%   Physical Exam Vitals and nursing note reviewed.  Constitutional:      General: She is not in acute distress.    Appearance: She is well-developed. She is not diaphoretic.  HENT:     Head: Normocephalic and atraumatic.  Eyes:     Conjunctiva/sclera: Conjunctivae normal.  Cardiovascular:     Rate and Rhythm: Normal rate and regular rhythm.     Heart sounds: Normal heart sounds.  Pulmonary:     Effort: Pulmonary effort is normal.  Abdominal:     General: There is no distension.  Palpations: Abdomen is soft.     Tenderness: There is abdominal tenderness in the epigastric area and left upper quadrant. There is no guarding.  Musculoskeletal:        General: No tenderness.     Cervical back: Normal range of motion.  Skin:    General: Skin is warm and dry.     Findings: No erythema or rash.  Neurological:     Mental Status: She is alert and oriented to person, place, and time.     (all labs ordered are listed, but only abnormal results are displayed) Labs Reviewed  CBC WITH DIFFERENTIAL/PLATELET - Abnormal; Notable for the following components:      Result Value   Hemoglobin 11.7 (*)    HCT 34.0 (*)    All other components within normal limits  COMPREHENSIVE METABOLIC PANEL WITH GFR - Abnormal; Notable for the following components:   Sodium 129 (*)    Chloride 96 (*)    Total Protein 6.0 (*)    AST 14 (*)    All other components within normal limits  LIPASE, BLOOD  PREGNANCY, URINE  URINALYSIS, ROUTINE W REFLEX MICROSCOPIC  TROPONIN T, HIGH SENSITIVITY  TROPONIN T, HIGH SENSITIVITY    EKG: EKG Interpretation Date/Time:  Tuesday February 25 2024 10:54:37 EST Ventricular Rate:  68 PR Interval:  169 QRS Duration:  88 QT Interval:  395 QTC Calculation: 421 R Axis:   58  Text Interpretation: Sinus rhythm Anterior infarct, old No significant change since last tracing Confirmed by Dreama Longs ([id number]) on 02/25/2024 11:03:43 AM  Radiology: CT ABDOMEN  PELVIS W CONTRAST Result Date: 02/25/2024 CLINICAL DATA:  Abdominal pain for 2 days. EXAM: CT ABDOMEN AND PELVIS WITH CONTRAST TECHNIQUE: Multidetector CT imaging of the abdomen and pelvis was performed using the standard protocol following bolus administration of intravenous contrast. RADIATION DOSE REDUCTION: This exam was performed according to the departmental dose-optimization program which includes automated exposure control, adjustment of the mA and/or kV according to patient size and/or use of iterative reconstruction technique. CONTRAST:  OMNIPAQUE  IOHEXOL  300 MG/ML  SOLN COMPARISON:  November 21, 2023 FINDINGS: Lower chest: No acute abnormality. Hepatobiliary: No focal liver abnormality is seen. No gallstones, gallbladder wall thickening, or biliary dilatation. Pancreas: Unremarkable. No pancreatic ductal dilatation or surrounding inflammatory changes. Spleen: Normal in size without focal abnormality. Adrenals/Urinary Tract: Adrenal glands appear normal. Right renal cyst is noted. No hydronephrosis or renal obstruction is noted. Urinary bladder is unremarkable. Stomach/Bowel: Status post gastric bypass. The appendix appears normal. There is no evidence of bowel obstruction or inflammation. Postsurgical changes are seen involving sigmoid colon. Vascular/Lymphatic: Aortic atherosclerosis. No enlarged abdominal or pelvic lymph nodes. Reproductive: Uterus and bilateral adnexa are unremarkable. Other: No abdominal wall hernia or abnormality. No abdominopelvic ascites. Musculoskeletal: Status post left total hip arthroplasty. No acute osseous abnormality is noted. IMPRESSION: 1. No acute abnormality seen in the abdomen or pelvis. 2. Aortic atherosclerosis. Aortic Atherosclerosis (ICD10-I70.0). Electronically Signed   By: Lynwood Landy Raddle M.D.   On: 02/25/2024 10:56     Procedures   Medications Ordered in the ED  lactated ringers  bolus 1,000 mL (0 mLs Intravenous Stopped 02/25/24 1118)  ondansetron   (ZOFRAN ) injection 4 mg (4 mg Intravenous Given 02/25/24 0946)  ketorolac  (TORADOL ) 30 MG/ML injection 30 mg (30 mg Intravenous Given 02/25/24 0951)  dicyclomine  (BENTYL ) injection 20 mg (20 mg Intramuscular Given 02/25/24 0948)  iohexol  (OMNIPAQUE ) 300 MG/ML solution 100 mL (100 mLs Intravenous Contrast Given 02/25/24 1036)  oxyCODONE  (Oxy IR/ROXICODONE ) immediate release tablet 5 mg (5 mg Oral Given 02/25/24 1100)  dexamethasone  (DECADRON ) injection 10 mg (10 mg Intravenous Given 02/25/24 1121)                                     48yo female with history of migraine without aura, hypertension, hyperlipidemia, type II DM, PTSD, anxiety, depression, chronic low back pain, hx of TIA, BPPV who presents with concern for body aches and abdominal pain.  DDx includes appendicitis, pancreatitis, cholecystitis, pyelonephritis, nephrolithiasis, diverticulitis, SBO, gastritis, PID, ovarian torsion, ectopic pregnancy, and tuboovarian abscess.  Has normal bilateral pulses lower extremities, dout acute arterial thrombus, pain present in tibia, no calf pain, doubt acute DVT. Doubt fracture.  Labs completed and personally about interpreted by me show mild anemia, no leukocytosis, moderate hyponatremia with a sodium of 129, no transaminitis or pancreatitis, negative pregnancy test, no sign of UTI, no sign of ACS.   CT abdomen pelvis shows no acute abnormalities in the abdomen or pelvis.  Given IV fluids, Zofran , Toradol  and Bentyl  and dose of oxycodone  with improvement of symptoms in ED.  Possible gastritis in setting of ibuprofen  use for fibromyalgia related pain.   Given decadron  for body aches, recommend increasing PPI for one week, given rx for bentyl  and zofran . Patient discharged in stable condition with understanding of reasons to return.      Final diagnoses:  Epigastric pain  History of fibromyalgia    ED Discharge Orders          Ordered    dicyclomine  (BENTYL ) 20 MG tablet  2 times daily         02/25/24 1115    ondansetron  (ZOFRAN -ODT) 4 MG disintegrating tablet  Every 8 hours PRN        02/25/24 1115               Dreama Longs, MD 02/25/24 2223  "

## 2024-02-25 NOTE — Discharge Instructions (Signed)
 For the next week, you may increase your pantoprazole  to 40mg  in AM, 40mg  in afternoon for 1 week then return to normal dosing.

## 2024-02-25 NOTE — Telephone Encounter (Signed)
 I reached out to this patient to reschedule her LEEP and ECC procedure with Dr.Bass that was canceled due to our LEEP machine being down. Patient was concerned about rescheduling at the end of February because this appointment has been rescheduled since November. I was able to get the patient an appointment in the beginning of February at the [facility] location. Patient has questions about the ECC procedure because she is not sure what it is and would like for a nurse to give her a call about it.

## 2024-02-26 ENCOUNTER — Other Ambulatory Visit (HOSPITAL_COMMUNITY): Payer: Self-pay

## 2024-02-26 ENCOUNTER — Ambulatory Visit: Payer: MEDICAID | Admitting: Family Medicine

## 2024-02-26 ENCOUNTER — Encounter: Payer: Self-pay | Admitting: Family Medicine

## 2024-02-26 ENCOUNTER — Other Ambulatory Visit: Payer: MEDICAID

## 2024-02-26 VITALS — BP 112/78 | HR 86 | Ht 62.0 in | Wt 191.0 lb

## 2024-02-26 DIAGNOSIS — M545 Low back pain, unspecified: Secondary | ICD-10-CM

## 2024-02-26 DIAGNOSIS — G8929 Other chronic pain: Secondary | ICD-10-CM

## 2024-02-26 DIAGNOSIS — M797 Fibromyalgia: Secondary | ICD-10-CM | POA: Diagnosis not present

## 2024-02-26 DIAGNOSIS — G5712 Meralgia paresthetica, left lower limb: Secondary | ICD-10-CM

## 2024-02-26 NOTE — Progress Notes (Signed)
 "        I, Leotis Batter, CMA acting as a scribe for Artist Lloyd, MD.  Heidi Holland is a 49 y.o. female who presents to [facility] today for f/u pain related to her fibromyalgia, L foot and R shoulder. Pt was last seen by Dr. Lloyd on 08/19/23.  Today, pt reports continued fibro pain, somewhat worse. Was seen in the ED for foot pain, had imaging done. Went to the ED again yesterday for uncontrolled pain. Right shoulder is still bothersome, but not as bad. Sx worse first thing in the mornings.   Pertinent review of systems: no fever or chills  Relevant historical information: HTN, DM, Fibromyalgia   Exam:  BP 112/78   Pulse 86   Ht 5' 2 (1.575 m)   Wt 191 lb (86.6 kg)   LMP 02/04/2024 (Approximate)   SpO2 97%   BMI 34.93 kg/m  General: Well Developed, well nourished, and in no acute distress.   MSK: Left foot: Normal appearing. TTP left plantar metatarsal heads 2 and 3.  Normal foot and ankle motion.   L-spine decreased lumbar motion.    Lab and Radiology Results  CT ABDOMEN PELVIS W CONTRAST Result Date: 02/25/2024 CLINICAL DATA:  Abdominal pain for 2 days. EXAM: CT ABDOMEN AND PELVIS WITH CONTRAST TECHNIQUE: Multidetector CT imaging of the abdomen and pelvis was performed using the standard protocol following bolus administration of intravenous contrast. RADIATION DOSE REDUCTION: This exam was performed according to the departmental dose-optimization program which includes automated exposure control, adjustment of the mA and/or kV according to patient size and/or use of iterative reconstruction technique. CONTRAST:  OMNIPAQUE  IOHEXOL  300 MG/ML  SOLN COMPARISON:  November 21, 2023 FINDINGS: Lower chest: No acute abnormality. Hepatobiliary: No focal liver abnormality is seen. No gallstones, gallbladder wall thickening, or biliary dilatation. Pancreas: Unremarkable. No pancreatic ductal dilatation or surrounding inflammatory changes. Spleen: Normal in  size without focal abnormality. Adrenals/Urinary Tract: Adrenal glands appear normal. Right renal cyst is noted. No hydronephrosis or renal obstruction is noted. Urinary bladder is unremarkable. Stomach/Bowel: Status post gastric bypass. The appendix appears normal. There is no evidence of bowel obstruction or inflammation. Postsurgical changes are seen involving sigmoid colon. Vascular/Lymphatic: Aortic atherosclerosis. No enlarged abdominal or pelvic lymph nodes. Reproductive: Uterus and bilateral adnexa are unremarkable. Other: No abdominal wall hernia or abnormality. No abdominopelvic ascites. Musculoskeletal: Status post left total hip arthroplasty. No acute osseous abnormality is noted. IMPRESSION: 1. No acute abnormality seen in the abdomen or pelvis. 2. Aortic atherosclerosis. Aortic Atherosclerosis (ICD10-I70.0). Electronically Signed   By: Lynwood Landy Raddle M.D.   On: 02/25/2024 10:56  I, Artist Lloyd, personally (independently) visualized and performed the interpretation of the images attached in this note. My personal interpretation of lumbar spine on CT scan abdomen and pelvis shows mild degenerative changes without acute fractures.     Assessment and Plan: 49 y.o. female with diffuse pain secondary to fibromyalgia flare.  She recently had temporarily pause her Mounjaro  which may be a fundamental cause of the worst flareup.  Getting back on that medicine should be helpful.  Continue Lyrica  for fibromyalgia pain.  She is scheduled to start physical therapy for more discrete orthopedic issues including back pain which I do think will be quite helpful.  Left foot pain is most consistent with metatarsalgia.  Plan to treat with metatarsal pads.   PDMP not reviewed this encounter. No orders of the defined types were placed in this encounter.  No orders of the defined types were placed in this encounter.    Discussed warning signs or symptoms. Please see discharge instructions. Patient  expresses understanding.   The above documentation has been reviewed and is accurate and complete Artist Lloyd, M.D.   "

## 2024-02-26 NOTE — Patient Instructions (Signed)
 Thank you for coming in today.   Metatarsalgia. Consider metatarsal pads for the shoe.   PT.   Monjaro and continued Lyrica .   Recheck in 2 months.

## 2024-02-27 ENCOUNTER — Ambulatory Visit: Payer: MEDICAID | Admitting: Family Medicine

## 2024-02-28 ENCOUNTER — Ambulatory Visit: Admitting: Neurology

## 2024-03-02 ENCOUNTER — Ambulatory Visit: Payer: MEDICAID | Attending: Neurology | Admitting: Physical Therapy

## 2024-03-02 NOTE — Therapy (Incomplete)
 " OUTPATIENT PHYSICAL THERAPY VESTIBULAR EVALUATION     Patient Name: Heidi Holland MRN: [id number] DOB:[birth year], 49 y.o., female Today's Date: 03/02/2024  END OF SESSION:   Past Medical History:  Diagnosis Date   Abnormal Pap smear of cervix    Allergy    Anxiety    Asthma    Bipolar 1 disorder (HCC)    Chronic fatigue    Chronic pain    CIN I (cervical intraepithelial neoplasia I) 03/03/2021   03/01/2021 - CIN I colpo, repeat cotesting in 1 year     Depression    Diabetes mellitus without complication (HCC)    Diabetes mellitus, type II (HCC)    Diverticulitis    Fatty liver    per pt on 12/04/23   Fibromyalgia    GERD (gastroesophageal reflux disease)    HPV in female    Hyperlipidemia    Hypertension    Migraines    Osteoarthritis    Ovarian cyst    right   PCOS (polycystic ovarian syndrome)    PONV (postoperative nausea and vomiting)    Sleep apnea 2016   hx gastric bypass surgery   STD (sexually transmitted disease)    Trichomonas contact, treated    Vertigo    Past Surgical History:  Procedure Laterality Date   BOWEL RESECTION     COLONOSCOPY  05/23/2022   COLPOSCOPY W/ BIOPSY / CURETTAGE  11/26/2023   -needs LEEP procedure per pt on 12/04/23   GASTRIC BYPASS  2016   KNEE ARTHROSCOPY Bilateral    RADIOLOGY WITH ANESTHESIA N/A 12/05/2023   Procedure: MRI WITH ANESTHESIA;  Surgeon: Radiologist, Medication, MD;  Location: [facility] OR;  Service: Radiology;  Laterality: N/A;  MRI BRAIN WITH AN WITHOUT CONTRAST   SHOULDER SURGERY Left    TOTAL HIP ARTHROPLASTY Left    UPPER GI ENDOSCOPY  05/23/2022   Patient Active Problem List   Diagnosis Date Noted   Dysplasia of cervix, high grade CIN 2 12/02/2023   History of abnormal mammogram 11/19/2023   PTSD (post-traumatic stress disorder) 10/30/2023   Generalized anxiety disorder 10/19/2023   Mood disorder 10/19/2023   Bipolar 1 disorder, mixed, severe (HCC) 10/18/2023   Meralgia paresthetica of left side  07/15/2023   Fibromyalgia 06/27/2023   Ovarian cyst 02/20/2023   Essential hypertension 02/20/2023   Bipolar 1 disorder, depressed (HCC) 02/16/2022   Paresthesia 09/13/2021   Abnormal Pap smear of cervix 10/24/2020   Family history of early CAD 10/24/2020   B12 deficiency 07/26/2020   Somatic dysfunction of spine, cervical 07/21/2020   Status post left hip replacement 03/12/2019   T2DM (type 2 diabetes mellitus) (HCC) 07/31/2018   Dyslipidemia associated with type 2 diabetes mellitus (HCC) 07/31/2018   Allergic rhinitis 07/31/2018   Mild intermittent asthma, uncomplicated 07/31/2018   GERD (gastroesophageal reflux disease) 07/31/2018   S/P gastric bypass 07/31/2018   Migraine 07/31/2018   Anxiety 07/31/2018   Depression, major, single episode, complete remission 07/31/2018   Insomnia 07/31/2018   Umbilical hernia 07/31/2018   Iron deficiency anemia 06/09/2018   Cervicogenic headache 05/12/2018   Low back pain 05/12/2018   Polyarthralgia 05/12/2018    PCP: Kennyth Worth HERO, MD  REFERRING PROVIDER: Skeet Juliene SAUNDERS, DO   REFERRING DIAG: [phone] (ICD-10-CM) - Benign paroxysmal positional vertigo due to bilateral vestibular disorder   THERAPY DIAG:  No diagnosis found.  ONSET DATE: 02/14/2024  Rationale for Evaluation and Treatment: Rehabilitation  SUBJECTIVE:   SUBJECTIVE STATEMENT: *** Pt accompanied by: {accompnied:27141}  PERTINENT HISTORY: migraine, BPPV,HTN, HLD, Type 2 DM, PTSD/anxiety/depression, chronic LBP, fibromyalgia  PAIN:  Are you having pain? {OPRCPAIN:27236}  PRECAUTIONS: {Therapy precautions:24002}  RED FLAGS: {PT Red Flags:29287}   WEIGHT BEARING RESTRICTIONS: {Yes ***/No:24003}  FALLS: Has patient fallen in last 6 months? {fallsyesno:27318}  LIVING ENVIRONMENT: Lives with: {OPRC lives with:25569::lives with their family} Lives in: {Lives in:25570} Stairs: {opstairs:27293} Has following equipment at home: {Assistive devices:23999}  PLOF:  {PLOF:24004}  PATIENT GOALS: ***  OBJECTIVE:  Note: Objective measures were completed at Evaluation unless otherwise noted.  DIAGNOSTIC FINDINGS: MRI 12/05/2023:  IMPRESSION: 1. No acute intracranial abnormality. 2. No abnormal enhancement. 3. Trace fluid in the left mastoid air cells.  COGNITION: Overall cognitive status: {cognition:24006}   SENSATION: {sensation:27233}  EDEMA:  {edema:24020}  MUSCLE TONE:  {LE tone:25568}  DTRs:  {DTR SITE:24025}  POSTURE:  {posture:25561}  Cervical ROM:    {AROM/PROM:27142} A/PROM (deg) eval  Flexion   Extension   Right lateral flexion   Left lateral flexion   Right rotation   Left rotation   (Blank rows = not tested)  STRENGTH: ***  LOWER EXTREMITY MMT:   MMT Right eval Left eval  Hip flexion    Hip abduction    Hip adduction    Hip internal rotation    Hip external rotation    Knee flexion    Knee extension    Ankle dorsiflexion    Ankle plantarflexion    Ankle inversion    Ankle eversion    (Blank rows = not tested)  BED MOBILITY:  {Bed mobility:24027}  TRANSFERS: Assistive device utilized: {Assistive devices:23999}  Sit to stand: {Levels of assistance:24026} Stand to sit: {Levels of assistance:24026} Chair to chair: {Levels of assistance:24026} Floor: {Levels of assistance:24026}  RAMP: {Levels of assistance:24026}  CURB: {Levels of assistance:24026}  GAIT: Gait pattern: {gait characteristics:25376} Distance walked: *** Assistive device utilized: {Assistive devices:23999} Level of assistance: {Levels of assistance:24026} Comments: ***  FUNCTIONAL TESTS:  {Functional tests:24029}  PATIENT SURVEYS:  {rehab surveys:24030}  VESTIBULAR ASSESSMENT:  GENERAL OBSERVATION: ***   SYMPTOM BEHAVIOR:  Subjective history: ***  Non-Vestibular symptoms: {nonvestibular symptoms:25260}  Type of dizziness: {Type of Dizziness:25255}  Frequency: ***  Duration: ***  Aggravating factors: {Aggravating  Factors:25258}  Relieving factors: {Relieving Factors:25259}  Progression of symptoms: {DESC; BETTER/WORSE:18575}  OCULOMOTOR EXAM:  Ocular Alignment: {Ocular Alignment:25262}  Ocular ROM: {RANGE OF MOTION:21649}  Spontaneous Nystagmus: {Spontaneous nystagmus:25263}  Gaze-Induced Nystagmus: {gaze-induced nystagmus:25264}  Smooth Pursuits: {smooth pursuit:25265}  Saccades: {saccades:25266}  Convergence/Divergence: *** cm   Cover-cross-cover test: {cover test:33756}  VESTIBULAR - OCULAR REFLEX:   Slow VOR: {slow VOR:25290}  VOR Cancellation: {vor cancellation:25291}  Head-Impulse Test: {head impulse test:25272}  Dynamic Visual Acuity: {dynamic visual acuity:25273}   POSITIONAL TESTING: {Positional tests:25271}  MOTION SENSITIVITY:  Motion Sensitivity Quotient Intensity: 0 = none, 1 = Lightheaded, 2 = Mild, 3 = Moderate, 4 = Severe, 5 = Vomiting  Intensity  1. Sitting to supine   2. Supine to L side   3. Supine to R side   4. Supine to sitting   5. L Hallpike-Dix   6. Up from L    7. R Hallpike-Dix   8. Up from R    9. Sitting, head tipped to L knee   10. Head up from L knee   11. Sitting, head tipped to R knee   12. Head up from R knee   13. Sitting head turns x5   14.Sitting head nods x5   15. In stance, 180  turn to L    16. In stance, 180 turn to R     OTHOSTATICS: {Exam; orthostatics:31331}  FUNCTIONAL GAIT: {Functional tests:24029}                                                                                                                             TREATMENT DATE: 03/02/2024   Canalith Repositioning:  {Canalith Repositioning:25283} Gaze Adaptation:  {gaze adaptation:25286} Habituation:  {habituation:25288} Other: ***  PATIENT EDUCATION: Education details: Eval results, POC *** Person educated: {Person educated:25204} Education method: {Education Method:25205} Education comprehension: {Education Comprehension:25206}  HOME EXERCISE  PROGRAM:  GOALS: Goals reviewed with patient? {yes/no:20286}  SHORT TERM GOALS: Target date: ***  *** Baseline: Goal status: {GOALSTATUS:25110}  2.  *** Baseline:  Goal status: {GOALSTATUS:25110}  3.  *** Baseline:  Goal status: {GOALSTATUS:25110}  4.  *** Baseline:  Goal status: {GOALSTATUS:25110}  5.  *** Baseline:  Goal status: {GOALSTATUS:25110}  6.  *** Baseline:  Goal status: {GOALSTATUS:25110}  LONG TERM GOALS: Target date: ***  *** Baseline:  Goal status: {GOALSTATUS:25110}  2.  *** Baseline:  Goal status: {GOALSTATUS:25110}  3.  *** Baseline:  Goal status: {GOALSTATUS:25110}  4.  *** Baseline:  Goal status: {GOALSTATUS:25110}  5.  *** Baseline:  Goal status: {GOALSTATUS:25110}  6.  *** Baseline:  Goal status: {GOALSTATUS:25110}  ASSESSMENT:  CLINICAL IMPRESSION: Patient is a 49 y.o. female who was seen today for physical therapy evaluation and treatment for BPPV.  ***  OBJECTIVE IMPAIRMENTS: {opptimpairments:25111}.   ACTIVITY LIMITATIONS: {activitylimitations:27494}  PARTICIPATION LIMITATIONS: {participationrestrictions:25113}  PERSONAL FACTORS: {Personal factors:25162} are also affecting patient's functional outcome.   REHAB POTENTIAL: {rehabpotential:25112}  CLINICAL DECISION MAKING: {clinical decision making:25114}  EVALUATION COMPLEXITY: {Evaluation complexity:25115}   PLAN:  PT FREQUENCY: {rehab frequency:25116}  PT DURATION: {rehab duration:25117}  PLANNED INTERVENTIONS: {rehab planned interventions:25118::97110-Therapeutic exercises,97530- Therapeutic [phone]- Neuromuscular re-education,97535- Self Rjmz,02859- Manual therapy,Patient/Family education}  PLAN FOR NEXT SESSION: PIERRETTE STARLET GREIG LELON., PT 03/02/2024, 12:29 PM   [facility] [address] [place], [place] [address] Phone # [phone] Fax # [phone]   Referring  diagnosis:  H81.13 (ICD-10-CM) - Benign paroxysmal positional vertigo due to bilateral vestibular disorder  Treatment diagnosis (if different than referring diagnosis): *** Date Symptoms Began: *** # of Visits requested: ***  Time period for Authorization: *** to ***  What was this (referring dx) caused by? []  Surgery []  Fall []  Ongoing issue []  Arthritis []  Other: ____________  Laterality: []  Rt []  Lt []  Both  Functional Tool & Score: ***  Check all possible CPT codes:     See Planned Interventions listed in the Plan section of the Evaluation.     If [employer]: Choose 10 or less codes  If [employer]: Modalities are not covered  If [employer]: Check allowed ICD code combinations   If [facility] Plan or [employer]: Cognitive training not covered  "

## 2024-03-03 ENCOUNTER — Ambulatory Visit: Payer: MEDICAID | Admitting: Family Medicine

## 2024-03-03 ENCOUNTER — Other Ambulatory Visit: Payer: Self-pay | Admitting: *Deleted

## 2024-03-03 ENCOUNTER — Telehealth: Payer: Self-pay

## 2024-03-03 MED ORDER — OZEMPIC (0.25 OR 0.5 MG/DOSE) 2 MG/3ML ~~LOC~~ SOPN: 0.2500 mg | PEN_INJECTOR | SUBCUTANEOUS | 0 refills | Status: DC

## 2024-03-03 NOTE — Telephone Encounter (Signed)
 Rx Ozempic  0.25 mg weekly send to pharmacy

## 2024-03-03 NOTE — Telephone Encounter (Signed)
 Ok to send in Ozempic 0.25 mg weekly

## 2024-03-03 NOTE — Telephone Encounter (Signed)
 Please advise on refill or if patient should wait until appointment on Friday.    Copied from CRM #[id number]. Topic: Clinical - Medication Question >> Mar 03, 2024 10:42 AM Mesmerise C wrote: Reason for CRM: Patient is inquiring if Dr. Kennyth can send in a prescription of ozempic  until her appt on Friday would like to be sent ot [facility] pharmacy on file

## 2024-03-04 ENCOUNTER — Encounter: Payer: Self-pay | Admitting: Neurology

## 2024-03-04 ENCOUNTER — Telehealth: Payer: Self-pay | Admitting: Pharmacy Technician

## 2024-03-04 ENCOUNTER — Telehealth: Payer: Self-pay

## 2024-03-04 ENCOUNTER — Encounter: Payer: Self-pay | Admitting: Oncology

## 2024-03-04 ENCOUNTER — Telehealth: Payer: MEDICAID | Admitting: Neurology

## 2024-03-04 ENCOUNTER — Telehealth: Payer: MEDICAID | Admitting: Physician Assistant

## 2024-03-04 ENCOUNTER — Ambulatory Visit: Admitting: Neurology

## 2024-03-04 ENCOUNTER — Other Ambulatory Visit (HOSPITAL_COMMUNITY): Payer: Self-pay

## 2024-03-04 DIAGNOSIS — T3695XA Adverse effect of unspecified systemic antibiotic, initial encounter: Secondary | ICD-10-CM | POA: Diagnosis not present

## 2024-03-04 DIAGNOSIS — B9689 Other specified bacterial agents as the cause of diseases classified elsewhere: Secondary | ICD-10-CM | POA: Diagnosis not present

## 2024-03-04 DIAGNOSIS — J019 Acute sinusitis, unspecified: Secondary | ICD-10-CM | POA: Diagnosis not present

## 2024-03-04 DIAGNOSIS — H8113 Benign paroxysmal vertigo, bilateral: Secondary | ICD-10-CM | POA: Diagnosis not present

## 2024-03-04 DIAGNOSIS — J4521 Mild intermittent asthma with (acute) exacerbation: Secondary | ICD-10-CM

## 2024-03-04 DIAGNOSIS — B379 Candidiasis, unspecified: Secondary | ICD-10-CM | POA: Diagnosis not present

## 2024-03-04 DIAGNOSIS — G43009 Migraine without aura, not intractable, without status migrainosus: Secondary | ICD-10-CM

## 2024-03-04 MED ORDER — FLUCONAZOLE 150 MG PO TABS: 150.0000 mg | ORAL_TABLET | ORAL | 0 refills | Status: DC | PRN

## 2024-03-04 MED ORDER — QULIPTA 60 MG PO TABS: 60.0000 mg | ORAL_TABLET | Freq: Every day | ORAL | 11 refills | Status: AC

## 2024-03-04 MED ORDER — PREDNISONE 20 MG PO TABS: 40.0000 mg | ORAL_TABLET | Freq: Every day | ORAL | 0 refills | Status: DC

## 2024-03-04 MED ORDER — AMOXICILLIN-POT CLAVULANATE 875-125 MG PO TABS: 1.0000 | ORAL_TABLET | Freq: Two times a day (BID) | ORAL | 0 refills | Status: DC

## 2024-03-04 NOTE — Progress Notes (Signed)
 " Virtual Visit Consent   [name], you are scheduled for a virtual visit with a [facility] provider today. Just as with appointments in the office, your consent must be obtained to participate. Your consent will be active for this visit and any virtual visit you may have with one of our providers in the next 365 days. If you have a MyChart account, a copy of this consent can be sent to you electronically.  As this is a virtual visit, video technology does not allow for your provider to perform a traditional examination. This may limit your provider's ability to fully assess your condition. If your provider identifies any concerns that need to be evaluated in person or the need to arrange testing (such as labs, EKG, etc.), we will make arrangements to do so. Although advances in technology are sophisticated, we cannot ensure that it will always work on either your end or our end. If the connection with a video visit is poor, the visit may have to be switched to a telephone visit. With either a video or telephone visit, we are not always able to ensure that we have a secure connection.  By engaging in this virtual visit, you consent to the provision of healthcare and authorize for your insurance to be billed (if applicable) for the services provided during this visit. Depending on your insurance coverage, you may receive a charge related to this service.  I need to obtain your verbal consent now. Are you willing to proceed with your visit today? Kassidee Narciso has provided verbal consent on 03/04/2024 for a virtual visit (video or telephone). Delon CHRISTELLA Dickinson, PA-C  Date: 03/04/2024 12:23 PM   Virtual Visit via Video Note   I, Delon CHRISTELLA Dickinson, connected with  Alexa Blish  ([id number], 15-May-1975) on 03/04/24 at 12:15 PM EST by a video-enabled telemedicine application and verified that I am speaking with the correct person using two identifiers.  Location: Patient: Virtual Visit Location  Patient: Home Provider: Virtual Visit Location Provider: Home Office   I discussed the limitations of evaluation and management by telemedicine and the availability of in person appointments. The patient expressed understanding and agreed to proceed.    History of Present Illness: Heidi Holland is a 49 y.o. who identifies as a female who was assigned female at birth, and is being seen today for sinus pain and congestion.  HPI: URI  This is a new problem. The current episode started 1 to 4 weeks ago (2 weeks). The problem has been gradually worsening. Maximum temperature: subjective off and on worse at night. Associated symptoms include congestion, coughing (mild), ear pain (bilateral), headaches, nausea, a plugged ear sensation, sinus pain, a sore throat (sore from drainage) and wheezing. Pertinent negatives include no diarrhea, rhinorrhea or vomiting. Associated symptoms comments: Post nasal drainage. She has tried inhaler use (sudafed, tylenol ) for the symptoms. The treatment provided no relief.    Problems:  Patient Active Problem List   Diagnosis Date Noted   Dysplasia of cervix, high grade CIN 2 12/02/2023   History of abnormal mammogram 11/19/2023   PTSD (post-traumatic stress disorder) 10/30/2023   Generalized anxiety disorder 10/19/2023   Mood disorder 10/19/2023   Bipolar 1 disorder, mixed, severe (HCC) 10/18/2023   Meralgia paresthetica of left side 07/15/2023   Fibromyalgia 06/27/2023   Ovarian cyst 02/20/2023   Essential hypertension 02/20/2023   Bipolar 1 disorder, depressed (HCC) 02/16/2022   Paresthesia 09/13/2021   Abnormal Pap smear of cervix 10/24/2020  Family history of early CAD 10/24/2020   B12 deficiency 07/26/2020   Somatic dysfunction of spine, cervical 07/21/2020   Status post left hip replacement 03/12/2019   T2DM (type 2 diabetes mellitus) (HCC) 07/31/2018   Dyslipidemia associated with type 2 diabetes mellitus (HCC) 07/31/2018   Allergic rhinitis  07/31/2018   Mild intermittent asthma, uncomplicated 07/31/2018   GERD (gastroesophageal reflux disease) 07/31/2018   S/P gastric bypass 07/31/2018   Migraine 07/31/2018   Anxiety 07/31/2018   Depression, major, single episode, complete remission 07/31/2018   Insomnia 07/31/2018   Umbilical hernia 07/31/2018   Iron deficiency anemia 06/09/2018   Cervicogenic headache 05/12/2018   Low back pain 05/12/2018   Polyarthralgia 05/12/2018    Allergies: Allergies[1] Medications: Current Medications[2]  Observations/Objective: Patient is well-developed, well-nourished in no acute distress.  Resting comfortably at home.  Head is normocephalic, atraumatic.  No labored breathing.  Speech is clear and coherent with logical content.  Patient is alert and oriented at baseline.    Assessment and Plan: 1. Acute bacterial sinusitis (Primary) - amoxicillin -clavulanate (AUGMENTIN ) 875-125 MG tablet; Take 1 tablet by mouth 2 (two) times daily.  Dispense: 14 tablet; Refill: 0  2. Mild intermittent asthma with exacerbation - predniSONE  (DELTASONE ) 20 MG tablet; Take 2 tablets (40 mg total) by mouth daily with breakfast.  Dispense: 10 tablet; Refill: 0  3. Antibiotic-induced yeast infection - fluconazole  (DIFLUCAN ) 150 MG tablet; Take 1 tablet (150 mg total) by mouth every 3 (three) days as needed.  Dispense: 2 tablet; Refill: 0  - Worsening symptoms that have not responded to OTC medications.  - Will give Augmentin  - Prednisone  added for asthma exacerbation - Continue allergy medications.  - Steam and humidifier can help - Stay well hydrated and get plenty of rest.  - Diflucan  given as prophylaxis as patient tends to get vaginal yeast infections with antibiotic use - Seek in person evaluation if no symptom improvement or if symptoms worsen   Follow Up Instructions: I discussed the assessment and treatment plan with the patient. The patient was provided an opportunity to ask questions and all  were answered. The patient agreed with the plan and demonstrated an understanding of the instructions.  A copy of instructions were sent to the patient via MyChart unless otherwise noted below.    The patient was advised to call back or seek an in-person evaluation if the symptoms worsen or if the condition fails to improve as anticipated.    Delon CHRISTELLA Dickinson, PA-C     [1]  Allergies Allergen Reactions   Ciprofloxacin Itching   Wound Dressing Adhesive Other (See Comments)    Red skin, eats into skin, becomes bubbly  [2]  Current Outpatient Medications:    amoxicillin -clavulanate (AUGMENTIN ) 875-125 MG tablet, Take 1 tablet by mouth 2 (two) times daily., Disp: 14 tablet, Rfl: 0   fluconazole  (DIFLUCAN ) 150 MG tablet, Take 1 tablet (150 mg total) by mouth every 3 (three) days as needed., Disp: 2 tablet, Rfl: 0   predniSONE  (DELTASONE ) 20 MG tablet, Take 2 tablets (40 mg total) by mouth daily with breakfast., Disp: 10 tablet, Rfl: 0   albuterol  (VENTOLIN  HFA) 108 (90 Base) MCG/ACT inhaler, Inhale 2 puffs into the lungs every 6 (six) hours as needed for wheezing or shortness of breath., Disp: 8 g, Rfl: 0   azelastine  (ASTELIN ) 0.1 % nasal spray, Place 2 sprays into both nostrils 2 (two) times daily., Disp: 30 mL, Rfl: 12   botulinum toxin Type A  (BOTOX ) 200 units  injection, Inject 155 units IM into multiple site in the face,neck and head once every 90 days, Disp: 1 each, Rfl: 4   clonazePAM  (KLONOPIN ) 0.5 MG tablet, Take 0.5 mg by mouth 2 (two) times daily., Disp: , Rfl:    desonide  (DESOWEN ) 0.05 % cream, Apply 1 Application topically 2 (two) times daily., Disp: , Rfl:    dicyclomine  (BENTYL ) 20 MG tablet, Take 1 tablet (20 mg total) by mouth 2 (two) times daily., Disp: 20 tablet, Rfl: 0   doxycycline  (VIBRA -TABS) 100 MG tablet, Take 1 tablet (100 mg total) by mouth 2 (two) times daily., Disp: 14 tablet, Rfl: 0   HYDROcodone -acetaminophen  (NORCO) 10-325 MG tablet, Take 1 tablet by mouth  every 6 (six) hours as needed., Disp: , Rfl:    loratadine (CLARITIN) 10 MG tablet, Take 10 mg by mouth in the morning and at bedtime., Disp: , Rfl:    losartan  (COZAAR ) 50 MG tablet, Take 1 tablet (50 mg total) by mouth daily., Disp: 90 tablet, Rfl: 1   lumateperone  tosylate (CAPLYTA ) 42 MG capsule, Take 42 mg by mouth at bedtime., Disp: , Rfl:    meclizine  (ANTIVERT ) 25 MG tablet, Take 1 tablet (25 mg total) by mouth 3 (three) times daily as needed for dizziness., Disp: 30 tablet, Rfl: 1   methocarbamol  (ROBAXIN ) 750 MG tablet, Take 750 mg by mouth 3 (three) times daily as needed., Disp: , Rfl:    ondansetron  (ZOFRAN -ODT) 4 MG disintegrating tablet, Take 1 tablet (4 mg total) by mouth every 8 (eight) hours as needed for nausea or vomiting., Disp: 20 tablet, Rfl: 0   Oxcarbazepine (TRILEPTAL) 300 MG tablet, Take 300 mg by mouth 2 (two) times daily., Disp: , Rfl:    pantoprazole  (PROTONIX ) 40 MG tablet, Take 40 mg by mouth daily., Disp: , Rfl:    pregabalin  (LYRICA ) 200 MG capsule, Take 1 capsule (200 mg total) by mouth 2 (two) times daily for 7 days., Disp: 14 capsule, Rfl: 0   promethazine  (PHENERGAN ) 25 MG tablet, TAKE 1 TABLET BY MOUTH EVERY 8 HOURS AS NEEDED FOR NAUSEA OR VOMITING., Disp: 20 tablet, Rfl: 0   QUEtiapine  (SEROQUEL ) 200 MG tablet, Take 200 mg by mouth 2 (two) times daily. (Patient taking differently: Take 200 mg by mouth at bedtime. 2 tabs at bedtime), Disp: , Rfl:    rosuvastatin  (CRESTOR ) 5 MG tablet, Take 5 mg by mouth daily., Disp: , Rfl:    Semaglutide ,0.25 or 0.5MG /DOS, (OZEMPIC , 0.25 OR 0.5 MG/DOSE,) 2 MG/3ML SOPN, Inject 0.25 mg into the skin once a week., Disp: 3 mL, Rfl: 0   SUMAtriptan  (IMITREX ) 100 MG tablet, Take 100 mg by mouth every 2 (two) hours as needed for migraine., Disp: , Rfl:    tiZANidine  (ZANAFLEX ) 4 MG tablet, Take 4 mg by mouth 3 (three) times daily., Disp: , Rfl:    zolpidem  (AMBIEN ) 5 MG tablet, Take 5 mg by mouth at bedtime as needed., Disp: , Rfl:    "

## 2024-03-04 NOTE — Patient Instructions (Signed)
 " Lauraine Riding, thank you for joining Delon CHRISTELLA Dickinson, PA-C for today's virtual visit.  While this provider is not your primary care provider (PCP), if your PCP is located in our provider database this encounter information will be shared with them immediately following your visit.   A [place] MyChart account gives you access to today's visit and all your visits, tests, and labs performed at [facility]  click here if you don't have a [place] MyChart account or go to [email or web address]  Consent: (Patient) Charlize Hathaway provided verbal consent for this virtual visit at the beginning of the encounter.  Current Medications:  Current Outpatient Medications:    amoxicillin -clavulanate (AUGMENTIN ) 875-125 MG tablet, Take 1 tablet by mouth 2 (two) times daily., Disp: 14 tablet, Rfl: 0   fluconazole  (DIFLUCAN ) 150 MG tablet, Take 1 tablet (150 mg total) by mouth every 3 (three) days as needed., Disp: 2 tablet, Rfl: 0   predniSONE  (DELTASONE ) 20 MG tablet, Take 2 tablets (40 mg total) by mouth daily with breakfast., Disp: 10 tablet, Rfl: 0   albuterol  (VENTOLIN  HFA) 108 (90 Base) MCG/ACT inhaler, Inhale 2 puffs into the lungs every 6 (six) hours as needed for wheezing or shortness of breath., Disp: 8 g, Rfl: 0   azelastine  (ASTELIN ) 0.1 % nasal spray, Place 2 sprays into both nostrils 2 (two) times daily., Disp: 30 mL, Rfl: 12   botulinum toxin Type A  (BOTOX ) 200 units injection, Inject 155 units IM into multiple site in the face,neck and head once every 90 days, Disp: 1 each, Rfl: 4   clonazePAM  (KLONOPIN ) 0.5 MG tablet, Take 0.5 mg by mouth 2 (two) times daily., Disp: , Rfl:    desonide  (DESOWEN ) 0.05 % cream, Apply 1 Application topically 2 (two) times daily., Disp: , Rfl:    dicyclomine  (BENTYL ) 20 MG tablet, Take 1 tablet (20 mg total) by mouth 2 (two) times daily., Disp: 20 tablet, Rfl: 0   doxycycline  (VIBRA -TABS) 100 MG tablet, Take 1 tablet (100 mg total) by mouth 2  (two) times daily., Disp: 14 tablet, Rfl: 0   HYDROcodone -acetaminophen  (NORCO) 10-325 MG tablet, Take 1 tablet by mouth every 6 (six) hours as needed., Disp: , Rfl:    loratadine (CLARITIN) 10 MG tablet, Take 10 mg by mouth in the morning and at bedtime., Disp: , Rfl:    losartan  (COZAAR ) 50 MG tablet, Take 1 tablet (50 mg total) by mouth daily., Disp: 90 tablet, Rfl: 1   lumateperone  tosylate (CAPLYTA ) 42 MG capsule, Take 42 mg by mouth at bedtime., Disp: , Rfl:    meclizine  (ANTIVERT ) 25 MG tablet, Take 1 tablet (25 mg total) by mouth 3 (three) times daily as needed for dizziness., Disp: 30 tablet, Rfl: 1   methocarbamol  (ROBAXIN ) 750 MG tablet, Take 750 mg by mouth 3 (three) times daily as needed., Disp: , Rfl:    ondansetron  (ZOFRAN -ODT) 4 MG disintegrating tablet, Take 1 tablet (4 mg total) by mouth every 8 (eight) hours as needed for nausea or vomiting., Disp: 20 tablet, Rfl: 0   Oxcarbazepine (TRILEPTAL) 300 MG tablet, Take 300 mg by mouth 2 (two) times daily., Disp: , Rfl:    pantoprazole  (PROTONIX ) 40 MG tablet, Take 40 mg by mouth daily., Disp: , Rfl:    pregabalin  (LYRICA ) 200 MG capsule, Take 1 capsule (200 mg total) by mouth 2 (two) times daily for 7 days., Disp: 14 capsule, Rfl: 0   promethazine  (PHENERGAN ) 25 MG tablet, TAKE 1 TABLET BY MOUTH EVERY  8 HOURS AS NEEDED FOR NAUSEA OR VOMITING., Disp: 20 tablet, Rfl: 0   QUEtiapine  (SEROQUEL ) 200 MG tablet, Take 200 mg by mouth 2 (two) times daily. (Patient taking differently: Take 200 mg by mouth at bedtime. 2 tabs at bedtime), Disp: , Rfl:    rosuvastatin  (CRESTOR ) 5 MG tablet, Take 5 mg by mouth daily., Disp: , Rfl:    Semaglutide ,0.25 or 0.5MG /DOS, (OZEMPIC , 0.25 OR 0.5 MG/DOSE,) 2 MG/3ML SOPN, Inject 0.25 mg into the skin once a week., Disp: 3 mL, Rfl: 0   SUMAtriptan  (IMITREX ) 100 MG tablet, Take 100 mg by mouth every 2 (two) hours as needed for migraine., Disp: , Rfl:    tiZANidine  (ZANAFLEX ) 4 MG tablet, Take 4 mg by mouth 3  (three) times daily., Disp: , Rfl:    zolpidem  (AMBIEN ) 5 MG tablet, Take 5 mg by mouth at bedtime as needed., Disp: , Rfl:    Medications ordered in this encounter:  Meds ordered this encounter  Medications   amoxicillin -clavulanate (AUGMENTIN ) 875-125 MG tablet    Sig: Take 1 tablet by mouth 2 (two) times daily.    Dispense:  14 tablet    Refill:  0    Supervising Provider:   BLAISE ALEENE KIDD [[id number]]   predniSONE  (DELTASONE ) 20 MG tablet    Sig: Take 2 tablets (40 mg total) by mouth daily with breakfast.    Dispense:  10 tablet    Refill:  0    Supervising Provider:   LAMPTEY, PHILIP O [[id number]]   fluconazole  (DIFLUCAN ) 150 MG tablet    Sig: Take 1 tablet (150 mg total) by mouth every 3 (three) days as needed.    Dispense:  2 tablet    Refill:  0    Supervising Provider:   LAMPTEY, PHILIP O [[id number]]     *If you need refills on other medications prior to your next appointment, please contact your pharmacy*  Follow-Up: Call back or seek an in-person evaluation if the symptoms worsen or if the condition fails to improve as anticipated.  [facility] [phone]  Other Instructions Sinus Infection, Adult A sinus infection, also called sinusitis, is inflammation of your sinuses. Sinuses are hollow spaces in the bones around your face. Your sinuses are located: Around your eyes. In the middle of your forehead. Behind your nose. In your cheekbones. Mucus normally drains out of your sinuses. When your nasal tissues become inflamed or swollen, mucus can become trapped or blocked. This allows bacteria, viruses, and fungi to grow, which leads to infection. Most infections of the sinuses are caused by a virus. A sinus infection can develop quickly. It can last for up to 4 weeks (acute) or for more than 12 weeks (chronic). A sinus infection often develops after a cold. What are the causes? This condition is caused by anything that creates swelling in the sinuses or  stops mucus from draining. This includes: Allergies. Asthma. Infection from bacteria or viruses. Deformities or blockages in your nose or sinuses. Abnormal growths in the nose (nasal polyps). Pollutants, such as chemicals or irritants in the air. Infection from fungi. This is rare. What increases the risk? You are more likely to develop this condition if you: Have a weak body defense system (immune system). Do a lot of swimming or diving. Overuse nasal sprays. Smoke. What are the signs or symptoms? The main symptoms of this condition are pain and a feeling of pressure around the affected sinuses. Other symptoms include: Stuffy nose or  congestion that makes it difficult to breathe through your nose. Thick yellow or greenish drainage from your nose. Tenderness, swelling, and warmth over the affected sinuses. A cough that may get worse at night. Decreased sense of smell and taste. Extra mucus that collects in the throat or the back of the nose (postnasal drip) causing a sore throat or bad breath. Tiredness (fatigue). Fever. How is this diagnosed? This condition is diagnosed based on: Your symptoms. Your medical history. A physical exam. Tests to find out if your condition is acute or chronic. This may include: Checking your nose for nasal polyps. Viewing your sinuses using a device that has a light (endoscope). Testing for allergies or bacteria. Imaging tests, such as an MRI or CT scan. In rare cases, a bone biopsy may be done to rule out more serious types of fungal sinus disease. How is this treated? Treatment for a sinus infection depends on the cause and whether your condition is chronic or acute. If caused by a virus, your symptoms should go away on their own within 10 days. You may be given medicines to relieve symptoms. They include: Medicines that shrink swollen nasal passages (decongestants). A spray that eases inflammation of the nostrils (topical intranasal  corticosteroids). Rinses that help get rid of thick mucus in your nose (nasal saline washes). Medicines that treat allergies (antihistamines). Over-the-counter pain relievers. If caused by bacteria, your health care provider may recommend waiting to see if your symptoms improve. Most bacterial infections will get better without antibiotic medicine. You may be given antibiotics if you have: A severe infection. A weak immune system. If caused by narrow nasal passages or nasal polyps, surgery may be needed. Follow these instructions at home: Medicines Take, use, or apply over-the-counter and prescription medicines only as told by your health care provider. These may include nasal sprays. If you were prescribed an antibiotic medicine, take it as told by your health care provider. Do not stop taking the antibiotic even if you start to feel better. Hydrate and humidify  Drink enough fluid to keep your urine pale yellow. Staying hydrated will help to thin your mucus. Use a cool mist humidifier to keep the humidity level in your home above 50%. Inhale steam for 10-15 minutes, 3-4 times a day, or as told by your health care provider. You can do this in the bathroom while a hot shower is running. Limit your exposure to cool or dry air. Rest Rest as much as possible. Sleep with your head raised (elevated). Make sure you get enough sleep each night. General instructions  Apply a warm, moist washcloth to your face 3-4 times a day or as told by your health care provider. This will help with discomfort. Use nasal saline washes as often as told by your health care provider. Wash your hands often with soap and water to reduce your exposure to germs. If soap and water are not available, use hand sanitizer. Do not smoke. Avoid being around people who are smoking (secondhand smoke). Keep all follow-up visits. This is important. Contact a health care provider if: You have a fever. Your symptoms get  worse. Your symptoms do not improve within 10 days. Get help right away if: You have a severe headache. You have persistent vomiting. You have severe pain or swelling around your face or eyes. You have vision problems. You develop confusion. Your neck is stiff. You have trouble breathing. These symptoms may be an emergency. Get help right away. Call 911. Do not  wait to see if the symptoms will go away. Do not drive yourself to the hospital. Summary A sinus infection is soreness and inflammation of your sinuses. Sinuses are hollow spaces in the bones around your face. This condition is caused by nasal tissues that become inflamed or swollen. The swelling traps or blocks the flow of mucus. This allows bacteria, viruses, and fungi to grow, which leads to infection. If you were prescribed an antibiotic medicine, take it as told by your health care provider. Do not stop taking the antibiotic even if you start to feel better. Keep all follow-up visits. This is important. This information is not intended to replace advice given to you by your health care provider. Make sure you discuss any questions you have with your health care provider. Document Revised: 01/03/2021 Document Reviewed: 01/03/2021 Elsevier Patient Education  2024 Elsevier Inc.   If you have been instructed to have an in-person evaluation today at a local Urgent Care facility, please use the link below. It will take you to a list of all of our available [facility], including address, phone number and hours of operation. Please do not delay care.  [facility]  If you or a family member do not have a primary care provider, use the link below to schedule a visit and establish care. When you choose a [facility] primary care physician or advanced practice provider, you gain a long-term partner in health. Find a Primary Care Provider  Learn more about [facility]'s in-office and virtual care options: [facility] - Get Care Now "

## 2024-03-04 NOTE — Patient Instructions (Signed)
 In addition to Botox , will try to start Qulipta  60mg  daily Instead of sumatriptan , try samples of Nurtec (1 tablet daily as needed).  Give me update.

## 2024-03-04 NOTE — Progress Notes (Signed)
 "  Virtual Visit via Video Note:   Consent was obtained for video visit:  Yes.   Answered questions that patient had about telehealth interaction:  Yes.   I discussed the limitations, risks, security and privacy concerns of performing an evaluation and management service by telemedicine. I also discussed with the patient that there may be a patient responsible charge related to this service. The patient expressed understanding and agreed to proceed.  Pt location: Home Physician Location: office Name of referring provider:  Kennyth Worth HERO, MD I connected with Heidi Holland at patients initiation/request on 03/04/2024 at  2:10 PM EST by video enabled telemedicine application and verified that I am speaking with the correct person using two identifiers. Pt MRN:  [id number] Pt DOB:  30-Dec-1975 Video Participants:  Heidi Holland  Assessment/Plan:   1. Migraine without aura, without status migrainosus, not intractable  2.  Benign paroxysmal positional vertigo.   For preventative management: She has had improvement on Botox  (no longer chronic) but still frequent.  I think we should continue Botox  but add Qulipta  60mg  daily to help optimize control of migraine frequency.  For abortive therapy:  Instead of sumatriptan , will have her come by office to try samples of Nurtec. Zofran  for nausea Limit use of pain relievers to no more than 9 days out of the month to prevent risk of rebound or medication-overuse headache. Headache diary Vestibular rehab Follow up in 6 months.  Total time in chart and face to face with patient:  40 minutes.  Subjective:  Heidi Holland is a 49 year old right-handed Caucasian woman with hypertension, hyperlipidemia, type 2 diabetes, PTSD/anxiety/depression, chronic back pain and history of TIA who follows up for migraines.   UPDATE: Migraines: For the last 2 Botox  treatments, she reports more breakthrough headaches.  Intensity:  moderate Duration:  within 30 minutes  after taking sumatriptan  but returns a few hours later. Frequency:  at least 2 days a week.    Vertigo: MRI of brain with and without contrast on 12/05/2023 personally reviewed showed trace fluid in the left mastoid air cells but otherwise unremarkable.  Referred to physical therapy for vestibular rehab.  She goes to the evaluation on Monday.   Bilateral leg cramps/left lumbar radiculopathy: Followed by Sports Medicine.  MRI of thoracic spine on 12/05/2023 personally reviewed was normal, supporting fibromyalgia as probable cause of back pain.     Current medications: Current NSAIDS/analgesics:  ASA 325mg , hydrocodone  Current triptans:  sumatriptan  100mg  Current ergotamine:  none Current anti-emetic:  Zofran  ODT 4mg , promethazine  25mg  Current muscle relaxants:  Robaxin  750mg  TID PRN, Tizanidine  4mg  PRN Current anti-anxiolytic:  Clonazepam  Current sleep aide:  trazodone  Current Antihypertensive medications:  losartan  Current Antidepressant medications:   Current Anticonvulsant medications:  Lyrica  200mg  BID, Trilepta 300mg  twice daily Current anti-CGRP: Current Vitamins/Herbal/Supplements:  none Current Antihistamines/Decongestants:  loratadine Other therapy:  Botox  Hormone/birth control:  none Other medications:  Ambien , quetiapine  200mg    Caffeine:  Caffeine-free soda, 1 cup of coffee 2-3 times a week. Alcohol:  no Smoker:  no Diet:  Needs to improve water intake.  Skips meals.  Diet Coke daily Exercise:  Not routine Depression:  Poor.  Currently treated; Anxiety:  Poor.  Currently treated. Other pain:  Low back pain/lumbar radiculopathy Sleep hygiene:  Improved on medication   HISTORY:  Migraines: Onset:  Age 25 Location: occiput into neck or top of head.   Quality:  Pressure, throbbing Initial intensity:  8/10.  She denies new headache, thunderclap headache  Aura:  no Premonitory Phase:  Wakes up with heavy eyelids and stiff neck. Postdrome:  hangover effect -  exhausted, irritable, neck soreness Associated symptoms:  Neck stiffness, nausea, sometimes vomiting, photophobia, phonophobia, blurred vision.  She denies associated unilateral numbness or weakness. Initial duration:  Usually 2-3 days (up to a week with various intensity) Initial frequency:  6 times a month (15 days or more a month) Initial frequency of abortive medication: something daily Triggers:  Lack of coffee, menstrual period, when low back pain aggravated, perfumes Relieving factors:  Ice pack, lay in dark Activity:  aggravates She has been to the ED on a couple of occasions for severe intractable migraine.  Vertigo: She reports dizziness beginning in 2024.  Describes it as a spinning sensation.  Occurs when she stands up or if she turns to either direction.  Lasts a few seconds.  Occurs several times a week.  Dix-Hallpike was positive.  Bilateral leg cramps/left lumbar radiculopathy: She has been diagnosed with fibromyalgia.  Since 2024, she has had cramps in her calves.  She also has numbness down the lateral left leg and a sharp pain in the center of her left foot.  She has received injections in the past.  Reports falls.  Prior lumbar X-ray showed mild facet arthropathy.  Labs include ferritin of 6 with low iron, B12 458, vit D 38, Mg 2.3, and negative CRP.  Scheduled for iron infusions.     Past medications: Past NSAIDS:  Ibuprofen , Aleve Past analgesics:  Excedrin, Tylenol , Fioricet tramadol  Past abortive triptans:   Tosymra  NS (effective but no longer covered by insurance),rizatriptan  10mg  Past abortive ergotamine:  none Past muscle relaxants:  Flexeril  Past anti-emetic:  Phenergan  Past antihypertensive medications:  a blood pressure medication Past antidepressant medications:  Effexor, Trintellix, Rexulti Past anticonvulsant medications:  topiramate, gabapentin , lamotrigine Past anti-CGRP:  Aimovig  Past vitamins/Herbal/Supplements:  none Past  antihistamines/decongestants:  none Other past therapies:  Botox  (effective)   She was told by her previous PCP that she had a transient ischemic attack in early 2019.  She was walking and her legs suddenly gave out and her arms weren't working.  She was on the floor for a few minutes.  No unilateral numbness or weakness.  When she got up, she called a friend who tested her on the phone for TIA and symptoms resolved.  She had an MRI and MRA of the brain about a month later, on 07/24/17, which report states were normal.  She was given a diagnosis of TIA and has since been on ASA 325mg  daily.  Her mother has a history of recurrent TIAs while she was treated for lung cancer.     Family history of headache:  no  Past Medical History: Past Medical History:  Diagnosis Date   Abnormal Pap smear of cervix    Allergy    Anxiety    Asthma    Bipolar 1 disorder (HCC)    Chronic fatigue    Chronic pain    CIN I (cervical intraepithelial neoplasia I) 03/03/2021   03/01/2021 - CIN I colpo, repeat cotesting in 1 year     Depression    Diabetes mellitus without complication (HCC)    Diabetes mellitus, type II (HCC)    Diverticulitis    Fatty liver    per pt on 12/04/23   Fibromyalgia    GERD (gastroesophageal reflux disease)    HPV in female    Hyperlipidemia    Hypertension    Migraines  Osteoarthritis    Ovarian cyst    right   PCOS (polycystic ovarian syndrome)    PONV (postoperative nausea and vomiting)    Sleep apnea 2016   hx gastric bypass surgery   STD (sexually transmitted disease)    Trichomonas contact, treated    Vertigo     Medications: Outpatient Encounter Medications as of 03/04/2024  Medication Sig   albuterol  (VENTOLIN  HFA) 108 (90 Base) MCG/ACT inhaler Inhale 2 puffs into the lungs every 6 (six) hours as needed for wheezing or shortness of breath.   azelastine  (ASTELIN ) 0.1 % nasal spray Place 2 sprays into both nostrils 2 (two) times daily.   botulinum toxin Type A   (BOTOX ) 200 units injection Inject 155 units IM into multiple site in the face,neck and head once every 90 days   clonazePAM  (KLONOPIN ) 0.5 MG tablet Take 0.5 mg by mouth 2 (two) times daily.   desonide  (DESOWEN ) 0.05 % cream Apply 1 Application topically 2 (two) times daily.   dicyclomine  (BENTYL ) 20 MG tablet Take 1 tablet (20 mg total) by mouth 2 (two) times daily.   doxycycline  (VIBRA -TABS) 100 MG tablet Take 1 tablet (100 mg total) by mouth 2 (two) times daily.   HYDROcodone -acetaminophen  (NORCO) 10-325 MG tablet Take 1 tablet by mouth every 6 (six) hours as needed.   loratadine (CLARITIN) 10 MG tablet Take 10 mg by mouth in the morning and at bedtime.   losartan  (COZAAR ) 50 MG tablet Take 1 tablet (50 mg total) by mouth daily.   lumateperone  tosylate (CAPLYTA ) 42 MG capsule Take 42 mg by mouth at bedtime.   meclizine  (ANTIVERT ) 25 MG tablet Take 1 tablet (25 mg total) by mouth 3 (three) times daily as needed for dizziness.   methocarbamol  (ROBAXIN ) 750 MG tablet Take 750 mg by mouth 3 (three) times daily as needed.   ondansetron  (ZOFRAN -ODT) 4 MG disintegrating tablet Take 1 tablet (4 mg total) by mouth every 8 (eight) hours as needed for nausea or vomiting.   Oxcarbazepine (TRILEPTAL) 300 MG tablet Take 300 mg by mouth 2 (two) times daily.   pantoprazole  (PROTONIX ) 40 MG tablet Take 40 mg by mouth daily.   pregabalin  (LYRICA ) 200 MG capsule Take 1 capsule (200 mg total) by mouth 2 (two) times daily for 7 days.   promethazine  (PHENERGAN ) 25 MG tablet TAKE 1 TABLET BY MOUTH EVERY 8 HOURS AS NEEDED FOR NAUSEA OR VOMITING.   QUEtiapine  (SEROQUEL ) 200 MG tablet Take 200 mg by mouth 2 (two) times daily. (Patient taking differently: Take 200 mg by mouth at bedtime. 2 tabs at bedtime)   rosuvastatin  (CRESTOR ) 5 MG tablet Take 5 mg by mouth daily.   Semaglutide ,0.25 or 0.5MG /DOS, (OZEMPIC , 0.25 OR 0.5 MG/DOSE,) 2 MG/3ML SOPN Inject 0.25 mg into the skin once a week.   SUMAtriptan  (IMITREX ) 100 MG  tablet Take 100 mg by mouth every 2 (two) hours as needed for migraine.   tiZANidine  (ZANAFLEX ) 4 MG tablet Take 4 mg by mouth 3 (three) times daily.   zolpidem  (AMBIEN ) 5 MG tablet Take 5 mg by mouth at bedtime as needed.   No facility-administered encounter medications on file as of 03/04/2024.    Allergies: Allergies[1]  Family History: Family History  Problem Relation Age of Onset   Stroke Mother    Heart disease Mother    Lung cancer Mother    Rheum arthritis Mother    Arthritis Mother    COPD Mother    Fibromyalgia Mother    Hypertension  Father    Diabetes Father    Heart disease Father    Hodgkin's lymphoma Father    Drug abuse Sister    Anxiety disorder Sister    Endometriosis Sister    Drug abuse Brother        died of overdose   Breast cancer Maternal Aunt    Breast cancer Paternal Aunt    Diabetes Maternal Grandmother    Lung cancer Maternal Grandmother    Other Maternal Grandmother        pace maker   Skin cancer Paternal Grandmother     Observations/Objective:   No acute distress.  Alert and oriented.  Speech fluent and not dysarthric.  Language intact.  Eyes orthophoric on primary gaze.  Face symmetric.   Follow up Instructions:      -I discussed the assessment and treatment plan with the patient. The patient was provided an opportunity to ask questions and all were answered. The patient agreed with the plan and demonstrated an understanding of the instructions.   The patient was advised to call back or seek an in-person evaluation if the symptoms worsen or if the condition fails to improve as anticipated.   Juliene Lamar Dunnings, DO     [1]  Allergies Allergen Reactions   Ciprofloxacin Itching   Wound Dressing Adhesive Other (See Comments)    Red skin, eats into skin, becomes bubbly   "

## 2024-03-04 NOTE — Telephone Encounter (Signed)
 PA has been submitted, and telephone encounter has been created. Please see telephone encounter dated 1.21.26.

## 2024-03-04 NOTE — Telephone Encounter (Signed)
 Pharmacy Patient Advocate Encounter   Received notification from [employer] that prior authorization for BOTOX  200 is required/requested.   Insurance verification completed.   The patient is insured through [facility] MEDICAID.   Per test claim: PA required; PA submitted to above mentioned insurance via [employer] Key/confirmation #/[id number] [facility] Status is pending

## 2024-03-04 NOTE — Telephone Encounter (Signed)
 Pa needed for Botox  patient to restart.

## 2024-03-05 NOTE — Telephone Encounter (Signed)
 Pharmacy Patient Advocate Encounter  Received notification from [facility] MEDICAID that Prior Authorization for BOTOX  100 has been DENIED.  See denial reason below. No denial letter attached in CMM. Will attach denial letter to Media tab once received.    PA #/Case ID/Reference #: [id number]

## 2024-03-06 ENCOUNTER — Other Ambulatory Visit (HOSPITAL_COMMUNITY): Payer: Self-pay

## 2024-03-06 ENCOUNTER — Telehealth: Payer: Self-pay

## 2024-03-06 ENCOUNTER — Ambulatory Visit: Payer: MEDICAID | Admitting: Family Medicine

## 2024-03-06 NOTE — Telephone Encounter (Signed)
 Pharmacy Patient Advocate Encounter   Received notification from [facility] KEY that prior authorization for Ozempic  (0.25 or 0.5 MG/DOSE) 2MG /3ML pen-injectors is required/requested.   Insurance verification completed.   The patient is insured through [facility] MEDICAID.   Per test claim: PA required; PA submitted to above mentioned insurance via Latent Key/confirmation #/EOC [id number] Status is pending

## 2024-03-06 NOTE — Telephone Encounter (Signed)
 Pharmacy Patient Advocate Encounter  Received notification from [facility] MEDICAID that Prior Authorization for  Ozempic  (0.25 or 0.5 MG/DOSE) 2MG /3ML pen-injector  has been APPROVED from 03/06/24 to 03/06/25. Ran test claim, Copay is $4.00. This test claim was processed through [facility]- copay amounts may vary at other pharmacies due to pharmacy/plan contracts, or as the patient moves through the different stages of their insurance plan.   PA #/Case ID/Reference #: [id number]

## 2024-03-09 NOTE — Telephone Encounter (Signed)
 LVM with information Ozempic  (0.25 or 0.5 MG/DOSE) 2MG /3ML pen-injector  has been APPROVED from 03/06/24 to 03/06/25. Ran test claim, Copay is $4.00.

## 2024-03-10 ENCOUNTER — Telehealth (INDEPENDENT_AMBULATORY_CARE_PROVIDER_SITE_OTHER): Payer: MEDICAID | Admitting: Family Medicine

## 2024-03-10 ENCOUNTER — Encounter: Payer: Self-pay | Admitting: Family Medicine

## 2024-03-10 VITALS — Ht 62.0 in | Wt 185.0 lb

## 2024-03-10 DIAGNOSIS — Z9884 Bariatric surgery status: Secondary | ICD-10-CM

## 2024-03-10 DIAGNOSIS — E119 Type 2 diabetes mellitus without complications: Secondary | ICD-10-CM | POA: Diagnosis not present

## 2024-03-10 DIAGNOSIS — E538 Deficiency of other specified B group vitamins: Secondary | ICD-10-CM

## 2024-03-10 DIAGNOSIS — G4700 Insomnia, unspecified: Secondary | ICD-10-CM

## 2024-03-10 DIAGNOSIS — M2550 Pain in unspecified joint: Secondary | ICD-10-CM | POA: Diagnosis not present

## 2024-03-10 MED ORDER — SEMAGLUTIDE (1 MG/DOSE) 4 MG/3ML ~~LOC~~ SOPN: 1.0000 mg | PEN_INJECTOR | SUBCUTANEOUS | 3 refills | Status: AC

## 2024-03-10 MED ORDER — QUETIAPINE FUMARATE 200 MG PO TABS: 400.0000 mg | ORAL_TABLET | Freq: Every day | ORAL | 0 refills | Status: AC

## 2024-03-10 MED ORDER — DICLOFENAC SODIUM 2 % EX SOLN: 2.0000 | Freq: Two times a day (BID) | CUTANEOUS | 5 refills | Status: AC | PRN

## 2024-03-10 NOTE — Assessment & Plan Note (Signed)
 Insurance no longer covers Mounjaro . We sent in an prescription for Ozempic  however it was at 0.25 mg weekly dose. She was previously on Mounjaro  15 mg weekly and wanting to decrease. We will switch to Ozempic  1 mg weekly. She will follow up with us  in a few weeks and we can adjust as needed. If she does not do well with Ozempic  we can try going back to Mounjaro . Check A1c with lab.

## 2024-03-10 NOTE — Assessment & Plan Note (Signed)
 This is typically managed by her psychiatrist but she has had difficulty getting in touch with them and needs refill on her seroquel . She is currently on 40 mg nightly and tolerating well. We will give emergency refill today though she will follow up with psychiatry as previously planned.

## 2024-03-10 NOTE — Assessment & Plan Note (Signed)
 Will check labs per patient request including inflammatory markers and tryptase to evaluate for mast cell activation syndrome. She would also like to try topical NSAID. She has previously tried Voltaren  with modest success. We will send in pennsaid  twice daily as needed.

## 2024-03-10 NOTE — Progress Notes (Signed)
 "  Heidi Holland is a 49 y.o. female who presents today for a virtual office visit.  Assessment/Plan:   Chronic Problems Addressed Today: T2DM (type 2 diabetes mellitus) (HCC) Insurance no longer covers Mounjaro . We sent in an prescription for Ozempic  however it was at 0.25 mg weekly dose. She was previously on Mounjaro  15 mg weekly and wanting to decrease. We will switch to Ozempic  1 mg weekly. She will follow up with us  in a few weeks and we can adjust as needed. If she does not do well with Ozempic  we can try going back to Mounjaro . Check A1c with lab.   Polyarthralgia Will check labs per patient request including inflammatory markers and tryptase to evaluate for mast cell activation syndrome. She would also like to try topical NSAID. She has previously tried Voltaren  with modest success. We will send in pennsaid  twice daily as needed.   Insomnia This is typically managed by her psychiatrist but she has had difficulty getting in touch with them and needs refill on her seroquel . She is currently on 40 mg nightly and tolerating well. We will give emergency refill today though she will follow up with psychiatry as previously planned.  B12 deficiency Check B12 with labs.   S/P gastric bypass Check labs including B12 vitamin D , CBC, cmet.      Subjective:  HPI:  See Assessment / plan for status of chronic conditions.   Discussed the use of AI scribe software for clinical note transcription with the patient, who gave verbal consent to proceed.  History of Present Illness Heidi Holland is a 49 year old female who presents with medication management issues and concerns about mast cell activation syndrome.  She has been experiencing issues with her medication management. She has been off Mounjaro  for about two weeks after being on a 15 mg dose, which she found too high. She intended to switch to a 12.5 mg dose but faced insurance denial. She has previously experienced nausea with  Ozempic .  She is exploring the possibility of having mast cell activation syndrome due to symptoms like intermittent itching, eczema spots, and episodes of hives and flushing.  She has been using a topical cream, Frotek, for joint pain relief, which she found helpful. She inquires about similar options and has used over-the-counter Voltaren  cream with some relief.  She is concerned about her Seroquel  prescription for sleep, as she lost a bottle and has about a week and a half left. She takes 400 mg at night and is seeking an emergency refill to cover until she can contact her psychiatrist.  She mentions a recent ER visit where her sodium and iron levels were low. She plans to see a specialist for her iron levels soon and is interested in checking her vitamin D  and inflammation markers.         Objective/Observations  Physical Exam: Gen: NAD, resting comfortably Pulm: Normal work of breathing Neuro: Grossly normal, moves all extremities Psych: Normal affect and thought content  Virtual Visit via Video   I connected with Heidi Holland on 03/10/24 at  2:00 PM EST by a video enabled telemedicine application and verified that I am speaking with the correct person using two identifiers. The limitations of evaluation and management by telemedicine and the availability of in person appointments were discussed. The patient expressed understanding and agreed to proceed.   Patient location: Home Provider location: Home Office Persons participating in the virtual visit: Myself and Patient     Worth HERO.  Kennyth, MD 03/10/2024 1:10 PM  "

## 2024-03-10 NOTE — Assessment & Plan Note (Signed)
 Check labs including B12 vitamin D , CBC, cmet.

## 2024-03-10 NOTE — Assessment & Plan Note (Signed)
 Check B12 with labs.

## 2024-03-11 ENCOUNTER — Telehealth: Payer: Self-pay | Admitting: *Deleted

## 2024-03-11 ENCOUNTER — Telehealth: Payer: Self-pay | Admitting: Pharmacist

## 2024-03-11 ENCOUNTER — Encounter: Payer: Self-pay | Admitting: Family Medicine

## 2024-03-11 ENCOUNTER — Ambulatory Visit: Admitting: Neurology

## 2024-03-11 ENCOUNTER — Other Ambulatory Visit: Payer: Self-pay | Admitting: Medical Genetics

## 2024-03-11 DIAGNOSIS — Z006 Encounter for examination for normal comparison and control in clinical research program: Secondary | ICD-10-CM

## 2024-03-11 NOTE — Telephone Encounter (Signed)
 Appeal has been submitted. Will advise when response is received, please be advised that most companies may take 30 days to make a decision. Appeal letter and supporting documentation have been faxed to [phone] on 03/11/2024 @3 :51 pm.  Thank you, Devere Pandy, PharmD Clinical Pharmacist  [place]  Direct Dial : [phone]

## 2024-03-11 NOTE — Telephone Encounter (Signed)
 Copied from CRM [id number]. Topic: Clinical - Medication Question >> Mar 11, 2024 12:27 PM Robinson H wrote: Reason for CRM: Was seen yesterday and forgot to ask provider about nebulizer treatments. Patient states she normally uses the albuterol  inhaler but with the weather she wants to have nebulizer treatments called in instead. No breathing issues at this time just with the weather, states provider hasn't prescribed before  Heidi Holland [phone]   Please advise  [facility]

## 2024-03-11 NOTE — Telephone Encounter (Signed)
 Duplicated

## 2024-03-12 ENCOUNTER — Other Ambulatory Visit: Payer: Self-pay | Admitting: *Deleted

## 2024-03-12 ENCOUNTER — Telehealth: Payer: Self-pay | Admitting: *Deleted

## 2024-03-12 DIAGNOSIS — J4520 Mild intermittent asthma, uncomplicated: Secondary | ICD-10-CM

## 2024-03-12 MED ORDER — PROMETHAZINE HCL 25 MG PO TABS: 25.0000 mg | ORAL_TABLET | Freq: Three times a day (TID) | ORAL | 0 refills | Status: AC | PRN

## 2024-03-12 MED ORDER — ALBUTEROL SULFATE (2.5 MG/3ML) 0.083% IN NEBU: 2.5000 mg | INHALATION_SOLUTION | Freq: Four times a day (QID) | RESPIRATORY_TRACT | 1 refills | Status: AC | PRN

## 2024-03-12 NOTE — Telephone Encounter (Signed)
 DME Faxed to [facility] [phone] Albuterol  send to pharmacy

## 2024-03-12 NOTE — Telephone Encounter (Signed)
 Insurance has approved the appeal for Botox  through 03/12/2025, full letter can be found under the media tab.    Thank you, Devere Pandy, PharmD Clinical Pharmacist  [place]  Direct Dial : [phone]

## 2024-03-12 NOTE — Telephone Encounter (Signed)
 Appeal request received by the insurance:

## 2024-03-12 NOTE — Telephone Encounter (Signed)
 Ok to send in DME order for nebulizer machine and albuterol  vials

## 2024-03-12 NOTE — Telephone Encounter (Signed)
 Copied from CRM [id number]. Topic: General - Call Back - No Documentation >> Mar 12, 2024  1:49 PM Rea C wrote: Reason for CRM: [facility] received an order for nebulizer machine for patient but did not receive office visit notes. The only notes they see is a telephone note in EPIC. They saw in the telephone notes that patient has access to a nebulizer machine and that patient is asking for nebulizer treatment. Pt has Medicaid Plan and they cant do medications for her. But, if provider/patient wants a machine, they need office visit notes from Dr. Kennyth.   Fax: [phone] Callback: [phone], option 1   [facility] would appreciate a callback just to know if they need to process an order for the machine or not.   Called [facility] notified patient want to cancelled the order for neu machine due to have and old one at home  [facility]

## 2024-03-13 ENCOUNTER — Telehealth: Payer: Self-pay

## 2024-03-13 ENCOUNTER — Other Ambulatory Visit (HOSPITAL_COMMUNITY): Payer: Self-pay

## 2024-03-13 NOTE — Telephone Encounter (Signed)
 Mychart message sent to patient.

## 2024-03-13 NOTE — Telephone Encounter (Signed)
 Pharmacy Patient Advocate Encounter   Received notification from [facility] KEY that prior authorization for Diclofenac  Sodium 2% solution is required/requested.   Insurance verification completed.   The patient is insured through [facility] MEDICAID.   Per test claim: PA required; PA submitted to above mentioned insurance via Latent Key/confirmation #/EOC [facility] Status is pending

## 2024-03-16 ENCOUNTER — Telehealth: Payer: Self-pay

## 2024-03-16 ENCOUNTER — Other Ambulatory Visit (HOSPITAL_COMMUNITY): Payer: Self-pay

## 2024-03-16 NOTE — Telephone Encounter (Signed)
 Per Dr. Autumn: okay to reschedule canceled appointments for 03/16/24 to 3-4 weeks out, Kindly call patient and reschedule.

## 2024-03-16 NOTE — Telephone Encounter (Signed)
 Pharmacy Patient Advocate Encounter  Received notification from [facility] MEDICAID that Prior Authorization for Diclofenac  Sodium 2% solution  has been DENIED.  Full denial letter will be uploaded to the media tab. See denial reason below.   PA #/Case ID/Reference #: [id number]

## 2024-03-16 NOTE — Telephone Encounter (Signed)
 Can be filled with [facility]

## 2024-03-17 ENCOUNTER — Other Ambulatory Visit

## 2024-03-17 ENCOUNTER — Other Ambulatory Visit: Payer: Self-pay | Admitting: Oncology

## 2024-03-17 ENCOUNTER — Other Ambulatory Visit: Payer: Self-pay

## 2024-03-17 ENCOUNTER — Other Ambulatory Visit: Payer: Self-pay | Admitting: Pharmacy Technician

## 2024-03-17 ENCOUNTER — Ambulatory Visit: Admitting: Oncology

## 2024-03-17 DIAGNOSIS — D509 Iron deficiency anemia, unspecified: Secondary | ICD-10-CM

## 2024-03-17 MED ORDER — ONABOTULINUMTOXINA 200 UNITS IJ SOLR
INTRAMUSCULAR | 4 refills | Status: AC
  Filled 2024-03-17: qty 1, fill #0

## 2024-03-17 NOTE — Telephone Encounter (Signed)
 Patient notified Rx Diclofenac  Sodium 2% solution  has been DENIED.

## 2024-03-18 ENCOUNTER — Telehealth: Payer: Self-pay

## 2024-03-18 ENCOUNTER — Ambulatory Visit: Payer: Self-pay | Admitting: Obstetrics and Gynecology

## 2024-03-18 ENCOUNTER — Telehealth: Payer: Self-pay | Admitting: Oncology

## 2024-03-18 ENCOUNTER — Other Ambulatory Visit (HOSPITAL_COMMUNITY)
Admission: RE | Admit: 2024-03-18 | Discharge: 2024-03-18 | Disposition: A | Payer: MEDICAID | Source: Ambulatory Visit | Attending: Obstetrics and Gynecology | Admitting: Obstetrics and Gynecology

## 2024-03-18 ENCOUNTER — Encounter: Payer: Self-pay | Admitting: Obstetrics and Gynecology

## 2024-03-18 VITALS — BP 136/80 | HR 83 | Ht 62.0 in | Wt 188.4 lb

## 2024-03-18 DIAGNOSIS — N871 Moderate cervical dysplasia: Secondary | ICD-10-CM

## 2024-03-18 LAB — POCT URINE PREGNANCY: Preg Test, Ur: NEGATIVE

## 2024-03-18 NOTE — Telephone Encounter (Signed)
 Spoke with patient and confirmed appointments for Friday and Saturday.

## 2024-03-18 NOTE — Progress Notes (Signed)
 Pt presents for LEEP. Consent signed. UPT negative

## 2024-03-18 NOTE — Progress Notes (Signed)
 LEEP procedure   Patient identified, informed consent obtained, signed copy in chart, time out performed.  Pap smear and colposcopy reviewed.   Pap : CIN 1 Colpo Biopsy : CIN 2 / HGSIL ECC neg Teflon coated speculum with smoke evacuator placed.  Cervix visualized. Paracervical block placed.  small size LOOP used to remove cone of cervix using blend of cut and cautery on LEEP machine.  Edges/Base cauterized with Ball.  Monsel's solution used for hemostasis.  Patient tolerated procedure well.  Patient given post procedure instructions.  Follow up in 1 month for LEEP follow up

## 2024-03-18 NOTE — Telephone Encounter (Signed)
 Unable to reach patient, left her a detailed voicemail message advising of Lab appointment on Friday, office visit and infusion on Saturday. Left call back number for questions.

## 2024-03-18 NOTE — Telephone Encounter (Signed)
 PT called back, confirmed appt dates and times with PT. PT will be here.

## 2024-03-19 ENCOUNTER — Other Ambulatory Visit (HOSPITAL_COMMUNITY): Payer: Self-pay

## 2024-03-19 ENCOUNTER — Ambulatory Visit: Payer: Self-pay | Admitting: Obstetrics and Gynecology

## 2024-03-19 LAB — SURGICAL PATHOLOGY

## 2024-03-20 ENCOUNTER — Other Ambulatory Visit: Payer: Self-pay | Admitting: Pharmacy Technician

## 2024-03-20 ENCOUNTER — Inpatient Hospital Stay: Payer: MEDICAID

## 2024-03-20 ENCOUNTER — Telehealth: Payer: Self-pay | Admitting: Oncology

## 2024-03-20 ENCOUNTER — Other Ambulatory Visit (HOSPITAL_COMMUNITY): Payer: Self-pay

## 2024-03-20 DIAGNOSIS — E538 Deficiency of other specified B group vitamins: Secondary | ICD-10-CM

## 2024-03-20 DIAGNOSIS — G8929 Other chronic pain: Secondary | ICD-10-CM

## 2024-03-20 DIAGNOSIS — D509 Iron deficiency anemia, unspecified: Secondary | ICD-10-CM

## 2024-03-20 LAB — CBC WITH DIFFERENTIAL (CANCER CENTER ONLY)
Abs Immature Granulocytes: 0.05 10*3/uL (ref 0.00–0.07)
Basophils Absolute: 0 10*3/uL (ref 0.0–0.1)
Basophils Relative: 0 %
Eosinophils Absolute: 0.2 10*3/uL (ref 0.0–0.5)
Eosinophils Relative: 2 %
HCT: 39.3 % (ref 36.0–46.0)
Hemoglobin: 13.3 g/dL (ref 12.0–15.0)
Immature Granulocytes: 1 %
Lymphocytes Relative: 19 %
Lymphs Abs: 1.8 10*3/uL (ref 0.7–4.0)
MCH: 28.8 pg (ref 26.0–34.0)
MCHC: 33.8 g/dL (ref 30.0–36.0)
MCV: 85.1 fL (ref 80.0–100.0)
Monocytes Absolute: 0.6 10*3/uL (ref 0.1–1.0)
Monocytes Relative: 7 %
Neutro Abs: 7 10*3/uL (ref 1.7–7.7)
Neutrophils Relative %: 71 %
Platelet Count: 214 10*3/uL (ref 150–400)
RBC: 4.62 MIL/uL (ref 3.87–5.11)
RDW: 12.6 % (ref 11.5–15.5)
WBC Count: 9.7 10*3/uL (ref 4.0–10.5)
nRBC: 0 % (ref 0.0–0.2)

## 2024-03-20 LAB — FERRITIN: Ferritin: 26 ng/mL (ref 11–307)

## 2024-03-20 LAB — FOLATE: Folate: 13.5 ng/mL

## 2024-03-20 LAB — IRON AND TIBC
Iron: 49 ug/dL (ref 28–170)
Saturation Ratios: 11 % (ref 10.4–31.8)
TIBC: 435 ug/dL (ref 250–450)
UIBC: 387 ug/dL

## 2024-03-20 LAB — VITAMIN D 25 HYDROXY (VIT D DEFICIENCY, FRACTURES): Vit D, 25-Hydroxy: 21.6 ng/mL — ABNORMAL LOW (ref 30–100)

## 2024-03-20 LAB — VITAMIN B12: Vitamin B-12: 590 pg/mL (ref 180–914)

## 2024-03-20 NOTE — Telephone Encounter (Signed)
 Pt would like to reschedule her appts with Dr. Autumn for 2/6 and 03/21/2024 due to pain and work on 03/21/24.

## 2024-03-20 NOTE — Therapy (Incomplete)
 " OUTPATIENT PHYSICAL THERAPY VESTIBULAR EVALUATION     Patient Name: Heidi Holland MRN: [id number] DOB:24-Mar-1975, 49 y.o., female Today's Date: 03/20/2024  END OF SESSION:   Past Medical History:  Diagnosis Date   Abnormal Pap smear of cervix    Allergy    Anxiety    Asthma    Bipolar 1 disorder (HCC)    Chronic fatigue    Chronic pain    CIN I (cervical intraepithelial neoplasia I) 03/03/2021   03/01/2021 - CIN I colpo, repeat cotesting in 1 year     Depression    Diabetes mellitus without complication (HCC)    Diabetes mellitus, type II (HCC)    Diverticulitis    Fatty liver    per pt on 12/04/23   Fibromyalgia    GERD (gastroesophageal reflux disease)    HPV in female    Hyperlipidemia    Hypertension    Migraines    Osteoarthritis    Ovarian cyst    right   PCOS (polycystic ovarian syndrome)    PONV (postoperative nausea and vomiting)    Sleep apnea 2016   hx gastric bypass surgery   STD (sexually transmitted disease)    Trichomonas contact, treated    Vertigo    Past Surgical History:  Procedure Laterality Date   BOWEL RESECTION     COLONOSCOPY  05/23/2022   COLPOSCOPY W/ BIOPSY / CURETTAGE  11/26/2023   -needs LEEP procedure per pt on 12/04/23   GASTRIC BYPASS  2016   KNEE ARTHROSCOPY Bilateral    RADIOLOGY WITH ANESTHESIA N/A 12/05/2023   Procedure: MRI WITH ANESTHESIA;  Surgeon: Radiologist, Medication, MD;  Location: [facility] OR;  Service: Radiology;  Laterality: N/A;  MRI BRAIN WITH AN WITHOUT CONTRAST   SHOULDER SURGERY Left    TOTAL HIP ARTHROPLASTY Left    UPPER GI ENDOSCOPY  05/23/2022   Patient Active Problem List   Diagnosis Date Noted   Dysplasia of cervix, high grade CIN 2 12/02/2023   History of abnormal mammogram 11/19/2023   PTSD (post-traumatic stress disorder) 10/30/2023   Generalized anxiety disorder 10/19/2023   Mood disorder 10/19/2023   Bipolar 1 disorder, mixed, severe (HCC) 10/18/2023   Meralgia paresthetica of left side  07/15/2023   Fibromyalgia 06/27/2023   Ovarian cyst 02/20/2023   Essential hypertension 02/20/2023   Myofascial pain 01/17/2023   Bipolar 1 disorder, depressed (HCC) 02/16/2022   Vitamin D  deficiency 02/16/2022   Paresthesia 09/13/2021   Lumbosacral radiculitis 04/21/2021   Abnormal Pap smear of cervix 10/24/2020   Family history of early CAD 10/24/2020   Lumbar radiculopathy 08/05/2020   B12 deficiency 07/26/2020   Somatic dysfunction of spine, cervical 07/21/2020   Vertigo 03/09/2020   Status post left hip replacement 03/12/2019   T2DM (type 2 diabetes mellitus) (HCC) 07/31/2018   Dyslipidemia associated with type 2 diabetes mellitus (HCC) 07/31/2018   Allergic rhinitis 07/31/2018   Mild intermittent asthma, uncomplicated 07/31/2018   GERD (gastroesophageal reflux disease) 07/31/2018   S/P gastric bypass 07/31/2018   Migraine 07/31/2018   Anxiety 07/31/2018   Depression, major, single episode, complete remission 07/31/2018   Insomnia 07/31/2018   Umbilical hernia 07/31/2018   Iron deficiency anemia 06/09/2018   Cervicogenic headache 05/12/2018   Low back pain 05/12/2018   Polyarthralgia 05/12/2018    PCP: Heidi Worth HERO, MD  REFERRING PROVIDER: Skeet Juliene SAUNDERS, DO   REFERRING DIAG: [phone] (ICD-10-CM) - Benign paroxysmal positional vertigo due to bilateral vestibular disorder  THERAPY DIAG:  No diagnosis  found.  ONSET DATE: ***  Rationale for Evaluation and Treatment: Rehabilitation  SUBJECTIVE:   SUBJECTIVE STATEMENT: *** Pt accompanied by: {accompnied:27141}  PERTINENT HISTORY: Anxiety, asthma, bipolar, chronic fatigue, DMII, fibromyalgia, HLD, HTN, migraines, vertigo, L THA  PAIN:  Are you having pain? {OPRCPAIN:27236}  PRECAUTIONS: {Therapy precautions:24002}  RED FLAGS: {PT Red Flags:29287}   WEIGHT BEARING RESTRICTIONS: {Yes ***/No:24003}  FALLS: Has patient fallen in last 6 months? {fallsyesno:27318}  LIVING ENVIRONMENT: Lives with: {OPRC lives  with:25569::lives with their family} Lives in: {Lives in:25570} Stairs: {opstairs:27293} Has following equipment at home: {Assistive devices:23999}  PLOF: {PLOF:24004}  PATIENT GOALS: ***  OBJECTIVE:  Note: Objective measures were completed at Evaluation unless otherwise noted.  DIAGNOSTIC FINDINGS:  12/05/23 brain MRI: No acute intracranial abnormality. 2. No abnormal enhancement. 3. Trace fluid in the left mastoid air cells.  12/05/23 thoracic MRI: Normal MRI of the thoracic spine 2. Bilateral pulmonary parenchymal disease consistent with pneumonia or atelectasis  COGNITION: Overall cognitive status: {cognition:24006}   SENSATION: {sensation:27233}  POSTURE:  {posture:25561}  GAIT: Gait pattern: {gait characteristics:25376} Distance walked: *** Assistive device utilized: {Assistive devices:23999} Level of assistance: {Levels of assistance:24026} Comments: ***  FUNCTIONAL TESTS:  {Functional tests:24029}  PATIENT SURVEYS:  DHI: ***  VESTIBULAR ASSESSMENT   GENERAL OBSERVATION: ***   OCULOMOTOR EXAM: {Ocular Alignment:32714:s}   Ocular ROM: {RANGE OF MOTION:21649}   Spontaneous Nystagmus: {Spontaneous nystagmus:25263}   Gaze-Induced Nystagmus: {gaze-induced nystagmus:25264}   Smooth Pursuits: {smooth pursuit:25265}   Saccades: {saccades:25266}   Convergence/Divergence: *** cm    VESTIBULAR - OCULAR REFLEX:    Slow VOR: {slow VOR:25290}   VOR Cancellation: {vor cancellation:25291}   Head-Impulse Test: {head impulse test:25272}   Dynamic Visual Acuity: {dynamic visual acuity:25273}  AUDITORY SCREEN:  Rinne Test {DESC;Negative/Positive:115700014}    POSITIONAL TESTING:  Right Roll Test: *** Left Roll Test: ***  Right Loaded Dix-Hallpike: *** Left Loaded Dix-Hallpike: ***  Right Sidelying: *** Left Sidelying: ***     MOTION SENSITIVITY:    Motion Sensitivity Quotient  Intensity: 0 = none, 1 = Lightheaded, 2 = Mild, 3 = Moderate, 4 =  Severe, 5 = Vomiting  Intensity  1. Sitting to supine   2. Supine to L side   3. Supine to R side   4. Supine to sitting   5. L Hallpike-Dix   6. Up from L    7. R Hallpike-Dix   8. Up from R    9. Sitting, head  tipped to L knee   10. Head up from L  knee   11. Sitting, head  tipped to R knee   12. Head up from R  knee   13. Sitting head turns x5   14.Sitting head nods x5   15. In stance, 180  turn to L    16. In stance, 180  turn to R        OTHOSTATICS: {Exam; orthostatics:31331}  FUNCTIONAL GAIT: {Functional tests:24029}  TREATMENT DATE: ***   Canalith Repositioning:  {Canalith Repositioning:25283} Gaze Adaptation:  {gaze adaptation:25286} Habituation:  {habituation:25288} Other: ***  PATIENT EDUCATION: Education details: *** Person educated: {Person educated:25204} Education method: {Education Method:25205} Education comprehension: {Education Comprehension:25206}  HOME EXERCISE PROGRAM:   GOALS: Goals reviewed with patient? {yes/no:20286}  SHORT TERM GOALS: Target date: {follow up:25551}  Patient to be independent with initial HEP. Baseline: HEP initiated Goal status: {GOALSTATUS:25110}    LONG TERM GOALS: Target date: {follow up:25551}  Patient to be independent with advanced HEP. Baseline: Not yet initiated  Goal status: {GOALSTATUS:25110}  Patient to report 0/10 dizziness with standing vertical and horizontal VOR for 30 seconds. Baseline: Unable Goal status: {GOALSTATUS:25110}  Patient will report 0/10 dizziness with bed mobility.  Baseline: Symptomatic  Goal status: {GOALSTATUS:25110}  Patient to demonstrate *** sway with M-CTSIB condition with eyes closed/foam surface in order to improve safety in environments with uneven surfaces and dim lighting. Baseline: *** Goal status: {GOALSTATUS:25110}  Patient  to score at least 20/24 on DGI in order to decrease risk of falls. Baseline: *** Goal status: {GOALSTATUS:25110}  Patient will ambulate over outdoor surfaces with LRAD while performing head turns to scan environment with good stability in order to indicate safe community mobility. Baseline: Unable Goal status: {GOALSTATUS:25110}  Patient to score at least 18 points less on DHI in order to meet MCID and improve functional outcomes.  Baseline: *** Goal status: {GOALSTATUS:25110}   ASSESSMENT:  CLINICAL IMPRESSION:  Patient is a 49 y/o F presenting to OPPT with c/o *** for the past ***  Patient today presenting with ***.    Patient was educated on gentle *** HEP and reported understanding. Prior to current episode, patient was independent. Would benefit from skilled PT services *** x/week for *** weeks to address aforementioned impairments in order to optimize level of function.    OBJECTIVE IMPAIRMENTS: {opptimpairments:25111}.   ACTIVITY LIMITATIONS: {activitylimitations:27494}  PARTICIPATION LIMITATIONS: {participationrestrictions:25113}  PERSONAL FACTORS: {Personal factors:25162} are also affecting patient's functional outcome.   REHAB POTENTIAL: {rehabpotential:25112}  CLINICAL DECISION MAKING: {clinical decision making:25114}  EVALUATION COMPLEXITY: {Evaluation complexity:25115}   PLAN:  PT FREQUENCY: {rehab frequency:25116}  PT DURATION: {rehab duration:25117}  PLANNED INTERVENTIONS: {rehab planned interventions:25118::97110-Therapeutic exercises,97530- Therapeutic [phone]- Neuromuscular re-education,97535- Self Rjmz,02859- Manual therapy,Patient/Family education}  PLAN FOR NEXT SESSION: PIERRETTE Slater MARLA Campbell, PT 03/20/2024, 8:39 AM  "

## 2024-03-21 ENCOUNTER — Inpatient Hospital Stay: Payer: MEDICAID | Admitting: Oncology

## 2024-03-21 ENCOUNTER — Inpatient Hospital Stay: Payer: MEDICAID

## 2024-03-23 ENCOUNTER — Ambulatory Visit: Payer: MEDICAID | Admitting: Physical Therapy

## 2024-03-26 ENCOUNTER — Inpatient Hospital Stay: Payer: MEDICAID

## 2024-03-27 ENCOUNTER — Ambulatory Visit: Payer: Self-pay | Admitting: Neurology

## 2024-03-31 ENCOUNTER — Ambulatory Visit: Admitting: Obstetrics and Gynecology

## 2024-04-02 ENCOUNTER — Inpatient Hospital Stay: Payer: MEDICAID

## 2024-04-09 ENCOUNTER — Inpatient Hospital Stay: Payer: MEDICAID

## 2024-04-15 ENCOUNTER — Ambulatory Visit: Payer: Self-pay | Admitting: Obstetrics and Gynecology

## 2024-04-16 ENCOUNTER — Inpatient Hospital Stay: Payer: MEDICAID
